# Patient Record
Sex: Female | Born: 1945 | Race: Black or African American | Hispanic: No | Smoking: Never smoker
Health system: Southern US, Community
[De-identification: ages and names within clinical notes are randomized; demographics above are authoritative.]

## PROBLEM LIST (undated history)

## (undated) DIAGNOSIS — N2581 Secondary hyperparathyroidism of renal origin: Secondary | ICD-10-CM

## (undated) DIAGNOSIS — M1990 Unspecified osteoarthritis, unspecified site: Secondary | ICD-10-CM

## (undated) DIAGNOSIS — K219 Gastro-esophageal reflux disease without esophagitis: Secondary | ICD-10-CM

## (undated) DIAGNOSIS — D649 Anemia, unspecified: Secondary | ICD-10-CM

## (undated) DIAGNOSIS — N183 Chronic kidney disease, stage 3 unspecified: Secondary | ICD-10-CM

## (undated) DIAGNOSIS — D631 Anemia in chronic kidney disease: Secondary | ICD-10-CM

## (undated) DIAGNOSIS — M8580 Other specified disorders of bone density and structure, unspecified site: Secondary | ICD-10-CM

## (undated) DIAGNOSIS — N289 Disorder of kidney and ureter, unspecified: Secondary | ICD-10-CM

## (undated) DIAGNOSIS — R1310 Dysphagia, unspecified: Secondary | ICD-10-CM

## (undated) HISTORY — DX: Other specified disorders of bone density and structure, unspecified site: M85.80

## (undated) HISTORY — PX: OTHER SURGICAL HISTORY: SHX169

## (undated) HISTORY — PX: ESOPHAGOGASTRODUODENOSCOPY (EGD) WITH ESOPHAGEAL DILATION: SHX5812

---

## 1999-02-18 ENCOUNTER — Other Ambulatory Visit: Admission: RE | Admit: 1999-02-18 | Discharge: 1999-02-18 | Payer: Self-pay | Admitting: Obstetrics and Gynecology

## 1999-04-01 ENCOUNTER — Ambulatory Visit (HOSPITAL_COMMUNITY): Admission: RE | Admit: 1999-04-01 | Discharge: 1999-04-01 | Payer: Self-pay | Admitting: *Deleted

## 1999-04-01 ENCOUNTER — Encounter: Payer: Self-pay | Admitting: *Deleted

## 1999-04-11 ENCOUNTER — Encounter: Payer: Self-pay | Admitting: Gastroenterology

## 1999-04-11 ENCOUNTER — Ambulatory Visit (HOSPITAL_COMMUNITY): Admission: RE | Admit: 1999-04-11 | Discharge: 1999-04-11 | Payer: Self-pay | Admitting: Gastroenterology

## 2000-01-11 ENCOUNTER — Ambulatory Visit (HOSPITAL_COMMUNITY): Admission: RE | Admit: 2000-01-11 | Discharge: 2000-01-11 | Payer: Self-pay | Admitting: General Surgery

## 2000-01-11 ENCOUNTER — Encounter (INDEPENDENT_AMBULATORY_CARE_PROVIDER_SITE_OTHER): Payer: Self-pay | Admitting: Specialist

## 2000-05-29 ENCOUNTER — Encounter (INDEPENDENT_AMBULATORY_CARE_PROVIDER_SITE_OTHER): Payer: Self-pay | Admitting: Specialist

## 2000-05-29 ENCOUNTER — Ambulatory Visit (HOSPITAL_COMMUNITY): Admission: RE | Admit: 2000-05-29 | Discharge: 2000-05-29 | Payer: Self-pay | Admitting: Gastroenterology

## 2000-07-02 ENCOUNTER — Encounter: Admission: RE | Admit: 2000-07-02 | Discharge: 2000-07-02 | Payer: Self-pay | Admitting: Gastroenterology

## 2000-07-02 ENCOUNTER — Encounter: Payer: Self-pay | Admitting: Gastroenterology

## 2000-11-20 ENCOUNTER — Other Ambulatory Visit: Admission: RE | Admit: 2000-11-20 | Discharge: 2000-11-20 | Payer: Self-pay | Admitting: Obstetrics and Gynecology

## 2006-02-28 ENCOUNTER — Emergency Department (HOSPITAL_COMMUNITY): Admission: EM | Admit: 2006-02-28 | Discharge: 2006-02-28 | Payer: Self-pay | Admitting: Family Medicine

## 2008-09-07 ENCOUNTER — Emergency Department (HOSPITAL_COMMUNITY): Admission: EM | Admit: 2008-09-07 | Discharge: 2008-09-07 | Payer: Self-pay | Admitting: Emergency Medicine

## 2009-10-14 ENCOUNTER — Ambulatory Visit: Payer: Self-pay | Admitting: Obstetrics and Gynecology

## 2009-10-14 ENCOUNTER — Other Ambulatory Visit: Admission: RE | Admit: 2009-10-14 | Discharge: 2009-10-14 | Payer: Self-pay | Admitting: Obstetrics and Gynecology

## 2009-10-15 ENCOUNTER — Encounter: Admission: RE | Admit: 2009-10-15 | Discharge: 2009-10-15 | Payer: Self-pay | Admitting: Obstetrics and Gynecology

## 2009-10-22 ENCOUNTER — Ambulatory Visit: Payer: Self-pay | Admitting: Obstetrics and Gynecology

## 2009-10-26 ENCOUNTER — Ambulatory Visit: Payer: Self-pay | Admitting: Obstetrics and Gynecology

## 2009-11-10 ENCOUNTER — Ambulatory Visit: Payer: Self-pay | Admitting: Obstetrics and Gynecology

## 2009-11-16 ENCOUNTER — Ambulatory Visit: Payer: Self-pay | Admitting: Obstetrics and Gynecology

## 2010-01-11 ENCOUNTER — Ambulatory Visit: Payer: Self-pay | Admitting: Hematology and Oncology

## 2010-01-25 LAB — CBC & DIFF AND RETIC
BASO%: 0 % (ref 0.0–2.0)
Basophils Absolute: 0 10*3/uL (ref 0.0–0.1)
EOS%: 3.8 % (ref 0.0–7.0)
Eosinophils Absolute: 0.1 10*3/uL (ref 0.0–0.5)
HCT: 32.3 % — ABNORMAL LOW (ref 34.8–46.6)
HGB: 10.8 g/dL — ABNORMAL LOW (ref 11.6–15.9)
Immature Retic Fract: 2.3 % (ref 0.00–10.70)
LYMPH%: 28.7 % (ref 14.0–49.7)
MCH: 29.5 pg (ref 25.1–34.0)
MCHC: 33.4 g/dL (ref 31.5–36.0)
MCV: 88.3 fL (ref 79.5–101.0)
MONO#: 0.3 10*3/uL (ref 0.1–0.9)
MONO%: 12.9 % (ref 0.0–14.0)
NEUT#: 1.1 10*3/uL — ABNORMAL LOW (ref 1.5–6.5)
NEUT%: 54.6 % (ref 38.4–76.8)
Platelets: 170 10*3/uL (ref 145–400)
RBC: 3.66 10*6/uL — ABNORMAL LOW (ref 3.70–5.45)
RDW: 13.7 % (ref 11.2–14.5)
Retic %: 0.59 % (ref 0.50–1.50)
Retic Ct Abs: 21.59 10*3/uL (ref 18.30–72.70)
WBC: 2.1 10*3/uL — ABNORMAL LOW (ref 3.9–10.3)
lymph#: 0.6 10*3/uL — ABNORMAL LOW (ref 0.9–3.3)

## 2010-01-25 LAB — MORPHOLOGY: PLT EST: ADEQUATE

## 2010-01-25 LAB — URINALYSIS, MICROSCOPIC - CHCC
Bilirubin (Urine): NEGATIVE
Glucose: NEGATIVE g/dL
Nitrite: NEGATIVE

## 2010-01-31 LAB — COMPREHENSIVE METABOLIC PANEL
AST: 16 U/L (ref 0–37)
Albumin: 4.1 g/dL (ref 3.5–5.2)
Alkaline Phosphatase: 59 U/L (ref 39–117)
BUN: 11 mg/dL (ref 6–23)
Calcium: 9.2 mg/dL (ref 8.4–10.5)
Chloride: 102 mEq/L (ref 96–112)
Glucose, Bld: 85 mg/dL (ref 70–99)
Potassium: 3.7 mEq/L (ref 3.5–5.3)
Sodium: 138 mEq/L (ref 135–145)
Total Protein: 8.8 g/dL — ABNORMAL HIGH (ref 6.0–8.3)

## 2010-01-31 LAB — IGG, IGA, IGM
IgA: 619 mg/dL — ABNORMAL HIGH (ref 68–378)
IgG (Immunoglobin G), Serum: 2850 mg/dL — ABNORMAL HIGH (ref 694–1618)
IgM, Serum: 44 mg/dL — ABNORMAL LOW (ref 60–263)

## 2010-01-31 LAB — PROTEIN ELECTROPHORESIS, SERUM, WITH REFLEX
Alpha-1-Globulin: 3 % (ref 2.9–4.9)
Alpha-2-Globulin: 6 % — ABNORMAL LOW (ref 7.1–11.8)
Beta Globulin: 4.7 % (ref 4.7–7.2)
Total Protein, Serum Electrophoresis: 8.8 g/dL — ABNORMAL HIGH (ref 6.0–8.3)

## 2010-01-31 LAB — IRON AND TIBC
Iron: 53 ug/dL (ref 42–145)
UIBC: 189 ug/dL

## 2010-01-31 LAB — HAPTOGLOBIN: Haptoglobin: 27 mg/dL (ref 16–200)

## 2010-01-31 LAB — HEMOGLOBINOPATHY EVALUATION
Hemoglobin Other: 0 % (ref 0.0–0.0)
Hgb A2 Quant: 2.8 % (ref 2.2–3.2)

## 2010-01-31 LAB — DIRECT ANTIGLOBULIN TEST (NOT AT ARMC)
DAT (Complement): NEGATIVE
DAT IgG: NEGATIVE

## 2010-01-31 LAB — IFE INTERPRETATION

## 2010-01-31 LAB — FOLATE: Folate: 20 ng/mL

## 2010-01-31 LAB — FERRITIN: Ferritin: 213 ng/mL (ref 10–291)

## 2010-08-18 ENCOUNTER — Ambulatory Visit: Payer: Self-pay | Admitting: Hematology and Oncology

## 2010-08-19 LAB — IRON AND TIBC
%SAT: 23 % (ref 20–55)
Iron: 55 ug/dL (ref 42–145)
UIBC: 186 ug/dL

## 2010-08-19 LAB — CBC WITH DIFFERENTIAL/PLATELET
EOS%: 2.8 % (ref 0.0–7.0)
Eosinophils Absolute: 0.1 10*3/uL (ref 0.0–0.5)
LYMPH%: 34.9 % (ref 14.0–49.7)
MCH: 29.5 pg (ref 25.1–34.0)
MCV: 86.8 fL (ref 79.5–101.0)
MONO%: 14.4 % — ABNORMAL HIGH (ref 0.0–14.0)
NEUT#: 1 10*3/uL — ABNORMAL LOW (ref 1.5–6.5)
Platelets: 157 10*3/uL (ref 145–400)
RBC: 3.49 10*6/uL — ABNORMAL LOW (ref 3.70–5.45)
RDW: 12.7 % (ref 11.2–14.5)
nRBC: 0 % (ref 0–0)

## 2010-08-19 LAB — COMPREHENSIVE METABOLIC PANEL
ALT: 10 U/L (ref 0–35)
Alkaline Phosphatase: 66 U/L (ref 39–117)
CO2: 23 mEq/L (ref 19–32)
Creatinine, Ser: 0.77 mg/dL (ref 0.40–1.20)
Glucose, Bld: 79 mg/dL (ref 70–99)
Total Bilirubin: 0.3 mg/dL (ref 0.3–1.2)

## 2010-08-19 LAB — FERRITIN: Ferritin: 181 ng/mL (ref 10–291)

## 2010-10-09 ENCOUNTER — Encounter: Payer: Self-pay | Admitting: Hematology and Oncology

## 2011-02-03 NOTE — Procedures (Signed)
[facility]  Patient:    Deanna Cunningham, Deanna Cunningham                    MRN: [id number] Proc. Date: 05/29/00 Adm. Date:  [id number] Attending:  Charna Elizabeth CC:         Dr. Meliton Rattan   Procedure Report  DATE OF BIRTH:  08-01-1946  REFERRING PHYSICIAN:  PROCEDURE PERFORMED:  Colonoscopy.  ENDOSCOPIST:  Anselmo Rod, M.D.  INSTRUMENT USED:  Olympus video colonoscope.  INDICATIONS FOR PROCEDURE:  Iron deficiency anemia and guaiac positive stools in a 65 year old black female rule out colonic polyps, masses, hemorrhoids, etc.  PREPROCEDURE PREPARATION:  Informed consent was procured from the patient. The patient was fasted for eight hours prior to the procedure and prepped with a bottle of magnesium citrate and a gallon of NuLytely the night prior to the procedure.  PREPROCEDURE PHYSICAL:  The patient had stable vital signs.  Neck supple. hest clear to auscultation.  S1, S2 regular.  Abdomen soft with normal abdominal bowel sounds.  DESCRIPTION OF PROCEDURE:  The patient was placed in the left lateral decubitus position and sedated with 50 mg of Demerol and 5 mg of Versed intravenously.  Once the patient was adequately sedated and maintained on low-flow oxygen and continuous cardiac monitoring, the Olympus video colonoscope was advanced from the rectum to the heptic flexure with extreme difficulty secondary to a large amount of residual stool in the colon with extensive diverticular disease throughout the colon.  There was inspissated stool in several of the diverticular pockets.  No frank masses or polyps were seen; however, the right colon and the cecum were not visualized because of relatively poor prep and the procedure had to be aborted at this point.  The patient had erythematous patches in the rectum of unclear etiology.  These were biopsied for pathology.  IMPRESSION: 1. Extensive diverticulosis throughout the colon up to the hepatic  flexure    with inspissated stool in several of the diverticular pockets. 2. Patchy erythema in the rectum biopsied for pathology. 3. Right colon and cecum not visualized secondary to redundant colon and poor    prep.  RECOMMENDATIONS: 1. Await pathology results. 2. Schedule air contrast barium enema for the patient to complete the    visualization of her colon, especially the right side. 3. Outpatient follow-up in the next two weeks for repeat guaiac testing. DD:  05/29/00 TD:  05/30/00 Job: [id number]

## 2011-06-23 LAB — POCT URINALYSIS DIP (DEVICE)
Glucose, UA: NEGATIVE mg/dL
Nitrite: POSITIVE — AB
Protein, ur: NEGATIVE mg/dL
Specific Gravity, Urine: 1.01 (ref 1.005–1.030)
Urobilinogen, UA: 0.2 mg/dL (ref 0.0–1.0)
pH: 5.5 (ref 5.0–8.0)

## 2011-06-23 LAB — URINE CULTURE

## 2011-06-26 ENCOUNTER — Encounter: Payer: Self-pay | Admitting: Internal Medicine

## 2011-07-19 ENCOUNTER — Ambulatory Visit: Payer: Self-pay | Admitting: Internal Medicine

## 2011-07-20 ENCOUNTER — Other Ambulatory Visit: Payer: Self-pay | Admitting: Gastroenterology

## 2011-08-07 ENCOUNTER — Ambulatory Visit
Admission: RE | Admit: 2011-08-07 | Discharge: 2011-08-07 | Disposition: A | Discharge status: Home / Self Care | Payer: BC Managed Care – PPO | Source: Ambulatory Visit | Attending: Gastroenterology | Admitting: Gastroenterology

## 2011-09-07 ENCOUNTER — Encounter: Payer: Self-pay | Admitting: Obstetrics and Gynecology

## 2013-05-14 ENCOUNTER — Other Ambulatory Visit: Payer: Self-pay | Admitting: Family Medicine

## 2013-05-14 DIAGNOSIS — Z1231 Encounter for screening mammogram for malignant neoplasm of breast: Secondary | ICD-10-CM

## 2013-05-17 ENCOUNTER — Emergency Department (HOSPITAL_COMMUNITY): Payer: BC Managed Care – PPO

## 2013-05-17 ENCOUNTER — Encounter (HOSPITAL_COMMUNITY): Payer: Self-pay

## 2013-05-17 ENCOUNTER — Observation Stay (HOSPITAL_COMMUNITY)
Admission: EM | Admit: 2013-05-17 | Discharge: 2013-05-18 | Disposition: A | Discharge status: Home / Self Care | Payer: BC Managed Care – PPO | Attending: Internal Medicine | Admitting: Internal Medicine

## 2013-05-17 DIAGNOSIS — K219 Gastro-esophageal reflux disease without esophagitis: Secondary | ICD-10-CM | POA: Insufficient documentation

## 2013-05-17 DIAGNOSIS — I4510 Unspecified right bundle-branch block: Secondary | ICD-10-CM

## 2013-05-17 DIAGNOSIS — R55 Syncope and collapse: Principal | ICD-10-CM | POA: Insufficient documentation

## 2013-05-17 DIAGNOSIS — R9431 Abnormal electrocardiogram [ECG] [EKG]: Secondary | ICD-10-CM | POA: Insufficient documentation

## 2013-05-17 DIAGNOSIS — D649 Anemia, unspecified: Secondary | ICD-10-CM | POA: Insufficient documentation

## 2013-05-17 DIAGNOSIS — N179 Acute kidney failure, unspecified: Secondary | ICD-10-CM | POA: Insufficient documentation

## 2013-05-17 DIAGNOSIS — E871 Hypo-osmolality and hyponatremia: Secondary | ICD-10-CM | POA: Insufficient documentation

## 2013-05-17 HISTORY — DX: Syncope and collapse: R55

## 2013-05-17 HISTORY — DX: Unspecified right bundle-branch block: I45.10

## 2013-05-17 LAB — CBC WITH DIFFERENTIAL/PLATELET
Basophils Absolute: 0 10*3/uL (ref 0.0–0.1)
Eosinophils Relative: 4 % (ref 0–5)
Lymphocytes Relative: 14 % (ref 12–46)
Lymphs Abs: 0.5 10*3/uL — ABNORMAL LOW (ref 0.7–4.0)
MCV: 86 fL (ref 78.0–100.0)
Neutrophils Relative %: 71 % (ref 43–77)
Platelets: 211 10*3/uL (ref 150–400)
RBC: 2.86 MIL/uL — ABNORMAL LOW (ref 3.87–5.11)
RDW: 13.9 % (ref 11.5–15.5)
WBC: 3.7 10*3/uL — ABNORMAL LOW (ref 4.0–10.5)

## 2013-05-17 LAB — COMPREHENSIVE METABOLIC PANEL
ALT: 11 U/L (ref 0–35)
AST: 23 U/L (ref 0–37)
Alkaline Phosphatase: 83 U/L (ref 39–117)
CO2: 21 mEq/L (ref 19–32)
Calcium: 9.6 mg/dL (ref 8.4–10.5)
GFR calc non Af Amer: 41 mL/min — ABNORMAL LOW (ref >90)
Potassium: 3.7 mEq/L (ref 3.5–5.1)
Sodium: 131 mEq/L — ABNORMAL LOW (ref 135–145)
Total Protein: 9.5 g/dL — ABNORMAL HIGH (ref 6.0–8.3)

## 2013-05-17 LAB — URINALYSIS, ROUTINE W REFLEX MICROSCOPIC
Bilirubin Urine: NEGATIVE
Glucose, UA: NEGATIVE mg/dL
Hgb urine dipstick: NEGATIVE
Specific Gravity, Urine: 1.004 — ABNORMAL LOW (ref 1.005–1.030)

## 2013-05-17 LAB — PRO B NATRIURETIC PEPTIDE: Pro B Natriuretic peptide (BNP): 260.1 pg/mL — ABNORMAL HIGH (ref 0–125)

## 2013-05-17 LAB — TROPONIN I: Troponin I: <0.3 ng/mL (ref <0.30)

## 2013-05-17 MED ORDER — ONDANSETRON HCL 4 MG PO TABS: 4.0000 mg | ORAL_TABLET | Freq: Four times a day (QID) | ORAL | Status: DC | PRN

## 2013-05-17 MED ORDER — HYDROCODONE-ACETAMINOPHEN 5-325 MG PO TABS: 1.0000 | ORAL_TABLET | ORAL | Status: DC | PRN

## 2013-05-17 MED ORDER — ONDANSETRON HCL 4 MG/2ML IJ SOLN: 4.0000 mg | Freq: Three times a day (TID) | INTRAMUSCULAR | Status: DC | PRN

## 2013-05-17 MED ORDER — DOCUSATE SODIUM 100 MG PO CAPS
100.0000 mg | ORAL_CAPSULE | Freq: Two times a day (BID) | ORAL | Status: DC
  Administered 2013-05-17 – 2013-05-18 (×2): 100 mg via ORAL
  Filled 2013-05-17 (×3): qty 1

## 2013-05-17 MED ORDER — PANTOPRAZOLE SODIUM 40 MG PO TBEC
40.0000 mg | DELAYED_RELEASE_TABLET | Freq: Every day | ORAL | Status: DC
  Administered 2013-05-17 – 2013-05-18 (×2): 40 mg via ORAL
  Filled 2013-05-17 (×2): qty 1

## 2013-05-17 MED ORDER — ONDANSETRON HCL 4 MG/2ML IJ SOLN: 4.0000 mg | Freq: Four times a day (QID) | INTRAMUSCULAR | Status: DC | PRN

## 2013-05-17 MED ORDER — SODIUM CHLORIDE 0.9 % IV SOLN: INTRAVENOUS | Status: DC

## 2013-05-17 MED ORDER — SODIUM CHLORIDE 0.9 % IJ SOLN
3.0000 mL | Freq: Two times a day (BID) | INTRAMUSCULAR | Status: DC
  Administered 2013-05-18: 3 mL via INTRAVENOUS

## 2013-05-17 MED ORDER — SODIUM CHLORIDE 0.9 % IV SOLN
INTRAVENOUS | Status: DC
  Administered 2013-05-17: 1000 mL via INTRAVENOUS
  Administered 2013-05-18: via INTRAVENOUS

## 2013-05-17 MED ORDER — ACETAMINOPHEN 325 MG PO TABS: 650.0000 mg | ORAL_TABLET | Freq: Four times a day (QID) | ORAL | Status: DC | PRN

## 2013-05-17 MED ORDER — SODIUM CHLORIDE 0.9 % IV SOLN
Freq: Once | INTRAVENOUS | Status: AC
  Administered 2013-05-17: 16:00:00 via INTRAVENOUS

## 2013-05-17 MED ORDER — ENOXAPARIN SODIUM 40 MG/0.4ML ~~LOC~~ SOLN
40.0000 mg | SUBCUTANEOUS | Status: DC
  Administered 2013-05-17: 40 mg via SUBCUTANEOUS
  Filled 2013-05-17 (×2): qty 0.4

## 2013-05-17 NOTE — ED Notes (Signed)
Patient transported to CT 

## 2013-05-17 NOTE — H&P (Signed)
[facility] History and Physical  BEYONCE SAWATZKY WUJ:[id number] DOB: 03/15/1946 DOA: 05/17/2013  Referring physician: Dr Glynn Octave PCP: Paulino Rily, MD    Chief Complaint: Syncope  HPI: Deanna Cunningham is a 67 y.o. female with a past medical history anemia, gastroesophageal reflux disease, presented to the emergency department after having a syncopal episode at home. She reports being in her usual state health until today, while washing dishes experienced sudden onset flushed "warm all over", dizziness and lightheadedness  Then had a syncopal episode. She was out for approximately 10 seconds. This event was witnessed by her son. Patient denied chest pain, palpitations, focal neurological deficits, seizure activity, urinary or bowel incontinence. She reports having another syncopal episode approximately 40 years ago. At baseline she states being fairly active, able tolerate physical exertion without developing chest pain or shortness of breath. During my catheter she is asymptomatic and has no complaints. Initial set of cardiac enzymes were negative, EKG showed a right bundle branch block. A CT scan of brain performed and was department showed no acute intracranial abnormalities. Lab work did show a hemoglobin of 8.8. Patient denies bloody stools, melena, hematemesis. She states undergoing GI workup for her anemia approximately 2 years ago which included EGD and colonoscopy reporting that studies came back negative. Otherwise denies denies dizziness, lightheaded, shortness of breath, fatigue prior to today.   Review of Systems: The patient denies anorexia, fever, weight loss,, vision loss, decreased hearing, hoarseness, chest pain, dyspnea on exertion, peripheral edema, balance deficits, hemoptysis, abdominal pain, melena, hematochezia, severe indigestion/heartburn, hematuria, incontinence, genital sores, muscle weakness, suspicious skin lesions, transient blindness, difficulty walking,  depression, unusual weight change, abnormal bleeding, enlarged lymph nodes, angioedema, and breast masses.    History reviewed. No pertinent past medical history. History reviewed. No pertinent past surgical history. Social History:  reports that she has never smoked. She does not have any smokeless tobacco history on file. She reports that she does not drink alcohol or use illicit drugs. She resides locally, works in housekeeping at [employer]   No Known Allergies  No family history on file. Father with history of heart disease and CVA, mother with history of diabetes in congestive heart failure  Prior to Admission medications   Medication Sig Start Date End Date Taking? Authorizing Provider  acetaminophen (TYLENOL) 500 MG tablet Take 1,000 mg by mouth daily as needed for pain.   Yes Historical Provider, MD  cholecalciferol (VITAMIN D) 1000 UNITS tablet Take 1,000 Units by mouth daily.   Yes Historical Provider, MD  ciprofloxacin (CIPRO) 250 MG tablet Take 250 mg by mouth 2 (two) times daily.   Yes Historical Provider, MD  Multiple Vitamins-Minerals (MULTIVITAMIN PO) Take 1 tablet by mouth daily.   Yes Historical Provider, MD  omeprazole (PRILOSEC) 20 MG capsule Take 20 mg by mouth daily.   Yes Historical Provider, MD  trolamine salicylate (ASPERCREME) 10 % cream Apply 1 application topically as needed (for muscle soreness).   Yes Historical Provider, MD   Physical Exam: Filed Vitals:   05/17/13 1550  BP: 134/61  Pulse: 70  Temp:   Resp: 15     General:  Patient is awake alert oriented, pleasant cooperative in no acute distress  Eyes: Pupils are equal round and reactive to light, extraocular movement  ENT: Neck is supple symmetrical  Neck: No jugular venous center  Cardiovascular: Regular rate rhythm normal S1-S2 no murmurs rubs or gallops extremity edema  Respiratory: Lungs are clear to auscultation bilaterally no wheezing  rhonchi oral  Abdomen: Abdomen is soft nontender  nondistended positive bowel sounds in upper quadrant   Skin: No lesions or rashes  Musculoskeletal: Present range of motion to all extremities  Psychiatric: Patient is awake alert and oriented x3  Neurologic: Patient having nonfocal neurologic examination, 5 of 5 muscle strength deep tendon reflexes intact ulceration intact.  Labs on Admission:  Basic Metabolic Panel:  Recent Labs Lab 05/17/13 1350  NA 131*  K 3.7  CL 100  CO2 21  GLUCOSE 83  BUN 25*  CREATININE 1.31*  CALCIUM 9.6   Liver Function Tests:  Recent Labs Lab 05/17/13 1350  AST 23  ALT 11  ALKPHOS 83  BILITOT 0.2*  PROT 9.5*  ALBUMIN 3.5   No results found for this basename: LIPASE, AMYLASE,  in the last 168 hours No results found for this basename: AMMONIA,  in the last 168 hours CBC:  Recent Labs Lab 05/17/13 1350  WBC 3.7*  NEUTROABS 2.6  HGB 8.8*  HCT 24.6*  MCV 86.0  PLT 211   Cardiac Enzymes:  Recent Labs Lab 05/17/13 1350  TROPONINI <0.30    BNP (last 3 results)  Recent Labs  05/17/13 1350  PROBNP 260.1*   CBG: No results found for this basename: GLUCAP,  in the last 168 hours  Radiological Exams on Admission: Dg Chest 2 View  05/17/2013   *RADIOLOGY REPORT*  Clinical Data: Syncope.  CHEST - 2 VIEW  Comparison: None.  Findings: The lungs are clear.  Surgical clips projecting over the upper left chest are noted.  Heart size is normal.  No pneumothorax or pleural fluid.  No focal bony abnormality.  IMPRESSION: No acute disease.   Original Report Authenticated By: Holley Dexter, M.D.   Ct Head Wo Contrast  05/17/2013   *RADIOLOGY REPORT*  Clinical Data: Syncope.  CT HEAD WITHOUT CONTRAST  Technique:  Contiguous axial images were obtained from the base of the skull through the vertex without contrast.  Comparison: No priors.  Findings: Extensive patchy and confluent areas of decreased attenuation throughout the deep and periventricular white matter of the cerebral hemispheres  bilaterally, and probable with advanced chronic microvascular ischemic disease. No acute intracranial abnormalities.  Specifically, no evidence of acute intracranial hemorrhage, no definite findings of acute/subacute cerebral ischemia, no mass, mass effect, hydrocephalus or abnormal intra or extra-axial fluid collections.  Visualized paranasal sinuses and mastoids are well pneumatized.  No acute displaced skull fractures are identified.  IMPRESSION: 1.  No acute intracranial abnormalities. 2.  Chronic microvascular ischemic changes throughout the cerebral white matter, as above.   Original Report Authenticated By: Trudie Reed, M.D.    EKG: RBBB  Assessment/Plan Active Problems:   Syncope and collapse   Right bundle branch block   Chronic anemia   GERD (gastroesophageal reflux disease)  Syncope Patient in her usual state health having a syncopal event this afternoon while washing dishes. Work up in the emergency department included a CT scan of brain showing no acute abnormalities.  Troponin with in normal limits as EKG did not show acute ischemic changes. Orthostatics performed in the emergency department per ER physician were negative as well. BUN and Creatinine could be suggestive of dehydration. Other possibilities include vasovagal. I think its unlikely that her hemoglobin of 8.8 alone would have precipitated syncope. Will place patient in overnight observation, cycle cardiac enzymes, order a TTE, provide IV fluids, supportive care.   Acute Renal Failure Workup in the emergency department showing a BUN of 25  and Creatinine of 1.31, which I suspect is secondary to prerenal azotemia. Will run normal saline at 125 ml per hour. Repeat lab work in AM.   Anemia, acute on chronic Patient reporting a history of anemia, undergoing colonoscopy and EGD in 2012. She denies melena, bloody stools, hematemesis and has not been symptomatic. Will guaiac stools.   Hyponatremia She presents with a sodium  of 131 which could be due to hypotonic hypovolemic hyponatremia. Will administer normal saline, repeat labwork in AM  Gastroesophageal Reflux Disease Will place her on proton pump inhibitor therapy.   DVT Prophylaxis Lovenox 40mg  [place]  Code Status: FULL CODE Family Communication: Plan discussed patient and family Disposition Plan: Placement in overnight obseervation  Time spent: 40  Jeralyn Bennett [facility] Pager [phone]  If 7PM-7AM, please contact night-coverage [email or web address] Password [email or web address] 05/17/2013, 4:51 PM

## 2013-05-17 NOTE — ED Notes (Signed)
Pt. Has a syncopal episode this am denies hitting head.  Denies any chest pain.  Pt. Reports that she felt like she was going to pass out.  Pt. Fell last week and hit the back of her head. Denies any LOC at that time.

## 2013-05-17 NOTE — ED Notes (Signed)
MD at bedside. 

## 2013-05-17 NOTE — ED Notes (Signed)
Pt states that she did not feel lightheaded or dizzy before her syncopal episode. Pt states that she felt "a wave of heat wash over" her, and then she fainted. Pt states that she feels almost normal, however she reports still feeling "off".

## 2013-05-17 NOTE — ED Notes (Signed)
Rancour, MD at bedside. 

## 2013-05-17 NOTE — ED Notes (Signed)
Patient returned from CT

## 2013-05-17 NOTE — ED Provider Notes (Signed)
CSN: [id number]     Arrival date & time 05/17/13  1314 History   First MD Initiated Contact with Patient 05/17/13 1332     Chief Complaint  Patient presents with  . Loss of Consciousness   (Consider location/radiation/quality/duration/timing/severity/associated sxs/prior Treatment) HPI Comments: Patient presents after syncopal episode today. She was standing at the sink and had a "warm feeling" wash over her and fell to the ground. Denies hitting her head. Her son saw her pass out states she was awake when he got to her. She seemed "dazed". She sees Dr. normal now. She denies any focal weakness, numbness or tingling. She denies any chest pain or shortness of breath. She states 10 days ago she tripped over a piece of furniture and hit her head but did not lose consciousness. She denies any headache now. She denies any dizziness, lightheadedness, chest pain or shortness of breath. She states she was a little but warm and lightheaded before she passed out. She is not standing for prolonged period of time.  The history is provided by the patient and a relative.    History reviewed. No pertinent past medical history. History reviewed. No pertinent past surgical history. No family history on file. History  Substance Use Topics  . Smoking status: Never Smoker   . Smokeless tobacco: Not on file  . Alcohol Use: No   OB History   Grav Para Term Preterm Abortions TAB SAB Ect Mult Living                 Review of Systems  Constitutional: Negative for fever, activity change and appetite change.  HENT: Negative for congestion and rhinorrhea.   Respiratory: Negative for cough and shortness of breath.   Cardiovascular: Positive for syncope. Negative for chest pain.  Gastrointestinal: Negative for nausea, vomiting and abdominal pain.  Genitourinary: Negative for dysuria and hematuria.  Musculoskeletal: Negative for back pain.  Neurological: Positive for syncope and light-headedness.  A complete 10  system review of systems was obtained and all systems are negative except as noted in the HPI and PMH.    Allergies  Review of patient's allergies indicates no known allergies.  Home Medications   Current Outpatient Rx  Name  Route  Sig  Dispense  Refill  . acetaminophen (TYLENOL) 500 MG tablet   Oral   Take 1,000 mg by mouth daily as needed for pain.         . cholecalciferol (VITAMIN D) 1000 UNITS tablet   Oral   Take 1,000 Units by mouth daily.         . ciprofloxacin (CIPRO) 250 MG tablet   Oral   Take 250 mg by mouth 2 (two) times daily.         . Multiple Vitamins-Minerals (MULTIVITAMIN PO)   Oral   Take 1 tablet by mouth daily.         Marland Kitchen omeprazole (PRILOSEC) 20 MG capsule   Oral   Take 20 mg by mouth daily.         Marland Kitchen trolamine salicylate (ASPERCREME) 10 % cream   Topical   Apply 1 application topically as needed (for muscle soreness).          BP 134/61  Pulse 70  Temp(Src) 98 F (36.7 C) (Oral)  Resp 15  Ht 5\' 5"  (1.651 m)  Wt 113 lb 9.6 oz (51.529 kg)  BMI 18.9 kg/m2  SpO2 100% Physical Exam  Constitutional: She is oriented to person, place, and time.  She appears well-developed and well-nourished. No distress.  HENT:  Head: Normocephalic and atraumatic.  Mouth/Throat: Oropharynx is clear and moist. No oropharyngeal exudate.  Eyes: Conjunctivae and EOM are normal. Pupils are equal, round, and reactive to light.  Neck: Normal range of motion. Neck supple.  Cardiovascular: Normal rate, regular rhythm and normal heart sounds.   No murmur heard. Pulmonary/Chest: Effort normal and breath sounds normal. No respiratory distress.  Abdominal: Soft. There is no tenderness. There is no rebound and no guarding.  Musculoskeletal: Normal range of motion. She exhibits no edema and no tenderness.  Neurological: She is alert and oriented to person, place, and time. No cranial nerve deficit. She exhibits normal muscle tone. Coordination normal.  CN 2-12  intact, no ataxia on finger to nose, no nystagmus, 5/5 strength throughout, no pronator drift, Romberg negative, normal gait.   Skin: Skin is warm.    ED Course  Procedures (including critical care time) Labs Review Labs Reviewed  CBC WITH DIFFERENTIAL - Abnormal; Notable for the following:    WBC 3.7 (*)    RBC 2.86 (*)    Hemoglobin 8.8 (*)    HCT 24.6 (*)    Lymphs Abs 0.5 (*)    All other components within normal limits  COMPREHENSIVE METABOLIC PANEL - Abnormal; Notable for the following:    Sodium 131 (*)    BUN 25 (*)    Creatinine, Ser 1.31 (*)    Total Protein 9.5 (*)    Total Bilirubin 0.2 (*)    GFR calc non Af Amer 41 (*)    GFR calc Af Amer 48 (*)    All other components within normal limits  PRO B NATRIURETIC PEPTIDE - Abnormal; Notable for the following:    Pro B Natriuretic peptide (BNP) 260.1 (*)    All other components within normal limits  TROPONIN I  URINALYSIS, ROUTINE W REFLEX MICROSCOPIC   Imaging Review Dg Chest 2 View  05/17/2013   *RADIOLOGY REPORT*  Clinical Data: Syncope.  CHEST - 2 VIEW  Comparison: None.  Findings: The lungs are clear.  Surgical clips projecting over the upper left chest are noted.  Heart size is normal.  No pneumothorax or pleural fluid.  No focal bony abnormality.  IMPRESSION: No acute disease.   Original Report Authenticated By: Holley Dexter, M.D.   Ct Head Wo Contrast  05/17/2013   *RADIOLOGY REPORT*  Clinical Data: Syncope.  CT HEAD WITHOUT CONTRAST  Technique:  Contiguous axial images were obtained from the base of the skull through the vertex without contrast.  Comparison: No priors.  Findings: Extensive patchy and confluent areas of decreased attenuation throughout the deep and periventricular white matter of the cerebral hemispheres bilaterally, and probable with advanced chronic microvascular ischemic disease. No acute intracranial abnormalities.  Specifically, no evidence of acute intracranial hemorrhage, no definite  findings of acute/subacute cerebral ischemia, no mass, mass effect, hydrocephalus or abnormal intra or extra-axial fluid collections.  Visualized paranasal sinuses and mastoids are well pneumatized.  No acute displaced skull fractures are identified.  IMPRESSION: 1.  No acute intracranial abnormalities. 2.  Chronic microvascular ischemic changes throughout the cerebral white matter, as above.   Original Report Authenticated By: Trudie Reed, M.D.    MDM   1. Syncope   2. EKG abnormality    Syncopal episode with minimal prodrome. No chest pain or shortness of breath. Nonfocal neuro exam.  EKG shows new right bundle branch block and left anterior fascicular block. CT head negative. Hemoglobin  slightly decreased. Mild increased Creatinine. Given IVF.  Orthostatics negative.  With EKG changes and lack of prodrome, concern for cardiogenic syncope.  Will admit for observation.   Date: 05/17/2013  Rate: 79  Rhythm: normal sinus rhythm  QRS Axis: left  Intervals: normal  ST/T Wave abnormalities: nonspecific ST/T changes  Conduction Disutrbances:right bundle branch block and left anterior fascicular block  Narrative Interpretation:   Old EKG Reviewed: changes noted    Glynn Octave, MD 05/17/13 1630

## 2013-05-18 DIAGNOSIS — I517 Cardiomegaly: Secondary | ICD-10-CM

## 2013-05-18 LAB — BASIC METABOLIC PANEL
BUN: 22 mg/dL (ref 6–23)
Calcium: 8.6 mg/dL (ref 8.4–10.5)
Chloride: 110 mEq/L (ref 96–112)
Creatinine, Ser: 1.26 mg/dL — ABNORMAL HIGH (ref 0.50–1.10)
GFR calc Af Amer: 50 mL/min — ABNORMAL LOW (ref >90)
GFR calc non Af Amer: 43 mL/min — ABNORMAL LOW (ref >90)

## 2013-05-18 LAB — CBC
HCT: 21.2 % — ABNORMAL LOW (ref 36.0–46.0)
MCH: 30.1 pg (ref 26.0–34.0)
MCHC: 34.9 g/dL (ref 30.0–36.0)
MCV: 86.2 fL (ref 78.0–100.0)
RDW: 14 % (ref 11.5–15.5)

## 2013-05-18 LAB — OCCULT BLOOD X 1 CARD TO LAB, STOOL: Fecal Occult Bld: NEGATIVE

## 2013-05-18 LAB — TSH: TSH: 0.717 u[IU]/mL (ref 0.350–4.500)

## 2013-05-18 NOTE — Progress Notes (Signed)
  Echocardiogram 2D Echocardiogram has been performed.  [place] 05/18/2013, 12:26 PM

## 2013-05-18 NOTE — Discharge Summary (Signed)
Physician Discharge Summary  JANIRA MANDELL HQI:[id number] DOB: May 20, 1946 DOA: 05/17/2013  PCP: Paulino Rily, MD  Admit date: 05/17/2013 Discharge date: 05/18/2013  Time spent: 40 minutes  Recommendations for Outpatient Follow-up:  1. Please repeat a CBC on her hospital follow up visit. 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 She had a Hg of 7.4 on the day of discharge, but was asymptomatic, and reported significant improvement with IV fluids.    Discharge Diagnoses:  Active Problems:   Syncope and collapse   Right bundle branch block   Chronic anemia   GERD (gastroesophageal reflux disease)   Discharge Condition: Stable/Improved  Diet recommendation: Health Healthy  Filed Weights   05/17/13 1319 05/17/13 1730  Weight: 51.529 kg (113 lb 9.6 oz) 50.6 kg (111 lb 8.8 oz)    History of present illness:  Deanna Cunningham is a 67 y.o. female with a past medical history anemia, gastroesophageal reflux disease, presented to the emergency department after having a syncopal episode at home. She reports being in her usual state health until today, while washing dishes experienced sudden onset flushed "warm all over", dizziness and lightheadedness Then had a syncopal episode. She was out for approximately 10 seconds. This event was witnessed by her son. Patient denied chest pain, palpitations, focal neurological deficits, seizure activity, urinary or bowel incontinence. She reports having another syncopal episode approximately 40 years ago. At baseline she states being fairly active, able tolerate physical exertion without developing chest pain or shortness of breath. During my catheter she is asymptomatic and has no complaints. Initial set of cardiac enzymes were negative, EKG showed a right bundle branch block. A CT scan of brain  performed and was department showed no acute intracranial abnormalities. Lab work did show a hemoglobin of 8.8. Patient denies bloody stools, melena, hematemesis. She states undergoing GI workup for her anemia approximately 2 years ago which included EGD and colonoscopy reporting that studies came back negative. Otherwise denies denies dizziness, lightheaded, shortness of breath, fatigue prior to today.   Hospital Course:  Mrs. Rigdon is a pleasant 67 year old female with a past medical history of chronic anemia, gastroesophageal reflux disease, presenting to the emergent department on 05/17/2013 after sustaining a syncopal episode at home. She was out for approximately 10 seconds. It was witnessed by her son. She denied chest pain, palpitations, focal neurological deficits prior to syncopal event. She was placed on telemetry for overnight observation as her cardiac enzymes were cycled and remained negative x3 sets. CT scan of brain performed initially in the emergency department showed no acute intracranial abnormality. Given the presence of acute renal failure, hyponatremia, suspected syncope to be secondary to dehydration. Patient reported recently returning from [place], and together with being quite active lately believed that this lead her to become dehydrated. I started her on IV fluids, receive normal saline at 125 ml an hour. By the following day she reported significant improvement. Her family members present at bedside confirmed with me that she looked significantly better. Patient denied dizziness lightheadedness presyncope palpitations and was quite anxious about going home.  I noted a drop in her hemoglobin to 7.4 which could have reflected a hemodilutional effect from IV fluids administration. I spoke to her brother who is a Physiological scientist in [place], reporting that patient has a baseline hemoglobin between 8  and 10. He also reported that patient likely had myelodysplastic syndrome, although  patient tells me that she has refused bone marrow biopsy in the past. She has not had any recent GI bleeds, denying melena bright red blood per rectum hematemesis. She underwent upper endoscopy approximately one year ago. Because of her significant improvement to IV fluids and her desire to go home,  I discharged her on 05/18/13 to follow up closely with her primary care provider early this week.    Procedures:  Transthoracic echocardiogram performed on 05/18/2013 impression: Wall thickness was normal, systolic function normal, estimated ejection fraction was in the 55-60% range. Wall motion was normal. There were no regional wall motion abnormalities. Grade 2 diastolic dysfunction.  Discharge Exam: Filed Vitals:   05/18/13 1138  BP: 149/70  Pulse: 79  Temp: 98.3 F (36.8 C)  Resp: 16    General: Patient is awake alert, quite pleasant, appearing quite energetic during my encounter.  Cardiovascular: Regular rate rhythm normal S1-S2 no murmurs rubs or gout  Respiratory: Lungs are clear to auscultation bilaterally no wheezing rhonchi while Abdomen: Soft nontender nondistended positive bowel sounds Extremities: No cyanosis or edema  Discharge Instructions  Discharge Orders   Future Appointments Provider Department Dept Phone   05/29/2013 2:50 PM [facility]  IMAGING [phone]   Patient should wear two piece clothing and wear no powder or deodorant. Patient should arrive 15 minutes early.   Future Orders Complete By Expires   Call MD for:  difficulty breathing, headache or visual disturbances  As directed    Call MD for:  persistant dizziness or light-headedness  As directed    Diet - low sodium heart healthy  As directed    Discharge instructions  As directed    Comments:     Please follow up with your doctor this week to have a repeat lab draw for your blood counts   Increase activity slowly  As directed        Medication List    STOP taking these  medications       acetaminophen 500 MG tablet  Commonly known as:  TYLENOL     ciprofloxacin 250 MG tablet  Commonly known as:  CIPRO      TAKE these medications       cholecalciferol 1000 UNITS tablet  Commonly known as:  VITAMIN D  Take 1,000 Units by mouth daily.     MULTIVITAMIN PO  Take 1 tablet by mouth daily.     omeprazole 20 MG capsule  Commonly known as:  PRILOSEC  Take 20 mg by mouth daily.     trolamine salicylate 10 % cream  Commonly known as:  ASPERCREME  Apply 1 application topically as needed (for muscle soreness).       No Known Allergies     Follow-up Information   Follow up with Paulino Rily, MD In 4 days.   Specialty:  Family Medicine   Contact information:   [address] [place] [address] [phone]        The results of significant diagnostics from this hospitalization (including imaging, microbiology, ancillary and laboratory) are listed below for reference.    Significant Diagnostic Studies: Dg Chest 2 View  05/17/2013   *RADIOLOGY REPORT*  Clinical Data: Syncope.  CHEST - 2 VIEW  Comparison: None.  Findings: The lungs are clear.  Surgical clips projecting over the upper left chest are noted.  Heart size is normal.  No pneumothorax or  pleural fluid.  No focal bony abnormality.  IMPRESSION: No acute disease.   Original Report Authenticated By: Holley Dexter, M.D.   Ct Head Wo Contrast  05/17/2013   *RADIOLOGY REPORT*  Clinical Data: Syncope.  CT HEAD WITHOUT CONTRAST  Technique:  Contiguous axial images were obtained from the base of the skull through the vertex without contrast.  Comparison: No priors.  Findings: Extensive patchy and confluent areas of decreased attenuation throughout the deep and periventricular white matter of the cerebral hemispheres bilaterally, and probable with advanced chronic microvascular ischemic disease. No acute intracranial abnormalities.  Specifically, no evidence of acute intracranial hemorrhage, no  definite findings of acute/subacute cerebral ischemia, no mass, mass effect, hydrocephalus or abnormal intra or extra-axial fluid collections.  Visualized paranasal sinuses and mastoids are well pneumatized.  No acute displaced skull fractures are identified.  IMPRESSION: 1.  No acute intracranial abnormalities. 2.  Chronic microvascular ischemic changes throughout the cerebral white matter, as above.   Original Report Authenticated By: Trudie Reed, M.D.    Microbiology: No results found for this or any previous visit (from the past 240 hour(s)).   Labs: Basic Metabolic Panel:  Recent Labs Lab 05/17/13 1350 05/17/13 1918 05/18/13 0505  NA 131*  --  136  K 3.7  --  3.5  CL 100  --  110  CO2 21  --  19  GLUCOSE 83  --  88  BUN 25*  --  22  CREATININE 1.31*  --  1.26*  CALCIUM 9.6  --  8.6  MG  --  2.0  --    Liver Function Tests:  Recent Labs Lab 05/17/13 1350  AST 23  ALT 11  ALKPHOS 83  BILITOT 0.2*  PROT 9.5*  ALBUMIN 3.5   No results found for this basename: LIPASE, AMYLASE,  in the last 168 hours No results found for this basename: AMMONIA,  in the last 168 hours CBC:  Recent Labs Lab 05/17/13 1350 05/18/13 0505  WBC 3.7* 2.5*  NEUTROABS 2.6  --   HGB 8.8* 7.4*  HCT 24.6* 21.2*  MCV 86.0 86.2  PLT 211 172   Cardiac Enzymes:  Recent Labs Lab 05/17/13 1350 05/17/13 1918 05/17/13 2314 05/18/13 0505  TROPONINI <0.30 <0.30 <0.30 <0.30   BNP: BNP (last 3 results)  Recent Labs  05/17/13 1350  PROBNP 260.1*   CBG: No results found for this basename: GLUCAP,  in the last 168 hours     Signed:  Jeralyn Bennett  [facility] 05/18/2013, 4:27 PM

## 2013-05-27 ENCOUNTER — Telehealth: Payer: Self-pay

## 2013-05-27 NOTE — Telephone Encounter (Signed)
Left pt vm in ref to np appt. °

## 2013-05-28 ENCOUNTER — Telehealth: Payer: Self-pay | Admitting: Oncology

## 2013-05-28 NOTE — Telephone Encounter (Signed)
C/D 05/28/13 for appt. 9/25

## 2013-05-28 NOTE — Telephone Encounter (Signed)
S/W PT IN REF TO NP APPT. ON 06/12/13@1 :30 REFERRING LACHINA HOLLIS-NP DX-HX OF MYELODYSPLASTIC SYNDROME MAILED NP PACKET

## 2013-05-29 ENCOUNTER — Ambulatory Visit: Payer: BC Managed Care – PPO

## 2013-06-06 ENCOUNTER — Other Ambulatory Visit: Payer: Self-pay | Admitting: Oncology

## 2013-06-06 DIAGNOSIS — D539 Nutritional anemia, unspecified: Secondary | ICD-10-CM

## 2013-06-12 ENCOUNTER — Other Ambulatory Visit (HOSPITAL_BASED_OUTPATIENT_CLINIC_OR_DEPARTMENT_OTHER): Payer: BC Managed Care – PPO | Admitting: Lab

## 2013-06-12 ENCOUNTER — Ambulatory Visit: Payer: BC Managed Care – PPO

## 2013-06-12 ENCOUNTER — Encounter: Payer: Self-pay | Admitting: Oncology

## 2013-06-12 ENCOUNTER — Ambulatory Visit (HOSPITAL_BASED_OUTPATIENT_CLINIC_OR_DEPARTMENT_OTHER): Payer: BC Managed Care – PPO | Admitting: Oncology

## 2013-06-12 ENCOUNTER — Telehealth: Payer: Self-pay | Admitting: Oncology

## 2013-06-12 VITALS — BP 150/71 | HR 90 | Temp 98.5°F | Resp 18 | Ht 65.0 in | Wt 116.4 lb

## 2013-06-12 DIAGNOSIS — D539 Nutritional anemia, unspecified: Secondary | ICD-10-CM

## 2013-06-12 DIAGNOSIS — R55 Syncope and collapse: Secondary | ICD-10-CM

## 2013-06-12 DIAGNOSIS — D649 Anemia, unspecified: Secondary | ICD-10-CM

## 2013-06-12 LAB — COMPREHENSIVE METABOLIC PANEL (CC13)
ALT: 11 U/L (ref 0–55)
Albumin: 3.3 g/dL — ABNORMAL LOW (ref 3.5–5.0)
Alkaline Phosphatase: 88 U/L (ref 40–150)
Glucose: 94 mg/dl (ref 70–140)
Potassium: 4.1 mEq/L (ref 3.5–5.1)
Sodium: 135 mEq/L — ABNORMAL LOW (ref 136–145)
Total Protein: 8.8 g/dL — ABNORMAL HIGH (ref 6.4–8.3)

## 2013-06-12 LAB — CBC WITH DIFFERENTIAL/PLATELET
Eosinophils Absolute: 0.1 10*3/uL (ref 0.0–0.5)
MCV: 90.6 fL (ref 79.5–101.0)
MONO#: 0.4 10*3/uL (ref 0.1–0.9)
MONO%: 13.4 % (ref 0.0–14.0)
NEUT#: 1.7 10*3/uL (ref 1.5–6.5)
RBC: 2.65 10*6/uL — ABNORMAL LOW (ref 3.70–5.45)
RDW: 13.8 % (ref 11.2–14.5)
WBC: 2.8 10*3/uL — ABNORMAL LOW (ref 3.9–10.3)

## 2013-06-12 LAB — HOLD TUBE, BLOOD BANK

## 2013-06-12 NOTE — Progress Notes (Signed)
Note dictated

## 2013-06-12 NOTE — Progress Notes (Signed)
CC:   Knox Royalty, MD  REASON FOR CONSULTATION:  Anemia.  HISTORY OF PRESENT ILLNESS:  This is a pleasant 67 year old [place] American woman, currently of [place].  She has a past medical history significant for GERD and esophageal stricture as well as history of chronic anemia.  Her anemia dates back to at least 2011.  She had a GI workup under the care of Dr. Loreta Ave and at that time, her hemoglobin was 10.5.  She had normal iron studies, and her workup was really unrevealing.  She was evaluated by Dr. Dalene Carrow, and her workup was also unrevealing and recommended a bone marrow biopsy given that her presentation is very suspicious for myelodysplastic syndrome.  Patient failed to show up and refused to have a bone marrow biopsy done and has not really been seen here at the [facility] since December of 2011.  The patient was in her normal state of health and presented on May 17, 2013, with a syncopal episode and a hemoglobin of 7.4.  Her white cell count was 2.5, platelet count was 172.  She was given intravenous fluids for what was presumed to be dehydration and was discharged on August 31st without any complications.  She was referred again for re-evaluation of her anemia.  Clinically, she reports she is feeling a lot better.  She has resumed her work-related duties and has not really had any other syncopal episodes.  She had not reported any hematochezia or melena.  Did not report any chest pain, no shortness of breath.  Did not report any dizziness.  Had not reported any epistaxis. Did not report any major changes in her performance status or activity level.  She has not had any abdominal pain or early satiety.  Did not report any genitourinary complaints.  REVIEW OF SYSTEMS:  A comprehensive review of systems was otherwise unremarkable.  PAST MEDICAL HISTORY:  Significant for a right bundle branch block, history of chronic anemia, history of GERD.  SURGICAL  HISTORY:  She has had a lymph node biopsy in the past that unremarkable.  MEDICATIONS:  She is on vitamin C supplements, multivitamin, omeprazole, __________ 10% cream used as needed.  ALLERGIES:  None.  SOCIAL HISTORY:  She lives with her daughter.  She denied any alcohol or tobacco abuse.  She currently works at [employer].  FAMILY HISTORY:  Reviewed with the patient today and she does endorse history of anemia, although she could not really tell me exactly, but history of a sickle cell or any hemoglobinopathy was unclear.  PHYSICAL EXAMINATION:  Alert, awake, pleasant woman, appeared in no active distress.  Her blood pressure is 150/71, pulse 90, respiration 18, weight 116 is pounds.  Oxygen saturation 100%.  Head: Normocephalic, atraumatic.  Pupils equal and round, reactive to light. Oral mucosa moist and pink.  Neck:  Supple.  No lymphadenopathy.  Heart: Regular rate and rhythm.  S1 and S2.  Lungs:  Clear to auscultation without rhonchi wheeze dullness to percussion.  Abdomen:  Soft, nontender.  No hepatosplenomegaly.  Extremities:  No edema.  LABORATORY DATA:  Hemoglobin of 8.1, white cell count of 2.8, platelet count of 188.  She had a normal differential.  ASSESSMENT AND PLAN:  A 67 year old woman with the following issues: 1. Normocytic, normochromic anemia dating back to at least 2011, even     before that is also a possibility.  Differential diagnosis was     discussed today with Ms. Witter.  It is very likely that  she has     myelodysplastic syndrome, given the chronic nature of her anemia     and the fact that it has been slowly declining.  Despite extensive     GI workup, there is really no evidence to suggest any iron     deficiency or any chronic blood loss.  She had a hemoglobin     electrophoresis 3 years ago, and there was no evidence of any     hemoglobinopathy.  I had a lengthy discussion today with Ms.     Kramme, discussing the natural course of myelodysplastic  syndrome     and how best to diagnose it.  I have offered her again a bone     marrow biopsy, discussed it in detail, the risks and benefits and     the complications associated with it, and the necessity for this     bone marrow biopsy was also explained.  I explained to her that     really to confirm the diagnosis of myelodysplasia,  also for better     stratification of her disease as well as for better prescribing     treatments, it is critical to do so.  I have explained to her that     certain forms of myelodysplastic syndrome can be treated with     Procrit infusions.  Some might require more aggressive treatments     with systemic chemotherapy.  I have also described to her the     possible progression into acute myeloid leukemia, which is more     serious and definitely a life-threatening condition.  Despite all     of that, Ms. Shoe reports that she is feeling well.  She does     not really want any intervention at this point.  She declined a     bone marrow biopsy and has agreed for active surveillance only.  I     will repeat her blood count in about 3 weeks.  I will also arrange     for packed red cell transfusion at that time if needed to.  She     declined that today as well.  All her questions were answered     today, and I hope to continue to follow and assist in her care if     she allows me to. 2. Leukopenia.  Her white cell count was 2.8.  Differential is normal,     and I will continue to monitor this.  This is probably related to     her hematological disorder.    ______________________________ Benjiman Core, M.D. FNS/MEDQ  D:  06/12/2013  T:  06/12/2013  Job:  [id number]

## 2013-06-12 NOTE — Progress Notes (Signed)
Checked in new pt with no financial concerns. °

## 2013-06-12 NOTE — Telephone Encounter (Signed)
Gave pt appt for lab and MD on December 2014 °

## 2013-06-16 ENCOUNTER — Ambulatory Visit
Admission: RE | Admit: 2013-06-16 | Discharge: 2013-06-16 | Disposition: A | Discharge status: Home / Self Care | Payer: BC Managed Care – PPO | Source: Ambulatory Visit | Attending: Internal Medicine | Admitting: Internal Medicine

## 2013-06-16 DIAGNOSIS — Z1231 Encounter for screening mammogram for malignant neoplasm of breast: Secondary | ICD-10-CM

## 2013-07-08 ENCOUNTER — Telehealth: Payer: Self-pay | Admitting: Oncology

## 2013-07-08 NOTE — Telephone Encounter (Signed)
Left vm adv pt rs 12/26 to 12/23 per drs req mailed calendar shh

## 2013-09-02 ENCOUNTER — Telehealth: Payer: Self-pay | Admitting: *Deleted

## 2013-09-02 NOTE — Telephone Encounter (Signed)
Patient calling to cancel her future appts. She will have her pcp monitor her blood work. Doesn't want to go to 2 doctors. Note to dr Alver Fisher desk.

## 2013-09-09 ENCOUNTER — Ambulatory Visit: Payer: BC Managed Care – PPO | Admitting: Oncology

## 2013-09-09 ENCOUNTER — Other Ambulatory Visit: Payer: BC Managed Care – PPO

## 2013-09-12 ENCOUNTER — Other Ambulatory Visit: Payer: BC Managed Care – PPO | Admitting: Lab

## 2013-09-12 ENCOUNTER — Ambulatory Visit: Payer: BC Managed Care – PPO | Admitting: Oncology

## 2014-02-20 ENCOUNTER — Encounter (HOSPITAL_COMMUNITY): Payer: Self-pay | Admitting: *Deleted

## 2014-02-20 ENCOUNTER — Encounter (HOSPITAL_COMMUNITY): Payer: Self-pay | Admitting: Emergency Medicine

## 2014-02-20 ENCOUNTER — Encounter (HOSPITAL_COMMUNITY)
Admission: RE | Disposition: A | Discharge status: Home / Self Care | Payer: Self-pay | Source: Ambulatory Visit | Attending: Gastroenterology

## 2014-02-20 ENCOUNTER — Encounter (HOSPITAL_COMMUNITY): Payer: BC Managed Care – PPO | Admitting: Anesthesiology

## 2014-02-20 ENCOUNTER — Emergency Department (HOSPITAL_COMMUNITY)
Admission: EM | Admit: 2014-02-20 | Discharge: 2014-02-20 | Disposition: A | Discharge status: Home / Self Care | Payer: BC Managed Care – PPO | Attending: Emergency Medicine | Admitting: Emergency Medicine

## 2014-02-20 ENCOUNTER — Ambulatory Visit (HOSPITAL_COMMUNITY): Payer: BC Managed Care – PPO | Admitting: Anesthesiology

## 2014-02-20 ENCOUNTER — Other Ambulatory Visit: Payer: Self-pay | Admitting: Gastroenterology

## 2014-02-20 ENCOUNTER — Ambulatory Visit (HOSPITAL_COMMUNITY)
Admission: RE | Admit: 2014-02-20 | Discharge: 2014-02-20 | Disposition: A | Discharge status: Home / Self Care | Payer: BC Managed Care – PPO | Source: Ambulatory Visit | Attending: Gastroenterology | Admitting: Gastroenterology

## 2014-02-20 DIAGNOSIS — Q391 Atresia of esophagus with tracheo-esophageal fistula: Secondary | ICD-10-CM | POA: Insufficient documentation

## 2014-02-20 DIAGNOSIS — T18108A Unspecified foreign body in esophagus causing other injury, initial encounter: Secondary | ICD-10-CM | POA: Insufficient documentation

## 2014-02-20 DIAGNOSIS — Z79899 Other long term (current) drug therapy: Secondary | ICD-10-CM | POA: Insufficient documentation

## 2014-02-20 DIAGNOSIS — Y9389 Activity, other specified: Secondary | ICD-10-CM | POA: Insufficient documentation

## 2014-02-20 DIAGNOSIS — Q393 Congenital stenosis and stricture of esophagus: Secondary | ICD-10-CM

## 2014-02-20 DIAGNOSIS — Z9889 Other specified postprocedural states: Secondary | ICD-10-CM | POA: Insufficient documentation

## 2014-02-20 DIAGNOSIS — K449 Diaphragmatic hernia without obstruction or gangrene: Secondary | ICD-10-CM | POA: Insufficient documentation

## 2014-02-20 DIAGNOSIS — IMO0002 Reserved for concepts with insufficient information to code with codable children: Secondary | ICD-10-CM | POA: Insufficient documentation

## 2014-02-20 DIAGNOSIS — Y929 Unspecified place or not applicable: Secondary | ICD-10-CM | POA: Insufficient documentation

## 2014-02-20 HISTORY — PX: ESOPHAGOGASTRODUODENOSCOPY: SHX5428

## 2014-02-20 SURGERY — EGD (ESOPHAGOGASTRODUODENOSCOPY)
Anesthesia: General

## 2014-02-20 MED ORDER — GLUCAGON HCL RDNA (DIAGNOSTIC) 1 MG IJ SOLR
1.0000 mg | Freq: Once | INTRAMUSCULAR | Status: AC
  Administered 2014-02-20: 1 mg via INTRAVENOUS
  Filled 2014-02-20: qty 1

## 2014-02-20 MED ORDER — FENTANYL CITRATE 0.05 MG/ML IJ SOLN
INTRAMUSCULAR | Status: AC
  Filled 2014-02-20: qty 2

## 2014-02-20 MED ORDER — SUCCINYLCHOLINE CHLORIDE 20 MG/ML IJ SOLN
INTRAMUSCULAR | Status: DC | PRN
  Administered 2014-02-20: 80 mg via INTRAVENOUS

## 2014-02-20 MED ORDER — FENTANYL CITRATE 0.05 MG/ML IJ SOLN
INTRAMUSCULAR | Status: DC | PRN
  Administered 2014-02-20: 25 ug via INTRAVENOUS

## 2014-02-20 MED ORDER — PROPOFOL 10 MG/ML IV BOLUS
INTRAVENOUS | Status: AC
  Filled 2014-02-20: qty 20

## 2014-02-20 MED ORDER — ONDANSETRON HCL 4 MG/2ML IJ SOLN
INTRAMUSCULAR | Status: AC
  Filled 2014-02-20: qty 2

## 2014-02-20 MED ORDER — LIDOCAINE HCL (CARDIAC) 20 MG/ML IV SOLN
INTRAVENOUS | Status: DC | PRN
  Administered 2014-02-20: 50 mg via INTRAVENOUS

## 2014-02-20 MED ORDER — LIDOCAINE HCL (CARDIAC) 20 MG/ML IV SOLN
INTRAVENOUS | Status: AC
  Filled 2014-02-20: qty 5

## 2014-02-20 MED ORDER — ONDANSETRON HCL 4 MG/2ML IJ SOLN
INTRAMUSCULAR | Status: DC | PRN
  Administered 2014-02-20: 4 mg via INTRAVENOUS

## 2014-02-20 MED ORDER — LACTATED RINGERS IV SOLN
INTRAVENOUS | Status: DC | PRN
Start: 2014-02-20 — End: 2014-02-20
  Administered 2014-02-20: 17:00:00 via INTRAVENOUS

## 2014-02-20 MED ORDER — SODIUM CHLORIDE 0.9 % IV SOLN: INTRAVENOUS | Status: DC

## 2014-02-20 MED ORDER — PROPOFOL 10 MG/ML IV BOLUS
INTRAVENOUS | Status: DC | PRN
  Administered 2014-02-20: 80 mg via INTRAVENOUS

## 2014-02-20 NOTE — Transfer of Care (Signed)
Immediate Anesthesia Transfer of Care Note  Patient: Deanna Cunningham  Procedure(s) Performed: Procedure(s) (LRB): ESOPHAGOGASTRODUODENOSCOPY (EGD) (N/A)  Patient Location: PACU  Anesthesia Type: General  Level of Consciousness: sedated, patient cooperative and responds to stimulation  Airway & Oxygen Therapy: Patient Spontanous Breathing and Patient connected to face mask oxgen  Post-op Assessment: Report given to PACU RN and Post -op Vital signs reviewed and stable  Post vital signs: Reviewed and stable  Complications: No apparent anesthesia complications

## 2014-02-20 NOTE — Op Note (Signed)
[facility] [place], [address]   OPERATIVE PROCEDURE REPORT  PATIENT: Deanna Cunningham, Deanna Cunningham  MR#: [id number] BIRTHDATE: 1946-02-12  GENDER: Female ENDOSCOPIST: Carol Ada, MD ASSISTANT:   Sharon Mt, Endo Technician and Cleda Daub, RN CGRN PROCEDURE DATE: 02/20/2014 PROCEDURE:   EGD with balloon dilation ASA CLASS:   Class III INDICATIONS: Food impaction MEDICATIONS: General anesthesia TOPICAL ANESTHETIC:   none  DESCRIPTION OF PROCEDURE:   After the risks benefits and alternatives of the procedure were thoroughly explained, informed consent was obtained.  The Pentax Gastroscope [id number]  endoscope was introduced through the mouth  and advanced to the second portion of the duodenum Without limitations.      The instrument was slowly withdrawn as the mucosa was fully examined.   FINDINGS: No food particles were noted around the ET tube.  The endoscope was advanced through the UES and immediately the meat bolus was encountered.  Wtih gentle manipulation the bolus was able to be pushed past the stricture.  All of the food bolus was able to be pushed and washed into the gastric lumen.  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  Balloon dilation was performed using the 10-12 mm balloon, but there was balloon moved freely.  A 12-15 mm balloon was then employed and even at the 15 mm diameter the balloon did not stretch the web.  No further manipulation was performed. In the distal esophagus a nonobstructive Schatzki's ring was identified in the setting of a 3 cm hiatal hernia.  No other abnormalities were noted in the gastric or duodenal lumen.          The scope was then withdrawn from the patient and the procedure terminated. COMPLICATIONS: There were no complications.  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 2) Hiatal hernia. 3) Nonobstructive Schatzki's ring.  RECOMMENDATIONS: 1) Chew food well and until it is liquified. 2)  Esophagram. 3) ENT consultation. _______________________________ eSignedCarol Ada, MD 02/20/2014 5:55 PM

## 2014-02-20 NOTE — Discharge Instructions (Signed)
Return to the ED with any concerns including difficulty breathing, vomiting and not able to keep down liquids, decreased level of alertness/lethargy, or any other alarming symptoms  You should go directly to Dr. Lorie Apley office from the ED.  She is expecting you and will do an endoscopy to remove the food.  You should not eat or drink anything.

## 2014-02-20 NOTE — H&P (Signed)
   Deanna Cunningham HPI: While eating chicken noodle soup yesterday the patient experienced acute dysphagia.  She feels that chicken is lodged in her throat.  She has a hard time managing her secretions.  In 2012 Dr. Collene Mares performed an EGD with dilation of the lower esophagus.  Dr. Collene Mares attempted an EGD today only to find that her food impaction was in the upper esophagus.  There is a history of GERD and she takes Prilosec.  History reviewed. No pertinent past medical history.  Past Surgical History  Procedure Laterality Date  . Arthritis    . Esophagogastroduodenoscopy (egd) with esophageal dilation      History reviewed. No pertinent family history.  Social History:  reports that she has never smoked. She does not have any smokeless tobacco history on file. She reports that she does not drink alcohol or use illicit drugs.  Allergies: No Known Allergies  Medications:  Scheduled:  Continuous: . sodium chloride      No results found for this or any previous visit (from the past 24 hour(s)).   No results found.  ROS:  As stated above in the HPI otherwise negative.  There were no vitals taken for this visit.    PE: Gen: NAD, Alert and Oriented HEENT:  [place]/AT, EOMI Neck: Supple, no LAD Lungs: CTA Bilaterally CV: RRR without M/G/R ABM: Soft, NTND, +BS Ext: No C/C/E  Assessment/Plan: 1) Food impaction.  Plan: 1) EGD with anesthesia.  Deanna Cunningham 02/20/2014, 4:32 PM

## 2014-02-20 NOTE — Anesthesia Preprocedure Evaluation (Signed)
Anesthesia Evaluation  Patient identified by MRN, date of birth, ID band Patient awake    Reviewed: Allergy & Precautions, H&P , NPO status , Patient's Chart, lab work & pertinent test results  Airway Mallampati: II TM Distance: >3 FB Neck ROM: Full    Dental  (+) Dental Advisory Given   Pulmonary neg pulmonary ROS,  breath sounds clear to auscultation        Cardiovascular negative cardio ROS  + dysrhythmias Rhythm:Regular Rate:Normal     Neuro/Psych negative neurological ROS  negative psych ROS   GI/Hepatic Neg liver ROS, GERD-  Medicated,  Endo/Other  negative endocrine ROS  Renal/GU negative Renal ROS     Musculoskeletal negative musculoskeletal ROS (+)   Abdominal   Peds  Hematology  (+) anemia ,   Anesthesia Other Findings   Reproductive/Obstetrics negative OB ROS                           Anesthesia Physical Anesthesia Plan  ASA: II and emergent  Anesthesia Plan: General   Post-op Pain Management:    Induction: Intravenous and Rapid sequence  Airway Management Planned: Oral ETT  Additional Equipment:   Intra-op Plan:   Post-operative Plan: Extubation in OR  Informed Consent: I have reviewed the patients History and Physical, chart, labs and discussed the procedure including the risks, benefits and alternatives for the proposed anesthesia with the patient or authorized representative who has indicated his/her understanding and acceptance.   Dental advisory given  Plan Discussed with: CRNA  Anesthesia Plan Comments:         Anesthesia Quick Evaluation

## 2014-02-20 NOTE — ED Provider Notes (Signed)
CSN: [id number]     Arrival date & time 02/20/14  1024 History   First MD Initiated Contact with Patient 02/20/14 1143     Chief Complaint  Patient presents with  . Aspiration     (Consider location/radiation/quality/duration/timing/severity/associated sxs/prior Treatment) HPI Pt presenting with c/o feeling piece of chicken stuck in her throat.  She was eating chicken soup yesterday approx noon.  Felt something lodge in her throat.  She has not been able to eat or drink since that time.  No vomiting.  No difficulty breathing or choking.  She states she has hx of esophageal stricture and has had endoscopies in the past done by GI, Dr. Collene Mares.  Symptoms are constant and unchanged.  No treatment tried prior to arrival. There are no other associated systemic symptoms, there are no other alleviating or modifying factors.   History reviewed. No pertinent past medical history. History reviewed. No pertinent past surgical history. History reviewed. No pertinent family history. History  Substance Use Topics  . Smoking status: Never Smoker   . Smokeless tobacco: Not on file  . Alcohol Use: No   OB History   Grav Para Term Preterm Abortions TAB SAB Ect Mult Living                 Review of Systems ROS reviewed and all otherwise negative except for mentioned in HPI    Allergies  Review of patient's allergies indicates no known allergies.  Home Medications   Prior to Admission medications   Medication Sig Start Date End Date Taking? Authorizing Provider  cholecalciferol (VITAMIN D) 1000 UNITS tablet Take 1,000 Units by mouth daily.   Yes Historical Provider, MD  folic acid (FOLVITE) 1 MG tablet Take 1 mg by mouth daily.  01/30/14  Yes Historical Provider, MD  ibuprofen (ADVIL,MOTRIN) 200 MG tablet Take 400 mg by mouth every 6 (six) hours as needed for mild pain or moderate pain.   Yes Historical Provider, MD  Multiple Vitamins-Minerals (MULTIVITAMIN PO) Take 1 tablet by mouth daily.   Yes  Historical Provider, MD  omeprazole (PRILOSEC) 20 MG capsule Take 20 mg by mouth daily.   Yes Historical Provider, MD  trolamine salicylate (ASPERCREME) 10 % cream Apply 1 application topically as needed (for muscle soreness).   Yes Historical Provider, MD   BP 136/67  Pulse 93  Temp(Src) 98.9 F (37.2 C)  Resp 16  SpO2 91% Vitals reviewed Physical Exam Physical Examination: General appearance - alert, well appearing, and in no distress Mental status - alert, oriented to person, place, and time Eyes - no conjunctival injection, no scleral icterus Mouth - mucous membranes moist, pharynx normal without lesions Chest - clear to auscultation, no wheezes, rales or rhonchi, symmetric air entry Heart - normal rate, regular rhythm, normal S1, S2, no murmurs, rubs, clicks or gallops Abdomen - soft, nontender, nondistended, no masses or organomegaly Extremities - peripheral pulses normal, no pedal edema, no clubbing or cyanosis Skin - normal coloration and turgor, no rashes  ED Course  Procedures (including critical care time)  1:06 PM pt feeling worse after glucagon, she is not able to swallow water still.  D/w Dr. Collene Mares.  Dr. Collene Mares is in endoscopy suite today at her office.  Requests the patient to come directly there for endoscopy.   Labs Review Labs Reviewed - No data to display  Imaging Review No results found.   EKG Interpretation None      MDM   Final diagnoses:  Foreign body  in esophagus    Pt presenting with c/o foreign body/food stuck in her esophagus.  She has hx of esophageal stricture.  Not able to swallow liquids,  No breathing difficulty.  After glucagon there was no relief, pt states there is more pain with attempts to swallow.  D/w Dr. Collene Mares, patient to go directly to her office for endoscopy.  Discharged with strict return precautions.  Pt agreeable with plan.    Threasa Beards, MD 02/20/14 1330

## 2014-02-20 NOTE — ED Notes (Signed)
Pt reports that she was eating some chicken noodle soup yesterday and felt like a piece of chicken became stuck in her throat. States that she has a hx of the same, and has had endoscopies in the past. Denies any SOb, airway intact. Reports trouble with drinking.

## 2014-02-20 NOTE — Discharge Instructions (Signed)
Gastrointestinal Endoscopy Care After Refer to this sheet in the next few weeks. These instructions provide you with information on caring for yourself after your procedure. Your caregiver may also give you more specific instructions. Your treatment has been planned according to current medical practices, but problems sometimes occur. Call your caregiver if you have any problems or questions after your procedure. HOME CARE INSTRUCTIONS  If you were given medicine to help you relax (sedative), do not drive, operate machinery, or sign important documents for 24 hours.  Avoid alcohol and hot or warm beverages for the first 24 hours after the procedure.  Only take over-the-counter or prescription medicines for pain, discomfort, or fever as directed by your caregiver. You may resume taking your normal medicines unless your caregiver tells you otherwise. Ask your caregiver when you may resume taking medicines that may cause bleeding, such as aspirin, clopidogrel, or warfarin.  You may return to your normal diet and activities on the day after your procedure, or as directed by your caregiver. Walking may help to reduce any bloated feeling in your abdomen.  Drink enough fluids to keep your urine clear or pale yellow.  You may gargle with salt water if you have a sore throat. SEEK IMMEDIATE MEDICAL CARE IF:  You have severe nausea or vomiting.  You have severe abdominal pain, abdominal cramps that last longer than 6 hours, or abdominal swelling (distention).  You have severe shoulder or back pain.  You have trouble swallowing.  You have shortness of breath, your breathing is shallow, or you are breathing faster than normal.  You have a fever or a rapid heartbeat.  You vomit blood or material that looks like coffee grounds.  You have bloody, black, or tarry stools. MAKE SURE YOU:  Understand these instructions.  Will watch your condition.  Will get help right away if you are not doing  well or get worse. Document Released: 04/18/2004 Document Revised: 03/05/2012 Document Reviewed: 12/05/2011 [facility] Patient Information 2014 [place], [place]. General Anesthesia, Adult, Care After Refer to this sheet in the next few weeks. These instructions provide you with information on caring for yourself after your procedure. Your health care provider may also give you more specific instructions. Your treatment has been planned according to current medical practices, but problems sometimes occur. Call your health care provider if you have any problems or questions after your procedure. WHAT TO EXPECT AFTER THE PROCEDURE After the procedure, it is typical to experience:  Sleepiness.  Nausea and vomiting. HOME CARE INSTRUCTIONS  For the first 24 hours after general anesthesia:  Have a responsible person with you.  Do not drive a car. If you are alone, do not take public transportation.  Do not drink alcohol.  Do not take medicine that has not been prescribed by your health care provider.  Do not sign important papers or make important decisions.  You may resume a normal diet and activities as directed by your health care provider.  Change bandages (dressings) as directed.  If you have questions or problems that seem related to general anesthesia, call the hospital and ask for the anesthetist or anesthesiologist on call. SEEK MEDICAL CARE IF:  You have nausea and vomiting that continue the day after anesthesia.  You develop a rash. SEEK IMMEDIATE MEDICAL CARE IF:   You have difficulty breathing.  You have chest pain.  You have any allergic problems. Document Released: 12/11/2000 Document Revised: 05/07/2013 Document Reviewed: 03/20/2013 [facility] Patient Information 2014 [place], [place].

## 2014-02-20 NOTE — Anesthesia Postprocedure Evaluation (Signed)
  Anesthesia Post-op Note  Anesthesia Post Note  Patient: Deanna Cunningham  Procedure(s) Performed: Procedure(s) (LRB): ESOPHAGOGASTRODUODENOSCOPY (EGD) (N/A)  Anesthesia type: General  Patient location: PACU  Post pain: Pain level controlled  Post assessment: Post-op Vital signs reviewed  Last Vitals:  Filed Vitals:   02/20/14 1900  BP:   Pulse: 80  Temp:   Resp: 14    Post vital signs: Reviewed  Level of consciousness: sedated  Complications: No apparent anesthesia complications

## 2014-02-20 NOTE — ED Notes (Signed)
Patient given more fluid. States that it feels like it is worse and it hurts worse to swallow since the glucagon was given. Pt was hiccupping and had gurgling sounds in her voice after the fluid. No acute distress. No change in lung sounds.

## 2014-03-03 ENCOUNTER — Other Ambulatory Visit: Payer: Self-pay | Admitting: Gastroenterology

## 2014-03-03 DIAGNOSIS — R1310 Dysphagia, unspecified: Secondary | ICD-10-CM

## 2014-03-03 DIAGNOSIS — R198 Other specified symptoms and signs involving the digestive system and abdomen: Secondary | ICD-10-CM

## 2014-03-10 ENCOUNTER — Ambulatory Visit
Admission: RE | Admit: 2014-03-10 | Discharge: 2014-03-10 | Disposition: A | Discharge status: Home / Self Care | Payer: BC Managed Care – PPO | Source: Ambulatory Visit | Attending: Gastroenterology | Admitting: Gastroenterology

## 2014-03-10 DIAGNOSIS — R198 Other specified symptoms and signs involving the digestive system and abdomen: Secondary | ICD-10-CM

## 2014-03-10 DIAGNOSIS — R1310 Dysphagia, unspecified: Secondary | ICD-10-CM

## 2014-03-17 ENCOUNTER — Encounter (HOSPITAL_COMMUNITY): Payer: Self-pay | Admitting: Gastroenterology

## 2015-02-11 ENCOUNTER — Emergency Department (HOSPITAL_COMMUNITY)
Admission: EM | Admit: 2015-02-11 | Discharge: 2015-02-12 | Disposition: A | Discharge status: Home / Self Care | Payer: BC Managed Care – PPO | Attending: Emergency Medicine | Admitting: Emergency Medicine

## 2015-02-11 ENCOUNTER — Encounter (HOSPITAL_COMMUNITY): Payer: Self-pay | Admitting: Emergency Medicine

## 2015-02-11 DIAGNOSIS — R1110 Vomiting, unspecified: Secondary | ICD-10-CM

## 2015-02-11 DIAGNOSIS — R1032 Left lower quadrant pain: Secondary | ICD-10-CM | POA: Diagnosis not present

## 2015-02-11 DIAGNOSIS — Z79899 Other long term (current) drug therapy: Secondary | ICD-10-CM | POA: Diagnosis not present

## 2015-02-11 DIAGNOSIS — R197 Diarrhea, unspecified: Secondary | ICD-10-CM | POA: Diagnosis present

## 2015-02-11 DIAGNOSIS — K219 Gastro-esophageal reflux disease without esophagitis: Secondary | ICD-10-CM | POA: Diagnosis not present

## 2015-02-11 LAB — HEPATIC FUNCTION PANEL
ALBUMIN: 3.3 g/dL — AB (ref 3.5–5.0)
ALT: 13 U/L — ABNORMAL LOW (ref 14–54)
AST: 22 U/L (ref 15–41)
Alkaline Phosphatase: 73 U/L (ref 38–126)
BILIRUBIN TOTAL: 0.2 mg/dL — AB (ref 0.3–1.2)
Bilirubin, Direct: 0.1 mg/dL (ref 0.1–0.5)
Indirect Bilirubin: 0.1 mg/dL — ABNORMAL LOW (ref 0.3–0.9)
TOTAL PROTEIN: 8.5 g/dL — AB (ref 6.5–8.1)

## 2015-02-11 LAB — CBC WITH DIFFERENTIAL/PLATELET
BASOS PCT: 0 % (ref 0–1)
Basophils Absolute: 0 10*3/uL (ref 0.0–0.1)
Eosinophils Absolute: 0.1 10*3/uL (ref 0.0–0.7)
Eosinophils Relative: 2 % (ref 0–5)
HEMATOCRIT: 26.7 % — AB (ref 36.0–46.0)
Hemoglobin: 9 g/dL — ABNORMAL LOW (ref 12.0–15.0)
LYMPHS PCT: 24 % (ref 12–46)
Lymphs Abs: 0.8 10*3/uL (ref 0.7–4.0)
MCH: 29.6 pg (ref 26.0–34.0)
MCHC: 33.7 g/dL (ref 30.0–36.0)
MCV: 87.8 fL (ref 78.0–100.0)
MONO ABS: 0.4 10*3/uL (ref 0.1–1.0)
Monocytes Relative: 13 % — ABNORMAL HIGH (ref 3–12)
NEUTROS ABS: 1.9 10*3/uL (ref 1.7–7.7)
Neutrophils Relative %: 61 % (ref 43–77)
Platelets: 229 10*3/uL (ref 150–400)
RBC: 3.04 MIL/uL — AB (ref 3.87–5.11)
RDW: 13.3 % (ref 11.5–15.5)
WBC: 3.2 10*3/uL — ABNORMAL LOW (ref 4.0–10.5)

## 2015-02-11 LAB — I-STAT CHEM 8, ED
BUN: 24 mg/dL — AB (ref 6–20)
CALCIUM ION: 1.3 mmol/L (ref 1.13–1.30)
Chloride: 109 mmol/L (ref 101–111)
Creatinine, Ser: 1.3 mg/dL — ABNORMAL HIGH (ref 0.44–1.00)
Glucose, Bld: 90 mg/dL (ref 65–99)
HEMATOCRIT: 30 % — AB (ref 36.0–46.0)
Hemoglobin: 10.2 g/dL — ABNORMAL LOW (ref 12.0–15.0)
POTASSIUM: 3.3 mmol/L — AB (ref 3.5–5.1)
SODIUM: 141 mmol/L (ref 135–145)
TCO2: 18 mmol/L (ref 0–100)

## 2015-02-11 LAB — LIPASE, BLOOD: Lipase: 16 U/L — ABNORMAL LOW (ref 22–51)

## 2015-02-11 MED ORDER — SODIUM CHLORIDE 0.9 % IV BOLUS (SEPSIS)
500.0000 mL | Freq: Once | INTRAVENOUS | Status: AC
  Administered 2015-02-11: 500 mL via INTRAVENOUS

## 2015-02-11 MED ORDER — IOHEXOL 300 MG/ML  SOLN
25.0000 mL | INTRAMUSCULAR | Status: AC
  Administered 2015-02-11: 25 mL via ORAL

## 2015-02-11 MED ORDER — SODIUM CHLORIDE 0.9 % IV SOLN
INTRAVENOUS | Status: DC
  Administered 2015-02-11: 22:00:00 via INTRAVENOUS

## 2015-02-11 NOTE — ED Notes (Signed)
Pt reports nausea vomiting and diarrhea since Tuesday. Reports seen by PCP and then it went away but came back today.

## 2015-02-11 NOTE — ED Provider Notes (Signed)
CSN: [id number]     Arrival date & time 02/11/15  1835 History   First MD Initiated Contact with Patient 02/11/15 2141     Chief Complaint  Patient presents with  . Diarrhea  . Emesis     (Consider location/radiation/quality/duration/timing/severity/associated sxs/prior Treatment) HPI Patient had eaten a type of medication some black coffee 9 days ago. After that she had a day of vomiting and diarrhea., The subsequent day she was improving. By the next day she was able to go back to work and felt all right for couple of days. Then, 5 days ago the symptoms recurred. She reports now she's had several episodes of vomiting per day and several episodes of diarrhea per day. She has developed some left lower quadrant pain which she did not initially have. She has not had a fever. She reports 5 pounds of weight loss. She denies pain burning or urgency to urination. She denies prior problems with abdominal pain or recurrence of vomiting or diarrhea. History reviewed. No pertinent past medical history. Past Surgical History  Procedure Laterality Date  . Arthritis    . Esophagogastroduodenoscopy (egd) with esophageal dilation    . Esophagogastroduodenoscopy N/A 02/20/2014    Procedure: ESOPHAGOGASTRODUODENOSCOPY (EGD);  Surgeon: [name], MD;  Location: [facility];  Service: Gastroenterology;  Laterality: N/A;   No family history on file. History  Substance Use Topics  . Smoking status: Never Smoker   . Smokeless tobacco: Not on file  . Alcohol Use: No   OB History    No data available     Review of Systems 10 Systems reviewed and are negative for acute change except as noted in the HPI.   Allergies  Review of patient's allergies indicates no known allergies.  Home Medications   Prior to Admission medications   Medication Sig Start Date End Date Taking? Authorizing Provider  cholecalciferol (VITAMIN D) 1000 UNITS tablet Take 1,000 Units by mouth daily.   Yes Historical Provider, MD   folic acid (FOLVITE) 1 MG tablet Take 1 mg by mouth daily.  01/30/14  Yes Historical Provider, MD  hydroxypropyl methylcellulose / hypromellose (ISOPTO TEARS / GONIOVISC) 2.5 % ophthalmic solution Place 1 drop into both eyes daily.   Yes Historical Provider, MD  ibuprofen (ADVIL,MOTRIN) 200 MG tablet Take 400 mg by mouth every 6 (six) hours as needed for mild pain or moderate pain.   Yes Historical Provider, MD  Multiple Vitamins-Minerals (MULTIVITAMIN PO) Take 1 tablet by mouth daily.   Yes Historical Provider, MD  ranitidine (ZANTAC) 150 MG tablet Take 150 mg by mouth daily.   Yes Historical Provider, MD  ondansetron (ZOFRAN ODT) 4 MG disintegrating tablet 4mg  ODT q4 hours prn nausea/vomit 02/12/15   Charlesetta Shanks, MD  ondansetron (ZOFRAN ODT) 4 MG disintegrating tablet Take 1 tablet (4 mg total) by mouth every 4 (four) hours as needed for nausea or vomiting. 02/12/15   Charlesetta Shanks, MD  pantoprazole (PROTONIX) 20 MG tablet Take 1 tablet (20 mg total) by mouth daily. 02/12/15   Charlesetta Shanks, MD   BP 137/64 mmHg  Pulse 74  Temp(Src) 98.1 F (36.7 C) (Oral)  Resp 18  Ht 5\' 5"  (1.651 m)  Wt 101 lb 9.6 oz (46.085 kg)  BMI 16.91 kg/m2  SpO2 100% Physical Exam  Constitutional: She is oriented to person, place, and time. She appears well-developed and well-nourished.  HENT:  Head: Normocephalic and atraumatic.  Eyes: EOM are normal. Pupils are equal, round, and reactive to light.  Neck: Neck supple.  Cardiovascular: Normal rate, regular rhythm, normal heart sounds and intact distal pulses.   Pulmonary/Chest: Effort normal and breath sounds normal.  Abdominal: Soft. Bowel sounds are normal. She exhibits no distension. There is tenderness.  Left lower quadrant tenderness to palpation. No mass.  Musculoskeletal: Normal range of motion. She exhibits no edema.  Joint changes consistent with rheumatoid arthritis. None are warm or with acute effusion.  Neurological: She is alert and oriented to  person, place, and time. She has normal strength. Coordination normal. GCS eye subscore is 4. GCS verbal subscore is 5. GCS motor subscore is 6.  Skin: Skin is warm, dry and intact.  Psychiatric: She has a normal mood and affect.    ED Course  Procedures (including critical care time) Labs Review Labs Reviewed  CBC WITH DIFFERENTIAL/PLATELET - Abnormal; Notable for the following:    WBC 3.2 (*)    RBC 3.04 (*)    Hemoglobin 9.0 (*)    HCT 26.7 (*)    Monocytes Relative 13 (*)    All other components within normal limits  URINALYSIS, ROUTINE W REFLEX MICROSCOPIC (NOT AT [facility]) - Abnormal; Notable for the following:    Ketones, ur 15 (*)    Protein, ur 30 (*)    Leukocytes, UA SMALL (*)    All other components within normal limits  HEPATIC FUNCTION PANEL - Abnormal; Notable for the following:    Total Protein 8.5 (*)    Albumin 3.3 (*)    ALT 13 (*)    Total Bilirubin 0.2 (*)    Indirect Bilirubin 0.1 (*)    All other components within normal limits  LIPASE, BLOOD - Abnormal; Notable for the following:    Lipase 16 (*)    All other components within normal limits  I-STAT CHEM 8, ED - Abnormal; Notable for the following:    Potassium 3.3 (*)    BUN 24 (*)    Creatinine, Ser 1.30 (*)    Hemoglobin 10.2 (*)    HCT 30.0 (*)    All other components within normal limits  URINE MICROSCOPIC-ADD ON    Imaging Review Ct Abdomen Pelvis Wo Contrast  02/12/2015   CLINICAL DATA:  Nausea, vomiting, diarrhea.  EXAM: CT ABDOMEN AND PELVIS WITHOUT CONTRAST  TECHNIQUE: Multidetector CT imaging of the abdomen and pelvis was performed following the standard protocol without IV contrast.  COMPARISON:  None.  FINDINGS: BODY WALL: No contributory findings.  LOWER CHEST: Patulous esophagus with reflux or poor clearance. Subpleural bilateral posterior costophrenic sulcus fibrotic changes with honeycombing.  ABDOMEN/PELVIS:  Liver: No focal abnormality.  Biliary: No evidence of biliary obstruction or  stone.  Pancreas: Unremarkable.  Spleen: Unremarkable.  Adrenals: Unremarkable.  Kidneys and ureters: Symmetric prominence of the upper urinary collecting system without bladder distention or other obstructive process.  Bladder: Unremarkable.  Reproductive: No pathologic findings.  Bowel: Proximal small bowel is distended but not obstructed or thickened. Question relation to the esophageal findings. There is formed stool throughout the colon. Extensive distal colonic diverticulosis.  Retroperitoneum: No mass or adenopathy.  Peritoneum: No ascites or pneumoperitoneum.  Vascular: No acute abnormality.  OSSEOUS: Lower lumbar facet arthropathy with grade 1 anterolisthesis at L4-5. Osteopenia  IMPRESSION: 1. No acute findings. 2. Large volume of formed stool. 3. Very patulous distal esophagus and bilateral lower lobe fibrotic change. Is there history of scleroderma? 4. Distal colonic diverticulosis.   Electronically Signed   By: Monte Fantasia M.D.   On: 02/12/2015  01:54     EKG Interpretation None      MDM   Final diagnoses:  Left lower quadrant pain  Gastroesophageal reflux disease, esophagitis presence not specified   Exam is nonsurgical. The patient does not have clinical dehydration. CT has ruled out for diverticulitis. Diverticulosis is identified. There is some reflux into the esophagus and fluid in the small bowel. By the patient's history these still sound consistent with probable gastroenteritis. She will be started on a PPI and Zofran for nausea. Patient is counseled on signs and symptoms for which to return and following up with her family doctor for further evaluation.    Charlesetta Shanks, MD 02/12/15 [phone]

## 2015-02-11 NOTE — ED Notes (Signed)
Patient with nausea, vomiting and diarrhea after eating on Tuesday pm.  Patient states she was seen at her PCP for same but the symptoms have subsided.  Patient states that she has lost weight and the vomiting has been worse.  She states that she has lost 5 pounds.

## 2015-02-12 ENCOUNTER — Emergency Department (HOSPITAL_COMMUNITY): Payer: BC Managed Care – PPO

## 2015-02-12 LAB — URINALYSIS, ROUTINE W REFLEX MICROSCOPIC
Bilirubin Urine: NEGATIVE
GLUCOSE, UA: NEGATIVE mg/dL
HGB URINE DIPSTICK: NEGATIVE
KETONES UR: 15 mg/dL — AB
Nitrite: NEGATIVE
PH: 7 (ref 5.0–8.0)
Protein, ur: 30 mg/dL — AB
SPECIFIC GRAVITY, URINE: 1.016 (ref 1.005–1.030)
UROBILINOGEN UA: 1 mg/dL (ref 0.0–1.0)

## 2015-02-12 LAB — URINE MICROSCOPIC-ADD ON

## 2015-02-12 MED ORDER — ONDANSETRON 4 MG PO TBDP: ORAL_TABLET | ORAL | Status: DC

## 2015-02-12 MED ORDER — PANTOPRAZOLE SODIUM 20 MG PO TBEC: 20.0000 mg | DELAYED_RELEASE_TABLET | Freq: Every day | ORAL | Status: DC

## 2015-02-12 MED ORDER — PANTOPRAZOLE SODIUM 20 MG PO TBEC
20.0000 mg | DELAYED_RELEASE_TABLET | Freq: Once | ORAL | Status: DC
  Filled 2015-02-12: qty 1

## 2015-02-12 MED ORDER — LOPERAMIDE HCL 2 MG PO CAPS: 2.0000 mg | ORAL_CAPSULE | Freq: Four times a day (QID) | ORAL | Status: DC | PRN

## 2015-02-12 MED ORDER — ONDANSETRON 4 MG PO TBDP: 4.0000 mg | ORAL_TABLET | ORAL | Status: DC | PRN

## 2015-02-12 NOTE — Discharge Instructions (Signed)
Suspected Viral Gastroenteritis Viral gastroenteritis is also known as stomach flu. This condition affects the stomach and intestinal tract. It can cause sudden diarrhea and vomiting. The illness typically lasts 3 to 8 days. Most people develop an immune response that eventually gets rid of the virus. While this natural response develops, the virus can make you quite ill. CAUSES  Many different viruses can cause gastroenteritis, such as rotavirus or noroviruses. You can catch one of these viruses by consuming contaminated food or water. You may also catch a virus by sharing utensils or other personal items with an infected person or by touching a contaminated surface. SYMPTOMS  The most common symptoms are diarrhea and vomiting. These problems can cause a severe loss of body fluids (dehydration) and a body salt (electrolyte) imbalance. Other symptoms may include:  Fever.  Headache.  Fatigue.  Abdominal pain. DIAGNOSIS  Your caregiver can usually diagnose viral gastroenteritis based on your symptoms and a physical exam. A stool sample may also be taken to test for the presence of viruses or other infections. TREATMENT  This illness typically goes away on its own. Treatments are aimed at rehydration. The most serious cases of viral gastroenteritis involve vomiting so severely that you are not able to keep fluids down. In these cases, fluids must be given through an intravenous line (IV). HOME CARE INSTRUCTIONS   Drink enough fluids to keep your urine clear or pale yellow. Drink small amounts of fluids frequently and increase the amounts as tolerated.  Ask your caregiver for specific rehydration instructions.  Avoid:  Foods high in sugar.  Alcohol.  Carbonated drinks.  Tobacco.  Juice.  Caffeine drinks.  Extremely hot or cold fluids.  Fatty, greasy foods.  Too much intake of anything at one time.  Dairy products until 24 to 48 hours after diarrhea stops.  You may consume  probiotics. Probiotics are active cultures of beneficial bacteria. They may lessen the amount and number of diarrheal stools in adults. Probiotics can be found in yogurt with active cultures and in supplements.  Wash your hands well to avoid spreading the virus.  Only take over-the-counter or prescription medicines for pain, discomfort, or fever as directed by your caregiver. Do not give aspirin to children. Antidiarrheal medicines are not recommended.  Ask your caregiver if you should continue to take your regular prescribed and over-the-counter medicines.  Keep all follow-up appointments as directed by your caregiver. SEEK IMMEDIATE MEDICAL CARE IF:   You are unable to keep fluids down.  You do not urinate at least once every 6 to 8 hours.  You develop shortness of breath.  You notice blood in your stool or vomit. This may look like coffee grounds.  You have abdominal pain that increases or is concentrated in one small area (localized).  You have persistent vomiting or diarrhea.  You have a fever.  The patient is a child younger than 3 months, and he or she has a fever.  The patient is a child older than 3 months, and he or she has a fever and persistent symptoms.  The patient is a child older than 3 months, and he or she has a fever and symptoms suddenly get worse.  The patient is a baby, and he or she has no tears when crying. MAKE SURE YOU:   Understand these instructions.  Will watch your condition.  Will get help right away if you are not doing well or get worse. Document Released: 09/04/2005 Document Revised: 11/27/2011 Document Reviewed:  06/21/2011 [employer] Patient Information 2015 [place], [place]. This information is not intended to replace advice given to you by your health care provider. Make sure you discuss any questions you have with your health care provider.  Abdominal Pain Many things can cause abdominal pain. Usually, abdominal pain is not caused by a  disease and will improve without treatment. It can often be observed and treated at home. Your health care provider will do a physical exam and possibly order blood tests and X-rays to help determine the seriousness of your pain. However, in many cases, more time must pass before a clear cause of the pain can be found. Before that point, your health care provider may not know if you need more testing or further treatment. HOME CARE INSTRUCTIONS  Monitor your abdominal pain for any changes. The following actions may help to alleviate any discomfort you are experiencing:  Only take over-the-counter or prescription medicines as directed by your health care provider.  Do not take laxatives unless directed to do so by your health care provider.  Try a clear liquid diet (broth, tea, or water) as directed by your health care provider. Slowly move to a bland diet as tolerated. SEEK MEDICAL CARE IF:  You have unexplained abdominal pain.  You have abdominal pain associated with nausea or diarrhea.  You have pain when you urinate or have a bowel movement.  You experience abdominal pain that wakes you in the night.  You have abdominal pain that is worsened or improved by eating food.  You have abdominal pain that is worsened with eating fatty foods.  You have a fever. SEEK IMMEDIATE MEDICAL CARE IF:   Your pain does not go away within 2 hours.  You keep throwing up (vomiting).  Your pain is felt only in portions of the abdomen, such as the right side or the left lower portion of the abdomen.  You pass bloody or black tarry stools. MAKE SURE YOU:  Understand these instructions.   Will watch your condition.   Will get help right away if you are not doing well or get worse.  Document Released: 06/14/2005 Document Revised: 09/09/2013 Document Reviewed: 05/14/2013 [facility] Patient Information 2015 [place], [place]. This information is not intended to replace advice given to you by your  health care provider. Make sure you discuss any questions you have with your health care provider.

## 2015-06-15 ENCOUNTER — Other Ambulatory Visit (HOSPITAL_COMMUNITY): Payer: Self-pay | Admitting: Nephrology

## 2015-06-15 DIAGNOSIS — N183 Chronic kidney disease, stage 3 unspecified: Secondary | ICD-10-CM

## 2015-06-18 ENCOUNTER — Other Ambulatory Visit (HOSPITAL_COMMUNITY): Payer: Self-pay | Admitting: *Deleted

## 2015-06-21 ENCOUNTER — Encounter (HOSPITAL_COMMUNITY)
Admission: RE | Admit: 2015-06-21 | Discharge: 2015-06-21 | Disposition: A | Discharge status: Home / Self Care | Payer: BC Managed Care – PPO | Source: Ambulatory Visit | Attending: Nephrology | Admitting: Nephrology

## 2015-06-21 DIAGNOSIS — D509 Iron deficiency anemia, unspecified: Secondary | ICD-10-CM | POA: Diagnosis not present

## 2015-06-21 MED ORDER — SODIUM CHLORIDE 0.9 % IV SOLN
510.0000 mg | Freq: Once | INTRAVENOUS | Status: AC
  Administered 2015-06-21: 510 mg via INTRAVENOUS
  Filled 2015-06-21: qty 17

## 2015-06-21 NOTE — Discharge Instructions (Signed)
°  June 21, 2015  Patient: Deanna Cunningham  Date of Birth: 12-30-1945  Date of Visit: 06/21/2015    To Whom It May Concern:  Deanna Cunningham was seen and treated in our medical day care unit department on 06/21/2015. KIMBERLEY SPEECE  Work on10/01/2015.  Sincerely,  Anette Guarneri RN

## 2015-06-22 ENCOUNTER — Ambulatory Visit (HOSPITAL_COMMUNITY)
Admission: RE | Admit: 2015-06-22 | Discharge: 2015-06-22 | Disposition: A | Discharge status: Home / Self Care | Payer: BC Managed Care – PPO | Source: Ambulatory Visit | Attending: Nephrology | Admitting: Nephrology

## 2015-06-22 DIAGNOSIS — N183 Chronic kidney disease, stage 3 unspecified: Secondary | ICD-10-CM

## 2016-06-06 ENCOUNTER — Other Ambulatory Visit: Payer: Self-pay | Admitting: Nephrology

## 2016-06-06 DIAGNOSIS — E210 Primary hyperparathyroidism: Secondary | ICD-10-CM

## 2016-06-06 DIAGNOSIS — N2581 Secondary hyperparathyroidism of renal origin: Secondary | ICD-10-CM

## 2016-06-06 DIAGNOSIS — N183 Chronic kidney disease, stage 3 unspecified: Secondary | ICD-10-CM

## 2016-06-06 DIAGNOSIS — R600 Localized edema: Secondary | ICD-10-CM

## 2016-06-08 ENCOUNTER — Other Ambulatory Visit (HOSPITAL_COMMUNITY): Payer: Self-pay | Admitting: Nephrology

## 2016-06-08 DIAGNOSIS — M7989 Other specified soft tissue disorders: Principal | ICD-10-CM

## 2016-06-08 DIAGNOSIS — M79605 Pain in left leg: Secondary | ICD-10-CM

## 2016-06-09 ENCOUNTER — Ambulatory Visit (HOSPITAL_COMMUNITY)
Admission: RE | Admit: 2016-06-09 | Discharge: 2016-06-09 | Disposition: A | Discharge status: Home / Self Care | Payer: BC Managed Care – PPO | Source: Ambulatory Visit | Attending: Nephrology | Admitting: Nephrology

## 2016-06-09 DIAGNOSIS — M79602 Pain in left arm: Secondary | ICD-10-CM | POA: Diagnosis not present

## 2016-06-09 DIAGNOSIS — M79605 Pain in left leg: Secondary | ICD-10-CM | POA: Diagnosis not present

## 2016-06-09 DIAGNOSIS — M7989 Other specified soft tissue disorders: Secondary | ICD-10-CM | POA: Insufficient documentation

## 2016-06-09 NOTE — Progress Notes (Signed)
VASCULAR LAB PRELIMINARY  PRELIMINARY  PRELIMINARY  PRELIMINARY  Left lower extremity venous duplex completed.    Preliminary report:  Left:  No evidence of DVT, superficial thrombosis, or Baker's cyst.  Deanna Cunningham, RVT9/22/2017, 4:26 PM

## 2016-11-09 ENCOUNTER — Other Ambulatory Visit (HOSPITAL_COMMUNITY): Payer: Self-pay | Admitting: *Deleted

## 2016-11-10 ENCOUNTER — Ambulatory Visit (HOSPITAL_COMMUNITY)
Admission: RE | Admit: 2016-11-10 | Discharge: 2016-11-10 | Disposition: A | Discharge status: Home / Self Care | Payer: BC Managed Care – PPO | Source: Ambulatory Visit | Attending: Nephrology | Admitting: Nephrology

## 2016-11-10 DIAGNOSIS — M79606 Pain in leg, unspecified: Secondary | ICD-10-CM | POA: Insufficient documentation

## 2016-11-10 DIAGNOSIS — M7989 Other specified soft tissue disorders: Secondary | ICD-10-CM | POA: Insufficient documentation

## 2016-11-10 MED ORDER — SODIUM CHLORIDE 0.9 % IV SOLN
510.0000 mg | INTRAVENOUS | Status: DC
  Administered 2016-11-10: 510 mg via INTRAVENOUS
  Filled 2016-11-10: qty 17

## 2016-11-17 ENCOUNTER — Encounter (HOSPITAL_COMMUNITY)
Admission: RE | Admit: 2016-11-17 | Discharge: 2016-11-17 | Disposition: A | Discharge status: Home / Self Care | Payer: BC Managed Care – PPO | Source: Ambulatory Visit | Attending: Nephrology | Admitting: Nephrology

## 2016-11-17 DIAGNOSIS — N189 Chronic kidney disease, unspecified: Secondary | ICD-10-CM | POA: Insufficient documentation

## 2016-11-17 DIAGNOSIS — D631 Anemia in chronic kidney disease: Secondary | ICD-10-CM | POA: Insufficient documentation

## 2016-11-17 MED ORDER — SODIUM CHLORIDE 0.9 % IV SOLN
510.0000 mg | INTRAVENOUS | Status: AC
  Administered 2016-11-17: 510 mg via INTRAVENOUS
  Filled 2016-11-17: qty 17

## 2017-12-21 ENCOUNTER — Other Ambulatory Visit (HOSPITAL_COMMUNITY): Payer: Self-pay | Admitting: *Deleted

## 2017-12-24 ENCOUNTER — Ambulatory Visit (HOSPITAL_COMMUNITY)
Admission: RE | Admit: 2017-12-24 | Discharge: 2017-12-24 | Disposition: A | Discharge status: Home / Self Care | Payer: BC Managed Care – PPO | Source: Ambulatory Visit | Attending: Nephrology | Admitting: Nephrology

## 2017-12-24 DIAGNOSIS — D631 Anemia in chronic kidney disease: Secondary | ICD-10-CM | POA: Insufficient documentation

## 2017-12-24 DIAGNOSIS — N189 Chronic kidney disease, unspecified: Secondary | ICD-10-CM | POA: Diagnosis not present

## 2017-12-24 MED ORDER — SODIUM CHLORIDE 0.9 % IV SOLN
510.0000 mg | INTRAVENOUS | Status: DC
  Administered 2017-12-24: 510 mg via INTRAVENOUS
  Filled 2017-12-24: qty 17

## 2017-12-31 ENCOUNTER — Ambulatory Visit (HOSPITAL_COMMUNITY)
Admission: RE | Admit: 2017-12-31 | Discharge: 2017-12-31 | Disposition: A | Discharge status: Home / Self Care | Payer: BC Managed Care – PPO | Source: Ambulatory Visit | Attending: Nephrology | Admitting: Nephrology

## 2017-12-31 DIAGNOSIS — D631 Anemia in chronic kidney disease: Secondary | ICD-10-CM | POA: Diagnosis present

## 2017-12-31 MED ORDER — SODIUM CHLORIDE 0.9 % IV SOLN
510.0000 mg | INTRAVENOUS | Status: AC
  Administered 2017-12-31: 14:00:00 510 mg via INTRAVENOUS
  Filled 2017-12-31: qty 17

## 2018-02-13 ENCOUNTER — Other Ambulatory Visit: Payer: Self-pay | Admitting: Family Medicine

## 2018-02-13 DIAGNOSIS — R600 Localized edema: Secondary | ICD-10-CM

## 2018-02-19 ENCOUNTER — Other Ambulatory Visit: Payer: Self-pay | Admitting: Family Medicine

## 2018-02-19 ENCOUNTER — Other Ambulatory Visit: Payer: BC Managed Care – PPO

## 2018-02-19 DIAGNOSIS — I872 Venous insufficiency (chronic) (peripheral): Secondary | ICD-10-CM

## 2018-02-19 DIAGNOSIS — R600 Localized edema: Secondary | ICD-10-CM

## 2018-02-21 ENCOUNTER — Other Ambulatory Visit: Payer: BC Managed Care – PPO

## 2018-02-21 ENCOUNTER — Ambulatory Visit
Admission: RE | Admit: 2018-02-21 | Discharge: 2018-02-21 | Disposition: A | Discharge status: Home / Self Care | Payer: BC Managed Care – PPO | Source: Ambulatory Visit | Attending: Family Medicine | Admitting: Family Medicine

## 2018-02-21 DIAGNOSIS — I872 Venous insufficiency (chronic) (peripheral): Secondary | ICD-10-CM

## 2018-02-21 DIAGNOSIS — R600 Localized edema: Secondary | ICD-10-CM

## 2018-02-21 NOTE — Consult Note (Signed)
Chief Complaint: Patient was seen in consultation today for  Chief Complaint  Patient presents with  . Consult    Edema of RLE   at the request of Jones,Enrico  Referring Physician(s): Jones,Enrico  History of Present Illness: Deanna Cunningham is a 72 y.o. female with a history of intermittent lower extremity edema.  She has had issues with chronic edema involving the left foot and ankle for many years.  More recently, she developed a herniated disc and right lower extremity sciatica.  This required significant rest and time off from work while she healed.  Upon returning to work she began developing intermittent edema of the right foot and ankle.  This has since resolved.  She presents today for evaluation of possible underlying superficial venous insufficiency.  She does have a family history, her brother had a superficial venous insufficiency which required treatment.  Her mother had issues with poor arterial circulation due to diabetes.  She denies visible varicose veins, heavy, aching tired legs.  She is unsure how long the mild hyperpigmentation has been present.  She denies chest pain, shortness of breath or other systemic symptoms at this time.  No past medical history on file.  Past Surgical History:  Procedure Laterality Date  . arthritis    . ESOPHAGOGASTRODUODENOSCOPY N/A 02/20/2014   Procedure: ESOPHAGOGASTRODUODENOSCOPY (EGD);  Surgeon: [name], MD;  Location: [facility];  Service: Gastroenterology;  Laterality: N/A;  . ESOPHAGOGASTRODUODENOSCOPY (EGD) WITH ESOPHAGEAL DILATION      Allergies: Patient has no known allergies.  Medications: Prior to Admission medications   Medication Sig Start Date End Date Taking? Authorizing Provider  ibuprofen (ADVIL,MOTRIN) 200 MG tablet Take 400 mg by mouth every 6 (six) hours as needed for mild pain or moderate pain.   Yes [provider]  ranitidine (ZANTAC) 150 MG tablet Take 150 mg by mouth daily.   Yes  [provider]  cholecalciferol (VITAMIN D) 1000 UNITS tablet Take 1,000 Units by mouth daily.    [provider]  folic acid (FOLVITE) 1 MG tablet Take 1 mg by mouth daily.  01/30/14   [provider]  hydroxypropyl methylcellulose / hypromellose (ISOPTO TEARS / GONIOVISC) 2.5 % ophthalmic solution Place 1 drop into both eyes daily.    [provider]  Multiple Vitamins-Minerals (MULTIVITAMIN PO) Take 1 tablet by mouth daily.    [provider]  ondansetron (ZOFRAN ODT) 4 MG disintegrating tablet 4mg  ODT q4 hours prn nausea/vomit Patient not taking: Reported on 02/21/2018 02/12/15   Charlesetta Shanks, MD  ondansetron (ZOFRAN ODT) 4 MG disintegrating tablet Take 1 tablet (4 mg total) by mouth every 4 (four) hours as needed for nausea or vomiting. Patient not taking: Reported on 02/21/2018 02/12/15   Charlesetta Shanks, MD  pantoprazole (PROTONIX) 20 MG tablet Take 1 tablet (20 mg total) by mouth daily. Patient not taking: Reported on 02/21/2018 02/12/15   Charlesetta Shanks, MD     No family history on file.  Social History   Socioeconomic History  . Marital status: Legally Separated    Spouse name: Not on file  . Number of children: Not on file  . Years of education: Not on file  . Highest education level: Not on file  Occupational History  . Not on file  Social Needs  . Financial resource strain: Not on file  . Food insecurity:    Worry: Not on file    Inability: Not on file  . Transportation needs:  Medical: Not on file    Non-medical: Not on file  Tobacco Use  . Smoking status: Never Smoker  Substance and Sexual Activity  . Alcohol use: No  . Drug use: No  . Sexual activity: Not on file  Lifestyle  . Physical activity:    Days per week: Not on file    Minutes per session: Not on file  . Stress: Not on file  Relationships  . Social connections:    Talks on phone: Not on file    Gets together: Not on file    Attends religious service:  Not on file    Active member of club or organization: Not on file    Attends meetings of clubs or organizations: Not on file    Relationship status: Not on file  Other Topics Concern  . Not on file  Social History Narrative  . Not on file     Review of Systems: A 12 point ROS discussed and pertinent positives are indicated in the HPI above.  All other systems are negative.  Review of Systems  Vital Signs: BP (!) 150/68   Pulse (!) 59   Temp 97.6 F (36.4 C) (Oral)   Resp 15   SpO2 95%   Physical Exam  Constitutional: She is oriented to person, place, and time. She appears well-developed and well-nourished. No distress.  HENT:  Head: Normocephalic and atraumatic.  Eyes: No scleral icterus.  Cardiovascular: Normal rate.  Pulses:      Dorsalis pedis pulses are 2+ on the right side, and 2+ on the left side.  Pulmonary/Chest: Effort normal.  Abdominal: Soft. She exhibits no distension.  Musculoskeletal: She exhibits no edema or tenderness.  Neurological: She is alert and oriented to person, place, and time.  Skin: Skin is warm and dry.  Very mild hyperpigmentation of the bilateral feet and ankles.  No evidence of venous stasis dermatitis, varicose veins or lipodermatosclerosis.  Psychiatric: She has a normal mood and affect. Her behavior is normal.  Nursing note and vitals reviewed.    Imaging: US Venous Img Lower Unilateral Right  Result Date: 02/21/2018 CLINICAL DATA:  72 year old female with intermittent bilateral lower extremity edema. Evaluate for venous insufficiency. EXAM: BILATERAL LOWER EXTREMITY VENOUS DUPLEX ULTRASOUND TECHNIQUE: Gray-scale sonography with graded compression, as well as color Doppler and duplex ultrasound, were performed to evaluate the deep and superficial veins of both lower extremities. Spectral Doppler was utilized to evaluate flow at rest and with distal augmentation maneuvers. A complete superficial venous insufficiency exam was performed in  the reverse Trendelenburg position. I personally supervised the technical portion of the exam. COMPARISON:  None. FINDINGS: RIGHT Deep Venous System: Evaluation of the deep venous system including the common femoral, femoral, profunda femoral, popliteal and calf veins (where visible) demonstrate no evidence of deep venous thrombosis. The vessels are compressible and demonstrate normal respiratory phasicity and response to augmentation. No evidence of deep venous reflux. RIGHT Superficial Venous System: SFJ: Within normal limits GSV Prox Thigh: Within normal limits GSV Mid Thigh: Within normal limits GSV Lower Thigh: Within normal limits GSV Prox Calf: Within normal limits GSV Mid Calf: Within normal limits GSV Distal Calf: Within normal limits SPJ: Within normal limits SSV Prox: Within normal limits SSV Mid: Within normal limits SSV Distal: Within normal limits Other: None. LEFT Deep Venous System: Evaluation of the deep venous system including the common femoral, femoral, profunda femoral, popliteal and calf veins (where visible) demonstrate no evidence of deep venous thrombosis. The vessels  are compressible and demonstrate normal respiratory phasicity and response to augmentation. No evidence of deep venous reflux. LEFT Superficial Venous System: SFJ: Within normal limits GSV Prox Thigh: Within normal limits GSV Mid Thigh: Within normal limits GSV Lower Thigh: Within normal limits GSV Prox Calf: Within normal limits GSV Mid Calf: Within normal limits GSV Distal Calf: Within normal limits SPJ: Within normal limits SSV Prox: Within normal limits SSV Mid: Within normal limits SSV Distal: Within normal limits Other: None IMPRESSION: 1. Essentially normal examination. 2. No evidence of deep or superficial venous thrombosis. 3. No evidence of superficial venous insufficiency. 4. Signed, Criselda Peaches, MD [facility] Radiology Electronically Signed   By: Jacqulynn Cadet M.D.   On: 02/21/2018 14:46   [place] Rad Eval And Mgmt  Result Date: 02/21/2018 Please refer to "Notes" to see consult details.   Labs:  CBC: No results for input(s): WBC, HGB, HCT, PLT in the last 8760 hours.  COAGS: No results for input(s): INR, APTT in the last 8760 hours.  BMP: No results for input(s): NA, K, CL, CO2, GLUCOSE, BUN, CALCIUM, CREATININE, GFRNONAA, GFRAA in the last 8760 hours.  Invalid input(s): CMP  LIVER FUNCTION TESTS: No results for input(s): BILITOT, AST, ALT, ALKPHOS, PROT, ALBUMIN in the last 8760 hours.  TUMOR MARKERS: No results for input(s): AFPTM, CEA, CA199, CHROMGRNA in the last 8760 hours.  Assessment and Plan:  Clinical and level 2 duplex Doppler evaluation demonstrates no evidence of superficial venous insufficiency and no evidence of deep or superficial venous thrombosis.  Patient's intermittent right lower extremity swelling seems to be resolving on its own.  No therapy or intervention at this time.  Thank you for this interesting consult.  I greatly enjoyed meeting HADDIE BRUHL and look forward to participating in their care.  A copy of this report was sent to the requesting provider on this date.  Electronically Signed: Jacqulynn Cadet 02/21/2018, 3:03 PM   I spent a total of  30 Minutes   in face to face in clinical consultation, greater than 50% of which was counseling/coordinating care for lower extremity edema.

## 2018-04-16 ENCOUNTER — Encounter: Payer: Self-pay | Admitting: Obstetrics & Gynecology

## 2018-04-16 ENCOUNTER — Ambulatory Visit: Payer: BC Managed Care – PPO | Admitting: Obstetrics & Gynecology

## 2018-04-16 ENCOUNTER — Other Ambulatory Visit: Payer: Self-pay | Admitting: Obstetrics & Gynecology

## 2018-04-16 VITALS — BP 126/82 | Ht 64.5 in | Wt 97.0 lb

## 2018-04-16 DIAGNOSIS — Z1231 Encounter for screening mammogram for malignant neoplasm of breast: Secondary | ICD-10-CM

## 2018-04-16 DIAGNOSIS — Z780 Asymptomatic menopausal state: Secondary | ICD-10-CM | POA: Diagnosis not present

## 2018-04-16 DIAGNOSIS — Z124 Encounter for screening for malignant neoplasm of cervix: Secondary | ICD-10-CM

## 2018-04-16 DIAGNOSIS — Z13820 Encounter for screening for osteoporosis: Secondary | ICD-10-CM | POA: Diagnosis not present

## 2018-04-16 DIAGNOSIS — Z01419 Encounter for gynecological examination (general) (routine) without abnormal findings: Secondary | ICD-10-CM | POA: Diagnosis not present

## 2018-04-16 DIAGNOSIS — R636 Underweight: Secondary | ICD-10-CM | POA: Diagnosis not present

## 2018-04-16 NOTE — Patient Instructions (Signed)
1. Encounter for routine gynecological examination with Papanicolaou smear of cervix Normal gynecologic exam.  Pap reflex done today.  Breast exam normal.  Patient will schedule a screening mammogram now.  Colonoscopy in 2012.  Health labs with family physician.  2. Menopause present Menopause, well on no hormone replacement therapy.  No postmenopausal bleeding.  3. Underweight Underweight with a BMI of 16.39.  Recommend increase calories/proteins for weight gain.  4. Screening for osteoporosis No recent bone density.  Will schedule bone density here now.  Vitamin D supplements, calcium rich nutrition and regular weightbearing physical activity recommended. - DG Bone Density; Future  Russell, it was a pleasure meeting you today!  I will inform you of your results as soon as they are available.  High-Protein and High-Calorie Diet Eating high-protein and high-calorie foods can help you to gain weight, heal after an injury, and recover after an illness or surgery. What is my plan? The specific amount of daily protein and calories you need depends on:  Your body weight.  The reason this diet is recommended for you.  Generally, a high-protein, high-calorie diet involves:  Eating 250-500 extra calories each day.  Making sure that 10-35% of your daily calories come from protein.  Talk to your health care provider about how much protein and how many calories you need each day. Follow the diet as directed by your health care provider. What do I need to know about this diet?  Ask your health care provider if you should take a nutritional supplement.  Try to eat six small meals each day instead of three large meals.  Eat a balanced diet, including one food that is high in protein at each meal.  Keep nutritious snacks handy, such as nuts, trail mixes, dried fruit, and yogurt.  If you have kidney disease or diabetes, eating too much protein may put extra stress on your kidneys. Talk to  your health care provider if you have either of those conditions. What are some high-protein foods? Grains Quinoa. Bulgur wheat. Vegetables Soybeans. Peas. Meats and Other Protein Sources Beef, pork, and poultry. Fish and seafood. Eggs. Tofu. Textured vegetable protein (TVP). Peanut butter. Nuts and seeds. Dried beans. Protein powders. Dairy Whole milk. Whole-milk yogurt. Powdered milk. Cheese. Yahoo. Eggnog. Beverages High-protein supplement drinks. Soy milk. Other Protein bars. The items listed above may not be a complete list of recommended foods or beverages. Contact your dietitian for more options. What are some high-calorie foods? Grains Pasta. Quick breads. Muffins. Pancakes. Ready-to-eat cereal. Vegetables Vegetables cooked in oil or butter. Fried potatoes. Fruits Dried fruit. Fruit leather. Canned fruit in syrup. Fruit juice. Avocados. Meats and Other Protein Sources Peanut butter. Nuts and seeds. Dairy Heavy cream. Whipped cream. Cream cheese. Sour cream. Ice cream. Custard. Pudding. Beverages Meal-replacement beverages. Nutrition shakes. Fruit juice. Sugar-sweetened soft drinks. Condiments Salad dressing. Mayonnaise. Alfredo sauce. Fruit preserves or jelly. Honey. Syrup. Sweets/Desserts Cake. Cookies. Pie. Pastries. Candy bars. Chocolate. Fats and Oils Butter or margarine. Oil. Gravy. Other Meal-replacement bars. The items listed above may not be a complete list of recommended foods or beverages. Contact your dietitian for more options. What are some tips for including high-protein and high-calorie foods in my diet?  Add whole milk, half-and-half, or heavy cream to cereal, pudding, soup, or hot cocoa.  Add whole milk to instant breakfast drinks.  Add peanut butter to oatmeal or smoothies.  Add powdered milk to baked goods, smoothies, or milkshakes.  Add powdered milk, cream, or butter to  mashed potatoes.  Add cheese to cooked vegetables.  Make  whole-milk yogurt parfaits. Top them with granola, fruit, or nuts.  Add cottage cheese to your fruit.  Add avocados, cheese, or both to sandwiches or salads.  Add meat, poultry, or seafood to rice, pasta, casseroles, salads, and soups.  Use mayonnaise when making egg salad, chicken salad, or tuna salad.  Use peanut butter as a topping for pretzels, celery, or crackers.  Add beans to casseroles, dips, and spreads.  Add pureed beans to sauces and soups.  Replace calorie-free drinks with calorie-containing drinks, such as milk and fruit juice. This information is not intended to replace advice given to you by your health care provider. Make sure you discuss any questions you have with your health care provider. Document Released: 09/04/2005 Document Revised: 02/10/2016 Document Reviewed: 02/17/2014 Elsevier Interactive Patient Education  [employer].

## 2018-04-16 NOTE — Progress Notes (Signed)
Deanna Cunningham [birth year] [id number]   History:    72 y.o. G2P2L2 Widowed.  5 grand-children.  RP:  New patient presenting for annual gyn exam   HPI: Menopause, well on no HRT.  No PMB.  No pelvic pain.  Urine/BMs wnl.  Abstinent.  Breasts wnl.  Health Labs with Fam MD.  BMI 16.39.  Colonoscopy in 2012.  Past medical history,surgical history, family history and social history were all reviewed and documented in the EPIC chart.  Gynecologic History No LMP recorded. Patient is postmenopausal. Contraception: post menopausal status Last Pap: 09/2009. Results were: Negative Last mammogram: 05/2013. Results were: Negative Bone Density: Many years ago Colonoscopy: 2012  Obstetric History OB History  Gravida Para Term Preterm AB Living  2 2       2   SAB TAB Ectopic Multiple Live Births               # Outcome Date GA Lbr Len/2nd Weight Sex Delivery Anes PTL Lv  2 Para           1 Para              ROS: A ROS was performed and pertinent positives and negatives are included in the history.  GENERAL: No fevers or chills. HEENT: No change in vision, no earache, sore throat or sinus congestion. NECK: No pain or stiffness. CARDIOVASCULAR: No chest pain or pressure. No palpitations. PULMONARY: No shortness of breath, cough or wheeze. GASTROINTESTINAL: No abdominal pain, nausea, vomiting or diarrhea, melena or bright red blood per rectum. GENITOURINARY: No urinary frequency, urgency, hesitancy or dysuria. MUSCULOSKELETAL: No joint or muscle pain, no back pain, no recent trauma. DERMATOLOGIC: No rash, no itching, no lesions. ENDOCRINE: No polyuria, polydipsia, no heat or cold intolerance. No recent change in weight. HEMATOLOGICAL: No anemia or easy bruising or bleeding. NEUROLOGIC: No headache, seizures, numbness, tingling or weakness. PSYCHIATRIC: No depression, no loss of interest in normal activity or change in sleep pattern.     Exam:   BP 126/82   Ht 5' 4.5" (1.638 m)   Wt 97 lb (44  kg)   BMI 16.39 kg/m   Body mass index is 16.39 kg/m.  General appearance : Well developed well nourished female. No acute distress HEENT: Eyes: no retinal hemorrhage or exudates,  Neck supple, trachea midline, no carotid bruits, no thyroidmegaly Lungs: Clear to auscultation, no rhonchi or wheezes, or rib retractions  Heart: Regular rate and rhythm, no murmurs or gallops Breast:Examined in sitting and supine position were symmetrical in appearance, no palpable masses or tenderness,  no skin retraction, no nipple inversion, no nipple discharge, no skin discoloration, no axillary or supraclavicular lymphadenopathy Abdomen: no palpable masses or tenderness, no rebound or guarding Extremities: no edema or skin discoloration or tenderness  Pelvic: Vulva: Normal             Vagina: No gross lesions or discharge  Cervix: No gross lesions or discharge.  Pap reflex done  Uterus  AV, normal size, shape and consistency, non-tender and mobile  Adnexa  Without masses or tenderness  Anus: Normal   Assessment/Plan:  72 y.o. female for annual exam   1. Encounter for routine gynecological examination with Papanicolaou smear of cervix Normal gynecologic exam.  Pap reflex done today.  Breast exam normal.  Patient will schedule a screening mammogram now.  Colonoscopy in 2012.  Health labs with family physician.  2. Menopause present Menopause, well on no hormone replacement therapy.  No  postmenopausal bleeding.  3. Underweight Underweight with a BMI of 16.39.  Recommend increase calories/proteins for weight gain.  4. Screening for osteoporosis No recent bone density.  Will schedule bone density here now.  Vitamin D supplements, calcium rich nutrition and regular weightbearing physical activity recommended. - DG Bone Density; Future  Counseling on above issues and coordination of care more than 50% for 20 minutes.  Princess Bruins MD, 2:38 PM 04/16/2018

## 2018-04-17 LAB — PAP IG W/ RFLX HPV ASCU

## 2018-04-18 DIAGNOSIS — M8580 Other specified disorders of bone density and structure, unspecified site: Secondary | ICD-10-CM

## 2018-04-18 HISTORY — DX: Other specified disorders of bone density and structure, unspecified site: M85.80

## 2018-04-29 ENCOUNTER — Other Ambulatory Visit: Payer: Self-pay | Admitting: Gynecology

## 2018-04-29 DIAGNOSIS — Z780 Asymptomatic menopausal state: Secondary | ICD-10-CM

## 2018-05-08 ENCOUNTER — Ambulatory Visit: Payer: BC Managed Care – PPO

## 2018-05-13 ENCOUNTER — Ambulatory Visit (INDEPENDENT_AMBULATORY_CARE_PROVIDER_SITE_OTHER): Payer: BC Managed Care – PPO

## 2018-05-13 ENCOUNTER — Other Ambulatory Visit: Payer: Self-pay | Admitting: Gynecology

## 2018-05-13 DIAGNOSIS — Z780 Asymptomatic menopausal state: Secondary | ICD-10-CM

## 2018-05-13 DIAGNOSIS — M8589 Other specified disorders of bone density and structure, multiple sites: Secondary | ICD-10-CM

## 2018-05-15 ENCOUNTER — Encounter: Payer: Self-pay | Admitting: Gynecology

## 2018-05-27 ENCOUNTER — Ambulatory Visit
Admission: RE | Admit: 2018-05-27 | Discharge: 2018-05-27 | Disposition: A | Discharge status: Home / Self Care | Payer: BC Managed Care – PPO | Source: Ambulatory Visit | Attending: Obstetrics & Gynecology | Admitting: Obstetrics & Gynecology

## 2018-05-27 DIAGNOSIS — Z1231 Encounter for screening mammogram for malignant neoplasm of breast: Secondary | ICD-10-CM

## 2018-07-25 ENCOUNTER — Other Ambulatory Visit: Payer: Self-pay | Admitting: Gastroenterology

## 2018-07-25 DIAGNOSIS — R634 Abnormal weight loss: Secondary | ICD-10-CM

## 2018-08-05 ENCOUNTER — Other Ambulatory Visit: Payer: Self-pay | Admitting: Gastroenterology

## 2018-08-08 ENCOUNTER — Encounter (HOSPITAL_COMMUNITY): Payer: Self-pay | Admitting: Radiology

## 2018-08-08 ENCOUNTER — Ambulatory Visit (HOSPITAL_COMMUNITY)
Admission: RE | Admit: 2018-08-08 | Discharge: 2018-08-08 | Disposition: A | Discharge status: Home / Self Care | Payer: BC Managed Care – PPO | Source: Ambulatory Visit | Attending: Gastroenterology | Admitting: Gastroenterology

## 2018-08-08 DIAGNOSIS — K5730 Diverticulosis of large intestine without perforation or abscess without bleeding: Secondary | ICD-10-CM | POA: Insufficient documentation

## 2018-08-08 DIAGNOSIS — R634 Abnormal weight loss: Secondary | ICD-10-CM | POA: Diagnosis not present

## 2018-08-08 LAB — POCT I-STAT CREATININE: Creatinine, Ser: 1 mg/dL (ref 0.44–1.00)

## 2018-08-08 MED ORDER — SODIUM CHLORIDE (PF) 0.9 % IJ SOLN
INTRAMUSCULAR | Status: AC
  Filled 2018-08-08: qty 50

## 2018-08-08 MED ORDER — IOHEXOL 300 MG/ML  SOLN
100.0000 mL | Freq: Once | INTRAMUSCULAR | Status: AC | PRN
  Administered 2018-08-08: 100 mL via INTRAVENOUS

## 2018-08-13 ENCOUNTER — Other Ambulatory Visit: Payer: Self-pay | Admitting: Gastroenterology

## 2018-08-13 DIAGNOSIS — Z1211 Encounter for screening for malignant neoplasm of colon: Secondary | ICD-10-CM

## 2019-06-25 ENCOUNTER — Encounter: Payer: Self-pay | Admitting: Gynecology

## 2019-09-26 LAB — ABI

## 2019-10-24 ENCOUNTER — Other Ambulatory Visit: Payer: Self-pay | Admitting: Orthopedic Surgery

## 2019-10-24 DIAGNOSIS — M5416 Radiculopathy, lumbar region: Secondary | ICD-10-CM

## 2019-11-04 ENCOUNTER — Telehealth: Payer: Self-pay

## 2019-11-04 NOTE — Telephone Encounter (Signed)
NOTES [place] [phone] SENT REFERRAL TO [place]

## 2019-11-07 ENCOUNTER — Other Ambulatory Visit (HOSPITAL_COMMUNITY): Payer: Self-pay | Admitting: Family Medicine

## 2019-11-07 DIAGNOSIS — I452 Bifascicular block: Secondary | ICD-10-CM

## 2019-11-10 ENCOUNTER — Ambulatory Visit
Admission: RE | Admit: 2019-11-10 | Discharge: 2019-11-10 | Disposition: A | Discharge status: Home / Self Care | Payer: BC Managed Care – PPO | Source: Ambulatory Visit | Attending: Orthopedic Surgery | Admitting: Orthopedic Surgery

## 2019-11-10 ENCOUNTER — Other Ambulatory Visit: Payer: Self-pay

## 2019-11-10 DIAGNOSIS — M5416 Radiculopathy, lumbar region: Secondary | ICD-10-CM

## 2019-11-17 ENCOUNTER — Telehealth (HOSPITAL_COMMUNITY): Payer: Self-pay | Admitting: Family Medicine

## 2019-11-17 NOTE — Telephone Encounter (Signed)
Called patient and cancelled 11/28/19 Echocardiogram due to Pending PA#.  I spoke with Constance Holster at [facility] and she is working on referral thru [facility]? She will call us back when referral is obtained to for [place] to reschedule patient/  LBW 11/17/19 9:38am

## 2019-11-21 ENCOUNTER — Ambulatory Visit (HOSPITAL_COMMUNITY): Payer: BC Managed Care – PPO | Attending: Internal Medicine

## 2019-11-21 ENCOUNTER — Other Ambulatory Visit: Payer: Self-pay

## 2019-11-21 ENCOUNTER — Other Ambulatory Visit (HOSPITAL_COMMUNITY): Payer: BC Managed Care – PPO

## 2019-11-21 ENCOUNTER — Ambulatory Visit: Payer: BC Managed Care – PPO | Admitting: Cardiology

## 2019-11-21 ENCOUNTER — Encounter: Payer: Self-pay | Admitting: Cardiology

## 2019-11-21 VITALS — BP 96/59 | Temp 92.3°F | Ht 64.0 in | Wt 96.0 lb

## 2019-11-21 DIAGNOSIS — E8809 Other disorders of plasma-protein metabolism, not elsewhere classified: Secondary | ICD-10-CM

## 2019-11-21 DIAGNOSIS — R601 Generalized edema: Secondary | ICD-10-CM

## 2019-11-21 DIAGNOSIS — I452 Bifascicular block: Secondary | ICD-10-CM | POA: Insufficient documentation

## 2019-11-21 DIAGNOSIS — R9431 Abnormal electrocardiogram [ECG] [EKG]: Secondary | ICD-10-CM

## 2019-11-21 DIAGNOSIS — R079 Chest pain, unspecified: Secondary | ICD-10-CM | POA: Diagnosis not present

## 2019-11-21 DIAGNOSIS — I071 Rheumatic tricuspid insufficiency: Secondary | ICD-10-CM

## 2019-11-21 DIAGNOSIS — I340 Nonrheumatic mitral (valve) insufficiency: Secondary | ICD-10-CM

## 2019-11-21 NOTE — Progress Notes (Signed)
Cardiology Office Note:    Date:  11/23/2019   ID:  Deanna Cunningham, DOB 05/25/1946, MRN [id number]  PCP:  Kristie Cowman, MD  Cardiologist:  No primary care provider on file.  Electrophysiologist:  None   Referring MD: Kristie Cowman, MD   Chief Complaint  Patient presents with  . Chest Pain    History of Present Illness:    Deanna Cunningham is a 74 y.o. female with a hx of CKD stage III, GERD who is referred by Dr. Ronnald Ramp for chest pain.  She reports that she started having swelling in her legs a few months ago.  Has progressively worsened.  On 3/20 experienced episode of chest pain.  Lasted for 2 hours.  Reports that it occurs a couple days after she received her Covid vaccine.  States that pain is on left side of chest, dull pain, lasted for couple of hours and resolved.  No chest pain since.  She works as a Secretary/administrator, has not reported chest pain while working.  She reports that she has had little appetite and has been losing weight.  Also has been having issues with diarrhea.  Has seen a gastroenterologist, reportedly EGD/colonoscopy unremarkable.  TTE was done today and showed EF 65 to 70%, indeterminant diastolic function, normal RV systolic function, moderate mitral regurgitation, moderate tricuspid regurgitation.   Past Medical History:  Diagnosis Date  . Osteopenia 04/2018   T score -2.0 FRAX 4% / 0.9%    Past Surgical History:  Procedure Laterality Date  . arthritis    . ESOPHAGOGASTRODUODENOSCOPY N/A 02/20/2014   Procedure: ESOPHAGOGASTRODUODENOSCOPY (EGD);  Surgeon: [name], MD;  Location: [facility];  Service: Gastroenterology;  Laterality: N/A;  . ESOPHAGOGASTRODUODENOSCOPY (EGD) WITH ESOPHAGEAL DILATION      Current Medications: Current Meds  Medication Sig  . ranitidine (ZANTAC) 150 MG tablet Take 150 mg by mouth daily.     Allergies:   Patient has no known allergies.   Social History   Socioeconomic History  . Marital status: Legally Separated   Spouse name: Not on file  . Number of children: Not on file  . Years of education: Not on file  . Highest education level: Not on file  Occupational History  . Not on file  Tobacco Use  . Smoking status: Never Smoker  . Smokeless tobacco: Never Used  Substance and Sexual Activity  . Alcohol use: No  . Drug use: No  . Sexual activity: Not Currently    Comment: 1st intercourse- 17, partners- 4, widow.   Other Topics Concern  . Not on file  Social History Narrative  . Not on file   Social Determinants of Health   Financial Resource Strain:   . Difficulty of Paying Living Expenses: Not on file  Food Insecurity:   . Worried About Charity fundraiser in the Last Year: Not on file  . Ran Out of Food in the Last Year: Not on file  Transportation Needs:   . Lack of Transportation (Medical): Not on file  . Lack of Transportation (Non-Medical): Not on file  Physical Activity:   . Days of Exercise per Week: Not on file  . Minutes of Exercise per Session: Not on file  Stress:   . Feeling of Stress : Not on file  Social Connections:   . Frequency of Communication with Friends and Family: Not on file  . Frequency of Social Gatherings with Friends and Family: Not on file  . Attends Religious Services:  Not on file  . Active Member of Clubs or Organizations: Not on file  . Attends Archivist Meetings: Not on file  . Marital Status: Not on file     Family History: The patient's family history includes Cancer in her brother; Congestive Heart Failure in her mother; Diabetes in her brother, mother, and sister; Hypertension in her brother, mother, and sister.  ROS:   Please see the history of present illness.     All other systems reviewed and are negative.  EKGs/Labs/Other Studies Reviewed:    The following studies were reviewed today:   EKG:  EKG is ordered today.  The ekg ordered today demonstrates normal sinus rhythm, rate 93, PACs, right bundle branch block, left  anterior fascicular block  TTE 11/21/18: 1. Left ventricular ejection fraction, by estimation, is 65 to 70%. The  left ventricle has normal function. The left ventricle has no regional  wall motion abnormalities. Left ventricular diastolic parameters are  indeterminate.  2. Right ventricular systolic function is normal. The right ventricular  size is normal. There is mildly elevated pulmonary artery systolic  pressure.  3. MR is eccentric an directed posterior into L atrium . Moderate mitral  valve regurgitation.  4. Tricuspid valve regurgitation is moderate.  5. The aortic valve is tricuspid. Aortic valve regurgitation is not  visualized. Mild aortic valve sclerosis is present, with no evidence of  aortic valve stenosis.  6. The inferior vena cava is normal in size with greater than 50%  respiratory variability, suggesting right atrial pressure of 3 mmHg.   ABI 09/26/2019 [facility]): Normal ABIs  Lower extremity venous duplex 09/26/2019 [facility]): No DVT  Recent Labs: 11/21/2019: BNP 64.7  Recent Lipid Panel No results found for: CHOL, TRIG, HDL, CHOLHDL, VLDL, LDLCALC, LDLDIRECT  Physical Exam:    VS:  BP (!) 96/59   Temp (!) 92.3 F (33.5 C)   Ht 5\' 4"  (1.626 m)   Wt 96 lb (43.5 kg)   BMI 16.48 kg/m     Wt Readings from Last 3 Encounters:  11/21/19 96 lb (43.5 kg)  04/16/18 97 lb (44 kg)  12/31/17 104 lb (47.2 kg)     GEN: Cachectic HEENT: Normal NECK: No JVD CARDIAC: RRR, 2/6 ystolic murmur RESPIRATORY:  Clear to auscultation without rales, wheezing or rhonchi  ABDOMEN: Soft, non-tender, non-distended MUSCULOSKELETAL:  2+ BLE edema SKIN: Warm and dry NEUROLOGIC:  Alert and oriented x 3 PSYCHIATRIC:  Normal affect   ASSESSMENT:    1. Chest pain, unspecified type   2. Abnormal electrocardiogram   3. Anasarca   4. Serum albumin decreased   5. Mitral valve insufficiency, unspecified etiology   6. Tricuspid valve insufficiency,  unspecified etiology    PLAN:     Chest pain: Atypical in description.  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  Will check Lexiscan Myoview to evaluate for ischemia  Moderate mitral regurgitation/tricuspid regurgitation: will monitor with repeat TTE in 1 year  Anasarca: Suspect due to hypoalbuminemia (albumin 2.4 on 11/20/2019).  Lower extremity duplex shows no DVT.  TTE shows moderate MR/TR but small/collapsible IVC, suggesting low right atrial pressure.  Will check BNP.  Has been on Lasix, but discontinued due to hypokalemia (2.9 on 11/20/2019).  Suspect hypoalbuminemia is the primary cause of her anasarca.  Low albumin is likely due to malnutrition.  Will check urinalysis to rule out significant proteinuria.  RTC in 3 months   Medication Adjustments/Labs and Tests Ordered:  Current medicines are reviewed at length with the patient today.  Concerns regarding medicines are outlined above.  Orders Placed This Encounter  Procedures  . Microscopic Examination  . Brain natriuretic peptide  . Urinalysis, Routine w reflex microscopic  . MYOCARDIAL PERFUSION IMAGING  . EKG 12-Lead   No orders of the defined types were placed in this encounter.   Patient Instructions  Medication Instructions:  Your physician recommends that you continue on your current medications as directed. Please refer to the Current Medication list given to you today.  *If you need a refill on your cardiac medications before your next appointment, please call your pharmacy*  Lab Work: BNP and urinalysis today  If you have labs (blood work) drawn today and your tests are completely normal, you will receive your results only by: Marland Kitchen MyChart Message (if you have MyChart) OR . A paper copy in the mail If you have any lab test that is abnormal or we need to change your treatment, we will call you to review the results.   Testing/Procedures: Your physician has requested that you have a lexiscan  myoview. For further information please visit [email or web address]. Please follow instruction sheet, as given.  Follow-Up: At [facility], you and your health needs are our priority.  As part of our continuing mission to provide you with exceptional heart care, we have created designated Provider Care Teams.  These Care Teams include your primary Cardiologist (physician) and Advanced Practice Providers (APPs -  Physician Assistants and Nurse Practitioners) who all work together to provide you with the care you need, when you need it.  We recommend signing up for the patient portal called "MyChart".  Sign up information is provided on this After Visit Summary.  MyChart is used to connect with patients for Virtual Visits (Telemedicine).  Patients are able to view lab/test results, encounter notes, upcoming appointments, etc.  Non-urgent messages can be sent to your provider as well.   To learn more about what you can do with MyChart, go to [email or web address].    Your next appointment:   3 month(s)  The format for your next appointment:   In Person  Provider:   Oswaldo Milian, MD       Signed, Donato Heinz, MD  11/23/2019 1:57 PM    [place]

## 2019-11-21 NOTE — Patient Instructions (Signed)
Medication Instructions:  Your physician recommends that you continue on your current medications as directed. Please refer to the Current Medication list given to you today.  *If you need a refill on your cardiac medications before your next appointment, please call your pharmacy*  Lab Work: BNP and urinalysis today  If you have labs (blood work) drawn today and your tests are completely normal, you will receive your results only by: Marland Kitchen MyChart Message (if you have MyChart) OR . A paper copy in the mail If you have any lab test that is abnormal or we need to change your treatment, we will call you to review the results.   Testing/Procedures: Your physician has requested that you have a lexiscan myoview. For further information please visit [email or web address]. Please follow instruction sheet, as given.  Follow-Up: At [facility], you and your health needs are our priority.  As part of our continuing mission to provide you with exceptional heart care, we have created designated Provider Care Teams.  These Care Teams include your primary Cardiologist (physician) and Advanced Practice Providers (APPs -  Physician Assistants and Nurse Practitioners) who all work together to provide you with the care you need, when you need it.  We recommend signing up for the patient portal called "MyChart".  Sign up information is provided on this After Visit Summary.  MyChart is used to connect with patients for Virtual Visits (Telemedicine).  Patients are able to view lab/test results, encounter notes, upcoming appointments, etc.  Non-urgent messages can be sent to your provider as well.   To learn more about what you can do with MyChart, go to [email or web address].    Your next appointment:   3 month(s)  The format for your next appointment:   In Person  Provider:   Oswaldo Milian, MD

## 2019-11-22 LAB — URINALYSIS, ROUTINE W REFLEX MICROSCOPIC
Bilirubin, UA: NEGATIVE
Glucose, UA: NEGATIVE
Ketones, UA: NEGATIVE
Nitrite, UA: NEGATIVE
RBC, UA: NEGATIVE
Specific Gravity, UA: 1.022 (ref 1.005–1.030)
Urobilinogen, Ur: 0.2 mg/dL (ref 0.2–1.0)
pH, UA: 6.5 (ref 5.0–7.5)

## 2019-11-22 LAB — BRAIN NATRIURETIC PEPTIDE: BNP: 64.7 pg/mL (ref 0.0–100.0)

## 2019-11-22 LAB — MICROSCOPIC EXAMINATION: Casts: NONE SEEN /lpf

## 2019-11-25 ENCOUNTER — Other Ambulatory Visit: Payer: Self-pay | Admitting: *Deleted

## 2019-11-27 ENCOUNTER — Other Ambulatory Visit: Payer: Self-pay | Admitting: *Deleted

## 2019-11-28 ENCOUNTER — Telehealth (HOSPITAL_COMMUNITY): Payer: Self-pay

## 2019-11-28 ENCOUNTER — Other Ambulatory Visit (HOSPITAL_COMMUNITY): Payer: BC Managed Care – PPO

## 2019-11-28 NOTE — Telephone Encounter (Signed)
Encounter complete. 

## 2019-12-02 ENCOUNTER — Telehealth: Payer: Self-pay | Admitting: Cardiology

## 2019-12-02 ENCOUNTER — Telehealth (HOSPITAL_COMMUNITY): Payer: Self-pay

## 2019-12-02 NOTE — Telephone Encounter (Signed)
Encounter complete. 

## 2019-12-02 NOTE — Telephone Encounter (Signed)
Follow up: ° ° ° °Patient returning your call back. Please call patient. °

## 2019-12-03 ENCOUNTER — Other Ambulatory Visit: Payer: Self-pay

## 2019-12-03 ENCOUNTER — Ambulatory Visit (HOSPITAL_COMMUNITY)
Admission: RE | Admit: 2019-12-03 | Discharge: 2019-12-03 | Disposition: A | Discharge status: Home / Self Care | Payer: BC Managed Care – PPO | Source: Ambulatory Visit | Attending: Cardiovascular Disease | Admitting: Cardiovascular Disease

## 2019-12-03 DIAGNOSIS — R9431 Abnormal electrocardiogram [ECG] [EKG]: Secondary | ICD-10-CM | POA: Diagnosis not present

## 2019-12-03 DIAGNOSIS — R079 Chest pain, unspecified: Secondary | ICD-10-CM | POA: Diagnosis not present

## 2019-12-03 LAB — MYOCARDIAL PERFUSION IMAGING
LV dias vol: 73 mL (ref 46–106)
LV sys vol: 24 mL
Peak HR: 105 {beats}/min
Rest HR: 82 {beats}/min
SDS: 4
SRS: 14
SSS: 18
TID: 0.9

## 2019-12-03 MED ORDER — REGADENOSON 0.4 MG/5ML IV SOLN
0.4000 mg | Freq: Once | INTRAVENOUS | Status: AC
  Administered 2019-12-03: 0.4 mg via INTRAVENOUS

## 2019-12-03 MED ORDER — TECHNETIUM TC 99M TETROFOSMIN IV KIT
9.2000 | PACK | Freq: Once | INTRAVENOUS | Status: AC | PRN
  Administered 2019-12-03: 9.2 via INTRAVENOUS
  Filled 2019-12-03: qty 10

## 2019-12-03 MED ORDER — TECHNETIUM TC 99M TETROFOSMIN IV KIT
30.2000 | PACK | Freq: Once | INTRAVENOUS | Status: AC | PRN
  Administered 2019-12-03: 30.2 via INTRAVENOUS
  Filled 2019-12-03: qty 31

## 2019-12-12 ENCOUNTER — Other Ambulatory Visit: Payer: Self-pay

## 2019-12-12 ENCOUNTER — Inpatient Hospital Stay (HOSPITAL_COMMUNITY)
Admission: EM | Admit: 2019-12-12 | Discharge: 2020-02-10 | DRG: 640 | Disposition: A | Discharge status: Skilled Nursing Facility | Payer: BC Managed Care – PPO | Attending: Family Medicine | Admitting: Family Medicine

## 2019-12-12 ENCOUNTER — Encounter (HOSPITAL_COMMUNITY): Payer: Self-pay

## 2019-12-12 ENCOUNTER — Emergency Department (HOSPITAL_COMMUNITY): Payer: BC Managed Care – PPO

## 2019-12-12 DIAGNOSIS — Z91018 Allergy to other foods: Secondary | ICD-10-CM

## 2019-12-12 DIAGNOSIS — B9620 Unspecified Escherichia coli [E. coli] as the cause of diseases classified elsewhere: Secondary | ICD-10-CM | POA: Diagnosis present

## 2019-12-12 DIAGNOSIS — M4802 Spinal stenosis, cervical region: Secondary | ICD-10-CM | POA: Diagnosis present

## 2019-12-12 DIAGNOSIS — B3789 Other sites of candidiasis: Secondary | ICD-10-CM | POA: Diagnosis not present

## 2019-12-12 DIAGNOSIS — L299 Pruritus, unspecified: Secondary | ICD-10-CM | POA: Diagnosis not present

## 2019-12-12 DIAGNOSIS — N2581 Secondary hyperparathyroidism of renal origin: Secondary | ICD-10-CM | POA: Diagnosis present

## 2019-12-12 DIAGNOSIS — I471 Supraventricular tachycardia: Secondary | ICD-10-CM | POA: Diagnosis not present

## 2019-12-12 DIAGNOSIS — I452 Bifascicular block: Secondary | ICD-10-CM | POA: Diagnosis present

## 2019-12-12 DIAGNOSIS — N390 Urinary tract infection, site not specified: Secondary | ICD-10-CM | POA: Diagnosis not present

## 2019-12-12 DIAGNOSIS — Q394 Esophageal web: Secondary | ICD-10-CM

## 2019-12-12 DIAGNOSIS — K5909 Other constipation: Secondary | ICD-10-CM | POA: Diagnosis present

## 2019-12-12 DIAGNOSIS — K449 Diaphragmatic hernia without obstruction or gangrene: Secondary | ICD-10-CM | POA: Diagnosis present

## 2019-12-12 DIAGNOSIS — E871 Hypo-osmolality and hyponatremia: Secondary | ICD-10-CM | POA: Diagnosis present

## 2019-12-12 DIAGNOSIS — M47816 Spondylosis without myelopathy or radiculopathy, lumbar region: Secondary | ICD-10-CM | POA: Diagnosis present

## 2019-12-12 DIAGNOSIS — M069 Rheumatoid arthritis, unspecified: Secondary | ICD-10-CM

## 2019-12-12 DIAGNOSIS — R634 Abnormal weight loss: Secondary | ICD-10-CM | POA: Diagnosis present

## 2019-12-12 DIAGNOSIS — R1310 Dysphagia, unspecified: Secondary | ICD-10-CM | POA: Diagnosis not present

## 2019-12-12 DIAGNOSIS — K224 Dyskinesia of esophagus: Secondary | ICD-10-CM | POA: Diagnosis present

## 2019-12-12 DIAGNOSIS — L89619 Pressure ulcer of right heel, unspecified stage: Secondary | ICD-10-CM | POA: Diagnosis not present

## 2019-12-12 DIAGNOSIS — B354 Tinea corporis: Secondary | ICD-10-CM

## 2019-12-12 DIAGNOSIS — M8580 Other specified disorders of bone density and structure, unspecified site: Secondary | ICD-10-CM | POA: Diagnosis present

## 2019-12-12 DIAGNOSIS — E43 Unspecified severe protein-calorie malnutrition: Secondary | ICD-10-CM | POA: Diagnosis not present

## 2019-12-12 DIAGNOSIS — E46 Unspecified protein-calorie malnutrition: Secondary | ICD-10-CM

## 2019-12-12 DIAGNOSIS — K222 Esophageal obstruction: Secondary | ICD-10-CM

## 2019-12-12 DIAGNOSIS — K228 Other specified diseases of esophagus: Secondary | ICD-10-CM | POA: Diagnosis not present

## 2019-12-12 DIAGNOSIS — R159 Full incontinence of feces: Secondary | ICD-10-CM | POA: Diagnosis present

## 2019-12-12 DIAGNOSIS — E8809 Other disorders of plasma-protein metabolism, not elsewhere classified: Secondary | ICD-10-CM | POA: Diagnosis not present

## 2019-12-12 DIAGNOSIS — J9811 Atelectasis: Secondary | ICD-10-CM | POA: Diagnosis present

## 2019-12-12 DIAGNOSIS — R760 Raised antibody titer: Secondary | ICD-10-CM | POA: Diagnosis not present

## 2019-12-12 DIAGNOSIS — M5030 Other cervical disc degeneration, unspecified cervical region: Secondary | ICD-10-CM | POA: Diagnosis present

## 2019-12-12 DIAGNOSIS — K1230 Oral mucositis (ulcerative), unspecified: Secondary | ICD-10-CM | POA: Diagnosis not present

## 2019-12-12 DIAGNOSIS — D709 Neutropenia, unspecified: Secondary | ICD-10-CM | POA: Diagnosis not present

## 2019-12-12 DIAGNOSIS — D469 Myelodysplastic syndrome, unspecified: Secondary | ICD-10-CM | POA: Diagnosis present

## 2019-12-12 DIAGNOSIS — L89152 Pressure ulcer of sacral region, stage 2: Secondary | ICD-10-CM | POA: Diagnosis not present

## 2019-12-12 DIAGNOSIS — E878 Other disorders of electrolyte and fluid balance, not elsewhere classified: Secondary | ICD-10-CM | POA: Diagnosis not present

## 2019-12-12 DIAGNOSIS — L89629 Pressure ulcer of left heel, unspecified stage: Secondary | ICD-10-CM | POA: Diagnosis not present

## 2019-12-12 DIAGNOSIS — R609 Edema, unspecified: Secondary | ICD-10-CM | POA: Diagnosis not present

## 2019-12-12 DIAGNOSIS — N182 Chronic kidney disease, stage 2 (mild): Secondary | ICD-10-CM | POA: Diagnosis present

## 2019-12-12 DIAGNOSIS — B360 Pityriasis versicolor: Secondary | ICD-10-CM | POA: Diagnosis present

## 2019-12-12 DIAGNOSIS — E876 Hypokalemia: Secondary | ICD-10-CM | POA: Diagnosis present

## 2019-12-12 DIAGNOSIS — R64 Cachexia: Secondary | ICD-10-CM | POA: Diagnosis present

## 2019-12-12 DIAGNOSIS — Z20822 Contact with and (suspected) exposure to covid-19: Secondary | ICD-10-CM | POA: Diagnosis present

## 2019-12-12 DIAGNOSIS — D509 Iron deficiency anemia, unspecified: Secondary | ICD-10-CM | POA: Diagnosis not present

## 2019-12-12 DIAGNOSIS — Z681 Body mass index (BMI) 19 or less, adult: Secondary | ICD-10-CM | POA: Diagnosis not present

## 2019-12-12 DIAGNOSIS — R531 Weakness: Secondary | ICD-10-CM | POA: Diagnosis not present

## 2019-12-12 DIAGNOSIS — Z79899 Other long term (current) drug therapy: Secondary | ICD-10-CM

## 2019-12-12 DIAGNOSIS — D649 Anemia, unspecified: Secondary | ICD-10-CM | POA: Diagnosis present

## 2019-12-12 DIAGNOSIS — B3781 Candidal esophagitis: Secondary | ICD-10-CM | POA: Diagnosis present

## 2019-12-12 DIAGNOSIS — K219 Gastro-esophageal reflux disease without esophagitis: Secondary | ICD-10-CM | POA: Diagnosis present

## 2019-12-12 DIAGNOSIS — R011 Cardiac murmur, unspecified: Secondary | ICD-10-CM | POA: Diagnosis present

## 2019-12-12 DIAGNOSIS — M47814 Spondylosis without myelopathy or radiculopathy, thoracic region: Secondary | ICD-10-CM | POA: Diagnosis present

## 2019-12-12 DIAGNOSIS — E875 Hyperkalemia: Secondary | ICD-10-CM | POA: Diagnosis not present

## 2019-12-12 DIAGNOSIS — D631 Anemia in chronic kidney disease: Secondary | ICD-10-CM | POA: Diagnosis present

## 2019-12-12 DIAGNOSIS — R229 Localized swelling, mass and lump, unspecified: Secondary | ICD-10-CM | POA: Diagnosis not present

## 2019-12-12 DIAGNOSIS — E739 Lactose intolerance, unspecified: Secondary | ICD-10-CM | POA: Diagnosis present

## 2019-12-12 DIAGNOSIS — N183 Chronic kidney disease, stage 3 unspecified: Secondary | ICD-10-CM | POA: Diagnosis not present

## 2019-12-12 DIAGNOSIS — R600 Localized edema: Secondary | ICD-10-CM | POA: Diagnosis present

## 2019-12-12 DIAGNOSIS — L89609 Pressure ulcer of unspecified heel, unspecified stage: Secondary | ICD-10-CM

## 2019-12-12 DIAGNOSIS — M6284 Sarcopenia: Secondary | ICD-10-CM | POA: Diagnosis present

## 2019-12-12 DIAGNOSIS — R627 Adult failure to thrive: Secondary | ICD-10-CM | POA: Diagnosis present

## 2019-12-12 HISTORY — DX: Unspecified osteoarthritis, unspecified site: M19.90

## 2019-12-12 HISTORY — DX: Chronic kidney disease, stage 3 unspecified: N18.30

## 2019-12-12 HISTORY — DX: Anemia in chronic kidney disease: D63.1

## 2019-12-12 HISTORY — DX: Secondary hyperparathyroidism of renal origin: N25.81

## 2019-12-12 HISTORY — DX: Gastro-esophageal reflux disease without esophagitis: K21.9

## 2019-12-12 HISTORY — DX: Anemia, unspecified: D64.9

## 2019-12-12 HISTORY — DX: Disorder of kidney and ureter, unspecified: N28.9

## 2019-12-12 HISTORY — DX: Dysphagia, unspecified: R13.10

## 2019-12-12 LAB — BASIC METABOLIC PANEL
Anion gap: 9 (ref 5–15)
BUN: 24 mg/dL — ABNORMAL HIGH (ref 8–23)
CO2: 19 mmol/L — ABNORMAL LOW (ref 22–32)
Calcium: 7.5 mg/dL — ABNORMAL LOW (ref 8.9–10.3)
Chloride: 108 mmol/L (ref 98–111)
Creatinine, Ser: 0.86 mg/dL (ref 0.44–1.00)
GFR calc Af Amer: >60 mL/min (ref >60)
GFR calc non Af Amer: >60 mL/min (ref >60)
Glucose, Bld: 94 mg/dL (ref 70–99)
Potassium: 2.6 mmol/L — CL (ref 3.5–5.1)
Sodium: 136 mmol/L (ref 135–145)

## 2019-12-12 LAB — CBC
HCT: 26.7 % — ABNORMAL LOW (ref 36.0–46.0)
Hemoglobin: 8.5 g/dL — ABNORMAL LOW (ref 12.0–15.0)
MCH: 30.4 pg (ref 26.0–34.0)
MCHC: 31.8 g/dL (ref 30.0–36.0)
MCV: 95.4 fL (ref 80.0–100.0)
Platelets: 159 10*3/uL (ref 150–400)
RBC: 2.8 MIL/uL — ABNORMAL LOW (ref 3.87–5.11)
RDW: 16.1 % — ABNORMAL HIGH (ref 11.5–15.5)
WBC: 5.7 10*3/uL (ref 4.0–10.5)
nRBC: 0 % (ref 0.0–0.2)

## 2019-12-12 LAB — MAGNESIUM: Magnesium: 1.6 mg/dL — ABNORMAL LOW (ref 1.7–2.4)

## 2019-12-12 LAB — HEPATIC FUNCTION PANEL
ALT: 26 U/L (ref 0–44)
AST: 21 U/L (ref 15–41)
Albumin: 1.4 g/dL — ABNORMAL LOW (ref 3.5–5.0)
Alkaline Phosphatase: 86 U/L (ref 38–126)
Bilirubin, Direct: 0.1 mg/dL (ref 0.0–0.2)
Indirect Bilirubin: 0.2 mg/dL — ABNORMAL LOW (ref 0.3–0.9)
Total Bilirubin: 0.3 mg/dL (ref 0.3–1.2)
Total Protein: 5.4 g/dL — ABNORMAL LOW (ref 6.5–8.1)

## 2019-12-12 LAB — SARS CORONAVIRUS 2 (TAT 6-24 HRS): SARS Coronavirus 2: NEGATIVE

## 2019-12-12 MED ORDER — ENOXAPARIN SODIUM 30 MG/0.3ML ~~LOC~~ SOLN
30.0000 mg | SUBCUTANEOUS | Status: DC
  Administered 2019-12-13 – 2019-12-16 (×5): 30 mg via SUBCUTANEOUS
  Filled 2019-12-12 (×5): qty 0.3

## 2019-12-12 MED ORDER — ENOXAPARIN SODIUM 40 MG/0.4ML ~~LOC~~ SOLN: 40.0000 mg | SUBCUTANEOUS | Status: DC

## 2019-12-12 MED ORDER — SODIUM CHLORIDE 0.9 % IV BOLUS
1000.0000 mL | Freq: Once | INTRAVENOUS | Status: AC
  Administered 2019-12-12: 1000 mL via INTRAVENOUS

## 2019-12-12 MED ORDER — MAGNESIUM SULFATE 2 GM/50ML IV SOLN
2.0000 g | Freq: Once | INTRAVENOUS | Status: AC
  Administered 2019-12-12: 2 g via INTRAVENOUS
  Filled 2019-12-12: qty 50

## 2019-12-12 MED ORDER — POTASSIUM CHLORIDE 10 MEQ/100ML IV SOLN
10.0000 meq | INTRAVENOUS | Status: AC
  Administered 2019-12-12 (×2): 10 meq via INTRAVENOUS
  Filled 2019-12-12: qty 100

## 2019-12-12 MED ORDER — ACETAMINOPHEN 650 MG RE SUPP: 650.0000 mg | Freq: Four times a day (QID) | RECTAL | Status: DC | PRN

## 2019-12-12 MED ORDER — ACETAMINOPHEN 325 MG PO TABS
650.0000 mg | ORAL_TABLET | Freq: Four times a day (QID) | ORAL | Status: DC | PRN
  Administered 2019-12-13 – 2019-12-31 (×9): 650 mg via ORAL
  Filled 2019-12-12 (×11): qty 2

## 2019-12-12 MED ORDER — POTASSIUM CHLORIDE CRYS ER 20 MEQ PO TBCR
60.0000 meq | EXTENDED_RELEASE_TABLET | Freq: Once | ORAL | Status: AC
  Administered 2019-12-12: 60 meq via ORAL
  Filled 2019-12-12: qty 3

## 2019-12-12 NOTE — ED Provider Notes (Addendum)
  Physical Exam  BP 108/63 (BP Location: Right Arm)   Pulse 100   Temp 98.5 F (36.9 C) (Oral)   Resp 17   Ht 5\' 4"  (1.626 m)   Wt 42.2 kg   BMI 15.96 kg/m   Physical Exam  ED Course/Procedures     .Critical Care Performed by: Varney Biles, MD Authorized by: Varney Biles, MD   Critical care provider statement:    Critical care time (minutes):  32   Critical care was necessary to treat or prevent imminent or life-threatening deterioration of the following conditions:  Metabolic crisis   Critical care was time spent personally by me on the following activities:  Discussions with consultants, evaluation of patient's response to treatment, examination of patient, ordering and performing treatments and interventions, ordering and review of laboratory studies, ordering and review of radiographic studies, pulse oximetry, re-evaluation of patient's condition, obtaining history from patient or surrogate and review of old charts    MDM  Assuming care of patient from Dr. Langston Masker.   Patient in the ED for generalized weakness. She is noted to have poor rectal tone and severe lower extremity weakness and numbness in both extremities. Pt has been gradually getting worse. Workup thus far shows : hypokalemia and hypomag.  Concerning findings are as following: abnormal labs and profound weakness. Important pending results are MRI spine to r/o cauda equina,  According to Dr. Langston Masker, plan is to admit the patient for profound weakness and elyte abn - not safe for d/c without further deterioration. Covid screening ordered, but no resp symptoms.  Patient had no complains, no concerns from the nursing side. Will continue to monitor.   Varney Biles, MD 12/12/19 1633  6:56 PM Low albumin. Corrected Ca is normal. Stable for admission. ? Cancer or other wasting disease ? Severe metabolic dz.    Varney Biles, MD 12/12/19 1859

## 2019-12-12 NOTE — ED Notes (Signed)
Phlebotomist attempted to draw admission order labs -- unable to obtain any blood. Inpatient phlebotomy to draw pt.

## 2019-12-12 NOTE — ED Provider Notes (Signed)
[place] EMERGENCY DEPARTMENT Provider Note   CSN: [id number] Arrival date & time: 12/12/19  1301     History Chief Complaint  Patient presents with  . Weakness  . Abnormal Lab    Deanna Cunningham is a 74 y.o. female history of lumbar spinal stenosis and disc protrusion, chronic kidney disease, anemia, presented to emergency department with generalized weakness and hypokalemia.  Patient was referred into the emergency department for having low potassium as an outpatient.  Also complaining about weakness in her arms and legs ongoing for weeks.  She feels like she's having particularly worsening weakness in her legs this week.  Difficulty raising legs off bed.  Unable to ambulate.  She reports loss of control of her bowel since January 2021 (no loss of bladder control).  She reports she cannot feel things around her rectum.  She had an outpatient MRI of the L spine done on Nov 11, 2019 via Dr Lynann Bologna her spine surgeon.   IMPRESSION: 1. Multilevel degenerative changes of the lumbar spine as described above, slightly progressed at L2-L3 and L3-L4. 2. Previously seen large disc extrusion at L5-S1 has improved from prior. However, there is still a residual posterior disc protrusion at the L5-S1 level with moderate canal stenosis and moderate bilateral subarticular recess stenosis. 3. Moderate L4-L5 canal stenosis. 4. Unchanged grade 1 anterolisthesis of L4 on L5. 5. Severe diffuse marrow heterogeneity without definite marrow replacing lesion. Findings are nonspecific but can be seen in the setting of chronic anemia, smoking, and/or obesity.    HPI     Past Medical History:  Diagnosis Date  . Anemia   . Anemia in chronic renal disease   . Arthritis    rheumatoid  . CKD (chronic kidney disease), stage III   . Dysphagia   . GERD (gastroesophageal reflux disease)   . Osteopenia 04/2018   T score -2.0 FRAX 4% / 0.9%  . Renal disorder   . Secondary  hyperparathyroidism, renal [facility])     Patient Active Problem List   Diagnosis Date Noted  . Syncope and collapse 05/17/2013  . Right bundle branch block 05/17/2013  . Chronic anemia 05/17/2013  . GERD (gastroesophageal reflux disease) 05/17/2013    Past Surgical History:  Procedure Laterality Date  . arthritis    . ESOPHAGOGASTRODUODENOSCOPY N/A 02/20/2014   Procedure: ESOPHAGOGASTRODUODENOSCOPY (EGD);  Surgeon: [name], MD;  Location: [facility];  Service: Gastroenterology;  Laterality: N/A;  . ESOPHAGOGASTRODUODENOSCOPY (EGD) WITH ESOPHAGEAL DILATION       OB History    Gravida  2   Para  2   Term      Preterm      AB      Living  2     SAB      TAB      Ectopic      Multiple      Live Births              Family History  Problem Relation Age of Onset  . Congestive Heart Failure Mother   . Diabetes Mother   . Hypertension Mother   . Diabetes Sister   . Hypertension Sister   . Cancer Brother        tumor removed from bladder  . Diabetes Brother   . Hypertension Brother     Social History   Tobacco Use  . Smoking status: Never Smoker  . Smokeless tobacco: Never Used  Substance Use Topics  .  Alcohol use: No  . Drug use: No    Home Medications Prior to Admission medications   Medication Sig Start Date End Date Taking? Authorizing Provider  potassium chloride (KLOR-CON) 20 MEQ packet Take 1 packet by mouth in the morning and at bedtime. 12/04/19   [provider]  ranitidine (ZANTAC) 150 MG tablet Take 150 mg by mouth daily.    [provider]    Allergies    Patient has no known allergies.  Review of Systems   Review of Systems  Constitutional: Positive for appetite change and fatigue.  Respiratory: Negative for cough and shortness of breath.   Cardiovascular: Negative for chest pain and palpitations.  Gastrointestinal: Positive for diarrhea and nausea. Negative for abdominal pain and vomiting.  Musculoskeletal:  Positive for gait problem. Negative for arthralgias and back pain.  Skin: Negative for color change and rash.  Neurological: Positive for weakness and numbness. Negative for seizures, syncope and light-headedness.  All other systems reviewed and are negative.   Physical Exam Updated Vital Signs BP 108/63 (BP Location: Right Arm)   Pulse 100   Temp 98.5 F (36.9 C) (Oral)   Resp 17   Ht 5\' 4"  (1.626 m)   Wt 42.2 kg   BMI 15.96 kg/m   Physical Exam Vitals and nursing note reviewed.  Constitutional:      Appearance: She is well-developed.     Comments: Thin, cachetic  HENT:     Head: Normocephalic and atraumatic.  Eyes:     Conjunctiva/sclera: Conjunctivae normal.  Cardiovascular:     Rate and Rhythm: Normal rate and regular rhythm.     Pulses: Normal pulses.  Pulmonary:     Effort: Pulmonary effort is normal. No respiratory distress.     Breath sounds: Normal breath sounds.  Abdominal:     Palpations: Abdomen is soft.     Tenderness: There is no abdominal tenderness.  Musculoskeletal:     Cervical back: Neck supple.  Skin:    General: Skin is warm and dry.  Neurological:     Mental Status: She is alert.     GCS: GCS eye subscore is 4. GCS verbal subscore is 5. GCS motor subscore is 6.     Cranial Nerves: Cranial nerves are intact.     Comments: No spinal midline tenderness Poor rectal tone No saddle anesthesia 3/5 strength in bilateral hip flexors 4/5 strength in bilateral knee extension 4/5 strength in bilateral dorsiflexion Reports paresthesia in bilateral L4 distribution Pinprick sensation intact      ED Results / Procedures / Treatments   Labs (all labs ordered are listed, but only abnormal results are displayed) Labs Reviewed  BASIC METABOLIC PANEL - Abnormal; Notable for the following components:      Result Value   Potassium 2.6 (*)    CO2 19 (*)    BUN 24 (*)    Calcium 7.5 (*)    All other components within normal limits  CBC - Abnormal;  Notable for the following components:   RBC 2.80 (*)    Hemoglobin 8.5 (*)    HCT 26.7 (*)    RDW 16.1 (*)    All other components within normal limits  MAGNESIUM - Abnormal; Notable for the following components:   Magnesium 1.6 (*)    All other components within normal limits  SARS CORONAVIRUS 2 (TAT 6-24 HRS)  HEPATIC FUNCTION PANEL    EKG EKG Interpretation  Date/Time:  Friday December 12 2019 13:08:15 EDT Ventricular  Rate:  99 PR Interval:  168 QRS Duration: 132 QT Interval:  402 QTC Calculation: 515 R Axis:   -66 Text Interpretation: Sinus rhythm with occasional Premature ventricular complexes and Premature atrial complexes Left axis deviation Right bundle branch block Anterior infarct , age undetermined Abnormal ECG Porlonged QTc, no STEMI , similar to prior ecg pattern Confirmed by Octaviano Glow [phone]) on 12/12/2019 2:23:01 PM   Radiology MR LUMBAR SPINE WO CONTRAST  Result Date: 12/12/2019 CLINICAL DATA:  Patient reporting new numbness and weakness since 11/08/2019 MRI, L4 distribution numbness, poor rectal tone, poor strength; low back pain, cauda equina syndrome suspected. Additional history provided: Generalized weakness and hypokalemia, patient reports numbness from the waist down for the past month, multiple falls. EXAM: MRI LUMBAR SPINE WITHOUT CONTRAST TECHNIQUE: Multiplanar, multisequence MR imaging of the lumbar spine was performed. No intravenous contrast was administered. COMPARISON:  Lumbar spine MRI 11/10/2019. FINDINGS: Segmentation: For the purposes of this dictation, five lumbar vertebrae are assumed and the caudal most well-formed intervertebral disc is designated L5-S1. Alignment: Lumbar dextrocurvature. Unchanged 4 mm L4-L5 grade 1 anterolisthesis. Vertebrae: Vertebral body height is maintained. Redemonstrated diffusely heterogeneous marrow signal with a mottled appearance. Conus medullaris and cauda equina: Conus extends to the L1-L2 level. No signal  abnormality within the visualized distal spinal cord. Paraspinal and other soft tissues: Nonspecific diffuse edema within the lumbar paraspinal soft tissues which may reflect a fluid overloaded state. Colonic diverticulosis. Disc levels: Unless otherwise stated, the level by level findings below have not significantly changed since prior MRI 11/10/2019. Moderate disc degeneration throughout the lumbar spine, greatest at L4-L5. T12-L1: Small central disc protrusion. No significant spinal canal or foraminal stenosis. L1-L2: Disc bulge. Superimposed small right center disc protrusion. No significant spinal canal or foraminal stenosis. L2-L3: Disc bulge. Superimposed left center to left foraminal disc protrusion. Mild facet arthrosis. Unchanged left subarticular stenosis with slight crowding of the descending left L3 nerve root. Mild relative narrowing of the central canal. Moderate left neural foraminal narrowing. L3-L4: Disc bulge. Superimposed broad-based left center/foraminal disc protrusion. Mild facet arthrosis/ligamentum flavum hypertrophy. Left subarticular stenosis with crowding of the descending left L4 nerve root. Central canal patent. Moderate left neural foraminal narrowing. L4-L5: Grade 1 anterolisthesis. Disc uncovering and disc bulge. Prominent facet arthrosis with ligamentum flavum hypertrophy. Moderate/severe bilateral subarticular stenosis with crowding of the descending L5 nerve roots. Moderate central canal stenosis. Bilateral neural foraminal narrowing (mild right, moderate left). L5-S1: Disc bulge with endplate spurring. Superimposed moderate-sized central disc protrusion at site of posterior annular fissure. Mild facet arthrosis. Bilateral subarticular stenosis with crowding of the descending S1 nerve roots (greater on the left) (series 8, image 29). Moderate central canal stenosis. No significant foraminal narrowing. IMPRESSION: Lumbar spondylosis has not significantly changed as compared MRI  11/10/2019. Findings are most notably as follows. At L4-L5, grade 1 anterolisthesis, disc uncovering and disc bulge, prominent facet arthrosis, ligamentum flavum hypertrophy. Moderate/severe bilateral subarticular stenosis with crowding of the descending L5 nerve roots. Moderate central canal stenosis. Bilateral neural foraminal narrowing (mild right, moderate left). At L5-S1, a broad-based central disc protrusion contributes to multifactorial bilateral subarticular narrowing with crowding of the descending S1 nerve roots (greater on the left). Moderate central canal stenosis. Multifactorial left subarticular narrowing at L2-L3 and L3-L4 with crowding of the descending L3 and L4 nerve roots respectively. Redemonstrated diffuse heterogeneity of the osseous marrow signal. This finding has a broad range of differential causes, although in this patient may be secondary to chronic anemia. Electronically Signed  By: Kellie Simmering DO   On: 12/12/2019 16:39    Procedures Procedures (including critical care time)  Medications Ordered in ED Medications  potassium chloride 10 mEq in 100 mL IVPB (10 mEq Intravenous New Bag/Given 12/12/19 1514)  magnesium sulfate IVPB 2 g 50 mL (2 g Intravenous New Bag/Given 12/12/19 1537)  sodium chloride 0.9 % bolus 1,000 mL (1,000 mLs Intravenous New Bag/Given 12/12/19 1513)    ED Course  I have reviewed the triage vital signs and the nursing notes.  Pertinent labs & imaging results that were available during my care of the patient were reviewed by me and considered in my medical decision making (see chart for details).  74 year old female presented to emergency department with hypokalemia, weakness, paresthesias and numbness below the waist.  She reports diarrhea or loose bowel movements with poor sphincter control for the past several months.  She is found to be hypokalemic for magnesium as well here.  I suspect this is largely due to malnourishment.  She appears very thin  and emaciated.  She is being managed by spine doctor for her chronic neurological complaints.  She had an MRI as recently as a month ago.  Report is posted above.  Unable to see the neurosurgeon office note.  However based on my exam today I think is reasonable obtain a repeat MRI for comparison, in terms of cauda equina evaluation.  Doubtful this is an acute change for her.  She will need admission for hypokalemia, failure to thrive, PT evaluation     Final Clinical Impression(s) / ED Diagnoses Final diagnoses:  Hypokalemia  Hypomagnesemia  Weakness    Rx / DC Orders ED Discharge Orders    None       Wyvonnia Dusky, MD 12/12/19 1643

## 2019-12-12 NOTE — H&P (Addendum)
[facility] Admission History and Physical Service Pager: [phone]  Patient name: Deanna Cunningham Medical record number: [id number] Date of birth: 1945/10/12 Age: 74 y.o. Gender: female  Primary Care Provider: Kristie Cowman, MD Consultants:  Code Status: Full code, limit  Preferred Emergency Contact: Daughter Joya Gaskins [phone]; brother Dr. Brayton Caves  Chief Complaint: weakness  Assessment and Plan: Deanna Cunningham is a 74 y.o. female presenting with weakness. PMH is significant for Rheumatoid Arthritis (no tx, no rheumatologist), chronic anemia and GERD.  Severe protein calorie malnutrition, concern for malignancy  Patient presents to the ER today reporting progressively worsening weakness, poor oral intake since December 2020, with recent lower extremity swelling. Additionally, patient reports unintentional weight loss, now weighing about 93lb as well as fecal incontinence since February 2021. Labs on admission: Hb 8.5, K 2.6, Mg 1.6, protein 5.4 and albumin 1.4. Primary differential would be malignancy given weight loss, cachexia, white plaques in oral cavity and anemia. Considering myelodysplasia as patient has a chronic history of anemia since 2011. Was seen by Dr Jamse Arn who recommended BM biopsy for myelodysplastic syndrome in 2014 but patient declined. Pt also has a history of GERD and esophageal stricture so considering esophageal/gastric Ca. Last EGD 2015 showed esophageal web, schatzki ring, hiatal hernia and early Zenker's diverticulum. Colonoscopy in Nov 2020 with no unusual findings. Also considered pancreatic Ca given grey stools and dark urine. Can consider testing for Ca 19-9. There is concern for refeeding syndrome given electrolyte abnormalities and poor PO intake. Considering nephrotic syndrome as possible cause of peripheral edema and low albumin. Will follow up urine protein/creatinine ratio. -Admit to med-surg under FPTS, Dr Owens Shark  attending -Vitals per floor routine -Continuous telemetry due to hypokalemia, continuous pulse ox -Normal diet as tolerated -Avoid Zofran due to QTC 515 -F/u orthostatic Bps  -F/u phosphate -F/u folic acid and vitamin B12 -F/u HIV, peripheral smear, hepatitis C, CK -F/u UA  -F/u Right upper quadrant ultrasound -Supplement with Ensure -SLP evaluation -Nutrition eval  Anemia, acute on chronic Chronic anemia dates back to at least 2011.  Hb 8.5 on admission, baseline ~8, was as low as 7.4 in August 2014.  MCV on admission normal at 95.  Anemia thought to be secondary to high likelihood of myelodysplastic syndrome as noticed in 2014, patient has refused bone marrow biopsy.  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  The oncologist explained to the patient thoroughly in 2014 the need for bone marrow biopsy, treatment and risk of possible progression to acute myeloid leukemia without intervention and seriousness of life-threatening condition.  Patient declined bone marrow biopsy, agreed to surveillance only." -Monitor CBC -Peripheral smear ordered, follow-up -Follow-up ferritin, iron, TIBC, B1, folate, and B12  Hypokalemia Likely 2/2 chronic poor intake. K 2.6 on admission.  Repleted with CL 10 meq x2 and KCl 60 mEq PO  -Continuous telemetry  -Monitor with daily BMP   Hypomagnesemia  Likely 2/2 chronic poor intake. Mg 1.6 on admission. Repleted with Mg 2g IV -Monitor with CMP   Sacral wound: Patient reports mild sore on her low back near her butt.  Patient states this has developed since she has been sitting more at home due to feeling weak.  Upon inspection grade 1 sacral ulcer is able to be seen, no evidence  of infection at this time.  See photo below. -Keep area clean and  covered, add padding to protect her skin -Recommend frequent position changes while lying in bed -Encourage out of bed  QT prolongation EKG showed SR, PVCs, RBBB and QTc 525m -Continuous telemetry -Avoid QT prolonging medications  Hypocalcemia 7.5, corrects to 9.1 -Monitor calcium   CKD 3 Creatinine 0.86 on admission, baseline appears 1.2-1.3 -Monitor with BMP  Rheumatoid arthritis Ulnar deviation of MCP joints bilaterally. Does not have Rheumatologist and is not on DMARDs. -Monitor pain   Hx of secondary hyperparathyroidism, osteopenia, dexa scan 04/2018  FEN/GI: Normal diet  Prophylaxis: Lovenox   Disposition: IP for the next 1-2 days   History of Present Illness:  Deanna DIGNANis a 74y.o. female presenting with weakness and numbness.  Patient has been in poor health since 27th December with progressive decline. Prior to this was functioning well. Pt has had generalized weakness and poor PO intake which has worsened over the last few weeks which is the main reason she presented to the ER today. Since February she noticed her legs have become more swollen bilaterally extending up to her waist, they are tender to touch with deep palpation. Also endorses poor balance and incontinence of her bowels which are new symptoms for her. She is now requiring a lot of help to move or change positions.  A few years ago used to weight 130lb and now weighs 93lb. Endorses "white stuff on under tongue" which is not painful and has dry mouth at night. Also endorses recent nausea and vomiting. Stools have been a grey colour since Jan 2021 and urine is a dark orange/amber color. Denies hematuria or rectal bleeding. Recently fell in the doctors office and at home recently. Denies head injury and is not on anticoagulants.  Denies chest pain, SOB, cough, abdominal pain, headaches, vision changes, fevers or dysphagia.   Lives with daughter and grandson, daughter helps her with ADLs since Feb/March.  Uses a walker but has had trouble with balance and walking recently.   Review Of Systems: Per HPI with the following additions:  ROS  Patient Active Problem List   Diagnosis Date Noted  . Syncope and collapse 05/17/2013  . Right bundle branch block 05/17/2013  . Chronic anemia 05/17/2013  . GERD (gastroesophageal reflux disease) 05/17/2013    Past Medical History: Past Medical History:  Diagnosis Date  . Anemia   . Anemia in chronic renal disease   . Arthritis    rheumatoid  . CKD (chronic kidney disease), stage III   . Dysphagia   . GERD (gastroesophageal reflux disease)   . Osteopenia 04/2018   T score -2.0 FRAX 4% / 0.9%  . Renal disorder   . Secondary hyperparathyroidism, renal ([facility]     Past Surgical History: Past Surgical History:  Procedure Laterality Date  . arthritis    . ESOPHAGOGASTRODUODENOSCOPY N/A 02/20/2014   Procedure: ESOPHAGOGASTRODUODENOSCOPY (EGD);  Surgeon: PBeryle Beams MD;  Location: [facility];  Service: Gastroenterology;  Laterality: N/A;  . ESOPHAGOGASTRODUODENOSCOPY (EGD) WITH ESOPHAGEAL DILATION      Social History: Social History   Tobacco Use  . Smoking status: Never Smoker  . Smokeless tobacco: Never Used  Substance Use Topics  . Alcohol use: No  . Drug use: No   Additional social history:  Please also refer to relevant sections of EMR.  Family History: Family History  Problem Relation Age of Onset  . Congestive Heart Failure Mother   .  Diabetes Mother   . Hypertension Mother   . Diabetes Sister   . Hypertension Sister   . Cancer Brother        tumor removed from bladder  . Diabetes Brother   . Hypertension Brother     Allergies and Medications: No Known Allergies No current facility-administered medications on file prior to encounter.   Current Outpatient Medications on File Prior to Encounter  Medication Sig Dispense Refill  . acetaminophen (TYLENOL) 500 MG tablet Take 500 mg by mouth every 6 (six) hours as needed  for mild pain.    . famotidine (PEPCID) 10 MG tablet Take 10 mg by mouth daily as needed for heartburn or indigestion.    . ranitidine (ZANTAC) 150 MG tablet Take 150 mg by mouth daily.      Objective: BP 122/90   Pulse 82   Temp 98.5 F (36.9 C) (Oral)   Resp 17   Ht 5' 4"  (1.626 m)   Wt 42.2 kg   SpO2 97%   BMI 15.96 kg/m  Exam: General: Cachetic appearing 74 yr old female, pleasant, no acute distress Eyes: No scleral icterus, normal EOM  ENTM: Atrophic glossitis, nontender white plaques under tongue and posterior palate, do not scrape off will tongue depressor, no pharyngeal edema or other exudates  Neck: Supple, normal ROM  Cardiovascular: S1 and S2, RRR Respiratory: CTAB, normal WOB  Gastrointestinal:  Abdomen soft, non distended, slight tenderness in LUQ pain, no guarding, bowel sounds present MSK: ulnar deviation of MCPs, bilateral 2+ pitting edema of extremities  Derm: dry skin all over body, warm Neuro: cranial nerves grossly intact  Psych: normal mood, normal affect      Pressure sore      Labs and Imaging: CBC BMET  Recent Labs  Lab 12/12/19 1317  WBC 5.7  HGB 8.5*  HCT 26.7*  PLT 159   Recent Labs  Lab 12/12/19 1317  NA 136  K 2.6*  CL 108  CO2 19*  BUN 24*  CREATININE 0.86  GLUCOSE 94  CALCIUM 7.5*     EKG: Sinus rhythm, PVCs, right bundle branch block, QTC prolongation   Deanna Haw, MD 12/12/2019, 8:53 PM PGY-1, [facility] pager: [phone], text pages welcome   FPTS Upper-Level Resident Addendum   I have independently interviewed and examined the patient. I have discussed the above with the original author and agree with their documentation. Please see also any attending notes.    Milus Banister, DO PGY-2, [facility] 12/13/2019 8:13 AM  FPTS Service pager: [phone] (text pages welcome through [facility])

## 2019-12-12 NOTE — ED Triage Notes (Signed)
Pt sent by doctor for further evaluation of generalized weakness and hypokalemia (2.6). pt also states she feels numb from the waist down for the past month. Multiple falls due to weakness. Pt alert.

## 2019-12-12 NOTE — ED Notes (Signed)
Patient transported to MRI 

## 2019-12-12 NOTE — ED Notes (Signed)
Spoke with [name], pts daughter, let her know pt got room assignment.

## 2019-12-13 ENCOUNTER — Encounter (HOSPITAL_COMMUNITY): Payer: Self-pay | Admitting: Family Medicine

## 2019-12-13 ENCOUNTER — Inpatient Hospital Stay (HOSPITAL_COMMUNITY): Payer: BC Managed Care – PPO

## 2019-12-13 DIAGNOSIS — E46 Unspecified protein-calorie malnutrition: Secondary | ICD-10-CM

## 2019-12-13 DIAGNOSIS — E876 Hypokalemia: Secondary | ICD-10-CM

## 2019-12-13 DIAGNOSIS — E8809 Other disorders of plasma-protein metabolism, not elsewhere classified: Secondary | ICD-10-CM | POA: Diagnosis not present

## 2019-12-13 DIAGNOSIS — R531 Weakness: Secondary | ICD-10-CM

## 2019-12-13 DIAGNOSIS — E43 Unspecified severe protein-calorie malnutrition: Secondary | ICD-10-CM | POA: Diagnosis not present

## 2019-12-13 LAB — CBC
HCT: 24.2 % — ABNORMAL LOW (ref 36.0–46.0)
Hemoglobin: 8.4 g/dL — ABNORMAL LOW (ref 12.0–15.0)
MCH: 30.9 pg (ref 26.0–34.0)
MCHC: 34.7 g/dL (ref 30.0–36.0)
MCV: 89 fL (ref 80.0–100.0)
Platelets: 179 10*3/uL (ref 150–400)
RBC: 2.72 MIL/uL — ABNORMAL LOW (ref 3.87–5.11)
RDW: 15.9 % — ABNORMAL HIGH (ref 11.5–15.5)
WBC: 7.2 10*3/uL (ref 4.0–10.5)
nRBC: 0 % (ref 0.0–0.2)

## 2019-12-13 LAB — COMPREHENSIVE METABOLIC PANEL
ALT: 24 U/L (ref 0–44)
AST: 21 U/L (ref 15–41)
Albumin: 1.3 g/dL — ABNORMAL LOW (ref 3.5–5.0)
Alkaline Phosphatase: 90 U/L (ref 38–126)
Anion gap: 8 (ref 5–15)
BUN: 20 mg/dL (ref 8–23)
CO2: 18 mmol/L — ABNORMAL LOW (ref 22–32)
Calcium: 7.3 mg/dL — ABNORMAL LOW (ref 8.9–10.3)
Chloride: 110 mmol/L (ref 98–111)
Creatinine, Ser: 0.79 mg/dL (ref 0.44–1.00)
GFR calc Af Amer: >60 mL/min (ref >60)
GFR calc non Af Amer: >60 mL/min (ref >60)
Glucose, Bld: 100 mg/dL — ABNORMAL HIGH (ref 70–99)
Potassium: 3.6 mmol/L (ref 3.5–5.1)
Sodium: 136 mmol/L (ref 135–145)
Total Bilirubin: 0.4 mg/dL (ref 0.3–1.2)
Total Protein: 5.2 g/dL — ABNORMAL LOW (ref 6.5–8.1)

## 2019-12-13 LAB — CBC WITH DIFFERENTIAL/PLATELET
Abs Immature Granulocytes: 0.04 10*3/uL (ref 0.00–0.07)
Basophils Absolute: 0 10*3/uL (ref 0.0–0.1)
Basophils Relative: 0 %
Eosinophils Absolute: 0 10*3/uL (ref 0.0–0.5)
Eosinophils Relative: 0 %
HCT: 23.8 % — ABNORMAL LOW (ref 36.0–46.0)
Hemoglobin: 8.1 g/dL — ABNORMAL LOW (ref 12.0–15.0)
Immature Granulocytes: 1 %
Lymphocytes Relative: 6 %
Lymphs Abs: 0.4 10*3/uL — ABNORMAL LOW (ref 0.7–4.0)
MCH: 30.2 pg (ref 26.0–34.0)
MCHC: 34 g/dL (ref 30.0–36.0)
MCV: 88.8 fL (ref 80.0–100.0)
Monocytes Absolute: 0.2 10*3/uL (ref 0.1–1.0)
Monocytes Relative: 3 %
Neutro Abs: 5.8 10*3/uL (ref 1.7–7.7)
Neutrophils Relative %: 90 %
Platelets: 185 10*3/uL (ref 150–400)
RBC: 2.68 MIL/uL — ABNORMAL LOW (ref 3.87–5.11)
RDW: 15.8 % — ABNORMAL HIGH (ref 11.5–15.5)
WBC: 6.5 10*3/uL (ref 4.0–10.5)
nRBC: 0 % (ref 0.0–0.2)

## 2019-12-13 LAB — LIPID PANEL
Cholesterol: 61 mg/dL (ref 0–200)
HDL: 30 mg/dL — ABNORMAL LOW (ref >40)
LDL Cholesterol: 20 mg/dL (ref 0–99)
Total CHOL/HDL Ratio: 2 RATIO
Triglycerides: 56 mg/dL (ref <150)
VLDL: 11 mg/dL (ref 0–40)

## 2019-12-13 LAB — IRON AND TIBC: Iron: 43 ug/dL (ref 28–170)

## 2019-12-13 LAB — FERRITIN: Ferritin: 964 ng/mL — ABNORMAL HIGH (ref 11–307)

## 2019-12-13 LAB — HEMOGLOBIN A1C
Hgb A1c MFr Bld: 5.1 % (ref 4.8–5.6)
Mean Plasma Glucose: 99.67 mg/dL

## 2019-12-13 LAB — VITAMIN B12: Vitamin B-12: 1407 pg/mL — ABNORMAL HIGH (ref 180–914)

## 2019-12-13 LAB — PHOSPHORUS: Phosphorus: 2.3 mg/dL — ABNORMAL LOW (ref 2.5–4.6)

## 2019-12-13 LAB — HIV ANTIBODY (ROUTINE TESTING W REFLEX): HIV Screen 4th Generation wRfx: NONREACTIVE

## 2019-12-13 LAB — SAVE SMEAR(SSMR), FOR PROVIDER SLIDE REVIEW

## 2019-12-13 LAB — CK: Total CK: 41 U/L (ref 38–234)

## 2019-12-13 LAB — MAGNESIUM: Magnesium: 1.9 mg/dL (ref 1.7–2.4)

## 2019-12-13 LAB — FOLATE: Folate: 19.4 ng/mL (ref >5.9)

## 2019-12-13 MED ORDER — POTASSIUM PHOSPHATES 15 MMOLE/5ML IV SOLN
5.0000 mmol | Freq: Once | INTRAVENOUS | Status: AC
  Administered 2019-12-13: 5 mmol via INTRAVENOUS
  Filled 2019-12-13: qty 1.67

## 2019-12-13 MED ORDER — ENSURE ENLIVE PO LIQD
237.0000 mL | Freq: Two times a day (BID) | ORAL | Status: DC
  Administered 2019-12-13: 237 mL via ORAL

## 2019-12-13 MED ORDER — PANTOPRAZOLE SODIUM 40 MG IV SOLR
40.0000 mg | INTRAVENOUS | Status: DC
  Administered 2019-12-13 – 2019-12-14 (×2): 40 mg via INTRAVENOUS
  Filled 2019-12-13 (×2): qty 40

## 2019-12-13 MED ORDER — BOOST / RESOURCE BREEZE PO LIQD CUSTOM
1.0000 | Freq: Three times a day (TID) | ORAL | Status: DC
  Administered 2019-12-13 – 2019-12-15 (×5): 1 via ORAL

## 2019-12-13 MED ORDER — ADULT MULTIVITAMIN W/MINERALS CH
1.0000 | ORAL_TABLET | Freq: Every day | ORAL | Status: DC
  Administered 2019-12-13 – 2020-02-10 (×56): 1 via ORAL
  Filled 2019-12-13 (×57): qty 1

## 2019-12-13 NOTE — Progress Notes (Signed)
Family Medicine Teaching Service Daily Progress Note Intern Pager: [phone]  Patient name: Deanna Cunningham Medical record number: [id number] Date of birth: [birth year] Age: 74 y.o. Gender: female  Primary Care Provider: Kristie Cowman, MD Consultants: None, consider reaching out to patient's orthopedist Code Status: Full code  Pt Overview and Major Events to Date:  3/26-patient admitted for weakness, malnutrition  Assessment and Plan: Deanna Cunningham is a 74 y.o. female presenting with weakness. PMH is significant for Rheumatoid Arthritis (no tx, no rheumatologist), chronic anemia and GERD.  Severe protein calorie malnutrition, concern for malignancy  As patient was just seen a few hours ago, there is no distinct change in patient's strength.  Was able to tolerate eating last night.  Considering oncology is concern for patient having myelodysplastic syndrome back in 2014, myelodysplasia remains high on differential.  Also considered HIV and other infectious causes considering concerning plaques appreciated on patient's tongue (see photo), however HIV negative.  Folic acid within normal limits, vitamin B12 elevated, iron level normal however ferritin elevated at 964, CK within normal limits..  Possible pancreas involvement given grey stools and dark urine. Can consider testing for Ca 19-9. There is concern for refeeding syndrome given electrolyte abnormalities and poor PO intake. Considering nephrotic syndrome as possible cause of peripheral edema and low albumin.  -Vitals per floor routine -Follow-up UA, protein/creatinine ratio -Peripheral smear performed, pending review -Follow-up orthostatic vitals -Normal diet as tolerated -Avoid Zofran due to QTC 515 -F/u orthostatic Bps  -Follow-up hepatitis C -F/u Right upper quadrant ultrasound -Normal diet as tolerated, supplement with Ensure -SLP evaluation -Nutrition eval  Anemia, acute on chronic HbA1c 8.5 on admission, 8.4 this morning  (3/27).  Baseline about 8.  MCV normal at 95, iron levels normal, B12 elevated, folate within normal limits.  Recent colonoscopy November 2020 - for acute findings.  Anemia thought to be secondary to high likelihood of myelodysplastic syndrome as noticed in 2014. -Monitor CBC -Peripheral smear ordered, follow-up  Hypokalemia, resolved K2.6 on admission, 3.6 today status post repletion with 80 mEq potassium.  Most likely secondary to poor intake.  -Monitor with BMP -Replete as needed   Hypomagnesemia  Likely 2/2 chronic poor intake. Mg 1.6 on admission. Repleted with Mg 2g IV -Monitor   Sacral wound: grade 1 sacral ulcer appreciated on exam, no evidence of infection.  -Keep area clean and covered, add padding to protect her skin -Recommend frequent position changes while lying in bed -Encourage out of bed  QT prolongation EKG showed SR, PVCs, RBBB and QTc 579ms -Avoid QT prolonging medications  Hypocalcemia 7.5, corrects to 9.1 -Monitor calcium    Rheumatoid arthritis Ulnar deviation of MCP joints bilaterally. Does not have Rheumatologist and is not on DMARDs. -Monitor pain  9-can treat with Tylenol   FEN/GI: Normal diet as tolerated Prophylaxis: Lovenox   Disposition: Med-surg  Subjective:  Patient seen this morning resting in bed, very tired, reports that her sore but is still hurting, otherwise denies any concerns or complaints.  Specifically denies nausea, vomiting, abdominal pain.  Objective: Temp:  [98 F (36.7 C)-98.5 F (36.9 C)] 98.2 F (36.8 C) (03/27 0129) Pulse Rate:  [82-100] 97 (03/27 0129) Resp:  [14-19] 14 (03/26 2217) BP: (100-128)/(55-90) 105/63 (03/27 0129) SpO2:  [92 %-97 %] 96 % (03/26 2217) Weight:  [42.2 kg] 42.2 kg (03/26 1313) Physical Exam: General: Cachectic female, very pleasant patient Cardiovascular: RRR, S1-S2 present, no murmurs appreciated Respiratory: CTA bilaterally, normal work of breathing Abdomen: Soft,  nontender Extremities:  2+ pitting edema appreciated to bilateral lower extremities Neuro: Generalized weakness appreciated to upper and lower extremities, 4/5 strength  Laboratory: Recent Labs  Lab 12/12/19 1317 12/13/19 0224  WBC 5.7 7.2  HGB 8.5* 8.4*  HCT 26.7* 24.2*  PLT 159 179   Recent Labs  Lab 12/12/19 1317 12/12/19 1326 12/13/19 0224  NA 136  --  136  K 2.6*  --  3.6  CL 108  --  110  CO2 19*  --  18*  BUN 24*  --  20  CREATININE 0.86  --  0.79  CALCIUM 7.5*  --  7.3*  PROT  --  5.4* 5.2*  BILITOT  --  0.3 0.4  ALKPHOS  --  86 90  ALT  --  26 24  AST  --  21 21  GLUCOSE 94  --  100*    Imaging/Diagnostic Tests: MR LUMBAR SPINE:  WO CONTRAST  Result Date: 12/12/2019 CLINICAL DATA:  Patient reporting new numbness and weakness since 11/08/2019 MRI, L4 distribution numbness, poor rectal tone, poor strength; low back pain, cauda equina syndrome suspected. Additional history provided: Generalized weakness and hypokalemia, patient reports numbness from the waist down for the past month, multiple falls. EXAM: MRI LUMBAR SPINE WITHOUT CONTRAST TECHNIQUE: Multiplanar, multisequence MR imaging of the lumbar spine was performed. No intravenous contrast was administered. COMPARISON:  Lumbar spine MRI 11/10/2019. FINDINGS: Segmentation: For the purposes of this dictation, five lumbar vertebrae are assumed and the caudal most well-formed intervertebral disc is designated L5-S1. Alignment: Lumbar dextrocurvature. Unchanged 4 mm L4-L5 grade 1 anterolisthesis. Vertebrae: Vertebral body height is maintained. Redemonstrated diffusely heterogeneous marrow signal with a mottled appearance. Conus medullaris and cauda equina: Conus extends to the L1-L2 level. No signal abnormality within the visualized distal spinal cord. Paraspinal and other soft tissues: Nonspecific diffuse edema within the lumbar paraspinal soft tissues which may reflect a fluid overloaded state. Colonic diverticulosis. Disc  levels: Unless otherwise stated, the level by level findings below have not significantly changed since prior MRI 11/10/2019. Moderate disc degeneration throughout the lumbar spine, greatest at L4-L5. T12-L1: Small central disc protrusion. No significant spinal canal or foraminal stenosis. L1-L2: Disc bulge. Superimposed small right center disc protrusion. No significant spinal canal or foraminal stenosis. L2-L3: Disc bulge. Superimposed left center to left foraminal disc protrusion. Mild facet arthrosis. Unchanged left subarticular stenosis with slight crowding of the descending left L3 nerve root. Mild relative narrowing of the central canal. Moderate left neural foraminal narrowing. L3-L4: Disc bulge. Superimposed broad-based left center/foraminal disc protrusion. Mild facet arthrosis/ligamentum flavum hypertrophy. Left subarticular stenosis with crowding of the descending left L4 nerve root. Central canal patent. Moderate left neural foraminal narrowing. L4-L5: Grade 1 anterolisthesis. Disc uncovering and disc bulge. Prominent facet arthrosis with ligamentum flavum hypertrophy. Moderate/severe bilateral subarticular stenosis with crowding of the descending L5 nerve roots. Moderate central canal stenosis. Bilateral neural foraminal narrowing (mild right, moderate left). L5-S1: Disc bulge with endplate spurring. Superimposed moderate-sized central disc protrusion at site of posterior annular fissure. Mild facet arthrosis. Bilateral subarticular stenosis with crowding of the descending S1 nerve roots (greater on the left) (series 8, image 29). Moderate central canal stenosis. No significant foraminal narrowing. IMPRESSION: Lumbar spondylosis has not significantly changed as compared MRI 11/10/2019. Findings are most notably as follows. At L4-L5, grade 1 anterolisthesis, disc uncovering and disc bulge, prominent facet arthrosis, ligamentum flavum hypertrophy. Moderate/severe bilateral subarticular stenosis with  crowding of the descending L5 nerve roots. Moderate central canal stenosis. Bilateral neural  foraminal narrowing (mild right, moderate left). At L5-S1, a broad-based central disc protrusion contributes to multifactorial bilateral subarticular narrowing with crowding of the descending S1 nerve roots (greater on the left). Moderate central canal stenosis. Multifactorial left subarticular narrowing at L2-L3 and L3-L4 with crowding of the descending L3 and L4 nerve roots respectively. Redemonstrated diffuse heterogeneity of the osseous marrow signal. This finding has a broad range of differential causes, although in this patient may be secondary to chronic anemia. Electronically Signed   By: Kellie Simmering DO   On: 12/12/2019 16:39    Daisy Floro, DO 12/13/2019, 8:13 AM PGY-2, [place] Intern pager: [phone], text pages welcome

## 2019-12-13 NOTE — Progress Notes (Signed)
Pt off the unit and at ultrasound.

## 2019-12-13 NOTE — Progress Notes (Addendum)
FPTS Interim Progress Note  Patient seen and examined at bedside to assess strength.  Patient is overall weak, BUE strength is 4/5, BLE strength is 4/5, but BLE does seem slightly weaker than BUE.  Also has 2+ BLE pitting edema.  Patient's daughter presented to bedside and called patient's brother Dr. Jeanann Lewandowsky.  Spoke with patient's daughter and brother to update them on her current status.  Advised that electrolytes are being repleted.  Patient's brother, Dr. Carlis Abbott notes that patient has been having poor po intake for a few years, worsening in the last 5-6 months, especially in the last 1-2 weeks.  He reports that she has had esophageal strictures and dysmotility in the past and would appreciate a GI consultation.  Advised that they would be consulted.    Given her asymmetrical weakness, noted that imaging of C and T spine was being considered and will likely order.     Will also reach out to GI for consultation on Monday AM.  Sandi Carne, Bernita Raisin, DO 12/13/2019, 1:07 PM PGY-2, [place] [facility] Service pager [phone]

## 2019-12-13 NOTE — Progress Notes (Signed)
Initial Nutrition Assessment  RD working remotely.  DOCUMENTATION CODES:   Underweight  INTERVENTION:   -Boost Breeze po TID, each supplement provides 250 kcal and 9 grams of protein -MVI with minerals daily  NUTRITION DIAGNOSIS:   Inadequate oral intake related to decreased appetite as evidenced by per patient/family report.  GOAL:   Patient will meet greater than or equal to 90% of their needs  MONITOR:   PO intake, Supplement acceptance, Labs, Weight trends, Skin, I & O's  REASON FOR ASSESSMENT:   Malnutrition Screening Tool, Consult Assessment of nutrition requirement/status  ASSESSMENT:   Deanna Cunningham is a 74 y.o. female presenting with weakness. PMH is significant for Rheumatoid Arthritis (no tx, no rheumatologist), chronic anemia and GERD.  Pt admitted with anemia and malnutrition/ concern for malignancy.   Reviewed I/O's: +1.3 L x 24 hours  Per MD notes, suspect pt with history of myelodysplastic syndrome, however, has refused bone marrow biopsy in the past.   Spoke with pt over the phone, who was pleasant and in good spirits today. She reports feeling better this morning and is actually feeling hungry, which has been an unusual feeling for her as of late. Pt reports that she has experienced a general decline in health over the past year, related to decreased appetite and weakness. Pt shares she consumes 2 meals per day, which often consists of [employer] or [employer] chicken, however, she intermittently has difficulty keeping foods and liquids down. Pt is awaiting her breakfast, but reports consuming chicken noodle soup and pudding last night "which is the most I've eaten in a while".   Pt shares that her UBW is 109#, which she last weighed back in 2019. Pt explains that she has experienced a gradual weight loss over the past 2 years and estimates she is now about 93#. She also has been experiencing weakness and functional decline secondary to swelling in her  legs. 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   Per wt hx, pt has experienced a 3% wt loss over the past 3 months.  Pt eats with dentures, however, does not have the adhesive with her. Offered to downgrade her diet to softer consistency, however, pt politely declined, stating her daughter will be bring her adhesive when she visits later on today. Pt has been consuming Boost Plus supplements, however, with intermittent intolerance due to self reported lactose intolerance. Pt very eager to try Boost Breeze. Discussed importance of good meal and supplement intake to promote healing.   Highly suspect malnutrition, however, unable to identify at this time.   Labs reviewed: Phos: 2.3.   Diet Order:   Diet Order            Diet regular Room service appropriate? Yes; Fluid consistency: Thin  Diet effective now              EDUCATION NEEDS:   Education needs have been addressed  Skin:  Skin Assessment: Reviewed RN Assessment  Last BM:  12/13/19  Height:   Ht Readings from Last 1 Encounters:  12/12/19 '5\' 4"'$  (1.626 m)    Weight:   Wt Readings from Last 1 Encounters:  12/12/19 42.2 kg    Ideal Body Weight:  54.5 kg  BMI:  Body mass index is 15.96 kg/m.  Estimated Nutritional Needs:   Kcal:  1600-1800  Protein:  85-100 grams  Fluid:  > 1.6 L  [name], RD, LDN, [place] Registered Dietitian II Certified Diabetes Care and Education Specialist Please refer to [facility] for RD and/or RD on-call/weekend/after hours pager

## 2019-12-13 NOTE — Plan of Care (Signed)
New admission

## 2019-12-13 NOTE — Progress Notes (Signed)
PT Cancellation Note  Patient Details Name: Deanna Cunningham MRN: [id number] DOB: Feb 03, 1946   Cancelled Treatment:    Reason Eval/Treat Not Completed: Other (comment).  Multiple people in and out of pt's room.  She wants to finish breakfast (of note lunch is now here).  I will attempt back later as time allows.    Thanks,  Verdene Lennert, PT, DPT  Acute Rehabilitation [phone] pager #[phone] office       Barbarann Ehlers Meliton Samad 12/13/2019, 12:46 PM

## 2019-12-13 NOTE — Evaluation (Signed)
Physical Therapy Evaluation Patient Details Name: Deanna Cunningham MRN: [id number] DOB: 1946-08-11 Today's Date: 12/13/2019   History of Present Illness  74 y.o. female admitted on 12/12/19 for progressive weakness, poor po intake, bil LE swelling.  Pt dx with severe protein calorie malnutrition (concern for malignancy), medical workup in progress.  Pt also dx with anemia acute on chronic, hypokalemia, hypomagnesemia, stage 1 sacral wound, QT prolongation, and hypocalcemia.  Pt with significant PMH of dysphagia (s/p EGD with esophageal dialation), CKD III, anemia, RA.    Clinical Impression  Pt is weak with muscle wasting from malnutrition.  She was able to stand to the stedy standing frame with me and transfer OOB to the [facility] and then the recliner chair.  She was too scared that she would fall to try with the RW.  She is very weak.  I came at 1619 and she had just started eating her lunch (she was just finishing her breakfast when lunch came).  She reported, "I am a very slow eater".  She may need SNF for rehab prior to returning home as she will need physical assist any time she gets up and even to get up at home.  PT to follow acutely for deficits listed below.      Follow Up Recommendations SNF    Equipment Recommendations  3in1 (PT)    Recommendations for Other Services OT consult     Precautions / Restrictions Precautions Precautions: Fall Restrictions Weight Bearing Restrictions: No      Mobility  Bed Mobility Overal bed mobility: Needs Assistance Bed Mobility: Supine to Sit     Supine to sit: Min assist;HOB elevated     General bed mobility comments: Min assist to support trunk and assist in scooting hips to EOB  Transfers Overall transfer level: Needs assistance   Transfers: Sit to/from Stand;Stand Pivot Transfers Sit to Stand: Min assist;From elevated surface Stand pivot transfers: (sat on sara stedy)       General transfer comment: Used the sara stedy standing  frame to get pt up to [facility] and then recliner chair as she was fearful of falling.   Ambulation/Gait             General Gait Details: did not feel strong enough       Balance Overall balance assessment: Needs assistance Sitting-balance support: Feet supported;Bilateral upper extremity supported Sitting balance-Leahy Scale: Fair     Standing balance support: Bilateral upper extremity supported Standing balance-Leahy Scale: Poor Standing balance comment: needs external support in standing.                              Pertinent Vitals/Pain Pain Assessment: Faces Faces Pain Scale: Hurts little more Pain Location: buttocks Pain Descriptors / Indicators: Grimacing Pain Intervention(s): Limited activity within patient's tolerance;Monitored during session;Repositioned    Home Living Family/patient expects to be discharged to:: Private residence Living Arrangements: Children;Other (Comment)(daughter who works from home M-W) Available Help at Discharge: Family Type of Home: House Home Access: Stairs to enter Entrance Stairs-Rails: Right;Left;Can reach both Technical brewer of Steps: 2 [place]: Two level;Bed/bath upstairs Home Equipment: [place] - 2 wheels;Cane - single point;Wheelchair - manual      Prior Function Level of Independence: Independent with assistive device(s)         Comments: Pt reports just last month she was driving and working.  She works as a Secretary/administrator, but has been getting progressively weaker and off  balance leading up to the past week or two being nearly bed bound.       Hand Dominance   Dominant Hand: Right    Extremity/Trunk Assessment   Upper Extremity Assessment Upper Extremity Assessment: Generalized weakness    Lower Extremity Assessment Lower Extremity Assessment: Generalized weakness(bil LE pitting edema)    Cervical / Trunk Assessment Cervical / Trunk Assessment: Normal  Communication   Communication: No  difficulties  Cognition Arousal/Alertness: Awake/alert Behavior During Therapy: WFL for tasks assessed/performed Overall Cognitive Status: Within Functional Limits for tasks assessed                                 General Comments: Not specifically tested, at times self corrects when giving me answers to my home set up questions.              Assessment/Plan    PT Assessment Patient needs continued PT services  PT Problem List Decreased strength;Decreased activity tolerance;Decreased balance;Decreased mobility;Decreased knowledge of use of DME;Pain;Decreased skin integrity       PT Treatment Interventions DME instruction;Gait training;Stair training;Functional mobility training;Therapeutic activities;Therapeutic exercise;Balance training;Patient/family education;Wheelchair mobility training    PT Goals (Current goals can be found in the Care Plan section)  Acute Rehab PT Goals Patient Stated Goal: to get stronger, eat more PT Goal Formulation: With patient Time For Goal Achievement: 12/27/19 Potential to Achieve Goals: Good    Frequency Min 3X/week   Barriers to discharge Inaccessible home environment;Decreased caregiver support daughter works out of the home Th/Fri       AM-PAC PT "6 Clicks" Mobility  Outcome Measure Help needed turning from your back to your side while in a flat bed without using bedrails?: A Little Help needed moving from lying on your back to sitting on the side of a flat bed without using bedrails?: A Little Help needed moving to and from a bed to a chair (including a wheelchair)?: A Lot Help needed standing up from a chair using your arms (e.g., wheelchair or bedside chair)?: A Little Help needed to walk in hospital room?: Total Help needed climbing 3-5 steps with a railing? : Total 6 Click Score: 13    End of Session   Activity Tolerance: Patient limited by fatigue Patient left: in chair;with call bell/phone within reach Nurse  Communication: Mobility status;Need for lift equipment(to RN tech) PT Visit Diagnosis: Muscle weakness (generalized) (M62.81);Difficulty in walking, not elsewhere classified (R26.2);Pain Pain - Right/Left: (mid) Pain - part of body: (buttocks)    Time: [id number] PT Time Calculation (min) (ACUTE ONLY): 31 min   Charges:         Verdene Lennert, PT, DPT  Acute Rehabilitation [phone] pager #[phone] office     PT Evaluation $PT Eval Moderate Complexity: 1 Mod PT Treatments $Therapeutic Activity: 8-22 mins      12/13/2019, 4:59 PM

## 2019-12-13 NOTE — Plan of Care (Signed)
  Problem: Safety: Goal: Ability to remain free from injury will improve Outcome: Progressing   

## 2019-12-14 ENCOUNTER — Inpatient Hospital Stay (HOSPITAL_COMMUNITY): Payer: BC Managed Care – PPO

## 2019-12-14 DIAGNOSIS — R634 Abnormal weight loss: Secondary | ICD-10-CM

## 2019-12-14 DIAGNOSIS — E8809 Other disorders of plasma-protein metabolism, not elsewhere classified: Secondary | ICD-10-CM | POA: Diagnosis not present

## 2019-12-14 DIAGNOSIS — E876 Hypokalemia: Secondary | ICD-10-CM | POA: Diagnosis not present

## 2019-12-14 DIAGNOSIS — R531 Weakness: Secondary | ICD-10-CM | POA: Diagnosis not present

## 2019-12-14 LAB — MAGNESIUM: Magnesium: 1.7 mg/dL (ref 1.7–2.4)

## 2019-12-14 LAB — CBC
HCT: 21.3 % — ABNORMAL LOW (ref 36.0–46.0)
Hemoglobin: 7.1 g/dL — ABNORMAL LOW (ref 12.0–15.0)
MCH: 30 pg (ref 26.0–34.0)
MCHC: 33.3 g/dL (ref 30.0–36.0)
MCV: 89.9 fL (ref 80.0–100.0)
Platelets: 148 10*3/uL — ABNORMAL LOW (ref 150–400)
RBC: 2.37 MIL/uL — ABNORMAL LOW (ref 3.87–5.11)
RDW: 16.2 % — ABNORMAL HIGH (ref 11.5–15.5)
WBC: 5.7 10*3/uL (ref 4.0–10.5)
nRBC: 0 % (ref 0.0–0.2)

## 2019-12-14 LAB — BASIC METABOLIC PANEL
Anion gap: 3 — ABNORMAL LOW (ref 5–15)
BUN: 17 mg/dL (ref 8–23)
CO2: 20 mmol/L — ABNORMAL LOW (ref 22–32)
Calcium: 7.1 mg/dL — ABNORMAL LOW (ref 8.9–10.3)
Chloride: 112 mmol/L — ABNORMAL HIGH (ref 98–111)
Creatinine, Ser: 0.71 mg/dL (ref 0.44–1.00)
GFR calc Af Amer: >60 mL/min (ref >60)
GFR calc non Af Amer: >60 mL/min (ref >60)
Glucose, Bld: 73 mg/dL (ref 70–99)
Potassium: 3.6 mmol/L (ref 3.5–5.1)
Sodium: 135 mmol/L (ref 135–145)

## 2019-12-14 LAB — TSH: TSH: 3.445 u[IU]/mL (ref 0.350–4.500)

## 2019-12-14 LAB — PHOSPHORUS: Phosphorus: 1.6 mg/dL — ABNORMAL LOW (ref 2.5–4.6)

## 2019-12-14 MED ORDER — MAGNESIUM SULFATE IN D5W 1-5 GM/100ML-% IV SOLN
1.0000 g | Freq: Once | INTRAVENOUS | Status: AC
  Administered 2019-12-14: 1 g via INTRAVENOUS
  Filled 2019-12-14: qty 100

## 2019-12-14 MED ORDER — FLUCONAZOLE 100 MG PO TABS
100.0000 mg | ORAL_TABLET | Freq: Every day | ORAL | Status: DC
  Administered 2019-12-14: 100 mg via ORAL
  Filled 2019-12-14: qty 1

## 2019-12-14 MED ORDER — PROCHLORPERAZINE EDISYLATE 10 MG/2ML IJ SOLN
10.0000 mg | Freq: Four times a day (QID) | INTRAMUSCULAR | Status: DC | PRN
  Administered 2019-12-14 – 2019-12-16 (×2): 10 mg via INTRAVENOUS
  Filled 2019-12-14 (×2): qty 2

## 2019-12-14 MED ORDER — LIDOCAINE VISCOUS HCL 2 % MT SOLN
15.0000 mL | Freq: Once | OROMUCOSAL | Status: AC
  Administered 2019-12-14: 15 mL via OROMUCOSAL
  Filled 2019-12-14: qty 15

## 2019-12-14 MED ORDER — K PHOS MONO-SOD PHOS DI & MONO 155-852-130 MG PO TABS
250.0000 mg | ORAL_TABLET | Freq: Every day | ORAL | Status: AC
  Administered 2019-12-14: 250 mg via ORAL
  Filled 2019-12-14: qty 1

## 2019-12-14 MED ORDER — MAGNESIUM SULFATE 50 % IJ SOLN
1.0000 g | Freq: Once | INTRAMUSCULAR | Status: DC
  Filled 2019-12-14: qty 2

## 2019-12-14 MED ORDER — POTASSIUM CHLORIDE 20 MEQ PO PACK
40.0000 meq | PACK | Freq: Once | ORAL | Status: AC
  Administered 2019-12-14: 40 meq via ORAL
  Filled 2019-12-14: qty 2

## 2019-12-14 MED ORDER — BOOST PLUS PO LIQD
237.0000 mL | Freq: Three times a day (TID) | ORAL | Status: DC
  Filled 2019-12-14: qty 237

## 2019-12-14 MED ORDER — PANTOPRAZOLE SODIUM 40 MG PO TBEC
40.0000 mg | DELAYED_RELEASE_TABLET | Freq: Every day | ORAL | Status: DC
  Administered 2019-12-15 – 2019-12-16 (×2): 40 mg via ORAL
  Filled 2019-12-14 (×2): qty 1

## 2019-12-14 MED ORDER — MAGIC MOUTHWASH
10.0000 mL | Freq: Three times a day (TID) | ORAL | Status: DC | PRN
  Administered 2019-12-14 – 2019-12-15 (×2): 10 mL via ORAL
  Filled 2019-12-14 (×3): qty 10

## 2019-12-14 NOTE — Progress Notes (Signed)
Per CCMD pt had 14 runs of CVT with the HR of 165. Pt is asymptomatic; HR came down to 104. Family Medicine is notified.

## 2019-12-14 NOTE — Progress Notes (Addendum)
Interim progress note:  Tongue plaques - oral candidiasis: Received call from patient's nurse Rod Holler in regards to white plaques underneath patient's tongue.  On initial exam in the emergency department the plaques were unable to be scraped off with tongue depressor.  To definitively rule out candidal infection, I visited the hospital lab to pick up a slide and cover, then visited the patient to use another tongue depressor to take sample from patient's tongue, placed on the slide with cover and took it to the lab.  In the lab, staining was performed and yeast were visualized on microscopy. Spoke with pharmacy about managing patient's Oral Candidiasis given her EKG findings and electrolyte status. EKG on admission showed QTC prolonged at 515, sinus rhythm with occasional PVC and PAC, RBBB. Most recent magnesium 1.7, phosphorus low at 1.6 (taken before repleted today with 250mg  KPhosx1), potassium 3.6.   -Initiate fluconazole 100 mg. Ideally, patient will be started on 200 mg, however, due to her electrolyte abnormalities and small stature will start her at a lower dose -Give 1g magnesium IV and 40 mEq K-Dur due to prolonged QTC on EKG on admission, borderline low magnesium, borderline hypokalemia, and hypocalcemia.  Fluconazole has a very low risk of causing QTC prolongation, as do low magnesium, potassium, and calcium; however chances are higher in smaller patients.  Patient's hypocalcemia corrected for albumin is NORMAL at 9.3.  Therefore, plan to proactively replete patient's magnesium and potassium. -Repeat EKG   Stomach pain: While visiting the patient in her room she was sipping Breeze feeding supplement and struggling a little to do so.  Her instructions are to consume 4 cartons of Breeze within the next 12 hours, patient had already consumed one of them and was working on her second.  She states that she was having burning in her chest and in her stomach while consuming the supplements. Got a call  shortly after our discussion stating the patient had vomited. -Discussed doing Magic mouthwash for patient's acid reflux -Discussed lidocaine 2% p.o. swish and spit -Decrease rate of Breeze consumption/take a break -Compazine 10mg  x1 for N/V (avoiding Zofran due to QTc prolongation, but may try something else if repeat EKG normal) -Already on Protonix 40mg  (starting PO tomorrow morning)     Milus Banister, [facility], PGY-2 12/14/2019 11:14 PM

## 2019-12-14 NOTE — Plan of Care (Signed)
  Problem: Education: Goal: Knowledge of General Education information will improve Description: Including pain rating scale, medication(s)/side effects and non-pharmacologic comfort measures Outcome: Progressing   Problem: Clinical Measurements: Goal: Ability to maintain clinical measurements within normal limits will improve Outcome: Progressing   

## 2019-12-14 NOTE — Evaluation (Signed)
Occupational Therapy Evaluation Patient Details Name: Deanna Cunningham MRN: [id number] DOB: 1946/01/31 Today's Date: 12/14/2019    History of Present Illness 74 y.o. female admitted on 12/12/19 for progressive weakness, poor po intake, bil LE swelling.  Pt dx with severe protein calorie malnutrition (concern for malignancy), medical workup in progress.  Pt also dx with anemia acute on chronic, hypokalemia, hypomagnesemia, stage 1 sacral wound, QT prolongation, and hypocalcemia.  Pt with significant PMH of dysphagia (s/p EGD with esophageal dialation), CKD III, anemia, RA.     Clinical Impression   Pt admitted with above. She demonstrates the below listed deficits and will benefit from continued OT to maximize safety and independence with BADLs.  Pt presents to OT with generalized weakness, decreased activity tolerance, impaired balance.  She currently is able to feed self, but eats only small amount, and requires min - max A for remainder of ADLs.  She required use of stedy for OOB transfers and required min A intially to move sit to stand, but as she fatigued, required mod A.  She reports she lives with daughter who can assist as needed.  She reports she was fully independent a month, or so ago, but has had a steady decline in function and just before admission, required significant assist with all ADLs and functional mobility.       Follow Up Recommendations  Supervision/Assistance - 24 hour;Home health OT    Equipment Recommendations  3 in 1 bedside commode;Tub/shower bench    Recommendations for Other Services       Precautions / Restrictions Precautions Precautions: Fall      Mobility Bed Mobility Overal bed mobility: Needs Assistance Bed Mobility: Supine to Sit     Supine to sit: Min assist;HOB elevated     General bed mobility comments: assist to lift trunk   Transfers Overall transfer level: Needs assistance Equipment used: Ambulation equipment used Transfers: Sit  to/from [facility] Sit to Stand: Min guard;Mod assist Stand pivot transfers: Mod assist       General transfer comment: Clarise Cruz stedy was utilized.  Pt requires min A to stand from the bed and mod A to move sit to stand from 3in1 commode     Balance Overall balance assessment: Needs assistance Sitting-balance support: Feet supported;Bilateral upper extremity supported Sitting balance-Leahy Scale: Fair     Standing balance support: Bilateral upper extremity supported Standing balance-Leahy Scale: Poor Standing balance comment: requires UE support                            ADL either performed or assessed with clinical judgement   ADL Overall ADL's : Needs assistance/impaired Eating/Feeding: Independent   Grooming: Wash/dry hands;Wash/dry face;Oral care;Set up;Sitting   Upper Body Bathing: Minimal assistance;Sitting   Lower Body Bathing: Maximal assistance;Sit to/from stand   Upper Body Dressing : Moderate assistance;Sitting   Lower Body Dressing: Maximal assistance;Sit to/from stand   Toilet Transfer: Moderate assistance;Stand-pivot;BSC(stedy )   Toileting- Clothing Manipulation and Hygiene: Maximal assistance;Sit to/from stand       Functional mobility during ADLs: Moderate assistance(stedy ) General ADL Comments: Pt fatigues quickly      Vision Patient Visual Report: No change from baseline       Perception     Praxis      Pertinent Vitals/Pain Pain Assessment: Faces Faces Pain Scale: Hurts little more Pain Location: buttocks Pain Descriptors / Indicators: Grimacing Pain Intervention(s): Monitored during session;Limited activity within patient's  tolerance     Hand Dominance Right   Extremity/Trunk Assessment Upper Extremity Assessment Upper Extremity Assessment: Generalized weakness   Lower Extremity Assessment Lower Extremity Assessment: Generalized weakness   Cervical / Trunk Assessment Cervical / Trunk Assessment:  Normal   Communication Communication Communication: No difficulties   Cognition Arousal/Alertness: Awake/alert Behavior During Therapy: WFL for tasks assessed/performed Overall Cognitive Status: Within Functional Limits for tasks assessed                                 General Comments: grossly assessed    General Comments       Exercises     Shoulder Instructions      Home Living Family/patient expects to be discharged to:: Private residence Living Arrangements: Children;Other (Comment) Available Help at Discharge: Family Type of Home: House Home Access: Stairs to enter [employer] of Steps: 2 Entrance Stairs-Rails: Right;Left;Can reach both Home Layout: Two level;Bed/bath upstairs Alternate Level Stairs-Number of Steps: flight Alternate Level Stairs-Rails: Left Bathroom Shower/Tub: Teacher, early years/pre: Standard     Home Equipment: Environmental consultant - 2 wheels;Cane - single point;Wheelchair - manual   Additional Comments: her daughter will provide assist at [employer]       Prior Functioning/Environment Level of Independence: Independent with assistive device(s)        Comments: Pt reports just last month she was driving and working.  She works as a Secretary/administrator, but has been getting progressively weaker and off balance leading up to the past week or two being nearly bed bound.          OT Problem List: Decreased strength;Decreased activity tolerance;Impaired balance (sitting and/or standing);Decreased knowledge of use of DME or AE;Pain      OT Treatment/Interventions: Self-care/ADL training;Therapeutic exercise;Energy conservation;DME and/or AE instruction;Therapeutic activities;Patient/family education;Balance training    OT Goals(Current goals can be found in the care plan section) Acute Rehab OT Goals Patient Stated Goal: to be able to do more  OT Goal Formulation: With patient Time For Goal Achievement: 12/28/19 Potential to  Achieve Goals: Good ADL Goals Pt Will Perform Grooming: with min assist;standing Pt Will Perform Upper Body Bathing: with set-up;sitting Pt Will Perform Lower Body Bathing: with min assist;sit to/from stand Pt Will Perform Upper Body Dressing: with set-up;with supervision;sitting Pt Will Perform Lower Body Dressing: with min assist;sit to/from stand;with adaptive equipment Pt Will Transfer to Toilet: with mod assist;ambulating;regular height toilet;bedside commode;grab bars Pt Will Perform Toileting - Clothing Manipulation and hygiene: with min assist;sit to/from stand Pt/caregiver will Perform Home Exercise Program: Increased strength;Right Upper extremity;Left upper extremity;With Supervision;With written HEP provided  OT Frequency: Min 2X/week   Barriers to D/C:            Co-evaluation              AM-PAC OT "6 Clicks" Daily Activity     Outcome Measure Help from another person eating meals?: None Help from another person taking care of personal grooming?: A Little Help from another person toileting, which includes using toliet, bedpan, or urinal?: A Lot Help from another person bathing (including washing, rinsing, drying)?: A Lot Help from another person to put on and taking off regular upper body clothing?: A Lot Help from another person to put on and taking off regular lower body clothing?: Total 6 Click Score: 14   End of Session Equipment Utilized During Treatment: Gait belt;Other (comment)(stedy) Nurse Communication: Mobility status  Activity  Tolerance: Patient limited by fatigue Patient left: in chair;with call bell/phone within reach  OT Visit Diagnosis: Unsteadiness on feet (R26.81);Repeated falls (R29.6);Muscle weakness (generalized) (M62.81);Pain Pain - part of body: (buttocks )                Time: [id number] OT Time Calculation (min): 37 min Charges:  OT General Charges $OT Visit: 1 Visit OT Evaluation $OT Eval Moderate Complexity: 1 Mod OT  Treatments $Self Care/Home Management : 23-37 mins  Nilsa Nutting., OTR/L Acute Rehabilitation Services Pager [phone] Office [phone]   Lucille Passy M 12/14/2019, 4:31 PM

## 2019-12-14 NOTE — Progress Notes (Signed)
FPTS Interim Progress Note  Received page from RN regarding elevated HR and SVT on tele. Per RN patient with 14 run SVT with HR at 165. Per RN patient now asymptomatic with HR @ 104.   I went to examine patient. Patient resting comfortably in bed, currently on bedtime having BM. Patient reports no CP or palpitations. Denies SOB.  States she was asleep earlier so was unaware her tele strip was abnormal. Patient states she overall feels well   O: BP 100/60 (BP Location: Left Arm)   Pulse 72   Temp 99.1 F (37.3 C) (Oral)   Resp 16   Ht 5\' 4"  (1.626 m)   Wt 42.2 kg   SpO2 96%   BMI 15.97 kg/m   Gen: awake and alert, NAD Cardio: regular rhythm, tachycardic, monitor reading 103 Resp: CTAB   A/P: Abnormal tele -continue cardiac monitoring -stat EKG -advised to repeat full vitals  -will order AM labs to be drawn now including Mg, BMP, CBC, phos -if continued SVT on tele or symptomatic can consider cards consult   Caroline More, DO 12/14/2019, 3:43 AM PGY-3, [facility] Service pager [phone]

## 2019-12-14 NOTE — Plan of Care (Signed)
  Problem: Coping: Goal: Level of anxiety will decrease Outcome: Progressing   Problem: Pain Managment: Goal: General experience of comfort will improve Outcome: Progressing   

## 2019-12-14 NOTE — Progress Notes (Signed)
Family Medicine Teaching Service Daily Progress Note Intern Pager: [phone]  Patient name: Deanna Cunningham Medical record number: [id number] Date of birth: 1946/06/24 Age: 74 y.o. Gender: female  Primary Care Provider: Kristie Cowman, MD Consultants: None, consider reaching out to patient's orthopedist Code Status: Full code  Pt Overview and Major Events to Date:  3/26-patient admitted for weakness, malnutrition  Assessment and Plan: Deanna Cunningham is a 74 y.o. female presenting with weakness. PMH is significant for Rheumatoid Arthritis (no tx, no rheumatologist), chronic anemia and GERD.  Severe protein calorie malnutrition, concern for malignancy. Patulous distal esophagus on abdominal CT 07/2018 consistent with EGD findings 2015. Endorses difficulty with swallowing but is usually able to compensate. Has some nausea and vomiting occassionally post-prandially. Denies abdominal pain with eating but has increased belching. Patient has been eating ~100% of meals. SEVERE muscle wasting diffusely. Consider abdominal CT, barium swallow for further evaluation. RUQ was unrevealing. Consider PEG to improve nutritional intake. Patient unable to complete standing orthostatics.  - consulting GI in the am -Vitals per floor routine -Follow-up UA, protein/creatinine ratio -Peripheral smear performed, pending review -Follow-up hepatitis C -Normal diet as tolerated, supplement with Ensure -SLP evaluation -Nutrition eval  Anemia, acute on chronic Anemia thought to be secondary to high likelihood of myelodysplastic syndrome as noticed in 2014. -Monitor CBC -Peripheral smear ordered, follow-up  Electrolyte abnormalities- phosphorous 1.6 today, Mg 1.7, K 3.6, calcium 7.1 today (no albumin but was 1.3 yesterday so corrects to ~8.9) - replete phosphorous - repeat labs in am including CMP  Sacral wound- stable -Keep area clean and covered, add padding to protect her skin -Recommend frequent  position changes while lying in bed -Encourage out of bed  QT prolongation EKG showed SR, PVCs, RBBB and QTc 571ms -Avoid QT prolonging medications  Rheumatoid arthritis Ulnar deviation of MCP joints bilaterally. Does not have Rheumatologist and is not on DMARDs. -Monitor pain  9-can treat with Tylenol  FEN/GI: Normal diet as tolerated Prophylaxis: Lovenox   Disposition: Med-surg  Subjective:  Patient is feeling overall well today and is eating breakfast on exam.   Objective: Temp:  [98.2 F (36.8 C)-99.1 F (37.3 C)] 98.6 F (37 C) (03/28 0330) Pulse Rate:  [72-102] 98 (03/28 0607) Resp:  [14-16] 16 (03/28 0607) BP: (97-110)/(55-68) 97/55 (03/28 0330) SpO2:  [95 %-100 %] 100 % (03/28 0607) Weight:  [42.2 kg] 42.2 kg (03/27 1400) Physical Exam: General: Cachectic female, very pleasant Cardiovascular: RRR, S1-S2 present, no murmurs appreciated Respiratory: CTAB, normal work of breathing Abdomen: Soft, nontender Extremities: 2+ pitting edema appreciated to bilateral lower extremities and tender to palpation equally bilaterally. Negative erythema Neuro: alert and oriented. Generalized weakness appreciated to upper and lower extremities, 4/5 strength  Laboratory: Recent Labs  Lab 12/13/19 0224 12/13/19 1524 12/14/19 0640  WBC 7.2 6.5 5.7  HGB 8.4* 8.1* 7.1*  HCT 24.2* 23.8* 21.3*  PLT 179 185 148*   Recent Labs  Lab 12/12/19 1317 12/12/19 1326 12/13/19 0224 12/14/19 0640  NA 136  --  136 135  K 2.6*  --  3.6 3.6  CL 108  --  110 112*  CO2 19*  --  18* 20*  BUN 24*  --  20 17  CREATININE 0.86  --  0.79 0.71  CALCIUM 7.5*  --  7.3* 7.1*  PROT  --  5.4* 5.2*  --   BILITOT  --  0.3 0.4  --   ALKPHOS  --  86 90  --   ALT  --  26 24  --   AST  --  21 21  --   GLUCOSE 94  --  100* 73    Imaging/Diagnostic Tests: MR LUMBAR SPINE:  WO CONTRAST  Result Date: 12/12/2019 CLINICAL DATA:  Patient reporting new numbness and weakness since 11/08/2019 MRI, L4  distribution numbness, poor rectal tone, poor strength; low back pain, cauda equina syndrome suspected. Additional history provided: Generalized weakness and hypokalemia, patient reports numbness from the waist down for the past month, multiple falls. EXAM: MRI LUMBAR SPINE WITHOUT CONTRAST TECHNIQUE: Multiplanar, multisequence MR imaging of the lumbar spine was performed. No intravenous contrast was administered. COMPARISON:  Lumbar spine MRI 11/10/2019. FINDINGS: Segmentation: For the purposes of this dictation, five lumbar vertebrae are assumed and the caudal most well-formed intervertebral disc is designated L5-S1. Alignment: Lumbar dextrocurvature. Unchanged 4 mm L4-L5 grade 1 anterolisthesis. Vertebrae: Vertebral body height is maintained. Redemonstrated diffusely heterogeneous marrow signal with a mottled appearance. Conus medullaris and cauda equina: Conus extends to the L1-L2 level. No signal abnormality within the visualized distal spinal cord. Paraspinal and other soft tissues: Nonspecific diffuse edema within the lumbar paraspinal soft tissues which may reflect a fluid overloaded state. Colonic diverticulosis. Disc levels: Unless otherwise stated, the level by level findings below have not significantly changed since prior MRI 11/10/2019. Moderate disc degeneration throughout the lumbar spine, greatest at L4-L5. T12-L1: Small central disc protrusion. No significant spinal canal or foraminal stenosis. L1-L2: Disc bulge. Superimposed small right center disc protrusion. No significant spinal canal or foraminal stenosis. L2-L3: Disc bulge. Superimposed left center to left foraminal disc protrusion. Mild facet arthrosis. Unchanged left subarticular stenosis with slight crowding of the descending left L3 nerve root. Mild relative narrowing of the central canal. Moderate left neural foraminal narrowing. L3-L4: Disc bulge. Superimposed broad-based left center/foraminal disc protrusion. Mild facet  arthrosis/ligamentum flavum hypertrophy. Left subarticular stenosis with crowding of the descending left L4 nerve root. Central canal patent. Moderate left neural foraminal narrowing. L4-L5: Grade 1 anterolisthesis. Disc uncovering and disc bulge. Prominent facet arthrosis with ligamentum flavum hypertrophy. Moderate/severe bilateral subarticular stenosis with crowding of the descending L5 nerve roots. Moderate central canal stenosis. Bilateral neural foraminal narrowing (mild right, moderate left). L5-S1: Disc bulge with endplate spurring. Superimposed moderate-sized central disc protrusion at site of posterior annular fissure. Mild facet arthrosis. Bilateral subarticular stenosis with crowding of the descending S1 nerve roots (greater on the left) (series 8, image 29). Moderate central canal stenosis. No significant foraminal narrowing. IMPRESSION: Lumbar spondylosis has not significantly changed as compared MRI 11/10/2019. Findings are most notably as follows. At L4-L5, grade 1 anterolisthesis, disc uncovering and disc bulge, prominent facet arthrosis, ligamentum flavum hypertrophy. Moderate/severe bilateral subarticular stenosis with crowding of the descending L5 nerve roots. Moderate central canal stenosis. Bilateral neural foraminal narrowing (mild right, moderate left). At L5-S1, a broad-based central disc protrusion contributes to multifactorial bilateral subarticular narrowing with crowding of the descending S1 nerve roots (greater on the left). Moderate central canal stenosis. Multifactorial left subarticular narrowing at L2-L3 and L3-L4 with crowding of the descending L3 and L4 nerve roots respectively. Redemonstrated diffuse heterogeneity of the osseous marrow signal. This finding has a broad range of differential causes, although in this patient may be secondary to chronic anemia. Electronically Signed   By: Kellie Simmering DO   On: 12/12/2019 16:39    Richarda Osmond, DO 12/14/2019, 8:14 AM PGY-2,  [place] Intern pager: [phone], text pages welcome

## 2019-12-14 NOTE — Progress Notes (Signed)
Calorie Count Note  48 hour calorie count ordered.  RD assessed pt yesterday (see progress noted on 12/13/19 for further details). Will initiate calorie count per MD request.   RD will follow-up for calorie count results on Tuesday, 12/16/19.   [name], RD, LDN, [place] Registered Dietitian II Certified Diabetes Care and Education Specialist Please refer to [facility] for RD and/or RD on-call/weekend/after hours pager

## 2019-12-15 DIAGNOSIS — E46 Unspecified protein-calorie malnutrition: Secondary | ICD-10-CM | POA: Diagnosis not present

## 2019-12-15 DIAGNOSIS — E8809 Other disorders of plasma-protein metabolism, not elsewhere classified: Secondary | ICD-10-CM | POA: Diagnosis not present

## 2019-12-15 DIAGNOSIS — E876 Hypokalemia: Secondary | ICD-10-CM | POA: Diagnosis not present

## 2019-12-15 LAB — CBC
HCT: 21.3 % — ABNORMAL LOW (ref 36.0–46.0)
Hemoglobin: 7.1 g/dL — ABNORMAL LOW (ref 12.0–15.0)
MCH: 29.8 pg (ref 26.0–34.0)
MCHC: 33.3 g/dL (ref 30.0–36.0)
MCV: 89.5 fL (ref 80.0–100.0)
Platelets: 169 10*3/uL (ref 150–400)
RBC: 2.38 MIL/uL — ABNORMAL LOW (ref 3.87–5.11)
RDW: 16 % — ABNORMAL HIGH (ref 11.5–15.5)
WBC: 6.6 10*3/uL (ref 4.0–10.5)
nRBC: 0 % (ref 0.0–0.2)

## 2019-12-15 LAB — MAGNESIUM
Magnesium: 1.9 mg/dL (ref 1.7–2.4)
Magnesium: 1.8 mg/dL (ref 1.7–2.4)

## 2019-12-15 LAB — BASIC METABOLIC PANEL
Anion gap: 3 — ABNORMAL LOW (ref 5–15)
BUN: 13 mg/dL (ref 8–23)
CO2: 20 mmol/L — ABNORMAL LOW (ref 22–32)
Calcium: 6.9 mg/dL — ABNORMAL LOW (ref 8.9–10.3)
Chloride: 109 mmol/L (ref 98–111)
Creatinine, Ser: 0.7 mg/dL (ref 0.44–1.00)
GFR calc Af Amer: >60 mL/min (ref >60)
GFR calc non Af Amer: >60 mL/min (ref >60)
Glucose, Bld: 129 mg/dL — ABNORMAL HIGH (ref 70–99)
Potassium: 4 mmol/L (ref 3.5–5.1)
Sodium: 132 mmol/L — ABNORMAL LOW (ref 135–145)

## 2019-12-15 LAB — GLIADIN ANTIBODIES, SERUM
Antigliadin Abs, IgA: 6 units (ref 0–19)
Gliadin IgG: 2 units (ref 0–19)

## 2019-12-15 LAB — RENAL FUNCTION PANEL
Albumin: 1.1 g/dL — ABNORMAL LOW (ref 3.5–5.0)
Anion gap: 5 (ref 5–15)
BUN: 14 mg/dL (ref 8–23)
CO2: 20 mmol/L — ABNORMAL LOW (ref 22–32)
Calcium: 7.1 mg/dL — ABNORMAL LOW (ref 8.9–10.3)
Chloride: 110 mmol/L (ref 98–111)
Creatinine, Ser: 0.59 mg/dL (ref 0.44–1.00)
GFR calc Af Amer: >60 mL/min (ref >60)
GFR calc non Af Amer: >60 mL/min (ref >60)
Glucose, Bld: 93 mg/dL (ref 70–99)
Phosphorus: 1.5 mg/dL — ABNORMAL LOW (ref 2.5–4.6)
Potassium: 3.4 mmol/L — ABNORMAL LOW (ref 3.5–5.1)
Sodium: 135 mmol/L (ref 135–145)

## 2019-12-15 LAB — LACTATE DEHYDROGENASE: LDH: 143 U/L (ref 98–192)

## 2019-12-15 LAB — TISSUE TRANSGLUTAMINASE, IGA: Tissue Transglutaminase Ab, IgA: <2 U/mL (ref 0–3)

## 2019-12-15 LAB — SEDIMENTATION RATE: Sed Rate: 15 mm/hr (ref 0–22)

## 2019-12-15 LAB — PATHOLOGIST SMEAR REVIEW

## 2019-12-15 LAB — PHOSPHORUS: Phosphorus: 3.4 mg/dL (ref 2.5–4.6)

## 2019-12-15 MED ORDER — MAGNESIUM SULFATE 2 GM/50ML IV SOLN
2.0000 g | Freq: Once | INTRAVENOUS | Status: AC
  Administered 2019-12-15: 2 g via INTRAVENOUS
  Filled 2019-12-15: qty 50

## 2019-12-15 MED ORDER — ORAL CARE MOUTH RINSE
15.0000 mL | Freq: Two times a day (BID) | OROMUCOSAL | Status: DC
  Administered 2019-12-15 – 2020-02-10 (×109): 15 mL via OROMUCOSAL

## 2019-12-15 MED ORDER — POTASSIUM PHOSPHATES 15 MMOLE/5ML IV SOLN
30.0000 mmol | Freq: Once | INTRAVENOUS | Status: AC
  Administered 2019-12-15: 30 mmol via INTRAVENOUS
  Filled 2019-12-15: qty 10

## 2019-12-15 MED ORDER — NYSTATIN 100000 UNIT/ML MT SUSP
5.0000 mL | Freq: Four times a day (QID) | OROMUCOSAL | Status: AC
  Administered 2019-12-15 – 2019-12-19 (×15): 500000 [IU] via ORAL
  Filled 2019-12-15 (×20): qty 5

## 2019-12-15 NOTE — Progress Notes (Signed)
FPTS Interim Progress Note  Spoke with Dr. Collene Mares who sees patient as outpatient.  She reports that patient has had extensive work-up as outpatient.  She recommends continuing diflucan for oral (and possibly esophageal candidiasis), getting nutrition consult, and she will see patient and discuss with the family.  Dr. Collene Mares does not believe that any GI workup needs to be done as an inpatient.    Dr. Collene Mares plans to see, appreciate her consultation   Cleophas Dunker, DO 12/15/2019, 8:46 AM PGY-2, [place] Service pager [phone]

## 2019-12-15 NOTE — TOC Initial Note (Signed)
Transition of Care [facility]) - Initial/Assessment Note    Patient Details  Name: Deanna Cunningham MRN: [id number] Date of Birth: 1946-08-29  Transition of [facility]) CM/SW Contact:    Deanna Mons, RN Phone [phone] 12/15/2019, 9:57 PM  Clinical Narrative:                 Late entry: 12/14/2019 @ 10:00 am  Presents with c/o of weakness, hx of  Rheumatoid Arthritis,chronic anemiaandGERD. From home with daughter, Deanna Cunningham.  NCM spoke with pt @ bedside with daughter on speaker phone regarding d/cplanning. NCM shared PT's evaluation /recommemdation for SNF placement. Daughter declined SNF placement for mom. Daughter states she currently works remotely from home and would like for mom to come home with [facility] services. Choice list /medicare star ratings for [facility] agencies provided to pt for reviewing and to share with daughter.  Pt states owns cane and walker.   TOC team will continue to monitor and follow for  TOC needs...   Expected Discharge Plan: [place] Barriers to Discharge: Continued Medical Work up   Patient Goals and CMS Choice Patient states their goals for this hospitalization and ongoing recovery are:: to feel better CMS Medicare.gov Compare Post Acute Care list provided to:: Patient Choice offered to / list presented to : Patient  Expected Discharge Plan and Services Expected Discharge Plan: [place]   Discharge Planning Services: CM Consult   Living arrangements for the past 2 months: Single Family Home                                      Prior Living Arrangements/Services Living arrangements for the past 2 months: Single Family Home Lives with:: Adult Children Patient language and need for interpreter reviewed:: Yes Do you feel safe going back to the place where you live?: Yes      Need for Family Participation in Patient Care: Yes (Comment) Care giver support system in place?: Yes (comment)    Criminal Activity/Legal Involvement Pertinent to Current Situation/Hospitalization: No - Comment as needed  Activities of Daily Living Home Assistive Devices/Equipment: Dentures (specify type) ADL Screening (condition at time of admission) Patient's cognitive ability adequate to safely complete daily activities?: Yes Is the patient deaf or have difficulty hearing?: No Does the patient have difficulty seeing, even when wearing glasses/contacts?: No Does the patient have difficulty concentrating, remembering, or making decisions?: No Patient able to express need for assistance with ADLs?: No Does the patient have difficulty dressing or bathing?: No Independently performs ADLs?: Yes (appropriate for developmental age) Does the patient have difficulty walking or climbing stairs?: Yes Weakness of Legs: Both Weakness of Arms/Hands: None  Permission Sought/Granted Permission sought to share information with : Facility Sport and exercise psychologist Permission granted to share information with : Yes, Verbal Permission Granted  Share Information with NAME: Deanna Cunningham (Daughter) [phone]           Emotional Assessment Appearance:: Appears stated age Attitude/Demeanor/Rapport: Engaged Affect (typically observed): Accepting Orientation: : Oriented to Situation, Oriented to  Time, Oriented to Place, Oriented to Self Alcohol / Substance Use: Not Applicable Psych Involvement: No (comment)  Admission diagnosis:  Protein malnutrition ([place]) [E46] Hypokalemia [E87.6] Hypomagnesemia [E83.42] Malnutrition ([facility]) [E46] Hypoalbuminemia [E88.09] Weakness [R53.1] Weight loss [R63.4] Patient Active Problem List   Diagnosis Date Noted  . Malnutrition ([place]) 12/12/2019  . Syncope and collapse 05/17/2013  .  Right bundle branch block 05/17/2013  . Chronic anemia 05/17/2013  . GERD (gastroesophageal reflux disease) 05/17/2013   PCP:  Deanna Cowman, MD Pharmacy:   [id number] - [place], [address] [place] [address] Phone: [phone] Fax: [phone]     Social Determinants of Health (SDOH) Interventions    Readmission Risk Interventions No flowsheet data found.

## 2019-12-15 NOTE — Plan of Care (Signed)

## 2019-12-15 NOTE — Progress Notes (Addendum)
Family Medicine Teaching Service Daily Progress Note Intern Pager: [phone]  Patient name: Deanna Cunningham Medical record number: [id number] Date of birth: 1946-09-03 Age: 74 y.o. Gender: female  Primary Care Provider: Kristie Cowman, MD Consultants: None, consider reaching out to patient's orthopedist Code Status: Full code  Pt Overview and Major Events to Date:  3/26-patient admitted for weakness, malnutrition  Assessment and Plan: Deanna Cunningham is a 74 y.o. female presenting with weakness. PMH is significant for Rheumatoid Arthritis (no tx, no rheumatologist), chronic anemia and GERD.  Severe protein calorie malnutrition, concern for malignancy.  This morning was eating breakfast when I went to see her. She had already eaten whole bowl of oatmeal and was eating omelette. States that she feels better today but did have some vomiting with the boost shakes . Pt has eaten 100% of meals per flowsheet. Patulous distal esophagus on abdominal CT 07/2018 consistent with EGD findings 2015.   RUQ [place] was unrevealing. -Consulting GI today, patient is seeing Dr Collene Mares as outpatient and reports that she has had an extensive workup as OP. Per GI does not require further further IP investigations.  Recommends continuing Fluconazole for oral candidiasis and getting nutrition consult.  -Vitals per floor routine -Follow-up UA, protein/creatinine ratio -Peripheral smear performed, pending review -Follow-up hepatitis C -Normal diet as tolerated, supplement with Breeze shakes  -SLP evaluation -Nutrition eval  Oral candidiasis White plaques underneath patient's tongue. KOH test yesterday: positive for yeast. QTC 515>505. Using fluconazole at low dose currently due to prolonged QTc -Started on Fluconazole 100mg  on 3/28 however switched to Nystatin QID today due to concern for prolonged QTc.   Anemia, acute on chronic Hb 7.1, 7.1 on 3/28  Anemia thought to be secondary to high likelihood of  myelodysplastic syndrome as noticed in 2014. -Monitor CBC -Peripheral smear ordered, follow-up  Electrolyte abnormalities likely refeeding syndrome  K 3.4 today, Phosph 1.6>1.5, repleted with Kphosp 25mmol one dose today Mg 1.7>1.9, repleted with 2 g Mg today  Calcium 7.1 today (no albumin but was 1.3 yesterday so corrects to ~8.9) -Replete phosphorous, KCl and Mg as necessary  -Repeat labs this evening to check electrolytes again   Sacral wound Stable -Keep area clean and covered, add padding to protect her skin -Recommend frequent position changes while lying in bed -Encourage out of bed  QT prolongation EKG showed SR, PVCs, RBBB and QTc 515>505 -Avoid QT prolonging medications  Rheumatoid arthritis Ulnar deviation of MCP joints bilaterally. Does not have Rheumatologist and is not on DMARDs. -Monitor pain   -Ambulatory referral to Rheum on discharge   FEN/GI: Normal diet as tolerated Prophylaxis: Lovenox   Disposition: Med-surg  Subjective:  Doing well and eating more. Reports burning sensation and vomiting after breeze shakes.   Objective: Temp:  [98.4 F (36.9 C)-99.3 F (37.4 C)] 98.5 F (36.9 C) (03/29 0235) Pulse Rate:  [91-105] 105 (03/29 0235) Resp:  [15-18] 15 (03/29 0235) BP: (93-102)/(51-55) 102/51 (03/29 0235) SpO2:  [91 %-100 %] 99 % (03/29 0235)   Physical Exam: General: Alert, cachectic appearing AA female, pleasant, sitting up eating breakfast  Cardio: Normal S1 and S2, RRR. Pulm: CTAB, normal WOB  Abdomen: Bowel sounds normal. Abdomen soft and non-tender.  Extremities: 2+ peripheral edema. Warm/ well perfused.  Strong radial pulse.  Neuro: Cranial nerves grossly intact  Laboratory: Recent Labs  Lab 12/13/19 1524 12/14/19 0640 12/15/19 0513  WBC 6.5 5.7 6.6  HGB 8.1* 7.1* 7.1*  HCT 23.8* 21.3* 21.3*  PLT 185 148*  169   Recent Labs  Lab 12/12/19 1317 12/12/19 1326 12/13/19 0224 12/14/19 0640 12/15/19 0513  NA   < >  --  136 135  135  K   < >  --  3.6 3.6 3.4*  CL   < >  --  110 112* 110  CO2   < >  --  18* 20* 20*  BUN   < >  --  20 17 14   CREATININE   < >  --  0.79 0.71 0.59  CALCIUM   < >  --  7.3* 7.1* 7.1*  PROT  --  5.4* 5.2*  --   --   BILITOT  --  0.3 0.4  --   --   ALKPHOS  --  86 90  --   --   ALT  --  26 24  --   --   AST  --  21 21  --   --   GLUCOSE   < >  --  100* 73 93   < > = values in this interval not displayed.    Imaging/Diagnostic Tests: MR LUMBAR SPINE:  WO CONTRAST  Result Date: 12/12/2019 CLINICAL DATA:  Patient reporting new numbness and weakness since 11/08/2019 MRI, L4 distribution numbness, poor rectal tone, poor strength; low back pain, cauda equina syndrome suspected. Additional history provided: Generalized weakness and hypokalemia, patient reports numbness from the waist down for the past month, multiple falls. EXAM: MRI LUMBAR SPINE WITHOUT CONTRAST TECHNIQUE: Multiplanar, multisequence MR imaging of the lumbar spine was performed. No intravenous contrast was administered. COMPARISON:  Lumbar spine MRI 11/10/2019. FINDINGS: Segmentation: For the purposes of this dictation, five lumbar vertebrae are assumed and the caudal most well-formed intervertebral disc is designated L5-S1. Alignment: Lumbar dextrocurvature. Unchanged 4 mm L4-L5 grade 1 anterolisthesis. Vertebrae: Vertebral body height is maintained. Redemonstrated diffusely heterogeneous marrow signal with a mottled appearance. Conus medullaris and cauda equina: Conus extends to the L1-L2 level. No signal abnormality within the visualized distal spinal cord. Paraspinal and other soft tissues: Nonspecific diffuse edema within the lumbar paraspinal soft tissues which may reflect a fluid overloaded state. Colonic diverticulosis. Disc levels: Unless otherwise stated, the level by level findings below have not significantly changed since prior MRI 11/10/2019. Moderate disc degeneration throughout the lumbar spine, greatest at L4-L5. T12-L1:  Small central disc protrusion. No significant spinal canal or foraminal stenosis. L1-L2: Disc bulge. Superimposed small right center disc protrusion. No significant spinal canal or foraminal stenosis. L2-L3: Disc bulge. Superimposed left center to left foraminal disc protrusion. Mild facet arthrosis. Unchanged left subarticular stenosis with slight crowding of the descending left L3 nerve root. Mild relative narrowing of the central canal. Moderate left neural foraminal narrowing. L3-L4: Disc bulge. Superimposed broad-based left center/foraminal disc protrusion. Mild facet arthrosis/ligamentum flavum hypertrophy. Left subarticular stenosis with crowding of the descending left L4 nerve root. Central canal patent. Moderate left neural foraminal narrowing. L4-L5: Grade 1 anterolisthesis. Disc uncovering and disc bulge. Prominent facet arthrosis with ligamentum flavum hypertrophy. Moderate/severe bilateral subarticular stenosis with crowding of the descending L5 nerve roots. Moderate central canal stenosis. Bilateral neural foraminal narrowing (mild right, moderate left). L5-S1: Disc bulge with endplate spurring. Superimposed moderate-sized central disc protrusion at site of posterior annular fissure. Mild facet arthrosis. Bilateral subarticular stenosis with crowding of the descending S1 nerve roots (greater on the left) (series 8, image 29). Moderate central canal stenosis. No significant foraminal narrowing. IMPRESSION: Lumbar spondylosis has not significantly changed as compared MRI  11/10/2019. Findings are most notably as follows. At L4-L5, grade 1 anterolisthesis, disc uncovering and disc bulge, prominent facet arthrosis, ligamentum flavum hypertrophy. Moderate/severe bilateral subarticular stenosis with crowding of the descending L5 nerve roots. Moderate central canal stenosis. Bilateral neural foraminal narrowing (mild right, moderate left). At L5-S1, a broad-based central disc protrusion contributes to  multifactorial bilateral subarticular narrowing with crowding of the descending S1 nerve roots (greater on the left). Moderate central canal stenosis. Multifactorial left subarticular narrowing at L2-L3 and L3-L4 with crowding of the descending L3 and L4 nerve roots respectively. Redemonstrated diffuse heterogeneity of the osseous marrow signal. This finding has a broad range of differential causes, although in this patient may be secondary to chronic anemia. Electronically Signed   By: Kellie Simmering DO   On: 12/12/2019 16:39    [name], MD 12/15/2019, 7:36 AM PGY-2, [place] Intern pager: [phone], text pages welcome

## 2019-12-15 NOTE — Progress Notes (Signed)
FPTS Interim Progress Note  Called [facility].  Dr. Lynann Bologna is on vacation this week.  Left message with nurse to let him know on his return that patient is admitted for weakness and C and T spine have been imaged.  [place], DO 12/15/2019, 11:45 AM PGY-2, [place] Service pager [phone]

## 2019-12-16 DIAGNOSIS — E876 Hypokalemia: Secondary | ICD-10-CM | POA: Diagnosis not present

## 2019-12-16 DIAGNOSIS — E46 Unspecified protein-calorie malnutrition: Secondary | ICD-10-CM | POA: Diagnosis not present

## 2019-12-16 LAB — MAGNESIUM: Magnesium: 2.1 mg/dL (ref 1.7–2.4)

## 2019-12-16 LAB — CBC
HCT: 22.4 % — ABNORMAL LOW (ref 36.0–46.0)
Hemoglobin: 7.5 g/dL — ABNORMAL LOW (ref 12.0–15.0)
MCH: 30.4 pg (ref 26.0–34.0)
MCHC: 33.5 g/dL (ref 30.0–36.0)
MCV: 90.7 fL (ref 80.0–100.0)
Platelets: 159 10*3/uL (ref 150–400)
RBC: 2.47 MIL/uL — ABNORMAL LOW (ref 3.87–5.11)
RDW: 16.1 % — ABNORMAL HIGH (ref 11.5–15.5)
WBC: 4.6 10*3/uL (ref 4.0–10.5)
nRBC: 0 % (ref 0.0–0.2)

## 2019-12-16 LAB — PHOSPHORUS: Phosphorus: 2.4 mg/dL — ABNORMAL LOW (ref 2.5–4.6)

## 2019-12-16 LAB — BASIC METABOLIC PANEL
Anion gap: 5 (ref 5–15)
BUN: 12 mg/dL (ref 8–23)
CO2: 20 mmol/L — ABNORMAL LOW (ref 22–32)
Calcium: 7 mg/dL — ABNORMAL LOW (ref 8.9–10.3)
Chloride: 108 mmol/L (ref 98–111)
Creatinine, Ser: 0.64 mg/dL (ref 0.44–1.00)
GFR calc Af Amer: >60 mL/min (ref >60)
GFR calc non Af Amer: >60 mL/min (ref >60)
Glucose, Bld: 82 mg/dL (ref 70–99)
Potassium: 3.6 mmol/L (ref 3.5–5.1)
Sodium: 133 mmol/L — ABNORMAL LOW (ref 135–145)

## 2019-12-16 LAB — HCV AB W REFLEX TO QUANT PCR: HCV Ab: 0.5 s/co ratio (ref 0.0–0.9)

## 2019-12-16 LAB — HCV INTERPRETATION

## 2019-12-16 MED ORDER — CALCIUM CARBONATE ANTACID 500 MG PO CHEW
1.0000 | CHEWABLE_TABLET | Freq: Three times a day (TID) | ORAL | Status: DC | PRN
  Administered 2019-12-16 (×2): 200 mg via ORAL
  Filled 2019-12-16 (×2): qty 1

## 2019-12-16 MED ORDER — PRO-STAT SUGAR FREE PO LIQD
30.0000 mL | Freq: Three times a day (TID) | ORAL | Status: DC
  Administered 2019-12-16 – 2019-12-22 (×16): 30 mL via ORAL
  Filled 2019-12-16 (×16): qty 30

## 2019-12-16 MED ORDER — BOOST PLUS PO LIQD
237.0000 mL | Freq: Three times a day (TID) | ORAL | Status: DC
  Filled 2019-12-16 (×2): qty 237

## 2019-12-16 MED ORDER — PROCHLORPERAZINE MALEATE 10 MG PO TABS
10.0000 mg | ORAL_TABLET | Freq: Four times a day (QID) | ORAL | Status: DC | PRN
  Administered 2019-12-20 – 2019-12-31 (×5): 10 mg via ORAL
  Filled 2019-12-16 (×7): qty 1

## 2019-12-16 MED ORDER — POTASSIUM PHOSPHATES 15 MMOLE/5ML IV SOLN
30.0000 mmol | Freq: Once | INTRAVENOUS | Status: AC
  Administered 2019-12-16: 30 mmol via INTRAVENOUS
  Filled 2019-12-16: qty 10

## 2019-12-16 MED ORDER — PANTOPRAZOLE SODIUM 40 MG PO TBEC
40.0000 mg | DELAYED_RELEASE_TABLET | Freq: Every day | ORAL | Status: DC
  Administered 2019-12-18 – 2020-02-10 (×53): 40 mg via ORAL
  Filled 2019-12-16 (×53): qty 1

## 2019-12-16 MED ORDER — MIRTAZAPINE 15 MG PO TBDP
15.0000 mg | ORAL_TABLET | Freq: Every day | ORAL | Status: DC
  Administered 2019-12-16: 15 mg via ORAL
  Filled 2019-12-16: qty 1

## 2019-12-16 MED ORDER — PANTOPRAZOLE SODIUM 40 MG PO TBEC: 40.0000 mg | DELAYED_RELEASE_TABLET | Freq: Two times a day (BID) | ORAL | Status: DC

## 2019-12-16 NOTE — Progress Notes (Signed)
Family Medicine Teaching Service Daily Progress Note Intern Pager: [phone]  Patient name: Deanna Cunningham Medical record number: [id number] Date of birth: 28-May-1946 Age: 74 y.o. Gender: female  Primary Care Provider: Kristie Cowman, MD Consultants: None, consider reaching out to patient's orthopedist Code Status: Full code  Pt Overview and Major Events to Date:  3/26-patient admitted for weakness, malnutrition  Assessment and Plan: Deanna Cunningham is a 74 y.o. female presenting with weakness. PMH is significant for Rheumatoid Arthritis (no tx, no rheumatologist), chronic anemia and GERD.  Severe protein calorie malnutrition Pt has significant heartburn which is contributing to her not eating as much. She is not enjoying the [employer] which are causing a burning sensation in her esophagus. Yesterday reports not eating full amount lunch or dinner but ate most of her breakfast. Eaten 100% meals per flowsheet.  Patulous distal esophagus on abdominal CT 07/2018 consistent with EGD findings 2015.   RUQ [place] was unrevealing. Peripheral smear: Hypochromic anemia. Hypersegmented neutrophils. Hep C negative, celiac AB negative. -Consulting GI, Per Dr Collene Mares who has been seeing pt as outpatient and reports that she has had an extensive workup as OP. Per GI does not require further further IP investigations.   -Vitals per floor routine -Follow-up UA, protein/creatinine ratio -Normal diet as tolerated, supplement with Breeze shakes  -Nutrition eval, many thanks for recs. Will initiate calorie count again on 3/30 -PT today   GERD Symptoms as above -Continue Protinix 40mg    Electrolyte abnormalities suspect refeeding syndrome  K 3.4>3.6, Phosph 1.6>1.5>2.4 Mg 1.97>2.1 Calcium 7  -Replete phosphorous, KCl and Mg as necessary   Oral candidiasis White plaques underneath patient's tongue. KOH positive for yeast. QTC 515>505. S/p Fluconazole, switched to Nystatin on 3/29  -Continue Nystatin  QID  Anemia, acute on chronic Hb 7.1, 7.1 on 3/28. Peripheral smear: Hypochromic anemia. 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 -Monitor CBC  Sacral wound Stable -Keep area clean and covered, add padding to protect her skin -Recommend frequent position changes while lying in bed -Encourage out of bed  QT prolongation EKG showed SR, PVCs, RBBB and QTc 515>505 -Avoid QT prolonging medications -Repeat EKG today   Rheumatoid arthritis Ulnar deviation of MCP joints bilaterally. Does not have Rheumatologist and is not on DMARDs. -Monitor pain   -Ambulatory referral to Rheum on discharge   FEN/GI: Normal diet as tolerated Prophylaxis: Lovenox   Disposition: Likely home with home health  Subjective:  Pt has significant heartburn which is contributing to her not eating as much.  Objective: Temp:  [98.3 F (36.8 C)-99.1 F (37.3 C)] 98.3 F (36.8 C) (03/30 0300) Pulse Rate:  [91-108] 91 (03/30 0300) Resp:  [17-18] 17 (03/30 0300) BP: (96-104)/(56-74) 100/60 (03/30 0300) SpO2:  [65 %-100 %] 100 % (03/30 0300)   Physical Exam: General: Alert, pleasant,74 yr old AA female, cachetic  Cardio: Normal S1 and S2, RRR Pulm: CTAB, normal WOB  Abdomen: Bowel sounds normal. Abdomen soft and non-tender.  Extremities: No peripheral edema. Warm/ well perfused.  Strong radial pulse Neuro: Cranial nerves grossly intact  Laboratory: Recent Labs  Lab 12/13/19 1524 12/14/19 0640 12/15/19 0513  WBC 6.5 5.7 6.6  HGB 8.1* 7.1* 7.1*  HCT 23.8* 21.3* 21.3*  PLT 185 148* 169   Recent Labs  Lab 12/12/19 1317 12/12/19 1326 12/13/19 0224 12/13/19 0224 12/14/19 0640 12/15/19 0513 12/15/19 1643  NA   < >  --  136   < >  135 135 132*  K   < >  --  3.6   < > 3.6 3.4* 4.0  CL   < >  --  110   < > 112* 110 109  CO2   < >  --  18*   < > 20* 20* 20*  BUN   < >  --  20   < > 17 14 13   CREATININE   < >   --  0.79   < > 0.71 0.59 0.70  CALCIUM   < >  --  7.3*   < > 7.1* 7.1* 6.9*  PROT  --  5.4* 5.2*  --   --   --   --   BILITOT  --  0.3 0.4  --   --   --   --   ALKPHOS  --  86 90  --   --   --   --   ALT  --  26 24  --   --   --   --   AST  --  21 21  --   --   --   --   GLUCOSE   < >  --  100*   < > 73 93 129*   < > = values in this interval not displayed.    Imaging/Diagnostic Tests: MR LUMBAR SPINE:  WO CONTRAST  Result Date: 12/12/2019 CLINICAL DATA:  Patient reporting new numbness and weakness since 11/08/2019 MRI, L4 distribution numbness, poor rectal tone, poor strength; low back pain, cauda equina syndrome suspected. Additional history provided: Generalized weakness and hypokalemia, patient reports numbness from the waist down for the past month, multiple falls. EXAM: MRI LUMBAR SPINE WITHOUT CONTRAST TECHNIQUE: Multiplanar, multisequence MR imaging of the lumbar spine was performed. No intravenous contrast was administered. COMPARISON:  Lumbar spine MRI 11/10/2019. FINDINGS: Segmentation: For the purposes of this dictation, five lumbar vertebrae are assumed and the caudal most well-formed intervertebral disc is designated L5-S1. Alignment: Lumbar dextrocurvature. Unchanged 4 mm L4-L5 grade 1 anterolisthesis. Vertebrae: Vertebral body height is maintained. Redemonstrated diffusely heterogeneous marrow signal with a mottled appearance. Conus medullaris and cauda equina: Conus extends to the L1-L2 level. No signal abnormality within the visualized distal spinal cord. Paraspinal and other soft tissues: Nonspecific diffuse edema within the lumbar paraspinal soft tissues which may reflect a fluid overloaded state. Colonic diverticulosis. Disc levels: Unless otherwise stated, the level by level findings below have not significantly changed since prior MRI 11/10/2019. Moderate disc degeneration throughout the lumbar spine, greatest at L4-L5. T12-L1: Small central disc protrusion. No significant spinal  canal or foraminal stenosis. L1-L2: Disc bulge. Superimposed small right center disc protrusion. No significant spinal canal or foraminal stenosis. L2-L3: Disc bulge. Superimposed left center to left foraminal disc protrusion. Mild facet arthrosis. Unchanged left subarticular stenosis with slight crowding of the descending left L3 nerve root. Mild relative narrowing of the central canal. Moderate left neural foraminal narrowing. L3-L4: Disc bulge. Superimposed broad-based left center/foraminal disc protrusion. Mild facet arthrosis/ligamentum flavum hypertrophy. Left subarticular stenosis with crowding of the descending left L4 nerve root. Central canal patent. Moderate left neural foraminal narrowing. L4-L5: Grade 1 anterolisthesis. Disc uncovering and disc bulge. Prominent facet arthrosis with ligamentum flavum hypertrophy. Moderate/severe bilateral subarticular stenosis with crowding of the descending L5 nerve roots. Moderate central canal stenosis. Bilateral neural foraminal narrowing (mild right, moderate left). L5-S1: Disc bulge with endplate spurring. Superimposed moderate-sized central disc protrusion at site of posterior annular fissure. Mild  facet arthrosis. Bilateral subarticular stenosis with crowding of the descending S1 nerve roots (greater on the left) (series 8, image 29). Moderate central canal stenosis. No significant foraminal narrowing. IMPRESSION: Lumbar spondylosis has not significantly changed as compared MRI 11/10/2019. Findings are most notably as follows. At L4-L5, grade 1 anterolisthesis, disc uncovering and disc bulge, prominent facet arthrosis, ligamentum flavum hypertrophy. Moderate/severe bilateral subarticular stenosis with crowding of the descending L5 nerve roots. Moderate central canal stenosis. Bilateral neural foraminal narrowing (mild right, moderate left). At L5-S1, a broad-based central disc protrusion contributes to multifactorial bilateral subarticular narrowing with crowding  of the descending S1 nerve roots (greater on the left). Moderate central canal stenosis. Multifactorial left subarticular narrowing at L2-L3 and L3-L4 with crowding of the descending L3 and L4 nerve roots respectively. Redemonstrated diffuse heterogeneity of the osseous marrow signal. This finding has a broad range of differential causes, although in this patient may be secondary to chronic anemia. Electronically Signed   By: Kellie Simmering DO   On: 12/12/2019 16:39    Deanna Haw, MD 12/16/2019, 7:29 AM PGY-1, [place] Intern pager: [phone], text pages welcome

## 2019-12-16 NOTE — Progress Notes (Addendum)
Calorie Count Note  48 hour calorie count ordered.  Diet: regular Supplements: Boost Plus BID  Attempted to speak with pt in room, however, receiving nursing care.   RD spoke with pt's daughter, Deanna Cunningham, over the phone. She reports pt's intake has been minimal over the past while- commonly consumed foods include [employer] chicken noodle soup and toast, bacon, sausage, and crackers.   Per daughter, pt with poor tolerance of both Boost Breeze and Boost Plus. Boost Plus aggravates lactose intolerance and Boost Breeze aggravates acid reflux. Discussed other formulary options with pt daughter; amenable to try Prostat.   Pt daughter expressed concern over decreased calorie intake. RD discussed ways to increase calories and protein and diet. Also encouraged small frequent meals.   12/14/19-12/15/19 Breakfast: 636 kcals, 31 grams protein Lunch: 386 kcals, 23 grams protein Dinner: 393 kcals, 16 grams protein Supplements: 500 kcals, 18 grams protein  Total intake: 1915 kcal (> 100% of minimum estimated needs)  88 grams protein (100% of minimum estimated needs)  12/15/19-12/16/19 Breakfast: 469 kcals, 11 grams Lunch: 43 kcals, 3 grams protein Dinner: 329 kcals, 18 grams protein Supplements: 313 kcals, 11 grams protein  Total intake: 1160 kcal (73% of minimum estimated needs)  43 grams protein (51% of minimum estimated needs)  Average Total intake: 1538 kcal (96% of minimum estimated needs)  67 grams protein (79% of minimum estimated needs  Nutrition Dx: Inadequate oral intake related to decreased appetite as evidenced by per patient/family report.  Goal: Patient will meet greater than or equal to 90% of their needs  Intervention:   -D/c Boost Plus -Continue MVI with minerals daily -D/c calorie count -30 ml Prostat TID, each supplement provides 100 kcals and 15 grams protein -Provided "Tips of Increasing Calories and Protein" handout from [employer] [place]; attached to  AVS/ discharge instruction -RD also provided referral to [facility]  [name], RD, LDN, [place] Registered Dietitian II Certified Diabetes Care and Education Specialist Please refer to [facility] for RD and/or RD on-call/weekend/after hours pager

## 2019-12-16 NOTE — Progress Notes (Signed)
Provided update to patient's daughter. Explaining that she would likely be discharged tomorrow.  She did not feel that mother is ready for discharge and would like more inpatient investigations to why her mother has swallowing issues and unable to eat well. I explained that it is likely multifactorial but a large element is to do with a disordered eating pattern. I explained that pt has had numerous GI investigations and Dr Collene Mares commended that she does not require more inpatient GI investigations.  Declined mirtazapine for appetite stimulation.  [name] PGY-1, [place]

## 2019-12-16 NOTE — Plan of Care (Signed)

## 2019-12-16 NOTE — Progress Notes (Signed)
Physical Therapy Treatment Patient Details Name: Deanna Cunningham MRN: [id number] DOB: [birth year] Today's Date: 12/16/2019    History of Present Illness 74 y.o. female admitted on 12/12/19 for progressive weakness, poor po intake, bil LE swelling.  Pt dx with severe protein calorie malnutrition (concern for malignancy), medical workup in progress.  Pt also dx with anemia acute on chronic, hypokalemia, hypomagnesemia, stage 1 sacral wound, QT prolongation, and hypocalcemia.  Pt with significant PMH of dysphagia (s/p EGD with esophageal dialation), CKD III, anemia, RA.      PT Comments    Pt sitting in bed still working on breakfast (2hrs) on arrival. Pt willing to progress mobility and able to walk this session to bathroom and in room but not yet able to make it out of the room to practice stairs which she has to enter daughter's house. 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 Pt encouraged to continue to eat and mobilize OOB with staff getting to bathroom. Will continue to follow.     Follow Up Recommendations  SNF;Supervision/Assistance - 24 hour;Home health PT(pt and family declining SNF, maximize HHservices)     Equipment Recommendations  3in1 (PT)    Recommendations for Other Services       Precautions / Restrictions Precautions Precautions: Fall Precaution Comments: incontinent Restrictions Weight Bearing Restrictions: No    Mobility  Bed Mobility Overal bed mobility: Modified Independent Bed Mobility: Supine to Sit     Supine to sit: Min guard;HOB elevated     General bed mobility comments: HOb 40 degrees with increased time and pt able to pivot to EOB without physical assist  Transfers Overall transfer level: Needs assistance   Transfers: Sit to/from Stand Sit to Stand: Min guard         General transfer comment: cues for hand placement pt with tendency to reach for RW and does not reach back with sitting with transition  from bed, to/from toilet, and to chair  Ambulation/Gait Ambulation/Gait assistance: Min guard Gait Distance (Feet): 40 Feet Assistive device: Rolling walker (2 wheeled) Gait Pattern/deviations: Step-through pattern;Decreased stride length;Trunk flexed   Gait velocity interpretation: 1.31 - 2.62 ft/sec, indicative of limited community ambulator General Gait Details: cues for posture, proximity to RW and safety. Pt walked 15' then 46'   Stairs Stairs: (deferred due to fatigue)           Wheelchair Mobility    Modified Rankin (Stroke Patients Only)       Balance Overall balance assessment: Needs assistance Sitting-balance support: Feet supported Sitting balance-Leahy Scale: Fair     Standing balance support: Bilateral upper extremity supported Standing balance-Leahy Scale: Poor Standing balance comment: requires UE support                             Cognition Arousal/Alertness: Awake/alert Behavior During Therapy: Flat affect Overall Cognitive Status: Within Functional Limits for tasks assessed                                        Exercises General Exercises - Lower Extremity Long Arc Quad: AROM;Both;Seated;15 reps Hip ABduction/ADduction: AAROM;Both;Seated;10 reps Hip Flexion/Marching: AAROM;Both;Seated;10 reps    General Comments        Pertinent Vitals/Pain Faces Pain Scale: Hurts little more Pain Location: chest reporting reflux Pain Descriptors / Indicators: Aching Pain Intervention(s):  Limited activity within patient's tolerance;Monitored during session;Repositioned    Home Living                      Prior Function            PT Goals (current goals can now be found in the care plan section) Progress towards PT goals: Progressing toward goals    Frequency    Min 3X/week      PT Plan Current plan remains appropriate    Co-evaluation              AM-PAC PT "6 Clicks" Mobility   Outcome  Measure  Help needed turning from your back to your side while in a flat bed without using bedrails?: A Little Help needed moving from lying on your back to sitting on the side of a flat bed without using bedrails?: A Little Help needed moving to and from a bed to a chair (including a wheelchair)?: A Little Help needed standing up from a chair using your arms (e.g., wheelchair or bedside chair)?: A Little Help needed to walk in hospital room?: A Little Help needed climbing 3-5 steps with a railing? : A Lot 6 Click Score: 17    End of Session Equipment Utilized During Treatment: Gait belt Activity Tolerance: Patient limited by fatigue Patient left: in chair;with call bell/phone within reach;with chair alarm set Nurse Communication: Mobility status PT Visit Diagnosis: Muscle weakness (generalized) (M62.81);Difficulty in walking, not elsewhere classified (R26.2);Pain     Time: [id number] PT Time Calculation (min) (ACUTE ONLY): 25 min  Charges:  $Gait Training: 8-22 mins $Therapeutic Exercise: 8-22 mins                     Lyndsi Altic P, PT Acute Rehabilitation Services Pager: [phone] Office: [place] 12/16/2019, 12:13 PM

## 2019-12-16 NOTE — Discharge Instructions (Addendum)
Follow up with your Surgeon, Cardiology, and PCP on discharge.  You should consider seeing a nutritionist after discharge.  **  Suggestions For Increasing Calories And Protein  Several small meals a day are easier to eat and digest than three large ones. Space meals about 2 to 3 hours apart to maximize comfort.  Stop eating 2 to 3 hours before bed and sleep with your head elevated if gastric reflux (GERD) and heartburn are problems.  Do not eat your favorite foods if you are feeling bad. Save them for when you feel good!  Eat breakfast-type foods at any meal. Eggs are usually easy to eat and are great any time of the day. (The same goes for pancakes and waffles.)  Eat when you feel hungry. Most people have the greatest appetite in the morning because they have not eaten all night. If this is the best meal for you, then pile on those calories and other nutrients in the morning and at lunch. Then you can have a smaller dinner without losing total calories for the day.  Eat leftovers or nutritious snacks in the afternoon and early evening to round out your day.  Try homemade or commercially prepared nutrition bars and puddings, as well as calorie- and protein-rich liquid nutritional supplements. Benefits of Physical Activity Talk to your doctor about physical activity. Light or moderate physical activity can help maintain muscle and promote an appetite. Walking in the neighborhood or the local mall is a great way to get up, get out, and get moving. If you are unsteady on your feet, try walking around the dining room table. Save Room for [employer]! Drink most fluids between meals instead of with meals. (It is fine to have a sip to help swallow food at meal time.) Fluids (which usually have fewer calories and nutrients than solid food) can take up valuable space in your stomach.  Foods Recommended High-Protein Foods Milk products Add cheese to toast, crackers, sandwiches, baked potatoes,  vegetables, soups, noodles, meat, and fruit. Use reduced-fat (2%) or whole milk in place of water when cooking cereal and cream soups. Include cream sauces on vegetables and pasta. Add powdered milk to cream soups and mashed potatoes.  Eggs Have hard-cooked eggs readily available in the refrigerator. Chop and add to salads, casseroles, soups, and vegetables. Make a quick egg salad. All eggs should be well cooked to avoid the risk of harmful bacteria.  Meats, poultry, and fish Add leftover cooked meats to soups, casseroles, salads, and omelets. Make dip by mixing diced, chopped, or shredded meat with sour cream and spices.  Beans, legumes, nuts, and seeds Sprinkle nuts and seeds on cereals, fruit, and desserts such as ice cream, pudding, and custard. Also serve nuts and seeds on vegetables, salads, and pasta. Spread peanut butter on toast, bread, English muffins, and fruit, or blend it in a milk shake. Add beans and peas to salads, soups, casseroles, and vegetable dishes.  High-Calorie Foods Butter, margarine, and  oils Melt butter or margarine over potatoes, rice, pasta, and cooked vegetables. Add melted butter or margarine into soups and casseroles and spread on bread for sandwiches before spreading sandwich spread or peanut butter. Saut or stir-fry vegetables, meats, chicken and fish such as shrimp/scallops in olive or canola oil. A variety of oils add calories and can be used to [employer], chicken, or fish.  Milk products Add whipping cream to desserts, pancakes, waffles, fruit, and hot chocolate, and fold it into soups and casseroles. Add sour  cream to baked potatoes and vegetables.  Salad dressing Use regular (not low-fat or diet) mayonnaise and salad dressing on sandwiches and in dips with vegetables and fruit.   Sweets Add jelly and honey to bread and crackers. Add jam to fruit and ice cream and as a topping over cake.   Copyright 2020  [employer]y [employer]. All rights  reserved.

## 2019-12-16 NOTE — Progress Notes (Signed)
I was asked to call patient's brother Dr. Herminio Heads who wanted an update on patient's condition and also wanted to express his concerns.  He was concerned that patient is not ready for discharge, and he believes that her poor p.o. intake is linked to a esophageal issue which should be more thoroughly investigated as an inpatient.  I explained to him that patient has had extensive work-up with GI over the last 5 to 6 years and GI been consulted on this admission who recommended no further investigations as an inpatient.  I said I would reach out to Dr. Collene Mares and see what her recommendations would be.  He also asked what the physical therapist had recommended, I explained that they had recommended a skilled nursing facility but her daughter had declined this and would like to take Deanna Cunningham home with home health.  He believes that she should stay in hospital for inpatient rehab.  I explained that this is a discussion regarding her disposition that that he should have with his sister and niece.  Deanna Cunningham PGY-1, [place]

## 2019-12-16 NOTE — Progress Notes (Signed)
Occupational Therapy Treatment Patient Details Name: Deanna Cunningham MRN: [id number] DOB: 1945-11-05 Today's Date: 12/16/2019    History of present illness 74 y.o. female admitted on 12/12/19 for progressive weakness, poor po intake, bil LE swelling.  Pt dx with severe protein calorie malnutrition (concern for malignancy), medical workup in progress.  Pt also dx with anemia acute on chronic, hypokalemia, hypomagnesemia, stage 1 sacral wound, QT prolongation, and hypocalcemia.  Pt with significant PMH of dysphagia (s/p EGD with esophageal dialation), CKD III, anemia, RA.     OT comments  Pt incontinent of stool upon arrival. She required minA to ambulate into bathroom, modA for posterior care, maxA to don/doff socks, and minA to don/doff gown. Pt HR 115 to 130 with activity. Continued to educate pt on energy conservation strategies, taking frequent seated rest breaks. Pt will continue to benefit from skilled OT services to maximize safety and independence with ADL/IADL and functional mobility. Will continue to follow acutely and progress as tolerated.    Follow Up Recommendations  Supervision/Assistance - 24 hour;Home health OT    Equipment Recommendations  3 in 1 bedside commode;Tub/shower bench    Recommendations for Other Services      Precautions / Restrictions Precautions Precautions: Fall Precaution Comments: incontinent Restrictions Weight Bearing Restrictions: No       Mobility Bed Mobility Overal bed mobility: Modified Independent                Transfers Overall transfer level: Needs assistance   Transfers: Sit to/from Stand Sit to Stand: Min assist         General transfer comment: cues for hand placement, pt demonstrates prefernece to reach for RW     Balance Overall balance assessment: Needs assistance Sitting-balance support: Feet supported Sitting balance-Leahy Scale: Fair     Standing balance support: Bilateral upper extremity  supported Standing balance-Leahy Scale: Poor Standing balance comment: requires UE support                            ADL either performed or assessed with clinical judgement   ADL Overall ADL's : Needs assistance/impaired Eating/Feeding: Independent Eating/Feeding Details (indicate cue type and reason): pt eating lunch upon arrival, finished soup per ticket lunch was ready at 3:07 pt finished eating at 3:50                 Lower Body Dressing: Maximal assistance Lower Body Dressing Details (indicate cue type and reason): maxA to don/doff socks Toilet Transfer: Minimal assistance;RW;Ambulation   Toileting- Clothing Manipulation and Hygiene: Moderate assistance;Sit to/from stand Toileting - Clothing Manipulation Details (indicate cue type and reason): pt incontinent of stool required modA for posterior care and pt performed pericare     Functional mobility during ADLs: Minimal assistance;Rolling walker General ADL Comments: Pt fatigues quickly HR up to 130bpm with standing to complete pericare     Vision       Perception     Praxis      Cognition Arousal/Alertness: Awake/alert Behavior During Therapy: Flat affect Overall Cognitive Status: Within Functional Limits for tasks assessed                                          Exercises     Shoulder Instructions       General Comments HR 115 to 130 with activity  Pertinent Vitals/ Pain       Pain Assessment: No/denies pain Pain Intervention(s): Monitored during session  Home Living                                          Prior Functioning/Environment              Frequency  Min 2X/week        Progress Toward Goals  OT Goals(current goals can now be found in the care plan section)  Progress towards OT goals: Progressing toward goals  Acute Rehab OT Goals Patient Stated Goal: to be able to do more  OT Goal Formulation: With patient Time For Goal  Achievement: 12/28/19 Potential to Achieve Goals: Good ADL Goals Pt Will Perform Grooming: with min assist;standing Pt Will Perform Upper Body Bathing: with set-up;sitting Pt Will Perform Lower Body Bathing: with min assist;sit to/from stand Pt Will Perform Upper Body Dressing: with set-up;with supervision;sitting Pt Will Perform Lower Body Dressing: with min assist;sit to/from stand;with adaptive equipment Pt Will Transfer to Toilet: with mod assist;ambulating;regular height toilet;bedside commode;grab bars Pt Will Perform Toileting - Clothing Manipulation and hygiene: with min assist;sit to/from stand Pt/caregiver will Perform Home Exercise Program: Increased strength;Right Upper extremity;Left upper extremity;With Supervision;With written HEP provided  Plan Discharge plan remains appropriate    Co-evaluation                 AM-PAC OT "6 Clicks" Daily Activity     Outcome Measure   Help from another person eating meals?: None Help from another person taking care of personal grooming?: A Little Help from another person toileting, which includes using toliet, bedpan, or urinal?: A Lot Help from another person bathing (including washing, rinsing, drying)?: A Lot Help from another person to put on and taking off regular upper body clothing?: A Lot Help from another person to put on and taking off regular lower body clothing?: Total 6 Click Score: 14    End of Session Equipment Utilized During Treatment: Gait belt;Rolling walker  OT Visit Diagnosis: Unsteadiness on feet (R26.81);Repeated falls (R29.6);Muscle weakness (generalized) (M62.81);Pain Pain - part of body: (buttocks )   Activity Tolerance Patient limited by fatigue;Patient tolerated treatment well   Patient Left in chair;with call bell/phone within reach;with chair alarm set   Nurse Communication Mobility status        Time: [id number] OT Time Calculation (min): 26 min  Charges: OT General Charges $OT Visit:  1 Visit OT Treatments $Self Care/Home Management : 23-37 mins  Helene Kelp OTR/L Acute Rehabilitation Services Office: [place] 12/16/2019, 5:08 PM

## 2019-12-17 ENCOUNTER — Ambulatory Visit: Payer: BC Managed Care – PPO | Admitting: Cardiology

## 2019-12-17 ENCOUNTER — Inpatient Hospital Stay (HOSPITAL_COMMUNITY): Payer: BC Managed Care – PPO

## 2019-12-17 DIAGNOSIS — D509 Iron deficiency anemia, unspecified: Secondary | ICD-10-CM | POA: Diagnosis not present

## 2019-12-17 DIAGNOSIS — R1310 Dysphagia, unspecified: Secondary | ICD-10-CM

## 2019-12-17 DIAGNOSIS — E46 Unspecified protein-calorie malnutrition: Secondary | ICD-10-CM | POA: Diagnosis not present

## 2019-12-17 LAB — PROTEIN ELECTROPHORESIS, SERUM
A/G Ratio: 0.5 — ABNORMAL LOW (ref 0.7–1.7)
Albumin ELP: 1.6 g/dL — ABNORMAL LOW (ref 2.9–4.4)
Alpha-1-Globulin: 0.2 g/dL (ref 0.0–0.4)
Alpha-2-Globulin: 0.3 g/dL — ABNORMAL LOW (ref 0.4–1.0)
Beta Globulin: 0.8 g/dL (ref 0.7–1.3)
Gamma Globulin: 1.8 g/dL (ref 0.4–1.8)
Globulin, Total: 3.2 g/dL (ref 2.2–3.9)
Total Protein ELP: 4.8 g/dL — ABNORMAL LOW (ref 6.0–8.5)

## 2019-12-17 LAB — CBC
HCT: 20.6 % — ABNORMAL LOW (ref 36.0–46.0)
Hemoglobin: 6.8 g/dL — CL (ref 12.0–15.0)
MCH: 30.2 pg (ref 26.0–34.0)
MCHC: 33 g/dL (ref 30.0–36.0)
MCV: 91.6 fL (ref 80.0–100.0)
Platelets: 166 10*3/uL (ref 150–400)
RBC: 2.25 MIL/uL — ABNORMAL LOW (ref 3.87–5.11)
RDW: 16.3 % — ABNORMAL HIGH (ref 11.5–15.5)
WBC: 3.7 10*3/uL — ABNORMAL LOW (ref 4.0–10.5)
nRBC: 0 % (ref 0.0–0.2)

## 2019-12-17 LAB — BASIC METABOLIC PANEL
Anion gap: 5 (ref 5–15)
BUN: 16 mg/dL (ref 8–23)
CO2: 19 mmol/L — ABNORMAL LOW (ref 22–32)
Calcium: 7.1 mg/dL — ABNORMAL LOW (ref 8.9–10.3)
Chloride: 108 mmol/L (ref 98–111)
Creatinine, Ser: 0.55 mg/dL (ref 0.44–1.00)
GFR calc Af Amer: >60 mL/min (ref >60)
GFR calc non Af Amer: >60 mL/min (ref >60)
Glucose, Bld: 81 mg/dL (ref 70–99)
Potassium: 4.4 mmol/L (ref 3.5–5.1)
Sodium: 132 mmol/L — ABNORMAL LOW (ref 135–145)

## 2019-12-17 LAB — HEMOGLOBIN AND HEMATOCRIT, BLOOD
HCT: 28 % — ABNORMAL LOW (ref 36.0–46.0)
Hemoglobin: 9.6 g/dL — ABNORMAL LOW (ref 12.0–15.0)

## 2019-12-17 LAB — RETICULIN ANTIBODIES, IGA W TITER: Reticulin Ab, IgA: NEGATIVE titer (ref <2.5)

## 2019-12-17 LAB — PREPARE RBC (CROSSMATCH)

## 2019-12-17 LAB — HAPTOGLOBIN: Haptoglobin: <10 mg/dL — ABNORMAL LOW (ref 42–346)

## 2019-12-17 LAB — PHOSPHORUS: Phosphorus: 2.7 mg/dL (ref 2.5–4.6)

## 2019-12-17 LAB — MAGNESIUM: Magnesium: 1.8 mg/dL (ref 1.7–2.4)

## 2019-12-17 MED ORDER — MAGNESIUM SULFATE 2 GM/50ML IV SOLN
2.0000 g | Freq: Once | INTRAVENOUS | Status: AC
  Administered 2019-12-17: 2 g via INTRAVENOUS
  Filled 2019-12-17: qty 50

## 2019-12-17 MED ORDER — SODIUM PHOSPHATES 45 MMOLE/15ML IV SOLN
10.0000 mmol | Freq: Once | INTRAVENOUS | Status: AC
  Administered 2019-12-17: 10 mmol via INTRAVENOUS
  Filled 2019-12-17: qty 3.33

## 2019-12-17 MED ORDER — FAMOTIDINE 40 MG/5ML PO SUSR
20.0000 mg | Freq: Two times a day (BID) | ORAL | Status: DC
  Administered 2019-12-17: 20 mg via ORAL
  Filled 2019-12-17 (×2): qty 2.5

## 2019-12-17 MED ORDER — SODIUM CHLORIDE 0.9% IV SOLUTION: Freq: Once | INTRAVENOUS | Status: DC

## 2019-12-17 MED ORDER — SUCRALFATE 1 GM/10ML PO SUSP
1.0000 g | Freq: Three times a day (TID) | ORAL | Status: DC
  Administered 2019-12-17 – 2020-02-10 (×212): 1 g via ORAL
  Filled 2019-12-17 (×213): qty 10

## 2019-12-17 NOTE — Progress Notes (Signed)
Physical Therapy Treatment Patient Details Name: Deanna Cunningham MRN: [id number] DOB: [birth year] Today's Date: 12/17/2019    History of Present Illness 74 y.o. female admitted on 12/12/19 for progressive weakness, poor po intake, bil LE swelling.  Pt dx with severe protein calorie malnutrition (concern for malignancy), medical workup in progress.  Pt also dx with anemia acute on chronic, hypokalemia, hypomagnesemia, stage 1 sacral wound, QT prolongation, and hypocalcemia.  Pt with significant PMH of dysphagia (s/p EGD with esophageal dialation), CKD III, anemia, RA.      PT Comments    Pt tolerated bed level exercises and transfer to [facility]. Pt demonstrated fecal incontinence but was able to stand pivot transfer with RW and min A for power up into standing. Min Guard to pivot to [facility]. HR 102 lying in bed and 126 with activity. Pt session limited due to lethargy. Pt would continue to benefit from skilled physical therapy services at this time while admitted and after d/c to address the below listed limitations in order to improve overall safety and independence with functional mobility.   Follow Up Recommendations  SNF; Pt's family declines SNF, adamant d/c home w/ max HH services    Equipment Recommendations  3in1 (PT)    Recommendations for Other Services       Precautions / Restrictions Precautions Precautions: Fall Precaution Comments: incontinent Restrictions Weight Bearing Restrictions: No    Mobility  Bed Mobility Overal bed mobility: Needs Assistance Bed Mobility: Supine to Sit     Supine to sit: Min assist;HOB elevated     General bed mobility comments: HOB elevated, inc time, min A to get trunk upright. no assistance needed to scoot anteriorly  Transfers Overall transfer level: Needs assistance Equipment used: Rolling walker (2 wheeled) Transfers: Sit to/from [facility] Sit to Stand: Min assist Stand pivot transfers: Min guard       General  transfer comment: min A for power up into standing. min guard for pivot to [facility]. VC for safety and sequencing with RW  Ambulation/Gait                 Stairs             Wheelchair Mobility    Modified Rankin (Stroke Patients Only)       Balance Overall balance assessment: Needs assistance Sitting-balance support: Feet supported Sitting balance-Leahy Scale: Fair     Standing balance support: Bilateral upper extremity supported;During functional activity Standing balance-Leahy Scale: Fair Standing balance comment: requires UE support                             Cognition Arousal/Alertness: Lethargic Behavior During Therapy: Flat affect                                          Exercises General Exercises - Lower Extremity Ankle Circles/Pumps: AROM;Both;10 reps;Supine Long Arc Quad: AROM;Both;Seated;15 reps Heel Slides: AROM;Both;10 reps;Supine Hip ABduction/ADduction: AROM;Both;10 reps;Supine    General Comments General comments (skin integrity, edema, etc.): Pt demonstrated fecal incontinence but able to transfer to [facility]. HR 102 lying in bed, 117 sitting EOB, 126 with acitivity      Pertinent Vitals/Pain Pain Assessment: Faces Faces Pain Scale: Hurts a little bit Pain Location: general Pain Descriptors / Indicators: Discomfort Pain Intervention(s): Limited activity within patient's tolerance;Monitored during session;Repositioned  Home Living                      Prior Function            PT Goals (current goals can now be found in the care plan section) Acute Rehab PT Goals Patient Stated Goal: to feel better PT Goal Formulation: With patient Time For Goal Achievement: 12/27/19 Potential to Achieve Goals: Fair Progress towards PT goals: Progressing toward goals    Frequency    Min 3X/week      PT Plan Current plan remains appropriate    Co-evaluation              AM-PAC PT "6 Clicks"  Mobility   Outcome Measure  Help needed turning from your back to your side while in a flat bed without using bedrails?: A Little Help needed moving from lying on your back to sitting on the side of a flat bed without using bedrails?: A Little Help needed moving to and from a bed to a chair (including a wheelchair)?: A Lot Help needed standing up from a chair using your arms (e.g., wheelchair or bedside chair)?: A Little Help needed to walk in hospital room?: A Lot Help needed climbing 3-5 steps with a railing? : A Lot 6 Click Score: 15    End of Session Equipment Utilized During Treatment: Gait belt Activity Tolerance: Patient limited by fatigue Patient left: in bed;with call bell/phone within reach;with bed alarm set   PT Visit Diagnosis: Muscle weakness (generalized) (M62.81);Difficulty in walking, not elsewhere classified (R26.2);Pain     Time: [id number] PT Time Calculation (min) (ACUTE ONLY): 17 min  Charges:                       Dara Hoyer, SPT Acute Rehab  [id number]    Providencia Hottenstein 12/17/2019, 9:51 AM

## 2019-12-17 NOTE — Progress Notes (Signed)
PT Progress Note for Charges    12/17/19 1100  PT Visit Information  Last PT Received On 12/17/19  PT General Charges  $$ ACUTE PT VISIT 1 Visit  PT Treatments  $Therapeutic Activity 8-22 mins  Anastasio Champion, DPT  Acute Rehabilitation Services Pager [phone] Office [phone]

## 2019-12-17 NOTE — Plan of Care (Signed)
  Problem: Education: Goal: Knowledge of General Education information will improve Description: Including pain rating scale, medication(s)/side effects and non-pharmacologic comfort measures Outcome: Progressing   Problem: Activity: Goal: Risk for activity intolerance will decrease Outcome: Progressing   Problem: Coping: Goal: Level of anxiety will decrease Outcome: Progressing   

## 2019-12-17 NOTE — Consult Note (Addendum)
Reason for Consult: Severe malnutrition with poor oral intake Referring Physician: FPTS.  Deanna Cunningham is an 74 y.o. female.  HPI: Ms. Deanna Cunningham is a 74 year old black female with multiple medical problems listed below who was admitted to the [facility] emergency room on 12/12/2019 with generalized weakness and severe protein calorie malnutrition.  She has been having worsening lower extremity edema with unintentional weight loss and weight 93 pounds on admission.  She is also had some loose stools with fecal internal incontinence for the last month.  On admission she was found to be anemic with a hemoglobin of 8.5 potassium of 2.6 magnesium 1.6 and protein 5.4 with albumin of 1.4. 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  Even today she denies having any problems with difficulty swallowing but complains of an epigastric burning and discomfort that she has had after she eats.  She is requesting some chicken noodle soup for dinner tonight.  She has had a barium swallow earlier today that revealed a dilated esophagus with generalized dysmotility narrowing in the distal esophageal lumen. The last time she had this abnormality an EGD was done and the esophagus form was found to be widely patent and no dilatation was required is no stricture was noted in the distal esophagus.  However today the barium swallow did not pass into the stomach and it was thought that this may be related to the supine nature of the exam the lumen of the esophagus appeared somewhat larger than the diameter of the pill and there was no evidence of esophageal ulceration. Since I last saw the patient in the office she has become very cachectic and weak. She had a colonoscopy in 2019 that showed  extensive diverticular disease but the prep was very poor and the cecal base was not adequately visualized.  Patient has a hard time prepping for procedure and therefore this procedure was not repeated. Deanna Cunningham has been working full-time till about 4 to 5 weeks ago.  Her brother Dr. Brayton Caves, who is a vascular surgeon in [place] has been very actively involved in her care over the phone and making recommendations regarding her needs.  Patient clearly tells me today that she has had no problems swallowing the only GI issue she has is epigastric burning and pain and loose stools with fecal urgency and incontinence. Patient is requesting chicken noodle soup for dinner tonight along with crackers and she claims she has no difficulty swallowing the crackers as well.  Past Medical History:  Diagnosis Date  . Anemia in chronic disease   .  Rheumatoid arthritis    rheumatoid  . GERD (gastroesophageal reflux disease)/presbyesophagus   . Osteopenia 04/2018   T score -2.0 FRAX 4% / 0.9%  . Secondary hyperparathyroidism, renal [facility])    Past Surgical History:  Procedure Laterality Date  . arthritis    . ESOPHAGOGASTRODUODENOSCOPY N/A 02/20/2014   Procedure: ESOPHAGOGASTRODUODENOSCOPY (EGD);  Surgeon: [name], MD;  Location: [facility];  Service: Gastroenterology;  Laterality: N/A;  . ESOPHAGOGASTRODUODENOSCOPY (EGD) WITH ESOPHAGEAL DILATION     Family History  Problem Relation Age of Onset  . Congestive Heart Failure Mother   . Diabetes Mother   . Hypertension Mother   . Diabetes Sister   .  Hypertension Sister   . Cancer Brother        tumor removed from bladder  . Diabetes Brother   . Hypertension Brother    Social History:  reports that she has never smoked. She has never used smokeless tobacco. She reports that she does not drink alcohol or use drugs.  Allergies:  Allergies  Allergen Reactions  . Lactose Intolerance (Gi)    Medications: I have reviewed the patient's current  medications.  Results for orders placed or performed during the hospital encounter of 12/12/19 (from the past 48 hour(s))  Basic metabolic panel     Status: Abnormal   Collection Time: 12/15/19  4:43 PM  Result Value Ref Range   Sodium 132 (L) 135 - 145 mmol/L   Potassium 4.0 3.5 - 5.1 mmol/L   Chloride 109 98 - 111 mmol/L   CO2 20 (L) 22 - 32 mmol/L   Glucose, Bld 129 (H) 70 - 99 mg/dL    Comment: Glucose reference range applies only to samples taken after fasting for at least 8 hours.   BUN 13 8 - 23 mg/dL   Creatinine, Ser 0.70 0.44 - 1.00 mg/dL   Calcium 6.9 (L) 8.9 - 10.3 mg/dL   GFR calc non Af Amer >60 >60 mL/min   GFR calc Af Amer >60 >60 mL/min   Anion gap 3 (L) 5 - 15    Comment: Performed at [facility] [address], [place], [place] [address]  Magnesium     Status: None   Collection Time: 12/15/19  4:43 PM  Result Value Ref Range   Magnesium 1.8 1.7 - 2.4 mg/dL    Comment: Performed at [facility], [place] [address] [place], [place] [address]  Phosphorus     Status: None   Collection Time: 12/15/19  4:43 PM  Result Value Ref Range   Phosphorus 3.4 2.5 - 4.6 mg/dL    Comment: Performed at [facility], [place] [address] [place], [place] [id number]  Basic metabolic panel     Status: Abnormal   Collection Time: 12/16/19  6:20 AM  Result Value Ref Range   Sodium 133 (L) 135 - 145 mmol/L   Potassium 3.6 3.5 - 5.1 mmol/L   Chloride 108 98 - 111 mmol/L   CO2 20 (L) 22 - 32 mmol/L   Glucose, Bld 82 70 - 99 mg/dL    Comment: Glucose reference range applies only to samples taken after fasting for at least 8 hours.   BUN 12 8 - 23 mg/dL   Creatinine, Ser 0.64 0.44 - 1.00 mg/dL   Calcium 7.0 (L) 8.9 - 10.3 mg/dL   GFR calc non Af Amer >60 >60 mL/min   GFR calc Af Amer >60 >60 mL/min   Anion gap 5 5 - 15    Comment: Performed at [facility] [address] [place], [place] [address]  Phosphorus     Status: Abnormal   Collection Time: 12/16/19   6:20 AM  Result Value Ref Range   Phosphorus 2.4 (L) 2.5 - 4.6 mg/dL    Comment: Performed at [facility] [address], [place], [place] [address]  Magnesium     Status: None   Collection Time: 12/16/19  6:20 AM  Result Value Ref Range   Magnesium 2.1 1.7 - 2.4 mg/dL    Comment: Performed at [facility] [address] [place], [place] [address]  CBC  Status: Abnormal   Collection Time: 12/16/19  6:20 AM  Result Value Ref Range   WBC 4.6 4.0 - 10.5 K/uL   RBC 2.47 (L) 3.87 - 5.11 MIL/uL   Hemoglobin 7.5 (L) 12.0 - 15.0 g/dL   HCT 22.4 (L) 36.0 - 46.0 %   MCV 90.7 80.0 - 100.0 fL   MCH 30.4 26.0 - 34.0 pg   MCHC 33.5 30.0 - 36.0 g/dL   RDW 16.1 (H) 11.5 - 15.5 %   Platelets 159 150 - 400 K/uL   nRBC 0.0 0.0 - 0.2 %    Comment: Performed at [facility], [place] [address], [place], [place] [id number]  Basic metabolic panel     Status: Abnormal   Collection Time: 12/17/19  5:07 AM  Result Value Ref Range   Sodium 132 (L) 135 - 145 mmol/L   Potassium 4.4 3.5 - 5.1 mmol/L   Chloride 108 98 - 111 mmol/L   CO2 19 (L) 22 - 32 mmol/L   Glucose, Bld 81 70 - 99 mg/dL    Comment: Glucose reference range applies only to samples taken after fasting for at least 8 hours.   BUN 16 8 - 23 mg/dL   Creatinine, Ser 0.55 0.44 - 1.00 mg/dL   Calcium 7.1 (L) 8.9 - 10.3 mg/dL   GFR calc non Af Amer >60 >60 mL/min   GFR calc Af Amer >60 >60 mL/min   Anion gap 5 5 - 15    Comment: Performed at [facility] [address], [place], [place] [address]  Magnesium     Status: None   Collection Time: 12/17/19  5:07 AM  Result Value Ref Range   Magnesium 1.8 1.7 - 2.4 mg/dL    Comment: Performed at [facility] [address] [place], [place] [address]  Phosphorus     Status: None   Collection Time: 12/17/19  5:07 AM  Result Value Ref Range   Phosphorus 2.7 2.5 - 4.6 mg/dL    Comment: Performed at [facility] [address] [place] [address]   CBC     Status: Abnormal   Collection Time: 12/17/19  5:07 AM  Result Value Ref Range   WBC 3.7 (L) 4.0 - 10.5 K/uL   RBC 2.25 (L) 3.87 - 5.11 MIL/uL   Hemoglobin 6.8 (LL) 12.0 - 15.0 g/dL    Comment: REPEATED TO VERIFY THIS CRITICAL RESULT HAS VERIFIED AND BEEN CALLED TO Arminda Resides, RN BY JULIE MACEDA DEL ANGEL ON 03 31 2021 AT 0623, AND HAS BEEN READ BACK.     HCT 20.6 (L) 36.0 - 46.0 %   MCV 91.6 80.0 - 100.0 fL   MCH 30.2 26.0 - 34.0 pg   MCHC 33.0 30.0 - 36.0 g/dL   RDW 16.3 (H) 11.5 - 15.5 %   Platelets 166 150 - 400 K/uL   nRBC 0.0 0.0 - 0.2 %    Comment: Performed at [facility] [address], [place],  [address]  Type and screen [place]     Status: None (Preliminary result)   Collection Time: 12/17/19  7:53 AM  Result Value Ref Range   ABO/RH(D) O POS    Antibody Screen POS    Sample Expiration 12/20/2019,2359    Antibody Identification ANTI M    PT AG Type NEGATIVE FOR M ANTIGEN    Unit Number [id number]    Blood Component Type RBC  LR PHER2    Unit division 00    Status of Unit ISSUED    Donor AG Type NEGATIVE FOR M ANTIGEN    Transfusion Status OK TO TRANSFUSE    Crossmatch Result COMPATIBLE    Unit Number [id number]    Blood Component Type RBC LR PHER2    Unit division 00    Status of Unit ALLOCATED    Transfusion Status OK TO TRANSFUSE    Crossmatch Result COMPATIBLE    Donor AG Type NEGATIVE FOR M ANTIGEN   Prepare RBC (crossmatch)     Status: None   Collection Time: 12/17/19  7:53 AM  Result Value Ref Range   Order Confirmation      ORDER PROCESSED BY BLOOD BANK Performed at [facility], [place] [address], [place],  [address]     DG ESOPHAGUS W SINGLE CM (SOL OR THIN BA)  Result Date: 12/17/2019 CLINICAL DATA:  Failure to thrive. History of esophageal stricture with dilatation. EXAM: ESOPHOGRAM/BARIUM SWALLOW TECHNIQUE: Single contrast examination was performed using  thin barium. FLUOROSCOPY  TIME:  Fluoroscopy Time: 1 minutes and 18 seconds of low-dose pulsed fluoroscopy Radiation Exposure Index (if provided by the fluoroscopic device): 2.4 mGy Number of Acquired Spot Images: 0 COMPARISON:  Esophagram 03/10/2014 FINDINGS: The study is limited by the patient's limited mobility. Examination was performed in the supine and semi erect positions. Patient was not able to drink barium through a straw. Barium was administered per syringe into the patient's mouth during intermittent fluoroscopy. No laryngeal penetration or aspiration was observed. There is esophageal dysmotility with a decreased primary stripping wave and moderate dilatation of the distal esophagus, similar to previous study. There is smooth narrowing of the distal esophageal lumen. No evidence of ulceration or mass. A barium tablet was administered and passed into the mid esophagus. Despite raising the head of the table as much as could be safely achieved, the pill did not pass beyond the mid esophageal level. Comparing the diameter of the pill with the lumen of the distal esophagus, I would expect the pill to pass if the patient were erect. IMPRESSION: 1. Similar appearance to previous examination, with esophageal dysmotility, dilatation and persistent smooth narrowing of the distal esophageal lumen. 2. A barium tablet was not observed to pass into the stomach, although this may be related to the required supine nature of the examination. The lumen of the distal esophagus appears slightly larger than the diameter of the pill. No evidence of esophageal ulceration. Electronically Signed   By: Richardean Sale M.D.   On: 12/17/2019 13:52   Review of Systems  Constitutional: Positive for activity change, appetite change, fatigue and unexpected weight change. Negative for chills, diaphoresis and fever.  HENT: Negative.   Eyes: Negative.   Respiratory: Negative.   Cardiovascular: Positive for leg swelling. Negative for chest pain and  palpitations.  Gastrointestinal: Positive for constipation and diarrhea. Negative for abdominal distention, abdominal pain, anal bleeding, blood in stool, nausea, rectal pain and vomiting.  Endocrine: Negative.   Genitourinary: Negative.   Musculoskeletal: Positive for arthralgias, back pain, gait problem, joint swelling and myalgias. Negative for neck pain and neck stiffness.   Blood pressure (!) 90/48, pulse 91, temperature 98.1 F (36.7 C), resp. rate 18, height 5\' 4"  (1.626 m), weight 44 kg, SpO2 100 %. Physical Exam  Constitutional: She appears cachectic. She is active and cooperative. She appears toxic. No distress.  HENT:  Head: Normocephalic and atraumatic.  Eyes: Lids are normal.  Neck: Thyroid mass present. No thyromegaly present.  Cardiovascular: Normal rate and regular rhythm.  Respiratory: Effort normal.  GI: Soft. Normal appearance.  Neurological: She is alert.  Psychiatric: Her speech is normal and behavior is normal. Judgment and thought content normal. Her mood appears anxious. Cognition and memory are normal. She exhibits a depressed mood.   Assessment/Plan: Severe protein calorie malnutrition with presbyesophagus/abnormal barium swallow done today/history of a cervical web in the past-patient denies any dysphagia at this time.  As per my discussion with the nurse I will plan to start full liquids and see how she tolerates this.  If she has dysphagia with getting full liquids repeat EGD will be done. This has not been scheduled for tomorrow she just had a barium study today and with barium in the esophagus it would be difficult to visualize the mucosa. As per my discussion with her brother Dr. Brayton Caves, I do not feel she is a candidate for TPN or a PICC line as this is not the long-term answer for problem like hers. I have discussed her entire medical history with him in great detail including her severe spinal degeneration and her limited mobility due to her severe lower  extremity edema and her presbyesophagus.  I would discourage the use of calcium carbonate as this can sometimes worsen reflux and add famotidine to 40 mg twice daily along with pantoprazole that she is already receiving. She might also benefit from sucralfate suspension 1 g 4 times daily in between meals and at bedtime. 2) Diverticulosis/Chronic constipation/poor sphincter tone complicated by overflow incontinence. 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  4) Anemia of chronic disease-patient is receiving a blood transfusion today. 5) Severe protein calorie malnutrition with electrolyte abnormalities and abnormal weight loss-cross sectional imaging of the abdomen and pelvis with contrast may be helpful. 6) Oral candidiasis-on Magic mouth wash. 7) CODE STATUS needs to be discussed. I have informed the patient that due to her multiple medical issues and her severely malnourished baseline status there is not much intervention we can do from a GI standpoint.  She both she and her brother are very understanding of this and desire that she be kept comfortable if not much can be done for her. Juanita Craver 12/17/2019, 4:40 PM

## 2019-12-17 NOTE — Progress Notes (Signed)
Received a phone call from a Dr. Marijo Conception requesting to speak with the attending, Dr.Brown regarding this pt and possibility of blood transfusion. Dr.Brown paged with this information. Dr.Fogle's number is [phone]. Will continue to monitor.

## 2019-12-17 NOTE — Progress Notes (Signed)
Received page to call patient's brother-- Dr. Marijo Conception. Patient's brother expressed concern about patient's nutrition.  At time of first call, Dr. Marijo Conception requested we not administer blood as hemoglobin only mildly low, does not think she is acutely bleeding. Discussed teams recommendation with Dr. Marijo Conception. Agreed to discuss further with patient as she is having some symptoms.  Asked I could call back after rounding with team, he agreed.   Spoke with Dr. Marijo Conception again at 1125 AM. He reports he indeed would like his sister to receive blood. He is concerned about her nutrition and worries about complications from barium and EGD. He recommends PICC line with TPN and lipids or central line with same. Discussed- will discuss with dietary and GI. Would prefer to avoid PICC and TPN given complications. Patient took in >1500 calories last two days. He requests 'aggressive' care as she has lost significant weight since he last saw her.   Reviewed weights-- weight 2019 outpatient visit 44 kg, ED visit 2016 46 kg, weight today 44 kg.   Discussed with Dr. Marijo Conception, all questions answered. Asked that family appointment 1 person to communicate with for updates. Patient lives with daughter and has capacity to make her own decisions. Greatly appreciate Dr. Gordy Councilman input and insight into her care.  All questions answered, discussed that Dr. Erin Hearing takes over for me today.  Dorris Singh, MD  Family Medicine Teaching Service

## 2019-12-17 NOTE — Progress Notes (Signed)
Asked patient who she would like to be her primary decision-maker. She said that she would like her brother Dr. Mahala Menghini who lives in [place] to be her primary decision maker as he is a doctor and would have a better understanding of her condition. Her daughter who was also present during this conversation at her bedside agreed to this.  [name] PGY 1, [facility]

## 2019-12-17 NOTE — Progress Notes (Signed)
Family Medicine Teaching Service Daily Progress Note Intern Pager: [phone]  Patient name: Deanna Cunningham Medical record number: [id number] Date of birth: [birth year] Age: 74 y.o. Gender: female  Primary Care Provider: Kristie Cowman, MD Consultants: None, consider reaching out to patient's orthopedist Code Status: Full code  Pt Overview and Major Events to Date:  3/26-patient admitted for weakness, malnutrition  Assessment and Plan: Deanna Cunningham is a 74 y.o. female presenting with weakness. PMH is significant for Rheumatoid Arthritis (no tx, no rheumatologist), chronic anemia and GERD.  Severe protein calorie malnutrition Reports eating some of her meals but unable to tolerate a lot. 100% of meals eaten per flowsheet. Per nutrition recs: 1538 kcal (96% of minimum estimated needs), d/c calorie count, 46ml Prostat TID -Consulting GI, Per Dr Collene Mares who recommended repeat barium swallow and she will formally consult patient today -Follow-up UA, protein/creatinine ratio -Normal diet as tolerated, supplement with Prostat shakes   -Pt/OT  GERD On going GERD symptoms improving with TUMs -Continue Protinix 40mg  -TUMS prn   Electrolyte abnormalities suspect refeeding syndrome  K 3.4>3.6>4.4, Phosph 1.6>1.5>2.4>2.7. Repleted today with 38mmol sodium phosphate Mg 1.97>2.1>1.8, Repleted today with 2g Mg Calcium 7>7.1 -Replete phosphorous, KCl and Mg as necessary   Oral candidiasis White plaques underneath patient's tongue. KOH positive for yeast. QTC 515>505. Repeat EKG on 3/30 showed QTc 509. S/p Fluconazole, switched to Nystatin on 3/29  -Continue Nystatin QID  Anemia, acute on chronic Hb 7.1>6.8. Peripheral smear: Hypochromic anemia. 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 -Transfusion 1 uRBC, patient provided consent this morning  -H&H -Monitor CBC  Sacral wound Stable -Keep area clean  and covered, add padding to protect her skin -Recommend frequent position changes while lying in bed -Encourage out of bed  QT prolongation EKG showed SR, PVCs, RBBB and QTc 515>505>509 -Avoid QT prolonging medications   Rheumatoid arthritis Ulnar deviation of MCP joints bilaterally. Does not have Rheumatologist and is not on DMARDs. -Monitor pain   -Ambulatory referral to Rheum on discharge   FEN/GI: Normal diet as tolerated Prophylaxis: Lovenox   Disposition: Likely home with home health/SNF pending family's decision   Subjective:  Pt would like brother to be sole decision maker, not daughter. No other concerns.   Objective: Temp:  [97.7 F (36.5 C)-98.7 F (37.1 C)] 98.3 F (36.8 C) (03/31 0744) Pulse Rate:  [97-98] 98 (03/31 0744) Resp:  [16-18] 18 (03/31 0744) BP: (92-100)/(41-56) 100/56 (03/31 0744) SpO2:  [98 %-100 %] 100 % (03/31 0744) Weight:  [44 kg] 44 kg (03/31 0500)   Physical Exam: General: Alert, cachetic appearing 74 yr old female,  Cardio: Normal S1 and S2, RRR Pulm: CTAB, normal WOB  Abdomen: Bowel sounds normal. Abdomen soft and non-tender.  Extremities: Mild 1+ pitting edema. Warm/ well perfused.   Neuro: Cranial nerves grossly intact  Laboratory: Recent Labs  Lab 12/15/19 0513 12/16/19 0620 12/17/19 0507  WBC 6.6 4.6 3.7*  HGB 7.1* 7.5* 6.8*  HCT 21.3* 22.4* 20.6*  PLT 169 159 166   Recent Labs  Lab 12/12/19 1317 12/12/19 1326 12/13/19 0224 12/14/19 0640 12/15/19 1643 12/16/19 0620 12/17/19 0507  NA   < >  --  136   < > 132* 133* 132*  K   < >  --  3.6   < > 4.0 3.6 4.4  CL   < >  --  110   < > 109 108 108  CO2   < >  --  18*   < > 20* 20* 19*  BUN   < >  --  20   < > 13 12 16   CREATININE   < >  --  0.79   < > 0.70 0.64 0.55  CALCIUM   < >  --  7.3*   < > 6.9* 7.0* 7.1*  PROT  --  5.4* 5.2*  --   --   --   --   BILITOT  --  0.3 0.4  --   --   --   --   ALKPHOS  --  86 90  --   --   --   --   ALT  --  26 24  --   --   --    --   AST  --  21 21  --   --   --   --   GLUCOSE   < >  --  100*   < > 129* 82 81   < > = values in this interval not displayed.    Imaging/Diagnostic Tests: MR LUMBAR SPINE:  WO CONTRAST  Result Date: 12/12/2019 CLINICAL DATA:  Patient reporting new numbness and weakness since 11/08/2019 MRI, L4 distribution numbness, poor rectal tone, poor strength; low back pain, cauda equina syndrome suspected. Additional history provided: Generalized weakness and hypokalemia, patient reports numbness from the waist down for the past month, multiple falls. EXAM: MRI LUMBAR SPINE WITHOUT CONTRAST TECHNIQUE: Multiplanar, multisequence MR imaging of the lumbar spine was performed. No intravenous contrast was administered. COMPARISON:  Lumbar spine MRI 11/10/2019. FINDINGS: Segmentation: For the purposes of this dictation, five lumbar vertebrae are assumed and the caudal most well-formed intervertebral disc is designated L5-S1. Alignment: Lumbar dextrocurvature. Unchanged 4 mm L4-L5 grade 1 anterolisthesis. Vertebrae: Vertebral body height is maintained. Redemonstrated diffusely heterogeneous marrow signal with a mottled appearance. Conus medullaris and cauda equina: Conus extends to the L1-L2 level. No signal abnormality within the visualized distal spinal cord. Paraspinal and other soft tissues: Nonspecific diffuse edema within the lumbar paraspinal soft tissues which may reflect a fluid overloaded state. Colonic diverticulosis. Disc levels: Unless otherwise stated, the level by level findings below have not significantly changed since prior MRI 11/10/2019. Moderate disc degeneration throughout the lumbar spine, greatest at L4-L5. T12-L1: Small central disc protrusion. No significant spinal canal or foraminal stenosis. L1-L2: Disc bulge. Superimposed small right center disc protrusion. No significant spinal canal or foraminal stenosis. L2-L3: Disc bulge. Superimposed left center to left foraminal disc protrusion. Mild  facet arthrosis. Unchanged left subarticular stenosis with slight crowding of the descending left L3 nerve root. Mild relative narrowing of the central canal. Moderate left neural foraminal narrowing. L3-L4: Disc bulge. Superimposed broad-based left center/foraminal disc protrusion. Mild facet arthrosis/ligamentum flavum hypertrophy. Left subarticular stenosis with crowding of the descending left L4 nerve root. Central canal patent. Moderate left neural foraminal narrowing. L4-L5: Grade 1 anterolisthesis. Disc uncovering and disc bulge. Prominent facet arthrosis with ligamentum flavum hypertrophy. Moderate/severe bilateral subarticular stenosis with crowding of the descending L5 nerve roots. Moderate central canal stenosis. Bilateral neural foraminal narrowing (mild right, moderate left). L5-S1: Disc bulge with endplate spurring. Superimposed moderate-sized central disc protrusion at site of posterior annular fissure. Mild facet arthrosis. Bilateral subarticular stenosis with crowding of the descending S1 nerve roots (greater on the left) (series 8, image 29). Moderate central canal stenosis. No significant foraminal narrowing. IMPRESSION: Lumbar spondylosis has not significantly changed as compared MRI 11/10/2019. Findings are most notably as follows.  At L4-L5, grade 1 anterolisthesis, disc uncovering and disc bulge, prominent facet arthrosis, ligamentum flavum hypertrophy. Moderate/severe bilateral subarticular stenosis with crowding of the descending L5 nerve roots. Moderate central canal stenosis. Bilateral neural foraminal narrowing (mild right, moderate left). At L5-S1, a broad-based central disc protrusion contributes to multifactorial bilateral subarticular narrowing with crowding of the descending S1 nerve roots (greater on the left). Moderate central canal stenosis. Multifactorial left subarticular narrowing at L2-L3 and L3-L4 with crowding of the descending L3 and L4 nerve roots respectively.  Redemonstrated diffuse heterogeneity of the osseous marrow signal. This finding has a broad range of differential causes, although in this patient may be secondary to chronic anemia. Electronically Signed   By: Kellie Simmering DO   On: 12/12/2019 16:39    [name], MD 12/17/2019, 1:52 PM PGY-1, [place] Intern pager: [phone], text pages welcome

## 2019-12-17 NOTE — Progress Notes (Signed)
Lab notified nurse of Hemoglobin of 6.8   Family Medicine Intern notified.

## 2019-12-17 NOTE — Progress Notes (Signed)
SLP Cancellation Note  Patient Details Name: Deanna Cunningham MRN: [id number] DOB: [birth year]   Cancelled evaluation:   Clarification received from Dr. Posey Pronto - No MBS needed, pt will be undergoing esophagram.  SLP service will sign off.  Rajendra Spiller L. Tivis Ringer, [place] CCC/SLP Acute Rehabilitation Services Office number [phone] Pager [phone]         Juan Quam Laurice 12/17/2019, 10:32 AM

## 2019-12-18 ENCOUNTER — Inpatient Hospital Stay: Payer: Self-pay

## 2019-12-18 ENCOUNTER — Inpatient Hospital Stay (HOSPITAL_COMMUNITY): Payer: BC Managed Care – PPO

## 2019-12-18 DIAGNOSIS — D649 Anemia, unspecified: Secondary | ICD-10-CM

## 2019-12-18 DIAGNOSIS — R531 Weakness: Secondary | ICD-10-CM

## 2019-12-18 DIAGNOSIS — E43 Unspecified severe protein-calorie malnutrition: Secondary | ICD-10-CM | POA: Diagnosis not present

## 2019-12-18 DIAGNOSIS — M069 Rheumatoid arthritis, unspecified: Secondary | ICD-10-CM

## 2019-12-18 DIAGNOSIS — R229 Localized swelling, mass and lump, unspecified: Secondary | ICD-10-CM

## 2019-12-18 DIAGNOSIS — M8580 Other specified disorders of bone density and structure, unspecified site: Secondary | ICD-10-CM

## 2019-12-18 DIAGNOSIS — N183 Chronic kidney disease, stage 3 unspecified: Secondary | ICD-10-CM

## 2019-12-18 LAB — BASIC METABOLIC PANEL
Anion gap: 5 (ref 5–15)
BUN: 16 mg/dL (ref 8–23)
CO2: 19 mmol/L — ABNORMAL LOW (ref 22–32)
Calcium: 6.9 mg/dL — ABNORMAL LOW (ref 8.9–10.3)
Chloride: 108 mmol/L (ref 98–111)
Creatinine, Ser: 0.5 mg/dL (ref 0.44–1.00)
GFR calc Af Amer: >60 mL/min (ref >60)
GFR calc non Af Amer: >60 mL/min (ref >60)
Glucose, Bld: 84 mg/dL (ref 70–99)
Potassium: 4.1 mmol/L (ref 3.5–5.1)
Sodium: 132 mmol/L — ABNORMAL LOW (ref 135–145)

## 2019-12-18 LAB — URINALYSIS, ROUTINE W REFLEX MICROSCOPIC
Bilirubin Urine: NEGATIVE
Glucose, UA: NEGATIVE mg/dL
Hgb urine dipstick: NEGATIVE
Ketones, ur: NEGATIVE mg/dL
Leukocytes,Ua: NEGATIVE
Nitrite: NEGATIVE
Protein, ur: NEGATIVE mg/dL
Specific Gravity, Urine: 1.015 (ref 1.005–1.030)
pH: 6 (ref 5.0–8.0)

## 2019-12-18 LAB — MAGNESIUM: Magnesium: 2.1 mg/dL (ref 1.7–2.4)

## 2019-12-18 LAB — CBC
HCT: 26.4 % — ABNORMAL LOW (ref 36.0–46.0)
Hemoglobin: 8.8 g/dL — ABNORMAL LOW (ref 12.0–15.0)
MCH: 30.3 pg (ref 26.0–34.0)
MCHC: 33.3 g/dL (ref 30.0–36.0)
MCV: 91 fL (ref 80.0–100.0)
Platelets: 162 10*3/uL (ref 150–400)
RBC: 2.9 MIL/uL — ABNORMAL LOW (ref 3.87–5.11)
RDW: 15.6 % — ABNORMAL HIGH (ref 11.5–15.5)
WBC: 4 10*3/uL (ref 4.0–10.5)
nRBC: 0 % (ref 0.0–0.2)

## 2019-12-18 LAB — IMMUNOFIXATION ELECTROPHORESIS
IgA: 578 mg/dL — ABNORMAL HIGH (ref 64–422)
IgG (Immunoglobin G), Serum: 2045 mg/dL — ABNORMAL HIGH (ref 586–1602)
IgM (Immunoglobulin M), Srm: 21 mg/dL — ABNORMAL LOW (ref 26–217)
Total Protein ELP: 4.6 g/dL — ABNORMAL LOW (ref 6.0–8.5)

## 2019-12-18 LAB — RETICULOCYTES
Immature Retic Fract: 6.5 % (ref 2.3–15.9)
RBC.: 2.84 MIL/uL — ABNORMAL LOW (ref 3.87–5.11)
Retic Count, Absolute: 36.3 10*3/uL (ref 19.0–186.0)
Retic Ct Pct: 1.3 % (ref 0.4–3.1)

## 2019-12-18 LAB — PROTEIN / CREATININE RATIO, URINE
Creatinine, Urine: 36.18 mg/dL
Protein Creatinine Ratio: 0.72 mg/mg{Cre} — ABNORMAL HIGH (ref 0.00–0.15)
Total Protein, Urine: 26 mg/dL

## 2019-12-18 LAB — PHOSPHORUS: Phosphorus: 2.8 mg/dL (ref 2.5–4.6)

## 2019-12-18 MED ORDER — CHLORHEXIDINE GLUCONATE CLOTH 2 % EX PADS
6.0000 | MEDICATED_PAD | Freq: Every day | CUTANEOUS | Status: DC
  Administered 2019-12-19 – 2020-02-10 (×52): 6 via TOPICAL

## 2019-12-18 MED ORDER — SODIUM CHLORIDE 0.9% FLUSH
10.0000 mL | Freq: Two times a day (BID) | INTRAVENOUS | Status: DC
  Administered 2019-12-19 – 2020-02-09 (×56): 10 mL

## 2019-12-18 MED ORDER — FAMOTIDINE 20 MG PO TABS
20.0000 mg | ORAL_TABLET | Freq: Two times a day (BID) | ORAL | Status: DC
  Administered 2019-12-18 – 2020-02-10 (×107): 20 mg via ORAL
  Filled 2019-12-18 (×106): qty 1

## 2019-12-18 MED ORDER — IOHEXOL 300 MG/ML  SOLN
100.0000 mL | Freq: Once | INTRAMUSCULAR | Status: AC | PRN
  Administered 2019-12-18: 100 mL via INTRAVENOUS

## 2019-12-18 MED ORDER — SODIUM CHLORIDE 0.9% FLUSH
10.0000 mL | INTRAVENOUS | Status: DC | PRN
  Administered 2019-12-20 – 2019-12-24 (×2): 10 mL
  Administered 2020-01-08 – 2020-01-13 (×2): 20 mL
  Administered 2020-01-14 – 2020-02-06 (×12): 10 mL

## 2019-12-18 NOTE — Plan of Care (Signed)

## 2019-12-18 NOTE — Progress Notes (Signed)
FPTS Interim Progress Note  Pharmacy called and noted they received the TPN order placed by GI.  However they are unable to complete this order tonight as these orders need to be in by noon to be completed that day.  TPN consult will not occur until tomorrow.  She also recommended that patient should have a PICC line placed as her peripheral IV would be very difficult to use.  Discuss further with medical team.  Danna Hefty, DO 12/18/2019, 6:19 PM PGY-2, [place] Medicine Service pager [phone]

## 2019-12-18 NOTE — Progress Notes (Signed)
Peripherally Inserted Central Catheter Placement  The IV Nurse has discussed with the patient and/or persons authorized to consent for the patient, the purpose of this procedure and the potential benefits and risks involved with this procedure.  The benefits include less needle sticks, lab draws from the catheter, and the patient may be discharged home with the catheter. Risks include, but not limited to, infection, bleeding, blood clot (thrombus formation), and puncture of an artery; nerve damage and irregular heartbeat and possibility to perform a PICC exchange if needed/ordered by physician.  Alternatives to this procedure were also discussed.  Bard Power PICC patient education guide, fact sheet on infection prevention and patient information card has been provided to patient /or left at bedside.    PICC Placement Documentation  PICC Double Lumen 12/18/19 PICC Right Brachial 35 cm 0 cm (Active)  Indication for Insertion or Continuance of Line Administration of hyperosmolar/irritating solutions (i.e. TPN, Vancomycin, etc.) 12/18/19 2254  Exposed Catheter (cm) 0 cm 12/18/19 2254  Site Assessment Clean;Dry;Intact 12/18/19 2254  Lumen #1 Status Flushed;Saline locked;Blood return noted 12/18/19 2254  Lumen #2 Status Flushed;Saline locked;Blood return noted 12/18/19 2254  Dressing Type Transparent 12/18/19 2254  Dressing Status Clean;Dry;Intact;Antimicrobial disc in place 12/18/19 2254  Dressing Change Due 12/25/19 12/18/19 2254       Gordan Payment 12/18/2019, 10:56 PM

## 2019-12-18 NOTE — Progress Notes (Signed)
TPN Consult  Consult for TPN received. Per hospital policy, TPNs are hung at 1800 and orders need to be received by noon. [place] team to consider placing a central line if want to start TPN tomorrow. TPN labs and dietician consult ordered per protocol. Pharmacy will assess for TPN start on 12/19/19.   Benetta Spar, PharmD, BCPS, BCCP Clinical Pharmacist  Please check AMION for all [facility] phone numbers After 10:00 PM, call [place] [phone]

## 2019-12-18 NOTE — Progress Notes (Addendum)
Subjective: Periumbilical pain.  Objective: Vital signs in last 24 hours: Temp:  [97.4 F (36.3 C)-98.4 F (36.9 C)] 98.1 F (36.7 C) (04/01 0746) Pulse Rate:  [63-90] 86 (04/01 0746) Resp:  [15-18] 15 (04/01 0746) BP: (95-102)/(51-58) 99/51 (04/01 0746) SpO2:  [96 %-100 %] 100 % (04/01 0746) Last BM Date: 12/17/19  Intake/Output from previous day: 03/31 0701 - 04/01 0700 In: 711 [Blood:408; IV Piggyback:303] Out: -  Intake/Output this shift: Total I/O In: 240 [P.O.:240] Out: -   General appearance: alert, no distress and cachectic GI: soft, non-tender; bowel sounds normal; no masses,  no organomegaly  Lab Results: Recent Labs    12/16/19 0620 12/16/19 0620 12/17/19 0507 12/17/19 2021 12/18/19 0155  WBC 4.6  --  3.7*  --  4.0  HGB 7.5*   < > 6.8* 9.6* 8.8*  HCT 22.4*   < > 20.6* 28.0* 26.4*  PLT 159  --  166  --  162   < > = values in this interval not displayed.   BMET Recent Labs    12/16/19 0620 12/17/19 0507 12/18/19 0155  NA 133* 132* 132*  K 3.6 4.4 4.1  CL 108 108 108  CO2 20* 19* 19*  GLUCOSE 82 81 84  BUN 12 16 16   CREATININE 0.64 0.55 0.50  CALCIUM 7.0* 7.1* 6.9*   LFT No results for input(s): PROT, ALBUMIN, AST, ALT, ALKPHOS, BILITOT, BILIDIR, IBILI in the last 72 hours. PT/INR No results for input(s): LABPROT, INR in the last 72 hours. Hepatitis Panel No results for input(s): HEPBSAG, HCVAB, HEPAIGM, HEPBIGM in the last 72 hours. C-Diff No results for input(s): CDIFFTOX in the last 72 hours. Fecal Lactopherrin No results for input(s): FECLLACTOFRN in the last 72 hours.  Studies/Results: DG ESOPHAGUS W SINGLE CM (SOL OR THIN BA)  Result Date: 12/17/2019 CLINICAL DATA:  Failure to thrive. History of esophageal stricture with dilatation. EXAM: ESOPHOGRAM/BARIUM SWALLOW TECHNIQUE: Single contrast examination was performed using  thin barium. FLUOROSCOPY TIME:  Fluoroscopy Time: 1 minutes and 18 seconds of low-dose pulsed fluoroscopy  Radiation Exposure Index (if provided by the fluoroscopic device): 2.4 mGy Number of Acquired Spot Images: 0 COMPARISON:  Esophagram 03/10/2014 FINDINGS: The study is limited by the patient's limited mobility. Examination was performed in the supine and semi erect positions. Patient was not able to drink barium through a straw. Barium was administered per syringe into the patient's mouth during intermittent fluoroscopy. No laryngeal penetration or aspiration was observed. There is esophageal dysmotility with a decreased primary stripping wave and moderate dilatation of the distal esophagus, similar to previous study. There is smooth narrowing of the distal esophageal lumen. No evidence of ulceration or mass. A barium tablet was administered and passed into the mid esophagus. Despite raising the head of the table as much as could be safely achieved, the pill did not pass beyond the mid esophageal level. Comparing the diameter of the pill with the lumen of the distal esophagus, I would expect the pill to pass if the patient were erect. IMPRESSION: 1. Similar appearance to previous examination, with esophageal dysmotility, dilatation and persistent smooth narrowing of the distal esophageal lumen. 2. A barium tablet was not observed to pass into the stomach, although this may be related to the required supine nature of the examination. The lumen of the distal esophagus appears slightly larger than the diameter of the pill. No evidence of esophageal ulceration. Electronically Signed   By: Richardean Sale M.D.   On: 12/17/2019 13:52  Medications:  Scheduled: . sodium chloride   Intravenous Once  . famotidine  20 mg Oral BID  . feeding supplement (PRO-STAT SUGAR FREE 64)  30 mL Oral TID BM  . mouth rinse  15 mL Mouth Rinse BID  . multivitamin with minerals  1 tablet Oral Daily  . nystatin  5 mL Oral QID  . pantoprazole  40 mg Oral Daily  . sucralfate  1 g Oral TID WC & HS    Continuous:   Assessment/Plan: 1) Periumbilical pain. 2) Bloating. 3) Severe malnutrition. 4) RA.   The patient complains about having periumbilical pain and she needs to eructate to help alleviate her symptoms.  She denies any problems with nausea, vomiting, or overt dysphagia.  Her appetite, she feels, is reasonable, but she has a significant amount of weight loss.  I reviewed Dr. Lorie Apley consult note, but I will have to discuss further with her about TPN.  I feel that the patient should be started on TPN, but Dr. Collene Mares knows the patient much better.  It is not entirely clear that this weight loss is a chronic issue as she states an acceleration of the weight loss starting in mid December.  There was a CT scan in 2019 performed for generalized abdominal pain and a 5 lbs weight loss that was normal.  With the abdominal discomfort and weight loss a repeat CT scan is required.  Plan: 1) CT of the ABM/Pelvis with IV/PO contrast. 2) I will discuss with Dr. Collene Mares about her concerns with TPN, but she cannot orally meet or exceed her caloric requirements, currently.      ADDENDUM: I spoke with Dr. Collene Mares.  The plan is to start on TPN.  LOS: 6 days   Kellan Boehlke D 12/18/2019, 2:57 PM

## 2019-12-18 NOTE — Progress Notes (Addendum)
Family Medicine Teaching Service Daily Progress Note Intern Pager: [phone]  Patient name: Deanna Cunningham Medical record number: [id number] Date of birth: September 23, 1945 Age: 74 y.o. Gender: female  Primary Care Provider: Kristie Cowman, MD Consultants: None, consider reaching out to patient's orthopedist Code Status: Full code  Pt Overview and Major Events to Date:  3/26-patient admitted for weakness, malnutrition  Assessment and Plan: Deanna Cunningham is a 74 y.o. female presenting with weakness. PMH is significant for Rheumatoid Arthritis (no tx, no rheumatologist), chronic anemia and GERD.  Severe protein calorie malnutrition Protein/Cr ratio 0.72. Barium swallow esophageal dysmotility, dilatation and persistent smooth narrowing of the distal esophageal lumen. UA wnl. Tolerating FLD 100% -Per nutrition recs: 1538 kcal (96% of minimum estimated needs -Consulting GI, Per Deanna Cunningham who recommended: FLD if not tolerating repeat EGD -Prostat shakes  TID -PT/OT -Consider CT abd/plevis  Hypotension BP 99/51. No dizziness, shortness of breath.  -Orthostatics -Continue to monitor symptoms  GERD Endorse abdominal discomfort/fullness -Famotidine 40mg  BID -Continue Protinix 40mg  -Sucralfate 1g TID, qhs -TUMS prn   Electrolyte abnormalities suspect refeeding syndrome  K 4.4>4.1, Phosph 2.7>2.8. Mg 1.8>2.1, Na 132. Ca corrected to 8.8 -Replete as necessary -Consider IV hydration if Na does not improve with oral  Oral candidiasis White plaques underneath patient's tongue. KOH positive for yeast. QTC 515>505. Repeat EKG on 3/30 showed QTc 509. S/p Fluconazole, switched to Nystatin on 3/29  -Continue Nystatin QID  Anemia, acute on chronic s/p 1 U pRBCs Hb 7.1>6.8>s/p 1U 8.8. Peripheral smear: Hypochromic anemia. 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 -Monitor CBC daily   Sacral  wound Stable -Keep area clean and covered, add padding to protect her skin -Recommend frequent position changes while lying in bed -Encourage out of bed  QT prolongation EKG showed SR, PVCs, RBBB and QTc 515>505>509 -Avoid QT prolonging medications   Rheumatoid arthritis Ulnar deviation of MCP joints bilaterally. Does not have Rheumatologist and is not on DMARDs. -Monitor pain   -Ambulatory referral to Rheum on discharge   FEN/GI: FLD Prophylaxis: SCDs  Disposition: Likely home with home health/SNF pending family's decision   Subjective:  Enjoying FLD. Has some abdominal discomfort.   Objective: Temp:  [97.4 F (36.3 C)-98.4 F (36.9 C)] 98.1 F (36.7 C) (04/01 0746) Pulse Rate:  [63-93] 86 (04/01 0746) Resp:  [15-18] 15 (04/01 0746) BP: (90-102)/(44-58) 99/51 (04/01 0746) SpO2:  [96 %-100 %] 100 % (04/01 0746)   Physical Exam:  General: Appears well, no acute distress. Age appropriate. Cardiac: RRR, normal heart sounds albeit low sounding, no murmurs Respiratory: CTAB, normal effort Abdomen: soft, nontender, nondistended, +BS Extremities: No edema or cyanosis.   Laboratory: Recent Labs  Lab 12/16/19 0620 12/16/19 0620 12/17/19 0507 12/17/19 2021 12/18/19 0155  WBC 4.6  --  3.7*  --  4.0  HGB 7.5*   < > 6.8* 9.6* 8.8*  HCT 22.4*   < > 20.6* 28.0* 26.4*  PLT 159  --  166  --  162   < > = values in this interval not displayed.   Recent Labs  Lab 12/12/19 1317 12/12/19 1326 12/13/19 0224 12/14/19 0640 12/16/19 0620 12/17/19 0507 12/18/19 0155  NA   < >  --  136   < > 133* 132* 132*  K   < >  --  3.6   < > 3.6 4.4 4.1  CL   < >  --  110   < >  108 108 108  CO2   < >  --  18*   < > 20* 19* 19*  BUN   < >  --  20   < > 12 16 16   CREATININE   < >  --  0.79   < > 0.64 0.55 0.50  CALCIUM   < >  --  7.3*   < > 7.0* 7.1* 6.9*  PROT  --  5.4* 5.2*  --   --   --   --   BILITOT  --  0.3 0.4  --   --   --   --   ALKPHOS  --  86 90  --   --   --   --   ALT   --  26 24  --   --   --   --   AST  --  21 21  --   --   --   --   GLUCOSE   < >  --  100*   < > 82 81 84   < > = values in this interval not displayed.    Imaging/Diagnostic Tests: ESOPHOGRAM/BARIUM SWALLOW COMPARISON:  Esophagram 03/10/2014 IMPRESSION: 1. Similar appearance to previous examination, with esophageal dysmotility, dilatation and persistent smooth narrowing of the distal esophageal lumen. 2. A barium tablet was not observed to pass into the stomach, although this may be related to the required supine nature of the examination. The lumen of the distal esophagus appears slightly larger than the diameter of the pill. No evidence of esophageal ulceration.   Deanna Fee, DO 12/18/2019, 8:11 AM PGY-1, [place] Intern pager: [phone], text pages welcome

## 2019-12-18 NOTE — Consult Note (Signed)
New Hematology/Oncology Consult   Requesting MD: Talbert Cage  Reason for consult: Anemia    HPI: Deanna Cunningham has a history of chronic anemia.  She presented to the emergency room on 12/13/2019 with weakness and "swelling ".  She was noted to have anemia on admission with a hemoglobin of 8.5, platelets 159,000, white count 5.7.  A repeat CBC on 12/13/2019 found to hemoglobin 8.1 with an absolute neutrophil count of 5.8.  A serum protein electrophoresis revealed no serum M spike.  The ferritin returned elevated at 964, and the vitamin B12 at 1407.  Deanna Cunningham has a history of chronic anemia.  She states the anemia has been present for many years.  She was evaluated by Dr. Alen Blew in 2014.  She was felt to possibly have myelodysplasia.  She declined a bone marrow biopsy and did not return for hematology follow-up.      Past Medical History:  Diagnosis Date  . Anemia   . Anemia in chronic renal disease   . Arthritis    rheumatoid  . CKD (chronic kidney disease), stage III   . Dysphagia   . GERD (gastroesophageal reflux disease)   . Osteopenia 04/2018   T score -2.0 FRAX 4% / 0.9%  . Renal disorder   . Secondary hyperparathyroidism, renal ([place])   :  Past Surgical History:  Procedure Laterality Date  . arthritis    . ESOPHAGOGASTRODUODENOSCOPY N/A 02/20/2014   Procedure: ESOPHAGOGASTRODUODENOSCOPY (EGD);  Surgeon: [name], MD;  Location: [facility];  Service: Gastroenterology;  Laterality: N/A;  . ESOPHAGOGASTRODUODENOSCOPY (EGD) WITH ESOPHAGEAL DILATION    :   Current Facility-Administered Medications:  .  0.9 %  sodium chloride infusion (Manually program via Guardrails IV Fluids), , Intravenous, Once, [name], MD, Stopped at 12/17/19 0931 .  acetaminophen (TYLENOL) tablet 650 mg, 650 mg, Oral, Q6H PRN, 650 mg at 12/18/19 0447 **OR** acetaminophen (TYLENOL) suppository 650 mg, 650 mg, Rectal, Q6H PRN, Milus Banister C, DO .  famotidine (PEPCID) tablet 20 mg, 20  mg, Oral, BID, Hammons, Kimberly B, RPH, 20 mg at 12/18/19 1001 .  feeding supplement (PRO-STAT SUGAR FREE 64) liquid 30 mL, 30 mL, Oral, TID BM, Martyn Malay, MD, 30 mL at 12/18/19 1426 .  magic mouthwash, 10 mL, Oral, TID PRN, Milus Banister C, DO, 10 mL at 12/15/19 1235 .  MEDLINE mouth rinse, 15 mL, Mouth Rinse, BID, Martyn Malay, MD, 15 mL at 12/18/19 1002 .  multivitamin with minerals tablet 1 tablet, 1 tablet, Oral, Daily, Martyn Malay, MD, 1 tablet at 12/18/19 1001 .  nystatin (MYCOSTATIN) 100000 UNIT/ML suspension 500,000 Units, 5 mL, Oral, QID, Meccariello, Bernita Raisin, DO, 500,000 Units at 12/18/19 1426 .  pantoprazole (PROTONIX) EC tablet 40 mg, 40 mg, Oral, Daily, [name], MD, 40 mg at 12/18/19 1002 .  prochlorperazine (COMPAZINE) tablet 10 mg, 10 mg, Oral, Q6H PRN, Posey Pronto, Poonam, MD .  sucralfate (CARAFATE) 1 GM/10ML suspension 1 g, 1 g, Oral, TID WC & HS, Collene Mares, Jyothi, MD, 1 g at 12/18/19 1254:  . sodium chloride   Intravenous Once  . famotidine  20 mg Oral BID  . feeding supplement (PRO-STAT SUGAR FREE 64)  30 mL Oral TID BM  . mouth rinse  15 mL Mouth Rinse BID  . multivitamin with minerals  1 tablet Oral Daily  . nystatin  5 mL Oral QID  . pantoprazole  40 mg Oral Daily  . sucralfate  1 g Oral TID WC &  HS  :  Allergies  Allergen Reactions  . Lactose Intolerance (Gi)   :  FH: She reports anemia in multiple female family members.  SOCIAL HISTORY: She does not smoke or use alcohol.  Review of Systems:  Positives include: Lower extremity swelling, generalized weakness, joint deformities of the hands, dysphagia  A complete ROS was otherwise negative.   Physical Exam:  Blood pressure (!) 99/51, pulse 86, temperature 98.1 F (36.7 C), temperature source Oral, resp. rate 15, height 5' 4"  (1.626 m), weight 97 lb (44 kg), SpO2 100 %.  HEENT: No thrush Lungs: Clear bilaterally Cardiac: Regular rate and rhythm Abdomen: No hepatosplenomegaly  Vascular:  Anasarca Lymph nodes: No cervical, supraclavicular, axillary, or inguinal nodes Neurologic: Alert and oriented, the motor exam appears intact in the upper and lower extremities bilaterally Skin: No rash Musculoskeletal: No spine tenderness, joint deformities at the MCP joints bilaterally  LABS:  Recent Labs    12/17/19 0507 12/17/19 0507 12/17/19 2021 12/18/19 0155  WBC 3.7*  --   --  4.0  HGB 6.8*   < > 9.6* 8.8*  HCT 20.6*   < > 28.0* 26.4*  PLT 166  --   --  162   < > = values in this interval not displayed.  Blood smear from 12/13/2019: The platelets are normal in number, the majority of the white cells are mature neutrophils, few 5 lobe neutrophils, no blasts or other young forms present.  There are a moderate number of ovalocytes, acanthocytes, and burr cells.  Few teardrops and helmet cells.  Recent Labs    12/17/19 0507 12/18/19 0155  NA 132* 132*  K 4.4 4.1  CL 108 108  CO2 19* 19*  GLUCOSE 81 84  BUN 16 16  CREATININE 0.55 0.50  CALCIUM 7.1* 6.9*   Hemoglobin electrophoresis 01/25/2010: Hemoglobin A 97.2, hemoglobin A2 2.8   RADIOLOGY:  DG Chest 2 View  Result Date: 12/14/2019 CLINICAL DATA:  Patient with progressive weakness. EXAM: CHEST - 2 VIEW COMPARISON:  Chest radiograph 05/17/2013 FINDINGS: Monitoring leads overlie the patient. Stable cardiac and mediastinal contours. Minimal heterogeneous opacities left lung base. No pleural effusion or pneumothorax. Thoracic spine degenerative changes. IMPRESSION: Minimal heterogeneous opacities left lung base favored to represent atelectasis. Electronically Signed   By: Lovey Newcomer M.D.   On: 12/14/2019 16:53   MR CERVICAL SPINE WO CONTRAST  Result Date: 12/13/2019 CLINICAL DATA:  Limb weakness. EXAM: MRI CERVICAL AND THORACIC SPINE WITHOUT CONTRAST TECHNIQUE: Multiplanar and multiecho pulse sequences of the cervical spine, to include the craniocervical junction and cervicothoracic junction, and the thoracic spine, were  obtained without intravenous contrast. COMPARISON:  None. FINDINGS: MRI CERVICAL SPINE FINDINGS Alignment: Trace retrolisthesis of C4 on C5. Vertebrae: Heterogeneous bone marrow signal diffusely including diminished T1 signal intensity, nonspecific but may be related to the patient's chronic anemia. No fracture or destructive osseous lesion identified. Multilevel chronic degenerative endplate changes. Cord: Normal signal and morphology. 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 Preserved vertebral artery flow voids. Disc levels: Disc space narrowing from C3-4 to C6-7, most severe at C3-4. C2-3: Negative. C3-4: Disc bulging and mild uncovertebral spurring result in mild right neural foraminal stenosis without significant spinal stenosis. C4-5: Disc bulging and uncovertebral spurring result in mild spinal stenosis and moderate right neural foraminal stenosis. C5-6: Broad left paracentral disc osteophyte complex and uncovertebral spurring result in mild spinal stenosis and moderate left neural foraminal stenosis. C6-7: Disc bulging and left uncovertebral  spurring result in mild left neural foraminal stenosis without significant spinal stenosis. C7-T1: Negative. MRI THORACIC SPINE FINDINGS Alignment:  Normal. Vertebrae: Diffuse bone marrow heterogeneity as described above. No fracture or destructive osseous lesion identified. Cord:  Normal signal and morphology. Paraspinal and other soft tissues: Patulous thoracic esophagus containing small volume fluid. Disc levels: Mild thoracic spondylosis with small central disc protrusions at T8-9, T9-10, and T12-L1. No spinal cord mass effect or stenosis. IMPRESSION: 1. Multilevel cervical disc degeneration with with mild spinal stenosis at C4-5 and C5-6. No cord compression. 2. Moderate neural foraminal stenosis on the right at C4-5 and on the left at C5-6. 3. Mild thoracic spondylosis without stenosis. Electronically Signed   By:  Logan Bores M.D.   On: 12/13/2019 18:54   MR THORACIC SPINE WO CONTRAST  Result Date: 12/13/2019 CLINICAL DATA:  Limb weakness. EXAM: MRI CERVICAL AND THORACIC SPINE WITHOUT CONTRAST TECHNIQUE: Multiplanar and multiecho pulse sequences of the cervical spine, to include the craniocervical junction and cervicothoracic junction, and the thoracic spine, were obtained without intravenous contrast. COMPARISON:  None. FINDINGS: MRI CERVICAL SPINE FINDINGS Alignment: Trace retrolisthesis of C4 on C5. Vertebrae: Heterogeneous bone marrow signal diffusely including diminished T1 signal intensity, nonspecific but may be related to the patient's chronic anemia. No fracture or destructive osseous lesion identified. Multilevel chronic degenerative endplate changes. Cord: Normal signal and morphology. 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 Preserved vertebral artery flow voids. Disc levels: Disc space narrowing from C3-4 to C6-7, most severe at C3-4. C2-3: Negative. C3-4: Disc bulging and mild uncovertebral spurring result in mild right neural foraminal stenosis without significant spinal stenosis. C4-5: Disc bulging and uncovertebral spurring result in mild spinal stenosis and moderate right neural foraminal stenosis. C5-6: Broad left paracentral disc osteophyte complex and uncovertebral spurring result in mild spinal stenosis and moderate left neural foraminal stenosis. C6-7: Disc bulging and left uncovertebral spurring result in mild left neural foraminal stenosis without significant spinal stenosis. C7-T1: Negative. MRI THORACIC SPINE FINDINGS Alignment:  Normal. Vertebrae: Diffuse bone marrow heterogeneity as described above. No fracture or destructive osseous lesion identified. Cord:  Normal signal and morphology. Paraspinal and other soft tissues: Patulous thoracic esophagus containing small volume fluid. Disc levels: Mild thoracic spondylosis with small central disc  protrusions at T8-9, T9-10, and T12-L1. No spinal cord mass effect or stenosis. IMPRESSION: 1. Multilevel cervical disc degeneration with with mild spinal stenosis at C4-5 and C5-6. No cord compression. 2. Moderate neural foraminal stenosis on the right at C4-5 and on the left at C5-6. 3. Mild thoracic spondylosis without stenosis. Electronically Signed   By: Logan Bores M.D.   On: 12/13/2019 18:54   MR LUMBAR SPINE WO CONTRAST  Result Date: 12/12/2019 CLINICAL DATA:  Patient reporting new numbness and weakness since 11/08/2019 MRI, L4 distribution numbness, poor rectal tone, poor strength; low back pain, cauda equina syndrome suspected. Additional history provided: Generalized weakness and hypokalemia, patient reports numbness from the waist down for the past month, multiple falls. EXAM: MRI LUMBAR SPINE WITHOUT CONTRAST TECHNIQUE: Multiplanar, multisequence MR imaging of the lumbar spine was performed. No intravenous contrast was administered. COMPARISON:  Lumbar spine MRI 11/10/2019. FINDINGS: Segmentation: For the purposes of this dictation, five lumbar vertebrae are assumed and the caudal most well-formed intervertebral disc is designated L5-S1. Alignment: Lumbar dextrocurvature. Unchanged 4 mm L4-L5 grade 1 anterolisthesis. Vertebrae: Vertebral body height is maintained. Redemonstrated diffusely heterogeneous marrow signal with a mottled appearance. Conus medullaris and cauda equina: Conus  extends to the L1-L2 level. No signal abnormality within the visualized distal spinal cord. Paraspinal and other soft tissues: Nonspecific diffuse edema within the lumbar paraspinal soft tissues which may reflect a fluid overloaded state. Colonic diverticulosis. Disc levels: Unless otherwise stated, the level by level findings below have not significantly changed since prior MRI 11/10/2019. Moderate disc degeneration throughout the lumbar spine, greatest at L4-L5. T12-L1: Small central disc protrusion. No significant  spinal canal or foraminal stenosis. L1-L2: Disc bulge. Superimposed small right center disc protrusion. No significant spinal canal or foraminal stenosis. L2-L3: Disc bulge. Superimposed left center to left foraminal disc protrusion. Mild facet arthrosis. Unchanged left subarticular stenosis with slight crowding of the descending left L3 nerve root. Mild relative narrowing of the central canal. Moderate left neural foraminal narrowing. L3-L4: Disc bulge. Superimposed broad-based left center/foraminal disc protrusion. Mild facet arthrosis/ligamentum flavum hypertrophy. Left subarticular stenosis with crowding of the descending left L4 nerve root. Central canal patent. Moderate left neural foraminal narrowing. L4-L5: Grade 1 anterolisthesis. Disc uncovering and disc bulge. Prominent facet arthrosis with ligamentum flavum hypertrophy. Moderate/severe bilateral subarticular stenosis with crowding of the descending L5 nerve roots. Moderate central canal stenosis. Bilateral neural foraminal narrowing (mild right, moderate left). L5-S1: Disc bulge with endplate spurring. Superimposed moderate-sized central disc protrusion at site of posterior annular fissure. Mild facet arthrosis. Bilateral subarticular stenosis with crowding of the descending S1 nerve roots (greater on the left) (series 8, image 29). Moderate central canal stenosis. No significant foraminal narrowing. IMPRESSION: Lumbar spondylosis has not significantly changed as compared MRI 11/10/2019. Findings are most notably as follows. At L4-L5, grade 1 anterolisthesis, disc uncovering and disc bulge, prominent facet arthrosis, ligamentum flavum hypertrophy. Moderate/severe bilateral subarticular stenosis with crowding of the descending L5 nerve roots. Moderate central canal stenosis. Bilateral neural foraminal narrowing (mild right, moderate left). At L5-S1, a broad-based central disc protrusion contributes to multifactorial bilateral subarticular narrowing with  crowding of the descending S1 nerve roots (greater on the left). Moderate central canal stenosis. Multifactorial left subarticular narrowing at L2-L3 and L3-L4 with crowding of the descending L3 and L4 nerve roots respectively. Redemonstrated diffuse heterogeneity of the osseous marrow signal. This finding has a broad range of differential causes, although in this patient may be secondary to chronic anemia. Electronically Signed   By: Kellie Simmering DO   On: 12/12/2019 16:39   MYOCARDIAL PERFUSION IMAGING  Result Date: 12/03/2019  The left ventricular ejection fraction is hyperdynamic (>65%).  Nuclear stress EF: 67%.  There was no ST segment deviation noted during stress.  No T wave inversion was noted during stress.  Impression: 1. This is an un-interpretable myocardial perfusion imaging study due to extracardiac activity. Per review of the raw images, there was profound gut uptake artifact. 2. Would recommend to re-scan the patient or to pursue a CCTA if able. [place] T. Audie Box, [place] [address], [place] [address] [place], [place] [address] [phone] 2:57 PM  ECHOCARDIOGRAM COMPLETE  Result Date: 11/21/2019    ECHOCARDIOGRAM REPORT   Patient Name:   URVI IMES Date of Exam: 11/21/2019 Medical Rec #:  [id number]         Height:       64.5 in Accession #:    [id number]        Weight:       97.0 lb Date of Birth:  01-Dec-1945         BSA:          1.446  m Patient Age:    74 years          BP:           126/82 mmHg Patient Gender: F                 HR:           89 bpm. Exam Location:  [address] Procedure: 2D Echo, Color Doppler and Cardiac Doppler Indications:    I45.2 Right Bundle Branch Block  History:        Patient has no prior history of Echocardiogram examinations.                 Arrythmias:LBBB and RBBB, Signs/Symptoms:Syncope; Risk                 Factors:Family History of Coronary Artery Disease. Balance                 Disruptions, Fatigue, ESRD.  Sonographer:     Deliah Boston RDCS Referring Phys: [place]  1. Left ventricular ejection fraction, by estimation, is 65 to 70%. The left ventricle has normal function. The left ventricle has no regional wall motion abnormalities. Left ventricular diastolic parameters are indeterminate.  2. Right ventricular systolic function is normal. The right ventricular size is normal. There is mildly elevated pulmonary artery systolic pressure.  3. MR is eccentric an directed posterior into L atrium . Moderate mitral valve regurgitation.  4. Tricuspid valve regurgitation is moderate.  5. The aortic valve is tricuspid. Aortic valve regurgitation is not visualized. Mild aortic valve sclerosis is present, with no evidence of aortic valve stenosis.  6. The inferior vena cava is normal in size with greater than 50% respiratory variability, suggesting right atrial pressure of 3 mmHg. FINDINGS  Left Ventricle: Left ventricular ejection fraction, by estimation, is 65 to 70%. The left ventricle has normal function. The left ventricle has no regional wall motion abnormalities. The left ventricular internal cavity size was normal in size. There is  no left ventricular hypertrophy. Left ventricular diastolic parameters are indeterminate. Right Ventricle: The right ventricular size is normal. No increase in right ventricular wall thickness. Right ventricular systolic function is normal. There is mildly elevated pulmonary artery systolic pressure. The tricuspid regurgitant velocity is 2.90  m/s, and with an assumed right atrial pressure of 3 mmHg, the estimated right ventricular systolic pressure is 19.5 mmHg. Left Atrium: Left atrial size was normal in size. Right Atrium: Right atrial size was normal in size. Pericardium: There is no evidence of pericardial effusion. Mitral Valve: MR is eccentric an directed posterior into L atrium. The mitral valve is normal in structure and function. Moderate mitral valve regurgitation. Tricuspid  Valve: The tricuspid valve is grossly normal. Tricuspid valve regurgitation is moderate. Aortic Valve: The aortic valve is tricuspid. Aortic valve regurgitation is not visualized. Mild aortic valve sclerosis is present, with no evidence of aortic valve stenosis. Pulmonic Valve: The pulmonic valve was not well visualized. Pulmonic valve regurgitation is not visualized. Aorta: The aortic root is normal in size and structure. Venous: The inferior vena cava is normal in size with greater than 50% respiratory variability, suggesting right atrial pressure of 3 mmHg. IAS/Shunts: No atrial level shunt detected by color flow Doppler.  LEFT VENTRICLE PLAX 2D LVIDd:         3.88 cm  Diastology LVIDs:         2.33 cm  LV e' lateral:   11.00 cm/s LV PW:  0.89 cm  LV E/e' lateral: 6.7 LV IVS:        0.65 cm  LV e' medial:    6.09 cm/s LVOT diam:     2.20 cm  LV E/e' medial:  12.1 LV SV:         65 LV SV Index:   45 LVOT Area:     3.80 cm  RIGHT VENTRICLE RV S prime:     13.80 cm/s TAPSE (M-mode): 2.7 cm LEFT ATRIUM             Index       RIGHT ATRIUM           Index LA diam:        3.00 cm 2.07 cm/m  RA Area:     16.30 cm LA Vol (A2C):   48.5 ml 33.54 ml/m RA Volume:   44.10 ml  30.50 ml/m LA Vol (A4C):   54.1 ml 37.41 ml/m LA Biplane Vol: 52.6 ml 36.37 ml/m  AORTIC VALVE LVOT Vmax:   67.20 cm/s LVOT Vmean:  41.700 cm/s LVOT VTI:    0.171 m  AORTA Ao Root diam: 3.30 cm Ao Asc diam:  2.90 cm MITRAL VALVE                TRICUSPID VALVE MV Area (PHT): cm          TR Peak grad:   33.6 mmHg MV Decel Time: 232 msec     TR Vmax:        290.00 cm/s MR Peak grad:   118.8 mmHg MR Mean grad:   75.0 mmHg   SHUNTS MR Vmax:        545.00 cm/s Systemic VTI:  0.17 m MR Vmean:       405.5 cm/s  Systemic Diam: 2.20 cm MR PISA:        3.08 cm MR PISA Radius: 0.70 cm MV E velocity: 73.70 cm/s MV A velocity: 70.80 cm/s MV E/A ratio:  1.04 Dorris Carnes MD Electronically signed by Dorris Carnes MD Signature Date/Time: 11/21/2019/1:08:39 PM     Final    US Abdomen Limited RUQ  Result Date: 12/13/2019 CLINICAL DATA:  Weight loss EXAM: ULTRASOUND ABDOMEN LIMITED RIGHT UPPER QUADRANT COMPARISON:  None. FINDINGS: Gallbladder: No gallstones are noted. The gallbladder is decompressed with gallbladder wall measuring 5.3 mm. This may be secondary to decompression and surrounding ascites. No sonographic Murphy sign noted by sonographer. Common bile duct: Diameter: 2.5 mm Liver: No focal liver lesion is identified. There is diffuse increased echotexture of the liver. Portal vein is patent on color Doppler imaging with normal direction of blood flow towards the liver. Other: Small volume of ascites is identified in the right upper and right lower quadrant. IMPRESSION: No gallstones or sonographic Murphy sign identified. The gallbladder is decompressed with gallbladder wall measuring 5.3 mm which may be secondary to decompression and surrounding ascites. Fatty infiltration of liver. Small volume ascites in the right upper and right lower quadrants. Electronically Signed   By: Abelardo Diesel M.D.   On: 12/13/2019 09:15   DG ESOPHAGUS W SINGLE CM (SOL OR THIN BA)  Result Date: 12/17/2019 CLINICAL DATA:  Failure to thrive. History of esophageal stricture with dilatation. EXAM: ESOPHOGRAM/BARIUM SWALLOW TECHNIQUE: Single contrast examination was performed using  thin barium. FLUOROSCOPY TIME:  Fluoroscopy Time: 1 minutes and 18 seconds of low-dose pulsed fluoroscopy Radiation Exposure Index (if provided by the fluoroscopic device): 2.4 mGy Number of Acquired Spot Images: 0  COMPARISON:  Esophagram 03/10/2014 FINDINGS: The study is limited by the patient's limited mobility. Examination was performed in the supine and semi erect positions. Patient was not able to drink barium through a straw. Barium was administered per syringe into the patient's mouth during intermittent fluoroscopy. No laryngeal penetration or aspiration was observed. There is esophageal dysmotility  with a decreased primary stripping wave and moderate dilatation of the distal esophagus, similar to previous study. There is smooth narrowing of the distal esophageal lumen. No evidence of ulceration or mass. A barium tablet was administered and passed into the mid esophagus. Despite raising the head of the table as much as could be safely achieved, the pill did not pass beyond the mid esophageal level. Comparing the diameter of the pill with the lumen of the distal esophagus, I would expect the pill to pass if the patient were erect. IMPRESSION: 1. Similar appearance to previous examination, with esophageal dysmotility, dilatation and persistent smooth narrowing of the distal esophageal lumen. 2. A barium tablet was not observed to pass into the stomach, although this may be related to the required supine nature of the examination. The lumen of the distal esophagus appears slightly larger than the diameter of the pill. No evidence of esophageal ulceration. Electronically Signed   By: Richardean Sale M.D.   On: 12/17/2019 13:52    Assessment and Plan:   1.  Normocytic anemia-chronic 2.  Rheumatoid arthritis 3.  Severe malnutrition 4.   Oral candidiasis-treated with nystatin 5.   Esophageal dysmotility 6.   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  She saw Dr. Alen Blew in 2014.  The anemia was felt to potentially be related to myelodysplasia.  Myelodysplasia is in the differential diagnosis, but I feel the anemia is multifactorial.  The anemia is likely secondary to "chronic disease "in the setting of rheumatoid arthritis.  There is also a component due to severe malnutrition.  The peripheral blood smear findings are likely a reflection of severe malnutrition. The positive antibody screen may be related to rheumatoid arthritis.   The etiology of the malnutrition is unclear.  There is no evidence of an underlying malignancy based on the evaluation to  date.  Recommendations: 1.  Optimize nutrition 2.  Follow-up CBC as an outpatient, proceed with a bone marrow biopsy if the anemia does not improve with correction of the malnutrition 3.  Check DAT and reticulocyte count 4.  Outpatient follow-up will be scheduled in the hematology clinic.  Betsy Coder, MD 12/18/2019, 5:29 PM

## 2019-12-19 ENCOUNTER — Telehealth: Payer: Self-pay | Admitting: Oncology

## 2019-12-19 ENCOUNTER — Other Ambulatory Visit: Payer: Self-pay | Admitting: *Deleted

## 2019-12-19 DIAGNOSIS — R634 Abnormal weight loss: Secondary | ICD-10-CM | POA: Diagnosis not present

## 2019-12-19 DIAGNOSIS — E876 Hypokalemia: Secondary | ICD-10-CM | POA: Diagnosis not present

## 2019-12-19 DIAGNOSIS — K1230 Oral mucositis (ulcerative), unspecified: Secondary | ICD-10-CM

## 2019-12-19 DIAGNOSIS — K224 Dyskinesia of esophagus: Secondary | ICD-10-CM | POA: Diagnosis not present

## 2019-12-19 DIAGNOSIS — D649 Anemia, unspecified: Secondary | ICD-10-CM | POA: Diagnosis not present

## 2019-12-19 DIAGNOSIS — R531 Weakness: Secondary | ICD-10-CM | POA: Diagnosis not present

## 2019-12-19 DIAGNOSIS — R760 Raised antibody titer: Secondary | ICD-10-CM

## 2019-12-19 DIAGNOSIS — E43 Unspecified severe protein-calorie malnutrition: Secondary | ICD-10-CM | POA: Diagnosis not present

## 2019-12-19 DIAGNOSIS — R229 Localized swelling, mass and lump, unspecified: Secondary | ICD-10-CM | POA: Diagnosis not present

## 2019-12-19 DIAGNOSIS — K228 Other specified diseases of esophagus: Secondary | ICD-10-CM

## 2019-12-19 LAB — DIFFERENTIAL
Abs Immature Granulocytes: 0.02 10*3/uL (ref 0.00–0.07)
Basophils Absolute: 0 10*3/uL (ref 0.0–0.1)
Basophils Relative: 1 %
Eosinophils Absolute: 0 10*3/uL (ref 0.0–0.5)
Eosinophils Relative: 1 %
Immature Granulocytes: 1 %
Lymphocytes Relative: 12 %
Lymphs Abs: 0.4 10*3/uL — ABNORMAL LOW (ref 0.7–4.0)
Monocytes Absolute: 0.2 10*3/uL (ref 0.1–1.0)
Monocytes Relative: 7 %
Neutro Abs: 2.7 10*3/uL (ref 1.7–7.7)
Neutrophils Relative %: 78 %

## 2019-12-19 LAB — COMPREHENSIVE METABOLIC PANEL
ALT: 19 U/L (ref 0–44)
AST: 15 U/L (ref 15–41)
Albumin: <1 g/dL — ABNORMAL LOW (ref 3.5–5.0)
Alkaline Phosphatase: 77 U/L (ref 38–126)
Anion gap: 4 — ABNORMAL LOW (ref 5–15)
BUN: 16 mg/dL (ref 8–23)
CO2: 21 mmol/L — ABNORMAL LOW (ref 22–32)
Calcium: 6.8 mg/dL — ABNORMAL LOW (ref 8.9–10.3)
Chloride: 105 mmol/L (ref 98–111)
Creatinine, Ser: 0.5 mg/dL (ref 0.44–1.00)
GFR calc Af Amer: >60 mL/min (ref >60)
GFR calc non Af Amer: >60 mL/min (ref >60)
Glucose, Bld: 79 mg/dL (ref 70–99)
Potassium: 4.2 mmol/L (ref 3.5–5.1)
Sodium: 130 mmol/L — ABNORMAL LOW (ref 135–145)
Total Bilirubin: 0.1 mg/dL — ABNORMAL LOW (ref 0.3–1.2)
Total Protein: 4.6 g/dL — ABNORMAL LOW (ref 6.5–8.1)

## 2019-12-19 LAB — CBC
HCT: 26.7 % — ABNORMAL LOW (ref 36.0–46.0)
Hemoglobin: 8.8 g/dL — ABNORMAL LOW (ref 12.0–15.0)
MCH: 30.8 pg (ref 26.0–34.0)
MCHC: 33 g/dL (ref 30.0–36.0)
MCV: 93.4 fL (ref 80.0–100.0)
Platelets: 179 10*3/uL (ref 150–400)
RBC: 2.86 MIL/uL — ABNORMAL LOW (ref 3.87–5.11)
RDW: 15.8 % — ABNORMAL HIGH (ref 11.5–15.5)
WBC: 3.4 10*3/uL — ABNORMAL LOW (ref 4.0–10.5)
nRBC: 0 % (ref 0.0–0.2)

## 2019-12-19 LAB — DIRECT ANTIGLOBULIN TEST (NOT AT ARMC)
DAT, IgG: NEGATIVE
DAT, complement: NEGATIVE

## 2019-12-19 LAB — PHOSPHORUS: Phosphorus: 1.6 mg/dL — ABNORMAL LOW (ref 2.5–4.6)

## 2019-12-19 LAB — MAGNESIUM: Magnesium: 1.7 mg/dL (ref 1.7–2.4)

## 2019-12-19 LAB — PREALBUMIN: Prealbumin: <5 mg/dL — ABNORMAL LOW (ref 18–38)

## 2019-12-19 LAB — TRIGLYCERIDES: Triglycerides: 83 mg/dL (ref <150)

## 2019-12-19 MED ORDER — SODIUM PHOSPHATES 45 MMOLE/15ML IV SOLN
20.0000 mmol | Freq: Once | INTRAVENOUS | Status: AC
  Administered 2019-12-19: 11:00:00 20 mmol via INTRAVENOUS
  Filled 2019-12-19: qty 6.67

## 2019-12-19 MED ORDER — MAGNESIUM SULFATE 2 GM/50ML IV SOLN
2.0000 g | Freq: Once | INTRAVENOUS | Status: AC
  Administered 2019-12-19: 11:00:00 2 g via INTRAVENOUS
  Filled 2019-12-19: qty 50

## 2019-12-19 NOTE — Telephone Encounter (Signed)
Scheduled appt per 4/2 sch message - pt is aware of appt date and time

## 2019-12-19 NOTE — Progress Notes (Signed)
Nutrition Follow-up  DOCUMENTATION CODES:   Underweight, Severe malnutrition in context of chronic illness  INTERVENTION:   -Continue MVI with minerals daily -Continue 30 ml Prostat TID, each supplement provides 100 kcals and 15 grams protein -Continue with dysphagia 3 diet; agree with this diet, as this will give her the largest selection of foods that are soft, easy to chew; pt prefers softer textured foods like hot cereals and bananas, but is also requesting crackers for her soup  NUTRITION DIAGNOSIS:   Severe Malnutrition related to decreased appetite as evidenced by energy intake < or equal to 75% for > or equal to 1 month, severe fat depletion, moderate muscle depletion, severe muscle depletion.  Ongoing  GOAL:   Patient will meet greater than or equal to 90% of their needs  Progressing   MONITOR:   PO intake, Supplement acceptance, Diet advancement, Labs, Weight trends, Skin, I & O's  REASON FOR ASSESSMENT:   Consult New TPN/TNA  ASSESSMENT:   Deanna Cunningham is a 74 y.o. female presenting with weakness. PMH is significant for Rheumatoid Arthritis (no tx, no rheumatologist), chronic anemia and GERD.  4/2- PICC placed  Spoke with pt at bedside, who was pleasant and in good spirits today. She was finishing her breakfast and watching a court show on TV. Observed breakfast tray- which pt consumed 100% (gingerale, grits, chicken broth). Pt reports good tolerance of full liquids, but wishes she had something more substantial- she is requesting crackers for her soup and soft fruit, such as bananas.   Pt reports good tolerance of Prostat supplement. Denies diarrhea or acid reflux pain.   Case discussed with pharmacy. Original plan was to start TPN today, however, this is on hold. As pt has a functional GI tract, TPN would not be an ideal choice for nutrition support. Pt also has been consuming 75-100% of meals and supplements. Calorie count completed on 12/16/19 revealed pt  been consuming 96% of estimated calorie needs on average.   SNF recommended, however, pt daughter desires to take her home with home health services.   Labs reviewed: Na: 130. Mg: Phos 4.6. K and Mg WDL.   NUTRITION - FOCUSED PHYSICAL EXAM:    Most Recent Value  Orbital Region  Severe depletion  Upper Arm Region  Severe depletion  Thoracic and Lumbar Region  Severe depletion  Buccal Region  Severe depletion  Temple Region  Severe depletion  Clavicle Bone Region  Severe depletion  Clavicle and Acromion Bone Region  Severe depletion  Scapular Bone Region  Severe depletion  Dorsal Hand  Severe depletion  Patellar Region  Moderate depletion  Anterior Thigh Region  Moderate depletion  Posterior Calf Region  Moderate depletion  Edema (RD Assessment)  Moderate  Hair  Reviewed  Eyes  Reviewed  Mouth  Reviewed  Skin  Reviewed  Nails  Reviewed       Diet Order:   Diet Order            DIET DYS 3 Room service appropriate? Yes; Fluid consistency: Thin  Diet effective now              EDUCATION NEEDS:   Education needs have been addressed  Skin:  Skin Assessment: Reviewed RN Assessment  Last BM:  12/19/19  Height:   Ht Readings from Last 1 Encounters:  12/13/19 5\' 4"  (1.626 m)    Weight:   Wt Readings from Last 1 Encounters:  12/19/19 48.5 kg    Ideal Body Weight:  54.5 kg  BMI:  Body mass index is 18.35 kg/m.  Estimated Nutritional Needs:   Kcal:  1600-1800  Protein:  85-100 grams  Fluid:  > 1.6 L    [name], RD, LDN, [place] Registered Dietitian II Certified Diabetes Care and Education Specialist Please refer to [facility] for RD and/or RD on-call/weekend/after hours pager

## 2019-12-19 NOTE — Plan of Care (Signed)
  Problem: Pain Managment: Goal: General experience of comfort will improve Outcome: Progressing   Problem: Safety: Goal: Ability to remain free from injury will improve Outcome: Progressing   Problem: Skin Integrity: Goal: Risk for impaired skin integrity will decrease Outcome: Progressing   

## 2019-12-19 NOTE — Plan of Care (Signed)
  Problem: Safety: Goal: Ability to remain free from injury will improve Outcome: Progressing   

## 2019-12-19 NOTE — Progress Notes (Signed)
Pt requesting all four bedrails up.

## 2019-12-19 NOTE — Progress Notes (Addendum)
Daily Rounding Note For Dr Collene Mares  12/19/2019, 9:27 AM  LOS: 7 days   SUBJECTIVE:   Chief complaint: Dysphagia and protein calorie malnutrition. Has done very well with full liquids this morning all of the bowls and cups are empty.  Had soft bowel movement this morning.  OBJECTIVE:         Vital signs in last 24 hours:    Temp:  [97.7 F (36.5 C)-98.4 F (36.9 C)] 98.4 F (36.9 C) (04/02 0724) Pulse Rate:  [72-98] 92 (04/02 0724) Resp:  [15-16] 15 (04/02 0724) BP: (104-115)/(62-77) 110/62 (04/02 0724) SpO2:  [93 %-98 %] 93 % (04/02 0450) Weight:  [48.5 kg] 48.5 kg (04/02 0600) Last BM Date: 12/19/19 Filed Weights   12/13/19 1400 12/17/19 0500 12/19/19 0600  Weight: 42.2 kg 44 kg 48.5 kg   General: Painfully thin, alert, comfortable Heart: RRR. Chest: Clear.  No labored breathing or cough. Abdomen: Soft, thin, nontender, active bowel sounds. Extremities: Sarcopenia, skeletal structure clearly visible beneath skin. Neuro/Psych: Oriented x3.  Calm, pleasant, fluid speech.  No gross deficits.  No tremors.   Lab Results: Recent Labs    12/17/19 0507 12/17/19 0507 12/17/19 2021 12/18/19 0155 12/19/19 0437  WBC 3.7*  --   --  4.0 3.4*  HGB 6.8*   < > 9.6* 8.8* 8.8*  HCT 20.6*   < > 28.0* 26.4* 26.7*  PLT 166  --   --  162 179   < > = values in this interval not displayed.   BMET Recent Labs    12/17/19 0507 12/18/19 0155 12/19/19 0437  NA 132* 132* 130*  K 4.4 4.1 4.2  CL 108 108 105  CO2 19* 19* 21*  GLUCOSE 81 84 79  BUN 16 16 16   CREATININE 0.55 0.50 0.50  CALCIUM 7.1* 6.9* 6.8*   LFT Recent Labs    12/19/19 0437  PROT 4.6*  ALBUMIN <1.0*  AST 15  ALT 19  ALKPHOS 77  BILITOT 0.1*    Studies/Results: CT ABDOMEN PELVIS W CONTRAST  Result Date: 12/18/2019 CLINICAL DATA:  Painless jaundice and weight loss EXAM: CT ABDOMEN AND PELVIS WITH CONTRAST TECHNIQUE: Multidetector CT imaging of the  abdomen and pelvis was performed using the standard protocol following bolus administration of intravenous contrast. CONTRAST:  13mL OMNIPAQUE IOHEXOL 300 MG/ML  SOLN COMPARISON:  08/08/2018 FINDINGS: Lower chest: Emphysematous changes are noted in the bases bilaterally. Small left pleural effusion is seen. No focal confluent infiltrate is noted. Hepatobiliary: Liver demonstrates fatty infiltration. The gallbladder is decompressed. Perihepatic fluid is noted consistent with mild ascites. Pancreas: The pancreas is within normal limits. Spleen: The spleen is unremarkable. Adrenals/Urinary Tract: Adrenal glands are within normal limits. Kidneys demonstrate a normal enhancement pattern bilaterally. No obstructive changes are seen. The bladder is decompressed. Normal excretion of contrast is seen on delayed images. Stomach/Bowel: Diverticular change of the colon is noted without evidence of diverticulitis. Considerable dense barium is noted throughout the left colon with considerable scatter artifact. No obstructive changes are seen. The appendix is not well visualized. No obstructive changes in the small bowel are seen. The stomach is decompressed. Vascular/Lymphatic: Aortic calcifications are seen without aneurysmal dilatation. No definitive lymphadenopathy is noted. Reproductive: Uterus demonstrates scattered calcifications stable from the prior exam. This may represent small fibroids. No adnexal mass is noted. Other: Mild free pelvic fluid is noted. Changes of anasarca are noted throughout the abdomen and pelvis. Musculoskeletal: Degenerative changes of  lumbar spine are noted. No acute bony abnormality is seen. Anterolisthesis of L4 on L5 is noted. IMPRESSION: Changes of anasarca and ascites. This is likely related underlying liver disease. Fatty liver. Diverticulosis without diverticulitis. No other definitive acute abnormality is noted. No explanation for the patient's jaundice is noted Electronically Signed   By:  Inez Catalina M.D.   On: 12/18/2019 20:29   [place] EKG SITE RITE  Result Date: 12/18/2019 If Site Rite image not attached, placement could not be confirmed due to current cardiac rhythm.  DG ESOPHAGUS W SINGLE CM (SOL OR THIN BA)  Result Date: 12/17/2019 CLINICAL DATA:  Failure to thrive. History of esophageal stricture with dilatation. EXAM: ESOPHOGRAM/BARIUM SWALLOW TECHNIQUE: Single contrast examination was performed using  thin barium. FLUOROSCOPY TIME:  Fluoroscopy Time: 1 minutes and 18 seconds of low-dose pulsed fluoroscopy Radiation Exposure Index (if provided by the fluoroscopic device): 2.4 mGy Number of Acquired Spot Images: 0 COMPARISON:  Esophagram 03/10/2014 FINDINGS: The study is limited by the patient's limited mobility. Examination was performed in the supine and semi erect positions. Patient was not able to drink barium through a straw. Barium was administered per syringe into the patient's mouth during intermittent fluoroscopy. No laryngeal penetration or aspiration was observed. There is esophageal dysmotility with a decreased primary stripping wave and moderate dilatation of the distal esophagus, similar to previous study. There is smooth narrowing of the distal esophageal lumen. No evidence of ulceration or mass. A barium tablet was administered and passed into the mid esophagus. Despite raising the head of the table as much as could be safely achieved, the pill did not pass beyond the mid esophageal level. Comparing the diameter of the pill with the lumen of the distal esophagus, I would expect the pill to pass if the patient were erect. IMPRESSION: 1. Similar appearance to previous examination, with esophageal dysmotility, dilatation and persistent smooth narrowing of the distal esophageal lumen. 2. A barium tablet was not observed to pass into the stomach, although this may be related to the required supine nature of the examination. The lumen of the distal esophagus appears slightly  larger than the diameter of the pill. No evidence of esophageal ulceration. Electronically Signed   By: Richardean Sale M.D.   On: 12/17/2019 13:52   Scheduled Meds: . sodium chloride   Intravenous Once  . Chlorhexidine Gluconate Cloth  6 each Topical Daily  . famotidine  20 mg Oral BID  . feeding supplement (PRO-STAT SUGAR FREE 64)  30 mL Oral TID BM  . mouth rinse  15 mL Mouth Rinse BID  . multivitamin with minerals  1 tablet Oral Daily  . nystatin  5 mL Oral QID  . pantoprazole  40 mg Oral Daily  . sodium chloride flush  10-40 mL Intracatheter Q12H  . sucralfate  1 g Oral TID WC & HS   Continuous Infusions: . magnesium sulfate bolus IVPB    . sodium phosphate  Dextrose 5% IVPB     PRN Meds:.acetaminophen **OR** acetaminophen, magic mouthwash, prochlorperazine, sodium chloride flush  ASSESMENT:   *  Severe PCM.  RD following, calorie count in progress as of 3/28, no dietary notes since that date.  *    Periumbilical pain, resolved.  Improved with Pepcid bid and Protonix bid.    *    Dysphagia.  Presbyesophagus.  Hx of cervical web.  Persistent smooth narrowing in distal esophagus with nonpassage of barium tablet on DG esophagus 3/31. Patient is tolerating full liquid consistencies  but avoids dairy products due to lactose intolerance.  She is wise about making her own dietary choices  *    Chronic normocytic anemia.  Possible myelodysplasia but patient declined offer of bone marrow several years ago.  Dr. Julieanne Manson feels the anemia is multifactorial.  *    Rheumatoid arthritis.   PLAN   *  Advance to soft diet which allows pt to choose her own foods and avoid lactose.    *   Dr Carlean Purl on call and covering for Dr Benson Norway an Dr Collene Mares, available over weekend but do not plan to see pt unless neede.      Azucena Freed  12/19/2019, 9:27 AM Phone Bennett Attending   I have taken an interval history, reviewed the chart and examined the patient. I agree with  the Advanced Practitioner's note, impression and recommendations.   Esophageal dysmotility Severe weight loss and malnutrition No signs of solid organ malignancy on studies so far  She is eating a bit more, it seems  I have no new recommendations. Dr. Collene Mares returns Monday  Gatha Mayer, MD, [facility] 12/19/2019 7:24 PM

## 2019-12-19 NOTE — Hospital Course (Addendum)
Deanna Cunningham is a 74 y.o. female who presented with weakness. PMH is significant for Rheumatoid Arthritis (no tx, no rheumatologist), chronic anemia and GERD.  Her hospital course is outlined below.   Severe protein calorie malnutrition Patient admitted for generalized weakness and poor po intake.  Admission details can be found in H&P.  Chest x-ray with only atelectasis of left lung.  Patient had a right upper quadrant ultrasound that was unrevealing.  Dietitian was consulted and perform calorie count.  Patient was not having adequate p.o. intake.  Family requested that F PTS reach out to her outpatient GI physician, Dr. Collene Mares, who was consulted.  Dr. Collene Mares had performed extensive outpatient work-up of patient's poor p.o. intake. Esophagram was performed on 3/31 that showed esophageal dysmotility, dilatation and persistent smooth narrowing of thedistal esophageal lumen. Patient was started on a FLD with a plan for repeat EKG when albumin increased to 2.5. She intermittently failed her calorie goal. 4/2 PICC placed. 4/5 TPN initiated. 4/10 TPN was discontinue d/t patient meeting her calorie goal. 4/17 TPN restarted due to nausea, vomiting, poor oral intake. 4/19 Surgery consulted and felt as if endoscopic procedure will be considerably safer than surgery in light of her significant protein calorie malnutrition and recommended PEG by GI. 4/20- transitioned to cyclic TPN. Albumin reached 2.5 on 5/13 and 2.6 on 5/16.  Patient's TPN was stopped on 5/14.  TPN was resumed Monday Wednesday Friday from 5/17 which patient should continue for the first week after discharge to SNF and thereafter wean. Patient underwent EGD with Dr. Benson Norway with dilation on 5/19, recommends repeat dilation and EGD two weeks after first (approx 6/1).  Metabolic derangements Patient had various metabolic derangements including hypokalemia, hypomagnesemia, hypophosphatemia.  These were likely in the setting of refeeding syndrome as patient  had increase her p.o. intake during the hospitalization. Electrolytes were very stable throughout rest of admission, however likely spurious hypokalemia on 5/24 as repeat stable on 5/25 at 4.2 and no GI losses. Recommend BMP in 1 week after discharge to ensure electrolyte stability.   Weakness Patient initially endorsed lower extremity weakness worse than upper extremity weakness on admission.  She had a known history of lumbar spondylosis with no significant change in imaging from February to March.  Given her report however imaging was performed of the cervical and thoracic spine as well.  This showed multilevel cervical disc degeneration with mild spinal stenosis at C4-C5 and C5-C6 with no cord compression.  Moderate neuroforaminal stenosis on the right at C4-C5 and on the left at C5-C6 and mild thoracic spondylosis without stenosis.  None of these findings were consistent with her complaints.  Her spinal surgeon Dr. Lynann Bologna was messaged through his office, but he was on vacation during the week of her hospitalization.  Recommend that she follow-up with him as an outpatient.   Oral candidiasis Patient found to have oral candidiasis on admission.  She was started on nystatin swish and swallow on 3/29.  Repeat EGD on 5/19 showed esophageal stricture which was dilated and esophageal candidiasis.  Patient was started on Diflucan IV and GI recommended repeat dilatation in 2 weeks time.   Anemia Patient noted to have significant anemia throughout her admission.  On 3/31, hemoglobin fell to 6.8, she was given 1 unit of PRBCs It was noted that previously patient had had work-up for possible myelodysplastic syndrome, but had declined bone marrow biopsy.  Will refer to hematology/oncology on discharge. Patient received an additional unit of pRBCs for hemoglobin  of 6.8 on 01/19/20 and hemoglobin improved to 8.5. CBCs were monitored weekly during this admission.   Pressure sores on heels Patient noticed pressure  sores had developed on her heels during the weekend of 5/22-5/23, R>L with dry eschar. Wounds were dressed and cleaned by wound care consult and padded boots to prevent pressure sores given. Recommend follow up with outpatient wound care with daily dressing changes and preventative measures such as regularly changing position.   Issues for Follow Up:   One episode of hyperkalemia prior to discharge. Likely lab error. Her kidney function is normal. K maintained through entire hospitalization. Her K was normal the next day. Please check BMP o/p  x 1  Pressure sores on heels: ensure regular dressing changes, position changes, and follow up with outpatient wound care for resolution Patient was referred to Rheumatology on discharge given untreated rheumatoid arthritis. Patient was referred to hematology/oncology on discharge given her profound anemia and history of possible myelodysplasia. Recommend follow up with cardiology and spinal surgery as outpatient. Recommend close follow-up with GI, repeat endoscopy in 2 weeks after discharge for dilatation of esophagus Tinea versicolor under L breast, treat with clotrimazole cr 1% BID x2 weeks, ensure area is clear after treatment. Patient concerned that L breast larger than R. Last mammogram 05/2018 WNL, due for last one this year. Normal exam, recommend repeat mammogram this year for reassurance.

## 2019-12-19 NOTE — Progress Notes (Signed)
Physical Therapy Treatment Patient Details Name: Deanna Cunningham MRN: [id number] DOB: 08-24-1946 Today's Date: 12/19/2019    History of Present Illness Pt is a 74 y.o. female admitted on 12/12/19 for progressive weakness, poor po intake, bil LE swelling.  Pt dx with severe protein calorie malnutrition (concern for malignancy), medical workup in progress.  Pt also dx with anemia acute on chronic, hypokalemia, hypomagnesemia, stage 1 sacral wound, QT prolongation, and hypocalcemia.  Pt with significant PMH of dysphagia (s/p EGD with esophageal dialation), CKD III, anemia, RA.      PT Comments    Pt making steady progress with functional mobility as indicated by progression to gait training. HR fluctuating from low 110's to mid 130's with activity. Pt would continue to benefit from skilled physical therapy services at this time while admitted and after d/c to address the below listed limitations in order to improve overall safety and independence with functional mobility.    Follow Up Recommendations  SNF     Equipment Recommendations  3in1 (PT)    Recommendations for Other Services       Precautions / Restrictions Precautions Precautions: Fall Restrictions Weight Bearing Restrictions: No    Mobility  Bed Mobility Overal bed mobility: Needs Assistance Bed Mobility: Sit to Supine       Sit to supine: Min assist   General bed mobility comments: increased time and effort, use of gait belt to allow pt to assist with bilateral LE return to bed with use of UEs; min A to fully lie back down  Transfers Overall transfer level: Needs assistance Equipment used: Rolling walker (2 wheeled) Transfers: Sit to/from Stand Sit to Stand: Mod assist         General transfer comment: increased time and effort, use of momentum, cueing for safe hand placement, assistance needed to power into standing from recliner chair x1 and from toilet x1  Ambulation/Gait Ambulation/Gait assistance: Min  guard Gait Distance (Feet): 30 Feet Assistive device: Rolling walker (2 wheeled) Gait Pattern/deviations: Step-through pattern;Decreased stride length;Trunk flexed Gait velocity: decreased   General Gait Details: cueing needed to maintain proximity to RW; min guard for safety and RW management   Stairs             Wheelchair Mobility    Modified Rankin (Stroke Patients Only)       Balance Overall balance assessment: Needs assistance Sitting-balance support: Feet supported Sitting balance-Leahy Scale: Fair     Standing balance support: Bilateral upper extremity supported;During functional activity Standing balance-Leahy Scale: Poor                              Cognition Arousal/Alertness: Awake/alert Behavior During Therapy: WFL for tasks assessed/performed Overall Cognitive Status: Within Functional Limits for tasks assessed                                        Exercises      General Comments        Pertinent Vitals/Pain Pain Assessment: No/denies pain    Home Living                      Prior Function            PT Goals (current goals can now be found in the care plan section) Acute Rehab PT Goals PT Goal Formulation:  With patient Time For Goal Achievement: 12/27/19 Potential to Achieve Goals: Fair Progress towards PT goals: Progressing toward goals    Frequency    Min 3X/week      PT Plan Current plan remains appropriate    Co-evaluation              AM-PAC PT "6 Clicks" Mobility   Outcome Measure  Help needed turning from your back to your side while in a flat bed without using bedrails?: A Little Help needed moving from lying on your back to sitting on the side of a flat bed without using bedrails?: A Little Help needed moving to and from a bed to a chair (including a wheelchair)?: A Little Help needed standing up from a chair using your arms (e.g., wheelchair or bedside chair)?: A  Lot Help needed to walk in hospital room?: A Little Help needed climbing 3-5 steps with a railing? : A Lot 6 Click Score: 16    End of Session Equipment Utilized During Treatment: Gait belt Activity Tolerance: Patient limited by fatigue Patient left: in bed;with call bell/phone within reach;with bed alarm set Nurse Communication: Mobility status PT Visit Diagnosis: Other abnormalities of gait and mobility (R26.89);Muscle weakness (generalized) (M62.81)     Time: [id number] PT Time Calculation (min) (ACUTE ONLY): 34 min  Charges:  $Gait Training: 8-22 mins $Therapeutic Activity: 8-22 mins                     Anastasio Champion, DPT  Acute Rehabilitation Services Pager [phone] Office [place] 12/19/2019, 3:39 PM

## 2019-12-19 NOTE — Progress Notes (Signed)
Rehab Admissions Coordinator Note:  Per MD request, patient was screened by Michel Santee for appropriateness for an Inpatient Acute Rehab Consult.  At this time, pt does not have a diagnosis amenable to rehab and she is too high level for our program, with therapy recommendations for SNF.  Would recommend f/u at a lower level of care (SNF per PT recommendation, versus home health).   Michel Santee 12/19/2019, 1:54 PM  I can be reached at [phone].

## 2019-12-19 NOTE — Progress Notes (Signed)
Family Medicine Teaching Service Daily Progress Note Intern Pager: [phone]  Patient name: Deanna Cunningham Medical record number: [id number] Date of birth: Jan 23, 1946 Age: 74 y.o. Gender: female  Primary Care Provider: Kristie Cowman, MD Consultants: None, consider reaching out to patient's orthopedist Code Status: Full code  Pt Overview and Major Events to Date:  3/26-patient admitted for weakness, malnutrition  Assessment and Plan: Deanna Cunningham is a 74 y.o. female presenting with weakness. PMH is significant for Rheumatoid Arthritis (no tx, no rheumatologist), chronic anemia and GERD.  Severe protein calorie malnutrition Tolerating FLD. Cachexia. -Reconsult nutrition for continued calorie needs -Consulting GI, Per Dr Benson Norway who recommended: CT abd/pelvis (diverticulosis, worsening anasarca and ascites, advancing diet as tolerated -Prostat shakes  TID -PT/OT   Hypotension BP 110/62. No dizziness, shortness of breath. Orthostatics positive. -Continue to monitor symptoms  GERD Endorse abdominal discomfort/fullness and belching. -Famotidine 40mg  BID -Continue Protinix 40mg  -Sucralfate 1g TID, qhs -TUMS prn   Electrolyte abnormalities suspect refeeding syndrome  K 4.1>4.2, Phosph 2.8>1.6. Mg 2.1>.7, Na 132.  -Replete as necessary -Consider IV hydration if Na does not improve with oral  Oral candidiasis White plaques underneath patient's tongue. KOH positive for yeast. QTC 515>505. Repeat EKG on 3/30 showed QTc 509. S/p Fluconazole, switched to Nystatin on 3/29  -Continue Nystatin QID  Anemia, acute on chronic s/p 1 U pRBCs Hb 7.1>6.8>s/p 1U 8.8. Peripheral smear: Hypochromic anemia. 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 -Monitor CBC daily   Sacral wound Stable -Keep area clean and covered, add padding to protect her skin -Recommend frequent position changes while lying in  bed -Encourage out of bed  QT prolongation EKG showed SR, PVCs, RBBB and QTc 515>505>509 -Avoid QT prolonging medications   Rheumatoid arthritis Ulnar deviation of MCP joints bilaterally. Does not have Rheumatologist and is not on DMARDs. -Monitor pain   -Ambulatory referral to Rheum on discharge   FEN/GI: FLD Prophylaxis: SCDs  Disposition: Pending CIR and nutrition recommendation  Subjective:  She is feeling better.   Objective: Temp:  [97.7 F (36.5 C)-98.4 F (36.9 C)] 98.4 F (36.9 C) (04/02 0724) Pulse Rate:  [72-98] 92 (04/02 0724) Resp:  [15-16] 15 (04/02 0724) BP: (104-115)/(62-77) 110/62 (04/02 0724) SpO2:  [93 %-98 %] 93 % (04/02 0450) Weight:  [48.5 kg] 48.5 kg (04/02 0600)   Physical Exam:  General: Appears unwell, no acute distress. Age appropriate. Cachectic.  Cardiac: RRR, normal heart sounds, no murmurs Respiratory: CTAB, normal effort Abdomen: soft, nontender, nondistended, +BS Extremities: mild pitting edema   Laboratory: Recent Labs  Lab 12/17/19 0507 12/17/19 0507 12/17/19 2021 12/18/19 0155 12/19/19 0437  WBC 3.7*  --   --  4.0 3.4*  HGB 6.8*   < > 9.6* 8.8* 8.8*  HCT 20.6*   < > 28.0* 26.4* 26.7*  PLT 166  --   --  162 179   < > = values in this interval not displayed.   Recent Labs  Lab 12/12/19 1317 12/12/19 1326 12/13/19 0224 12/14/19 0640 12/17/19 0507 12/18/19 0155 12/19/19 0437  NA   < >  --  136   < > 132* 132* 130*  K   < >  --  3.6   < > 4.4 4.1 4.2  CL   < >  --  110   < > 108 108 105  CO2   < >  --  18*   < > 19*  19* 21*  BUN   < >  --  20   < > 16 16 16   CREATININE   < >  --  0.79   < > 0.55 0.50 0.50  CALCIUM   < >  --  7.3*   < > 7.1* 6.9* 6.8*  PROT  --  5.4* 5.2*  --   --   --  4.6*  BILITOT  --  0.3 0.4  --   --   --  0.1*  ALKPHOS  --  86 90  --   --   --  77  ALT  --  26 24  --   --   --  19  AST  --  21 21  --   --   --  15  GLUCOSE   < >  --  100*   < > 81 84 79   < > = values in this interval  not displayed.    Imaging/Diagnostic Tests: CT ABDOMEN AND PELVIS WITH CONTRAST COMPARISON:  08/08/2018 IMPRESSION: Changes of anasarca and ascites. This is likely related underlying liver disease. Fatty liver. Diverticulosis without diverticulitis. No other definitive acute abnormality is noted. No explanation for the patient's jaundice is noted  Gerlene Fee, DO 12/19/2019, 8:11 AM PGY-1, [place] Intern pager: [phone], text pages welcome

## 2019-12-19 NOTE — Progress Notes (Signed)
Hospital follow-up, anemia   Schedule office visit and CBC/blood bank with [name] or Sherrill l in approximately 4 weeks  Orders placed and scheduling message sent

## 2019-12-19 NOTE — Progress Notes (Signed)
IP PROGRESS NOTE  Subjective:   No complaint.  She appears unchanged.  Objective: Vital signs in last 24 hours: Blood pressure 105/62, pulse (!) 59, temperature 98.2 F (36.8 C), temperature source Oral, resp. rate 14, height 5\' 4"  (1.626 m), weight 106 lb 14.8 oz (48.5 kg), SpO2 (!) 89 %.  Intake/Output from previous day: 04/01 0701 - 04/02 0700 In: 260 [P.O.:240; I.V.:20] Out: -   Physical Exam:  Not performed today  Lab Results: Recent Labs    12/18/19 0155 12/19/19 0437  WBC 4.0 3.4*  HGB 8.8* 8.8*  HCT 26.4* 26.7*  PLT 162 179   DAT: Negative, reticulocyte count 1.3% BMET Recent Labs    12/18/19 0155 12/19/19 0437  NA 132* 130*  K 4.1 4.2  CL 108 105  CO2 19* 21*  GLUCOSE 84 79  BUN 16 16  CREATININE 0.50 0.50  CALCIUM 6.9* 6.8*    No results found for: CEA1  Studies/Results: CT ABDOMEN PELVIS W CONTRAST  Result Date: 12/18/2019 CLINICAL DATA:  Painless jaundice and weight loss EXAM: CT ABDOMEN AND PELVIS WITH CONTRAST TECHNIQUE: Multidetector CT imaging of the abdomen and pelvis was performed using the standard protocol following bolus administration of intravenous contrast. CONTRAST:  182mL OMNIPAQUE IOHEXOL 300 MG/ML  SOLN COMPARISON:  08/08/2018 FINDINGS: Lower chest: Emphysematous changes are noted in the bases bilaterally. Small left pleural effusion is seen. No focal confluent infiltrate is noted. Hepatobiliary: Liver demonstrates fatty infiltration. The gallbladder is decompressed. Perihepatic fluid is noted consistent with mild ascites. Pancreas: The pancreas is within normal limits. Spleen: The spleen is unremarkable. Adrenals/Urinary Tract: Adrenal glands are within normal limits. Kidneys demonstrate a normal enhancement pattern bilaterally. No obstructive changes are seen. The bladder is decompressed. Normal excretion of contrast is seen on delayed images. Stomach/Bowel: Diverticular change of the colon is noted without evidence of diverticulitis.  Considerable dense barium is noted throughout the left colon with considerable scatter artifact. No obstructive changes are seen. The appendix is not well visualized. No obstructive changes in the small bowel are seen. The stomach is decompressed. Vascular/Lymphatic: Aortic calcifications are seen without aneurysmal dilatation. No definitive lymphadenopathy is noted. Reproductive: Uterus demonstrates scattered calcifications stable from the prior exam. This may represent small fibroids. No adnexal mass is noted. Other: Mild free pelvic fluid is noted. Changes of anasarca are noted throughout the abdomen and pelvis. Musculoskeletal: Degenerative changes of lumbar spine are noted. No acute bony abnormality is seen. Anterolisthesis of L4 on L5 is noted. IMPRESSION: Changes of anasarca and ascites. This is likely related underlying liver disease. Fatty liver. Diverticulosis without diverticulitis. No other definitive acute abnormality is noted. No explanation for the patient's jaundice is noted Electronically Signed   By: Inez Catalina M.D.   On: 12/18/2019 20:29   [place] EKG SITE RITE  Result Date: 12/18/2019 If Site Rite image not attached, placement could not be confirmed due to current cardiac rhythm.   Medications: I have reviewed the patient's current medications.  Assessment/Plan: 1.  Normocytic anemia-chronic 2.  Rheumatoid arthritis 3.  Severe malnutrition 4.   Oral candidiasis-treated with nystatin 5.   Esophageal dysmotility 6.   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  There is no evidence for hemolysis.  Hopefully the anemia will improve with resolution of the severe malnutrition.  Recommendations: 1.  Optimize nutrition 2.  Please call Hematology as needed over the weekend 3.  I will check on her 12/22/2019 if she remains in the hospital.  Outpatient follow-up will be scheduled in the hematology clinic.   LOS: 7 days   Betsy Coder, MD   12/19/2019, 2:57 PM

## 2019-12-20 DIAGNOSIS — E876 Hypokalemia: Secondary | ICD-10-CM | POA: Diagnosis not present

## 2019-12-20 DIAGNOSIS — R531 Weakness: Secondary | ICD-10-CM | POA: Diagnosis not present

## 2019-12-20 LAB — BASIC METABOLIC PANEL
Anion gap: 4 — ABNORMAL LOW (ref 5–15)
BUN: 16 mg/dL (ref 8–23)
CO2: 21 mmol/L — ABNORMAL LOW (ref 22–32)
Calcium: 6.7 mg/dL — ABNORMAL LOW (ref 8.9–10.3)
Chloride: 108 mmol/L (ref 98–111)
Creatinine, Ser: 0.51 mg/dL (ref 0.44–1.00)
GFR calc Af Amer: >60 mL/min (ref >60)
GFR calc non Af Amer: >60 mL/min (ref >60)
Glucose, Bld: 84 mg/dL (ref 70–99)
Potassium: 4.1 mmol/L (ref 3.5–5.1)
Sodium: 133 mmol/L — ABNORMAL LOW (ref 135–145)

## 2019-12-20 LAB — MAGNESIUM: Magnesium: 1.9 mg/dL (ref 1.7–2.4)

## 2019-12-20 LAB — PHOSPHORUS: Phosphorus: 1.4 mg/dL — ABNORMAL LOW (ref 2.5–4.6)

## 2019-12-20 MED ORDER — K PHOS MONO-SOD PHOS DI & MONO 155-852-130 MG PO TABS
250.0000 mg | ORAL_TABLET | Freq: Three times a day (TID) | ORAL | Status: AC
  Administered 2019-12-20 (×3): 250 mg via ORAL
  Filled 2019-12-20 (×4): qty 1

## 2019-12-20 NOTE — Progress Notes (Signed)
Family medicine notified about pt's tachycardia and PVC's on tele. No new orders at this time.

## 2019-12-20 NOTE — Plan of Care (Signed)
  Problem: Safety: Goal: Ability to remain free from injury will improve Outcome: Progressing   Problem: Skin Integrity: Goal: Risk for impaired skin integrity will decrease Outcome: Progressing   

## 2019-12-20 NOTE — Progress Notes (Addendum)
Family Medicine Teaching Service Daily Progress Note Intern Pager: [phone]  Patient name: Deanna Cunningham Medical record number: [id number] Date of birth: [birth year] Age: 74 y.o. Gender: female  Primary Care Provider: Kristie Cowman, MD Consultants: None, consider reaching out to patient's orthopedist Code Status: Full code  Pt Overview and Major Events to Date:  3/26-patient admitted for weakness, malnutrition   Assessment and Plan: Deanna Cunningham is a 74 y.o. female presenting with weakness. PMH is significant for Rheumatoid Arthritis (no tx, no rheumatologist), chronic anemia and GERD.  Underweight, severe protein calorie malnutrition: Improving, stable. Multifactorial in the context of esophageal dysmotility, oral candidiasis, physical limitations, chronic illness, and proceeding anemia.  GI work-up thus far negative for solid organ malignancy. Meeting 96% of nutritional needs, thus decided on holding starting TPN on 4/2.  Not candidate for CIR.  -Dietitian on board, would appreciate continued caloric counting to meet appropriate needs -Continue MVI, 30 mL prostat TID, and dysphagia 3 diet per nutrition -GI on board, appreciate recommendations thus far, to see again on 4/5  -Daily weights, strict I&O (weight uptrending) -Work with social work/PT/OT for disposition  Esophageal dysmotility: Stable. Evident on 3/31 barium swallow.  Tolerating dysphagia diet well currently. -Continue to monitor feeding -Monitor mag, Phos due to refeeding syndrome and replete as appropriate  Normocytic, hypochromic anemia: Chronic, currently stable. Hgb 8.8, around baseline. S/p 1 U RBCs total during admit. Multifactorial, in setting of likely anemia of chronic disease, severe malnutrition.  Previous concern for myelodysplastic syndrome in 2014, oncology recommending follow-up outpatient for further evaluation with bone marrow biopsy if not improving with malnutrition correction. -Heme/onc  consulted and appreciate recommendations, will follow outpatient -Monitor CBC  GERD: Chronic, improved. Minimal symptoms on Pepcid and Protonix. -Famotidine 97m BID -Protonix 40 mg -Sucralfate with meals/bedtime  Oral candidiasis: Resolved. -Dc'd nystatin swish and swallow -Magic mouthwash    Sacral wound: stable.  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 Prolonged QTC 509 on 3/31.  Follows with [facility], Dr. SGardiner Rhyme outpatient.  -Avoid QT prolonging medications  Rheumatoid arthritis Ulnar deviation of MCP joints bilaterally. Does not have Rheumatologist and is not on DMARDs. -Monitor pain   -Ambulatory referral to Rheum on discharge   FEN/GI: Dysphagia 3, replete electrolytes as appropriate Prophylaxis: Encourage ambulation, SCDs   Disposition: Continued medical management  Subjective:  No acute events overnight, starting to feel better.  Eating well with good appetite, still belches on occasion.  Feeling stronger and less weak compared to before.  Objective: Temp:  [97.5 F (36.4 C)-98.2 F (36.8 C)] 98 F (36.7 C) (04/03 0750) Pulse Rate:  [56-98] 98 (04/03 0750) Resp:  [14-18] 18 (04/03 0750) BP: (95-107)/(54-64) 101/59 (04/03 0750) SpO2:  [89 %-100 %] 99 % (04/03 0750)   Physical Exam: General: Chronically ill-appearing, cachectic older female in no acute distress.  Sitting up eating breakfast. HEENT: NCAT, no oropharyngeal white patches seen Cardiac: Occasional irregular rate, 2/6 holosystolic murmur best heard at apex Lungs: Clear bilaterally, no increased WOB, cachectic with all ribs showing anteriorly Abdomen: soft, non-tender Msk: Moves all extremities spontaneously  Ext: Warm, dry, 2+ distal pulses, 1-2+ pitting edema to level of knee bilaterally  Laboratory: Recent Labs  Lab 12/17/19 0507  12/17/19 0507 12/17/19 2021 12/18/19 0155 12/19/19 0437  WBC 3.7*  --   --  4.0 3.4*  HGB 6.8*   < >  9.6* 8.8* 8.8*  HCT 20.6*   < > 28.0* 26.4* 26.7*  PLT 166  --   --  162 179   < > = values in this interval not displayed.   Recent Labs  Lab 12/18/19 0155 12/19/19 0437 12/20/19 0332  NA 132* 130* 133*  K 4.1 4.2 4.1  CL 108 105 108  CO2 19* 21* 21*  BUN 16 16 16   CREATININE 0.50 0.50 0.51  CALCIUM 6.9* 6.8* 6.7*  PROT  --  4.6*  --   BILITOT  --  0.1*  --   ALKPHOS  --  77  --   ALT  --  19  --   AST  --  15  --   GLUCOSE 84 79 84    Imaging/Diagnostic Tests: CT ABDOMEN AND PELVIS WITH CONTRAST COMPARISON:  08/08/2018 IMPRESSION: Changes of anasarca and ascites. This is likely related underlying liver disease. Fatty liver. Diverticulosis without diverticulitis. No other definitive acute abnormality is noted. No explanation for the patient's jaundice is noted  Patriciaann Clan, DO 12/20/2019, 8:12 AM PGY-2, [place] Intern pager: [phone], text pages welcome

## 2019-12-21 DIAGNOSIS — R531 Weakness: Secondary | ICD-10-CM | POA: Diagnosis not present

## 2019-12-21 LAB — BASIC METABOLIC PANEL
Anion gap: 2 — ABNORMAL LOW (ref 5–15)
BUN: 21 mg/dL (ref 8–23)
CO2: 19 mmol/L — ABNORMAL LOW (ref 22–32)
Calcium: 6.7 mg/dL — ABNORMAL LOW (ref 8.9–10.3)
Chloride: 112 mmol/L — ABNORMAL HIGH (ref 98–111)
Creatinine, Ser: 0.47 mg/dL (ref 0.44–1.00)
GFR calc Af Amer: >60 mL/min (ref >60)
GFR calc non Af Amer: >60 mL/min (ref >60)
Glucose, Bld: 105 mg/dL — ABNORMAL HIGH (ref 70–99)
Potassium: 3.7 mmol/L (ref 3.5–5.1)
Sodium: 133 mmol/L — ABNORMAL LOW (ref 135–145)

## 2019-12-21 LAB — CBC
HCT: 31.8 % — ABNORMAL LOW (ref 36.0–46.0)
Hemoglobin: 10.2 g/dL — ABNORMAL LOW (ref 12.0–15.0)
MCH: 30.6 pg (ref 26.0–34.0)
MCHC: 32.1 g/dL (ref 30.0–36.0)
MCV: 95.5 fL (ref 80.0–100.0)
Platelets: 180 10*3/uL (ref 150–400)
RBC: 3.33 MIL/uL — ABNORMAL LOW (ref 3.87–5.11)
RDW: 15.8 % — ABNORMAL HIGH (ref 11.5–15.5)
WBC: 4.3 10*3/uL (ref 4.0–10.5)
nRBC: 0 % (ref 0.0–0.2)

## 2019-12-21 LAB — TYPE AND SCREEN
ABO/RH(D): O POS
Antibody Screen: POSITIVE
Donor AG Type: NEGATIVE
Donor AG Type: NEGATIVE
PT AG Type: NEGATIVE
Unit division: 0
Unit division: 0

## 2019-12-21 LAB — BPAM RBC
Blood Product Expiration Date: 202104252359
Blood Product Expiration Date: 202105032359
ISSUE DATE / TIME: 202103311353
Unit Type and Rh: 5100
Unit Type and Rh: 5100

## 2019-12-21 LAB — MAGNESIUM: Magnesium: 1.8 mg/dL (ref 1.7–2.4)

## 2019-12-21 LAB — PHOSPHORUS: Phosphorus: 1.5 mg/dL — ABNORMAL LOW (ref 2.5–4.6)

## 2019-12-21 MED ORDER — K PHOS MONO-SOD PHOS DI & MONO 155-852-130 MG PO TABS: 250.0000 mg | ORAL_TABLET | Freq: Every day | ORAL | Status: DC

## 2019-12-21 MED ORDER — K PHOS MONO-SOD PHOS DI & MONO 155-852-130 MG PO TABS
250.0000 mg | ORAL_TABLET | Freq: Three times a day (TID) | ORAL | Status: DC
  Administered 2019-12-21 – 2020-01-15 (×74): 250 mg via ORAL
  Filled 2019-12-21 (×77): qty 1

## 2019-12-21 MED ORDER — K PHOS MONO-SOD PHOS DI & MONO 155-852-130 MG PO TABS
250.0000 mg | ORAL_TABLET | Freq: Three times a day (TID) | ORAL | Status: DC
  Administered 2019-12-21: 250 mg via ORAL
  Filled 2019-12-21: qty 1

## 2019-12-21 NOTE — Progress Notes (Signed)
Family Medicine Teaching Service Daily Progress Note Intern Pager: [phone]  Patient name: Deanna Cunningham Medical record number: [id number] Date of birth: 1945/10/02 Age: 74 y.o. Gender: female  Primary Care Provider: Kristie Cowman, MD Consultants: None, consider reaching out to patient's orthopedist Code Status: Full code  Pt Overview and Major Events to Date:  3/26-patient admitted for weakness, malnutrition   Assessment and Plan: Deanna Cunningham is a 74 y.o. female presenting with weakness. PMH is significant for Rheumatoid Arthritis (no tx, no rheumatologist), chronic anemia and GERD.  Underweight, severe protein calorie malnutrition: Improving, stable. Had 4 large yellow BMs from soft to loose and vomited x3 thin yellow emesis with food in it. She stated she had cobbler for dinner and should not have eaten it. Pt was given compazine per prn order; however,she was unable to keep down her potassium dose that was ordered. She is also lactose intolerant and states she did not eat the crust of the peach cobbler. -Dietitian on board, would appreciate continued caloric counting to meet appropriate needs -Continue MVI, 30 mL prostat TID, and dysphagia 3 diet per nutrition -GI on board, appreciate recommendations thus far, to see again on 4/5  -Daily weights, strict I&O (weight uptrending) -Work with social work/PT/OT for disposition  Esophageal dysmotility: Stable. Evident on 3/31 barium swallow.  Tolerating dysphagia diet well until last night see above. -Continue to monitor feeding -Monitor mag, Phos due to refeeding syndrome and replete as appropriate  Normocytic, hypochromic anemia: Chronic, currently stable. Last Hgb 8.8, around baseline. S/p 1 U RBCs total during admit. Multifactorial, in setting of likely anemia of chronic disease, severe malnutrition.  Previous concern for myelodysplastic syndrome in 2014, oncology recommending follow-up outpatient for further evaluation with  bone marrow biopsy if not improving with malnutrition correction. -Heme/onc consulted and appreciate recommendations, will follow outpatient -f/u AM CBC  GERD: Chronic, improved. Minimal symptoms on Pepcid and Protonix. -Famotidine 70m BID -Protonix 40 mg -Sucralfate with meals/bedtime  Oral candidiasis: Resolved. -Magic mouthwash    Sacral wound: stable.  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 Prolonged QTC 509 on 3/31.  Follows with [facility], Dr. SGardiner Rhyme outpatient.  -Avoid QT prolonging medications  Rheumatoid arthritis Ulnar deviation of MCP joints bilaterally. Does not have Rheumatologist and is not on DMARDs. -Monitor pain   -Ambulatory referral to Rheum on discharge   FEN/GI: Dysphagia 3, replete electrolytes as appropriate Prophylaxis: Encourage ambulation, SCDs   Disposition: Continued medical management; [place]  Subjective:  She is feeling better than last night.  Objective: Temp:  [98 F (36.7 C)-98.6 F (37 C)] 98.5 F (36.9 C) (04/04 0310) Pulse Rate:  [64-98] 64 (04/04 0310) Resp:  [14-18] 14 (04/04 0310) BP: (100-107)/(51-72) 103/57 (04/04 0310) SpO2:  [84 %-100 %] 84 % (04/04 0310)   Physical Exam:  General: Severely cachetic ill appearing, no acute distress. Age appropriate. Cardiac: Irregular rate and rhythem, no murmurs Respiratory: CTAB, normal effort Abdomen: soft, nontender, nondistended, increased bowel sounds Extremities: LE pitting edema. Cold LE w/o cyanosis.  Skin: LE venous stasis ulcers Neuro: alert and oriented, no focal deficits  Laboratory: Recent Labs  Lab 12/17/19 0507 12/17/19 0507 12/17/19 2021 12/18/19 0155 12/19/19 0437  WBC 3.7*  --   --  4.0 3.4*  HGB 6.8*   < > 9.6* 8.8* 8.8*  HCT 20.6*   < >  28.0* 26.4* 26.7*  PLT 166  --   --  162 179   < > = values  in this interval not displayed.   Recent Labs  Lab 12/19/19 0437 12/20/19 0332 12/21/19 0347  NA 130* 133* 133*  K 4.2 4.1 3.7  CL 105 108 112*  CO2 21* 21* 19*  BUN 16 16 21   CREATININE 0.50 0.51 0.47  CALCIUM 6.8* 6.7* 6.7*  PROT 4.6*  --   --   BILITOT 0.1*  --   --   ALKPHOS 77  --   --   ALT 19  --   --   AST 15  --   --   GLUCOSE 79 84 105*    Imaging/Diagnostic Tests: No new imaging  Gerlene Fee, DO 12/21/2019, 7:48 AM PGY-1, [place] Intern pager: [phone], text pages welcome

## 2019-12-21 NOTE — Progress Notes (Signed)
Notified Dr Pilar Plate that pt has had 4 large yellow BMs from soft to loose and vomited x3 thin yellow emesis with food in it. She stated she had cobbler for dinner and should not have eaten it. Pt was given compazine per prn order; however,she was unable to keep down her potassium dose that was ordered. RN will continue to monitor.

## 2019-12-21 NOTE — Plan of Care (Signed)
  Problem: Pain Managment: Goal: General experience of comfort will improve Outcome: Progressing   Problem: Safety: Goal: Ability to remain free from injury will improve Outcome: Progressing   Problem: Skin Integrity: Goal: Risk for impaired skin integrity will decrease Outcome: Progressing   

## 2019-12-22 DIAGNOSIS — R531 Weakness: Secondary | ICD-10-CM | POA: Diagnosis not present

## 2019-12-22 DIAGNOSIS — D649 Anemia, unspecified: Secondary | ICD-10-CM | POA: Diagnosis not present

## 2019-12-22 DIAGNOSIS — D709 Neutropenia, unspecified: Secondary | ICD-10-CM

## 2019-12-22 DIAGNOSIS — E876 Hypokalemia: Secondary | ICD-10-CM | POA: Diagnosis not present

## 2019-12-22 DIAGNOSIS — R229 Localized swelling, mass and lump, unspecified: Secondary | ICD-10-CM | POA: Diagnosis not present

## 2019-12-22 DIAGNOSIS — E43 Unspecified severe protein-calorie malnutrition: Secondary | ICD-10-CM | POA: Diagnosis not present

## 2019-12-22 LAB — DIFFERENTIAL
Abs Immature Granulocytes: 0.03 10*3/uL (ref 0.00–0.07)
Basophils Absolute: 0 10*3/uL (ref 0.0–0.1)
Basophils Relative: 1 %
Eosinophils Absolute: 0 10*3/uL (ref 0.0–0.5)
Eosinophils Relative: 1 %
Immature Granulocytes: 1 %
Lymphocytes Relative: 17 %
Lymphs Abs: 0.6 10*3/uL — ABNORMAL LOW (ref 0.7–4.0)
Monocytes Absolute: 0.3 10*3/uL (ref 0.1–1.0)
Monocytes Relative: 8 %
Neutro Abs: 2.5 10*3/uL (ref 1.7–7.7)
Neutrophils Relative %: 72 %

## 2019-12-22 LAB — BASIC METABOLIC PANEL
Anion gap: 3 — ABNORMAL LOW (ref 5–15)
BUN: 25 mg/dL — ABNORMAL HIGH (ref 8–23)
CO2: 19 mmol/L — ABNORMAL LOW (ref 22–32)
Calcium: 6.8 mg/dL — ABNORMAL LOW (ref 8.9–10.3)
Chloride: 105 mmol/L (ref 98–111)
Creatinine, Ser: 0.44 mg/dL (ref 0.44–1.00)
GFR calc Af Amer: >60 mL/min (ref >60)
GFR calc non Af Amer: >60 mL/min (ref >60)
Glucose, Bld: 99 mg/dL (ref 70–99)
Potassium: 4.9 mmol/L (ref 3.5–5.1)
Sodium: 127 mmol/L — ABNORMAL LOW (ref 135–145)

## 2019-12-22 LAB — COMPREHENSIVE METABOLIC PANEL
ALT: 14 U/L (ref 0–44)
AST: 12 U/L — ABNORMAL LOW (ref 15–41)
Albumin: <1 g/dL — ABNORMAL LOW (ref 3.5–5.0)
Alkaline Phosphatase: 52 U/L (ref 38–126)
Anion gap: 3 — ABNORMAL LOW (ref 5–15)
BUN: 23 mg/dL (ref 8–23)
CO2: 18 mmol/L — ABNORMAL LOW (ref 22–32)
Calcium: 5.3 mg/dL — CL (ref 8.9–10.3)
Chloride: 117 mmol/L — ABNORMAL HIGH (ref 98–111)
Creatinine, Ser: 0.36 mg/dL — ABNORMAL LOW (ref 0.44–1.00)
GFR calc Af Amer: >60 mL/min (ref >60)
GFR calc non Af Amer: >60 mL/min (ref >60)
Glucose, Bld: 63 mg/dL — ABNORMAL LOW (ref 70–99)
Potassium: 2.9 mmol/L — ABNORMAL LOW (ref 3.5–5.1)
Sodium: 138 mmol/L (ref 135–145)
Total Bilirubin: 0.2 mg/dL — ABNORMAL LOW (ref 0.3–1.2)
Total Protein: 3.2 g/dL — ABNORMAL LOW (ref 6.5–8.1)

## 2019-12-22 LAB — GLUCOSE, CAPILLARY
Glucose-Capillary: 35 mg/dL — CL (ref 70–99)
Glucose-Capillary: 49 mg/dL — ABNORMAL LOW (ref 70–99)
Glucose-Capillary: 61 mg/dL — ABNORMAL LOW (ref 70–99)
Glucose-Capillary: 80 mg/dL (ref 70–99)

## 2019-12-22 LAB — CBC
HCT: 24.8 % — ABNORMAL LOW (ref 36.0–46.0)
Hemoglobin: 8 g/dL — ABNORMAL LOW (ref 12.0–15.0)
MCH: 31 pg (ref 26.0–34.0)
MCHC: 32.3 g/dL (ref 30.0–36.0)
MCV: 96.1 fL (ref 80.0–100.0)
Platelets: 130 10*3/uL — ABNORMAL LOW (ref 150–400)
RBC: 2.58 MIL/uL — ABNORMAL LOW (ref 3.87–5.11)
RDW: 15.8 % — ABNORMAL HIGH (ref 11.5–15.5)
WBC: 3.4 10*3/uL — ABNORMAL LOW (ref 4.0–10.5)
nRBC: 0 % (ref 0.0–0.2)

## 2019-12-22 LAB — PHOSPHORUS
Phosphorus: 1.5 mg/dL — ABNORMAL LOW (ref 2.5–4.6)
Phosphorus: 2.9 mg/dL (ref 2.5–4.6)

## 2019-12-22 LAB — MAGNESIUM
Magnesium: 1.3 mg/dL — ABNORMAL LOW (ref 1.7–2.4)
Magnesium: 2.2 mg/dL (ref 1.7–2.4)

## 2019-12-22 LAB — TRIGLYCERIDES: Triglycerides: 87 mg/dL (ref <150)

## 2019-12-22 LAB — PREALBUMIN: Prealbumin: 5 mg/dL — ABNORMAL LOW (ref 18–38)

## 2019-12-22 MED ORDER — POTASSIUM CHLORIDE 10 MEQ/50ML IV SOLN: 10.0000 meq | INTRAVENOUS | Status: DC

## 2019-12-22 MED ORDER — PRO-STAT SUGAR FREE PO LIQD
30.0000 mL | Freq: Three times a day (TID) | ORAL | Status: DC
  Administered 2019-12-22 – 2019-12-30 (×22): 30 mL via ORAL
  Filled 2019-12-22 (×18): qty 30

## 2019-12-22 MED ORDER — INSULIN ASPART 100 UNIT/ML ~~LOC~~ SOLN
0.0000 [IU] | Freq: Four times a day (QID) | SUBCUTANEOUS | Status: DC
  Administered 2019-12-23 – 2019-12-25 (×4): 1 [IU] via SUBCUTANEOUS

## 2019-12-22 MED ORDER — TRAVASOL 10 % IV SOLN
INTRAVENOUS | Status: AC
  Filled 2019-12-22: qty 230.4

## 2019-12-22 MED ORDER — DEXTROSE 50 % IV SOLN
12.5000 g | INTRAVENOUS | Status: AC
  Administered 2019-12-22: 12.5 g via INTRAVENOUS

## 2019-12-22 MED ORDER — CALCIUM GLUCONATE-NACL 2-0.675 GM/100ML-% IV SOLN
2.0000 g | Freq: Once | INTRAVENOUS | Status: AC
  Administered 2019-12-22: 2000 mg via INTRAVENOUS
  Filled 2019-12-22: qty 100

## 2019-12-22 MED ORDER — DEXTROSE 50 % IV SOLN
INTRAVENOUS | Status: AC
  Filled 2019-12-22: qty 50

## 2019-12-22 MED ORDER — MAGNESIUM SULFATE 4 GM/100ML IV SOLN
4.0000 g | Freq: Once | INTRAVENOUS | Status: AC
  Administered 2019-12-22: 4 g via INTRAVENOUS
  Filled 2019-12-22: qty 100

## 2019-12-22 MED ORDER — POTASSIUM PHOSPHATES 15 MMOLE/5ML IV SOLN
30.0000 mmol | Freq: Once | INTRAVENOUS | Status: AC
  Administered 2019-12-22: 30 mmol via INTRAVENOUS
  Filled 2019-12-22: qty 10

## 2019-12-22 MED ORDER — ENSURE ENLIVE PO LIQD
237.0000 mL | Freq: Two times a day (BID) | ORAL | Status: DC
  Administered 2019-12-22: 237 mL via ORAL

## 2019-12-22 MED ORDER — BOOST / RESOURCE BREEZE PO LIQD CUSTOM
1.0000 | Freq: Two times a day (BID) | ORAL | Status: DC
  Administered 2019-12-22 – 2019-12-23 (×3): 1 via ORAL

## 2019-12-22 MED ORDER — POTASSIUM CHLORIDE 10 MEQ/100ML IV SOLN
10.0000 meq | INTRAVENOUS | Status: AC
  Administered 2019-12-22 (×5): 10 meq via INTRAVENOUS
  Filled 2019-12-22 (×6): qty 100

## 2019-12-22 NOTE — Progress Notes (Signed)
Physical Therapy Treatment Patient Details Name: Deanna Cunningham MRN: [id number] DOB: 08-12-1946 Today's Date: 12/22/2019    History of Present Illness Pt is a 74 y.o. female admitted on 12/12/19 for progressive weakness, poor po intake, bil LE swelling.  Pt dx with severe protein calorie malnutrition (concern for malignancy), medical workup in progress.  Pt also dx with anemia acute on chronic, hypokalemia, hypomagnesemia, stage 1 sacral wound, QT prolongation, and hypocalcemia.  Pt with significant PMH of dysphagia (s/p EGD with esophageal dialation), CKD III, anemia, RA.      PT Comments    The patient reports feeling better and stronger. Patient ambulated x 80 using RW. Noted decreased dorsiflexion  And swing LLE, appears to have more edema versus right foot. Continue  Progressive mobility.  HR 109-115 during ambulation  Follow Up Recommendations  Home health PT;Supervision/Assistance - 24 hour     Equipment Recommendations  3in1 (PT)    Recommendations for Other Services       Precautions / Restrictions Precautions Precautions: Fall Precaution Comments: incontinent    Mobility  Bed Mobility   Bed Mobility: Supine to Sit     Supine to sit: Min guard;HOB elevated     General bed mobility comments: increased time and effort, required no assistanc  to sit up  Transfers Overall transfer level: Needs assistance Equipment used: Rolling walker (2 wheeled) Transfers: Sit to/from Stand Sit to Stand: Mod assist         General transfer comment: mod assist to power up, bed raised,  Ambulation/Gait Ambulation/Gait assistance: Min guard;Min assist Gait Distance (Feet): 80 Feet Assistive device: Rolling walker (2 wheeled) Gait Pattern/deviations: Step-through pattern;Decreased stride length;Trunk flexed;Decreased dorsiflexion - left     General Gait Details: noted  left foot with slight drag, decereased dorsifkexion.   Stairs             Wheelchair Mobility     Modified Rankin (Stroke Patients Only)       Balance                                            Cognition   Behavior During Therapy: WFL for tasks assessed/performed                                          Exercises General Exercises - Lower Extremity Ankle Circles/Pumps: AROM;Both;10 reps;Supine Long Arc Quad: AROM;Both;Seated;10 reps Hip ABduction/ADduction: AROM;Both;10 reps;Seated Hip Flexion/Marching: AAROM;Both;Seated;10 reps    General Comments        Pertinent Vitals/Pain Pain Assessment: No/denies pain    Home Living                      Prior Function            PT Goals (current goals can now be found in the care plan section) Progress towards PT goals: Progressing toward goals    Frequency    Min 3X/week      PT Plan Current plan remains appropriate;Discharge plan needs to be updated    Co-evaluation              AM-PAC PT "6 Clicks" Mobility   Outcome Measure  Help needed turning from your back to your side while in a flat bed  without using bedrails?: A Little Help needed moving from lying on your back to sitting on the side of a flat bed without using bedrails?: A Little Help needed moving to and from a bed to a chair (including a wheelchair)?: A Little Help needed standing up from a chair using your arms (e.g., wheelchair or bedside chair)?: A Little Help needed to walk in hospital room?: A Little Help needed climbing 3-5 steps with a railing? : A Lot 6 Click Score: 17    End of Session Equipment Utilized During Treatment: Gait belt Activity Tolerance: Patient tolerated treatment well Patient left: in chair;with call bell/phone within reach;with chair alarm set Nurse Communication: Mobility status PT Visit Diagnosis: Other abnormalities of gait and mobility (R26.89);Muscle weakness (generalized) (M62.81);Difficulty in walking, not elsewhere classified (R26.2)     Time:  [id number] PT Time Calculation (min) (ACUTE ONLY): 36 min  Charges:  $Gait Training: 8-22 mins $Therapeutic Exercise: 8-22 mins                     Tresa Endo PT Acute Rehabilitation Services Pager [phone] Office [phone]    Claretha Cooper 12/22/2019, 1:05 PM

## 2019-12-22 NOTE — Progress Notes (Signed)
Subjective: She reports feeling better, but she is not able to tolerate PO beyond liquids.  Objective: Vital signs in last 24 hours: Temp:  [97.4 F (36.3 C)-98.7 F (37.1 C)] 97.4 F (36.3 C) (04/05 0755) Pulse Rate:  [95-100] 95 (04/05 0755) Resp:  [16-18] 17 (04/05 0755) BP: (87-97)/(53-65) 92/60 (04/05 0755) SpO2:  [90 %-100 %] 99 % (04/05 0755) Weight:  [44.6 kg] 44.6 kg (04/05 0600) Last BM Date: 12/21/19  Intake/Output from previous day: 04/04 0701 - 04/05 0700 In: 360 [P.O.:360] Out: 5 [Urine:3; Stool:2] Intake/Output this shift: No intake/output data recorded.  General appearance: alert and cachectic GI: soft, non-tender; bowel sounds normal; no masses,  no organomegaly  Lab Results: Recent Labs    12/21/19 0347 12/22/19 0412  WBC 4.3 3.4*  HGB 10.2* 8.0*  HCT 31.8* 24.8*  PLT 180 130*   BMET Recent Labs    12/20/19 0332 12/21/19 0347 12/22/19 0412  NA 133* 133* 138  K 4.1 3.7 2.9*  CL 108 112* 117*  CO2 21* 19* 18*  GLUCOSE 84 105* 63*  BUN 16 21 23   CREATININE 0.51 0.47 0.36*  CALCIUM 6.7* 6.7* 5.3*   LFT Recent Labs    12/22/19 0412  PROT 3.2*  ALBUMIN <1.0*  AST 12*  ALT 14  ALKPHOS 52  BILITOT 0.2*   PT/INR No results for input(s): LABPROT, INR in the last 72 hours. Hepatitis Panel No results for input(s): HEPBSAG, HCVAB, HEPAIGM, HEPBIGM in the last 72 hours. C-Diff No results for input(s): CDIFFTOX in the last 72 hours. Fecal Lactopherrin No results for input(s): FECLLACTOFRN in the last 72 hours.  Studies/Results: No results found.  Medications:  Scheduled: . sodium chloride   Intravenous Once  . Chlorhexidine Gluconate Cloth  6 each Topical Daily  . famotidine  20 mg Oral BID  . feeding supplement (PRO-STAT SUGAR FREE 64)  30 mL Oral TID BM  . mouth rinse  15 mL Mouth Rinse BID  . multivitamin with minerals  1 tablet Oral Daily  . pantoprazole  40 mg Oral Daily  . phosphorus  250 mg Oral TID  . sodium chloride flush   10-40 mL Intracatheter Q12H  . sucralfate  1 g Oral TID WC & HS   Continuous: . calcium gluconate    . magnesium sulfate bolus IVPB 4 g (12/22/19 0824)  . potassium chloride    . potassium PHOSPHATE IVPB (in mmol) 30 mmol (12/22/19 [id number])    Assessment/Plan: 1) Severe malnutrition. 2) ? Distal esophageal stricture. 3) Nausea and vomiting.   The patient reports that she is not able to tolerate anything beyond liquids. She tried eating peach cobbler this weekend, but she vomited the food afterwards.  In her estimation, she feels as if solid foods stop in the lower esophagus.  The esophagram showed a distal esophageal narrowing, but it was difficult to assess if the barium tablet would pass through the area.  There was a significant amount of dysmotility with her esophagus.  Per Dietary the patient was able to meet her caloric intake goals 75-100%, but she states that she cannot tolerate any solid foods.  I am concerned that she will not be able to truly meet her caloric requirements.  TPN was placed on hold as she was doing well orally.  I agree that TPN should not be used if she has a functional bowel, but I have my doubts that she can tolerate PO.  An EGD with dilation can be pursued, but  she feels as if she is too weak for the procedure currently.  She feels that this procedure may allow her to eat solids and to avert any nausea/vomiting.  Plan: 1) Continue calorie count. 2) Daily reassessment of the patient's personal strength estimation to undergo an EGD with dilation.  LOS: 10 days   Nawal Burling D 12/22/2019, 8:26 AM

## 2019-12-22 NOTE — Progress Notes (Signed)
PHARMACY - TOTAL PARENTERAL NUTRITION CONSULT NOTE   Indication: malnutrition  Patient Measurements: Height: 5\' 4"  (162.6 cm) Weight: 44.6 kg (98 lb 5.2 oz) IBW/kg (Calculated) : 54.7 TPN AdjBW (KG): 42.2 Body mass index is 16.88 kg/m.  Assessment: 74 yo female initially presented on 3/26 for weakness. Patient with PMH of RA, chronic anemia and GERD  She has had ongoing poor intake since Dec 2020 with LE swelling and unintentional weight loss. W/U does not seem to find anything significant thus far, but continues to have inadequate intake  48 hour calorie count 4/3 - 4/5: inconsistent intake, not meeting goals  Glucose / Insulin: No SSI needed, Glu < 100s - noted 63 this am Electrolytes:  K 2.9 Phos 1.5, Mg 1.3 - replaced this am Renal: SCr 0.36 LFTs / TGs: Trigs 87, LFTs WNL Prealbumin / albumin: Prealbumin 5, Albumin < 1 Intake / Output; MIVF: 4 stools, 3 urine occurrences (amount not charted) GI Imaging: 3/31 - barium swallow - unable to pass tablet - study shows esophageal dysmotility, dilation, and narrowing   4/1 CT abd pelvis - anasarca and ascites; likely underlying liver disease  Surgeries / Procedures: see above  Central access: PICC 4/1 TPN start date: 12/22/2019  Nutritional Goals (per RD recommendation on 4/2): kCal: 1600 - 1800 / day, Protein: 85 - 100 g/day, Fluid: > 1.6 L Goal TPN rate is 70 mL/hr (provides 55 g of protein and 1400 kcals per day) since pt is regularly taking the 3 prostat ordered daily  Current Nutrition:  TPN Dysphagia 3 - but really only taking in some liquids (solid foods cause vomiting) Prostat TID - 100 cal, 15 g protein, 10 g carbs (each)  Plan:  Start TPN at 30 mL/hr at 1800 - advance as tolerated  Electrolytes in TPN: 77mEq/L of Na, 30mEq/L of K, 88mEq/L of Ca, 74mEq/L of Mg, and 73mmol/L of Phos. Cl:Ac ratio 1:1 - will leave as standard as significant replacement done this am Add standard MVI MWF and trace elements to TPN Initiate  Sensitive q6h SSI and adjust as needed  Monitor TPN labs on Mon/Thurs and in am tomorrow  Barth Kirks, PharmD, BCPS, BCCCP Clinical Pharmacist [phone]  Please check AMION for all [place] numbers  12/22/2019 11:13 AM

## 2019-12-22 NOTE — Progress Notes (Signed)
Dr. Rosita Fire notified of patient's blood sugar 35 then 49, patient has started on TPN, drank some ensure.  Wants patient to be rechecked @ 2000 and call with results.

## 2019-12-22 NOTE — Progress Notes (Addendum)
CRITICAL VALUE ALERT  Critical Value:  5.3 Calcium    Date & Time Notied:  12/22/19   Provider Notified: Erin Hearing, paged  Orders Received/Actions taken:, call FM teaching service.   Paged @ [phone], Dr. Kris Mouton called back.  No new orders received at this time.

## 2019-12-22 NOTE — Progress Notes (Addendum)
Family Medicine Teaching Service Daily Progress Note Intern Pager: [phone]  Patient name: Deanna Cunningham Medical record number: [id number] Date of birth: 05/10/1946 Age: 74 y.o. Gender: female  Primary Care Provider: Kristie Cowman, MD Consultants: None, consider reaching out to patient's orthopedist Code Status: Full code  Pt Overview and Major Events to Date:  3/26-patient admitted for weakness, malnutrition   Assessment and Plan: Deanna Cunningham is a 74 y.o. female presenting with weakness. PMH is significant for Rheumatoid Arthritis (no tx, no rheumatologist), chronic anemia and GERD.  Underweight, severe protein calorie malnutrition: Improving, stable. Unable to tolerate solid food but feels as if liquids are tolerable. Starting TPN today.  -Dietitian on board, would appreciate continued caloric counting to meet appropriate needs -Continue MVI, 30 mL prostat TID, and dysphagia 3 diet per nutrition -GI on board, appreciate recommendations: EGD for dilatation of stricture when patient regains strength for procedure -Conversation with GI and bother to possibly take on service as primary team. -Daily weights, strict I&O (weight uptrending) -Work with social work/PT/OT for disposition  Esophageal dysmotility: Stable. Evident on 3/31 barium swallow. Unable to tolerated dysphagia diet. Will continue with FLD for now and TPN this evening -Continue to monitor feeding -Monitor mag, Phos due to refeeding syndrome and replete as appropriate  Normocytic, hypochromic anemia: Chronic, currently stable. Last Hgb 8.0, around baseline. S/p 1 U RBCs total during admit.  -Heme/onc consulted and appreciate recommendations, will follow outpatient -f/u AM CBC  GERD: Chronic, improved. Minimal symptoms on Pepcid and Protonix. -Famotidine 20mg  BID -Protonix 40 mg -Sucralfate with meals/bedtime  Oral candidiasis: Resolved. -Magic mouthwash    Sacral wound: stable.  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 Prolonged QTC 509 on 3/31.  Follows with [facility], Dr. Gardiner Rhyme, outpatient.  -Avoid QT prolonging medications  Rheumatoid arthritis Ulnar deviation of MCP joints bilaterally. Does not have Rheumatologist and is not on DMARDs. -Monitor pain   -Ambulatory referral to Rheum on discharge   FEN/GI: Dysphagia 3, replete electrolytes as appropriate Prophylaxis: Encourage ambulation, SCDs   Disposition: SNF pending continued medical management  Subjective:  She feels better today than over the weekend.  Objective: Temp:  [97.5 F (36.4 C)-98.7 F (37.1 C)] 98.7 F (37.1 C) (04/05 0321) Pulse Rate:  [72-100] 96 (04/05 0321) Resp:  [16-18] 16 (04/05 0321) BP: (87-97)/(48-65) 97/53 (04/05 0321) SpO2:  [90 %-100 %] 100 % (04/05 0321) Weight:  [44.6 kg] 44.6 kg (04/05 0600)   Physical Exam:  General: Appears unwell, very cachetic, no acute distress. Age appropriate. Cardiac: RRR, faint heart sounds, no murmurs Respiratory: CTAB, normal effort Abdomen: soft, nontender, nondistended, +BS  Laboratory: Recent Labs  Lab 12/19/19 0437 12/21/19 0347 12/22/19 0412  WBC 3.4* 4.3 3.4*  HGB 8.8* 10.2* 8.0*  HCT 26.7* 31.8* 24.8*  PLT 179 180 130*   Recent Labs  Lab 12/19/19 0437 12/20/19 0332 12/21/19 0347  NA 130* 133* 133*  K 4.2 4.1 3.7  CL 105 108 112*  CO2 21* 21* 19*  BUN 16 16 21   CREATININE 0.50 0.51 0.47  CALCIUM 6.8* 6.7* 6.7*  PROT 4.6*  --   --   BILITOT 0.1*  --   --   ALKPHOS 77  --   --   ALT 19  --   --   AST 15  --   --  GLUCOSE 79 84 105*    Imaging/Diagnostic Tests: No new imaging  Gerlene Fee, DO 12/22/2019, 7:04 AM PGY-1, [place] Intern pager: [phone], text pages welcome

## 2019-12-22 NOTE — Progress Notes (Signed)
Nutrition Follow-up  RD working remotely.  DOCUMENTATION CODES:   Underweight, Severe malnutrition in context of chronic illness  INTERVENTION:   -Continue MVI with minerals daily -Continue 30 ml Prostat TID, each supplement provides 100 kcals and 15 grams protein -Continue with dysphagia 3 diet -Boost Breeze po BID, each supplement provides 250 kcal and 9 grams of protein -Ensure Enlive po BID, each supplement provides 350 kcal and 20 grams of protein -Downgrade diet to dysphagia 1, per request -TPN management per pharmacy  NUTRITION DIAGNOSIS:   Severe Malnutrition related to decreased appetite as evidenced by energy intake < or equal to 75% for > or equal to 1 month, severe fat depletion, moderate muscle depletion, severe muscle depletion.  Ongoing  GOAL:   Patient will meet greater than or equal to 90% of their needs  Progressing   MONITOR:   PO intake, Supplement acceptance, Diet advancement, Labs, Weight trends, Skin, I & O's  REASON FOR ASSESSMENT:   Consult Calorie Count  ASSESSMENT:   Deanna Cunningham is a 74 y.o. female presenting with weakness. PMH is significant for Rheumatoid Arthritis (no tx, no rheumatologist), chronic anemia and GERD.  4/2- PICC placed  Reviewed I/O's: +595 ml x 24 hours and +6.2 L since admission  UOP: 3 ml x 24 hours  Case discussed with RN, who reports pt's brother is requesting an update from RD.   Spoke with pt via room phone, who reports her appetite is "okay". She is apprehensive to trying solid foods after having nausea and vomiting yesterday. Per pt, she consumed two bowls of broth and would like to try other nutritional supplements as well. Pt thinks she can try Ensure (lactose free) and Boost Breeze. Of note, pt had poor tolerance of these supplements last week, however, pt thinks she can handle them now. She would also like to continue the Prostat supplement.   Calorie count was ordered over the weekend- pt consumed  only 41% of estimated needs yesterday, as she consumed mostly clear liquids (noted pt consumed >100% prior to nausea and vomiting episodes).   Called and update pt's brother (Dr. Marijo Conception) per his request. He explains that pt has experienced esophageal dysmotility over the 6 years and has had difficulty tolerating solid foods secondary to esophageal stricture. He feels pt would be best served on a pureed diet with liquids. Reviewed calorie count results and supplements ordered. Pt will start TPN today and he is hopeful this will "jump start" her nutritional status.   Labs reviewed.   Diet Order:   Diet Order            DIET - DYS 1 Room service appropriate? Yes with Assist; Fluid consistency: Thin  Diet effective now              EDUCATION NEEDS:   Education needs have been addressed  Skin:  Skin Assessment: Reviewed RN Assessment  Last BM:  12/21/19  Height:   Ht Readings from Last 1 Encounters:  12/13/19 5\' 4"  (1.626 m)    Weight:   Wt Readings from Last 1 Encounters:  12/22/19 44.6 kg    Ideal Body Weight:  54.5 kg  BMI:  Body mass index is 16.88 kg/m.  Estimated Nutritional Needs:   Kcal:  [id number]  Protein:  90-105 grams  Fluid:  > 1.7 L    [name], RD, LDN, [place] Registered Dietitian II Certified Diabetes Care and Education Specialist Please refer to [facility] for RD and/or RD on-call/weekend/after hours pager

## 2019-12-22 NOTE — Progress Notes (Addendum)
CBG was 61 at 2003. Hypoglycemia protocol initiated. Patient voicing no complaints. States "I feel like I always do". Pt drank 1 container of Boost , berry flavored and tolerated it well. Patient given 12.5 mg IV dextrose per protocol at 2040. CBG will be rechecked and RN will continue to monitor pt.      2111- CBG 80  2300 Pt refused scheduled Ensure . Stated: "it hurt my stomach". Pt  drank another container of Boost- orange flavored.RN will continue to monitor pt.   Notifed Dr Volanda Napoleon of patient status. RN will continue to monitor.

## 2019-12-22 NOTE — Progress Notes (Signed)
Calorie Count Note  RD working remotely.  48 hour calorie count ordered.  Diet: dysphagia 3 Supplements: 30 ml Prostat TID, each supplement provides 100 kcals and 15 grams protein; MVI with minerals daily  Per chart review, pt feeling discouraged because of vomiting episode yesterday. She is apprehensive to eat because of this, but has been ordering clear liquids such as broth over the past 24 hours.   12/20/19 Breakfast: 1012 kcals, 28 grams protein Lunch: 607 kcals, 13 grams protein Dinner: nothing documented, as pt did not order meal Supplements: 3 Prostat supplements (300 kcals, 45 grams protein)  Total intake: 1919 kcal (>100% of minimum estimated needs)  81 grams protein (95% of minimum estimated needs)  12/21/19 Breakfast: 334 kcals, 14 grams protein Lunch: 14 kcals, 1 gram protein Dinner: 11 kcals, 0 grams protein Supplements: 3 Prostat supplements (300 kcals, 45 grams protein)  Total intake: 659 kcal (41% of minimum estimated needs)  60 grams protein (71% of minimum estimated needs)  AverageTotal intake: 1289 kcal (81% of minimum estimated needs)  71 grams protein (84% of minimum estimated needs)  Nutrition Dx: Severe Malnutrition related to decreased appetite as evidenced by energy intake < or equal to 75% for > or equal to 1 month, severe fat depletion, moderate muscle depletion, severe muscle depletion; ongoing  Goal: Patient will meet greater than or equal to 90% of their needs; progressing   Intervention:   -D/c calorie count -Continue 30 ml Prostat TID, each supplement provides 100 kcals and 15 grams protein -Continue MVI with minerals dailt  Deanna Cunningham, RD, LDN, [place] Registered Dietitian II Certified Diabetes Care and Education Specialist Please refer to AMION for RD and/or RD on-call/weekend/after hours pager

## 2019-12-22 NOTE — Progress Notes (Addendum)
IP PROGRESS NOTE  Subjective:   No complaint.  She appears unchanged. Unable to tolerate PO except for liquids. Due to begin TPN later today.   Objective: Vital signs in last 24 hours: Blood pressure 92/60, pulse 95, temperature (!) 97.4 F (36.3 C), temperature source Oral, resp. rate 17, height 5\' 4"  (1.626 m), weight 44.6 kg, SpO2 99 %.  Intake/Output from previous day: 04/04 0701 - 04/05 0700 In: 600 [P.O.:600] Out: 5 [Urine:3; Stool:2]  Physical Exam:  General: cachectic, no distress Neuro: alert and oriented  Lab Results: Recent Labs    12/21/19 0347 12/22/19 0412  WBC 4.3 3.4*  HGB 10.2* 8.0*  HCT 31.8* 24.8*  PLT 180 130*   DAT: Negative, reticulocyte count 1.3% BMET Recent Labs    12/21/19 0347 12/22/19 0412  NA 133* 138  K 3.7 2.9*  CL 112* 117*  CO2 19* 18*  GLUCOSE 105* 63*  BUN 21 23  CREATININE 0.47 0.36*  CALCIUM 6.7* 5.3*    No results found for: CEA1  Studies/Results: No results found.  Medications: I have reviewed the patient's current medications.  Assessment/Plan: 1.  Normocytic anemia-chronic 2.  Rheumatoid arthritis 3.  Severe malnutrition 4.   Oral candidiasis-treated with nystatin 5.   Esophageal dysmotility 6.   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 This is likely due to PICC line draw.  Prior labs show no evidence for hemolysis.  Hopefully the anemia will improve with resolution of the severe malnutrition.  Recommendations: 1.  Optimize nutrition 2.  Please draw future labs peripherally to ensure accuracy 3.  Limit future blood draws    LOS: 10 days   Mikey Bussing, NP   12/22/2019, 10:48 AM The acute drop in hemoglobin with the a.m. CBC may be related to the PICC draw.  Hopefully the anemia will improve with optimize nutrition.  [facility] will [facility]

## 2019-12-22 NOTE — Progress Notes (Addendum)
Notified by tele patient had a 5-6 beat of SVT possibly, back in sinus rhythm.  MD Rosita Fire notified, while at bedside.

## 2019-12-23 DIAGNOSIS — R531 Weakness: Secondary | ICD-10-CM | POA: Diagnosis not present

## 2019-12-23 DIAGNOSIS — E876 Hypokalemia: Secondary | ICD-10-CM | POA: Diagnosis not present

## 2019-12-23 LAB — CBC
HCT: 24.3 % — ABNORMAL LOW (ref 36.0–46.0)
Hemoglobin: 8.1 g/dL — ABNORMAL LOW (ref 12.0–15.0)
MCH: 31.5 pg (ref 26.0–34.0)
MCHC: 33.3 g/dL (ref 30.0–36.0)
MCV: 94.6 fL (ref 80.0–100.0)
Platelets: 146 10*3/uL — ABNORMAL LOW (ref 150–400)
RBC: 2.57 MIL/uL — ABNORMAL LOW (ref 3.87–5.11)
RDW: 15.6 % — ABNORMAL HIGH (ref 11.5–15.5)
WBC: 4.1 10*3/uL (ref 4.0–10.5)
nRBC: 0 % (ref 0.0–0.2)

## 2019-12-23 LAB — COMPREHENSIVE METABOLIC PANEL
ALT: 19 U/L (ref 0–44)
AST: 21 U/L (ref 15–41)
Albumin: <1 g/dL — ABNORMAL LOW (ref 3.5–5.0)
Alkaline Phosphatase: 75 U/L (ref 38–126)
Anion gap: 5 (ref 5–15)
BUN: 21 mg/dL (ref 8–23)
CO2: 19 mmol/L — ABNORMAL LOW (ref 22–32)
Calcium: 7 mg/dL — ABNORMAL LOW (ref 8.9–10.3)
Chloride: 107 mmol/L (ref 98–111)
Creatinine, Ser: 0.5 mg/dL (ref 0.44–1.00)
GFR calc Af Amer: >60 mL/min (ref >60)
GFR calc non Af Amer: >60 mL/min (ref >60)
Glucose, Bld: 148 mg/dL — ABNORMAL HIGH (ref 70–99)
Potassium: 3.9 mmol/L (ref 3.5–5.1)
Sodium: 131 mmol/L — ABNORMAL LOW (ref 135–145)
Total Bilirubin: 0.3 mg/dL (ref 0.3–1.2)
Total Protein: 4.6 g/dL — ABNORMAL LOW (ref 6.5–8.1)

## 2019-12-23 LAB — PHOSPHORUS: Phosphorus: 2.4 mg/dL — ABNORMAL LOW (ref 2.5–4.6)

## 2019-12-23 LAB — GLUCOSE, CAPILLARY
Glucose-Capillary: 118 mg/dL — ABNORMAL HIGH (ref 70–99)
Glucose-Capillary: 123 mg/dL — ABNORMAL HIGH (ref 70–99)
Glucose-Capillary: 135 mg/dL — ABNORMAL HIGH (ref 70–99)
Glucose-Capillary: 137 mg/dL — ABNORMAL HIGH (ref 70–99)
Glucose-Capillary: 148 mg/dL — ABNORMAL HIGH (ref 70–99)
Glucose-Capillary: 150 mg/dL — ABNORMAL HIGH (ref 70–99)

## 2019-12-23 LAB — METHYLMALONIC ACID, SERUM: Methylmalonic Acid, Quantitative: 137 nmol/L (ref 0–378)

## 2019-12-23 LAB — MAGNESIUM: Magnesium: 1.9 mg/dL (ref 1.7–2.4)

## 2019-12-23 MED ORDER — TRAVASOL 10 % IV SOLN
INTRAVENOUS | Status: AC
  Filled 2019-12-23: qty 307.2

## 2019-12-23 MED ORDER — BOOST / RESOURCE BREEZE PO LIQD CUSTOM
1.0000 | Freq: Three times a day (TID) | ORAL | Status: DC
  Administered 2019-12-23 – 2019-12-30 (×22): 1 via ORAL

## 2019-12-23 NOTE — Progress Notes (Signed)
Occupational Therapy Progress Note (late entry)  Pt demonstrates improving activity tolerance, improved strength and improved ability to perform ADLs.  She currently requires set up assist to mod A.  She was instructed in HEP for bil UEs and theraputty for hand strength.  Will continue to follow and recommend HHOT    12/22/19 1400  OT Visit Information  Assistance Needed +1  History of Present Illness Pt is a 74 y.o. Cunningham admitted on 12/12/19 for progressive weakness, poor po intake, bil LE swelling.  Pt dx with severe protein calorie malnutrition (concern for malignancy), medical workup in progress.  Pt also dx with anemia acute on chronic, hypokalemia, hypomagnesemia, stage 1 sacral wound, QT prolongation, and hypocalcemia.  Pt with significant PMH of dysphagia (s/p EGD with esophageal dialation), CKD III, anemia, RA.    Precautions  Precautions Fall  Precaution Comments incontinent  Pain Assessment  Faces Pain Scale 2  Pain Location shoulders after performing theraband exercises   Pain Descriptors / Indicators Discomfort  Cognition  Arousal/Alertness Awake/alert  Behavior During Therapy WFL for tasks assessed/performed  Overall Cognitive Status Within Functional Limits for tasks assessed  Upper Extremity Assessment  RUE Deficits / Details arthritic deformity noted.  Unable to open containers on lunch tray   RUE Coordination decreased fine motor  LUE Deficits / Details arthritic deformity noted.  Unable to open containers on lunch tray   LUE Coordination decreased fine motor  ADL  Overall ADL's  Needs assistance/impaired  Eating/Feeding Set up  Lower Body Bathing Sit to/from stand;Moderate assistance  Lower Body Dressing Moderate assistance;Sit to/from stand  Lower Body Dressing Details (indicate cue type and reason) able to push socks off of heels, but unable to fully doff them due to edema and weakness and unable to don them   Functional mobility during ADLs Minimal  assistance;Rolling walker;Moderate assistance  Bed Mobility  General bed mobility comments pt up in chair   Exercises  Exercises Other exercises  Other Exercises  Other Exercises Pt provided with level 2 theraband with loops to place band over wrists to avoid strain on fingers.  She perofrmed 10 reps bil horizontal abduction, then complained of mild pain in shoulders.  Long discussion about alignment and stress on arthrtic joints.  Instructed her how to "set" her scapulas. she repeated  for 10 reps with improvement in discomfort/pain.  Cautioned her from performing other shoulder exercises with the band at this time due to potential stress to the joint   Other Exercises Pt instructed in shoulder flexion to 90* with good aligment of joint and 10 second holds.  She performed x 7 before fatiguing   Other Exercises Pt provided with tan (super soft) theraputty and was instructed in exercises for grip and pinch   OT - End of Session  Activity Tolerance Patient tolerated treatment well  Patient left in chair;with call bell/phone within reach  Nurse Communication Mobility status  OT Assessment/Plan  OT Plan Discharge plan remains appropriate  OT Visit Diagnosis Unsteadiness on feet (R26.81);Repeated falls (R29.6);Muscle weakness (generalized) (M62.81);Pain  Pain - Right/Left Left  Pain - part of body Shoulder  OT Frequency (ACUTE ONLY) Min 2X/week  Follow Up Recommendations Supervision/Assistance - 24 hour;Home health OT  OT Equipment 3 in 1 bedside commode;Tub/shower bench  AM-PAC OT "6 Clicks" Daily Activity Outcome Measure (Version 2)  Help from another person eating meals? 4  Help from another person taking care of personal grooming? 3  Help from another person toileting, which includes using toliet,  bedpan, or urinal? 2  Help from another person bathing (including washing, rinsing, drying)? 2  Help from another person to put on and taking off regular upper body clothing? 3  Help from another  person to put on and taking off regular lower body clothing? 2  6 Click Score 16  OT Goal Progression  Progress towards OT goals Progressing toward goals  OT Time Calculation  OT Start Time (ACUTE ONLY) 1133  OT Stop Time (ACUTE ONLY) 1159  OT Time Calculation (min) 26 min  OT General Charges  $OT Visit 1 Visit  OT Treatments  $Self Care/Home Management  8-22 mins  $Therapeutic Exercise 8-22 mins  Nilsa Nutting., OTR/L Acute Rehabilitation Services Pager [phone] Office [phone]

## 2019-12-23 NOTE — Plan of Care (Signed)
  Problem: Education: Goal: Knowledge of General Education information will improve Description Including pain rating scale, medication(s)/side effects and non-pharmacologic comfort measures Outcome: Progressing   

## 2019-12-23 NOTE — Progress Notes (Signed)
PHARMACY - TOTAL PARENTERAL NUTRITION CONSULT NOTE   Indication: malnutrition  Patient Measurements: Height: 5\' 4"  (162.6 cm) Weight: 46.5 kg (102 lb 8.2 oz) IBW/kg (Calculated) : 54.7 TPN AdjBW (KG): 42.2 Body mass index is 17.6 kg/m.  Assessment: 74 yo female initially presented on 3/26 for weakness. Patient with PMH of RA, chronic anemia and GERD  She has had ongoing poor intake since Dec 2020 with LE swelling and unintentional weight loss. W/U does not seem to find anything significant thus far, but continues to have inadequate intake  48 hour calorie count 4/3 - 4/5: inconsistent intake, not meeting goals  Glucose / Insulin: No SSI needed, Glu 1 random 148 - no SSI used Electrolytes:  Na 131 (from 129), K 3.9, Phos 2.4 (slightly low), Mg 1.9 Renal: SCr 0.36 LFTs / TGs: Trigs 87, LFTs WNL Prealbumin / albumin: Prealbumin 5, Albumin < 1 Intake / Output; MIVF: 0 stools, 5 urine occurrences (amount not charted) GI Imaging: 3/31 - barium swallow - unable to pass tablet - study shows esophageal dysmotility, dilation, and narrowing   4/1 CT abd pelvis - anasarca and ascites; likely underlying liver disease  Surgeries / Procedures: see above GI recommends EGD with dilation but pt needs improved nutritional status   Central access: PICC 4/1 TPN start date: 12/22/2019  Nutritional Goals (per RD recommendation on 4/2): kCal: 1600 - 1800 / day, Protein: 85 - 100 g/day, Fluid: > 1.6 L Goal TPN rate is 70 mL/hr (provides 55 g of protein and 1400 kcals per day) since pt is regularly taking the 3 prostat ordered daily  Current Nutrition:  TPN Dysphagia 3 - but really only taking in some liquids (solid foods cause vomiting) Prostat TID - 100 cal, 15 g protein, 10 g carbs (each) Ensure ordered - unable to tolerate 4/5 - caused nausea  Plan:  Advance TPN to 40 mL/hr at 1800   Electrolytes in TPN: 50>>75 mEq/L of Na, 13mEq/L of K, 78mEq/L of Ca, 71mEq/L of Mg, and 15>>18 mmol/L of Phos.  Cl:Ac ratio 1:2  Hold MVI and Trace in TPN as patient taking orally Continue Sensitive q6h SSI and adjust as needed - may need increase if CBGs go up as rate increases Monitor TPN labs on Mon/Thurs and in am tomorrow  Barth Kirks, PharmD, BCPS, BCCCP Clinical Pharmacist [phone]  Please check AMION for all [place] numbers  12/23/2019 7:53 AM

## 2019-12-23 NOTE — Progress Notes (Signed)
Nutrition Follow-up  DOCUMENTATION CODES:   Underweight, Severe malnutrition in context of chronic illness  INTERVENTION:   -D/c Ensure Enlive -Continue Boost Breeze po TID, each supplement provides 250 kcal and 9 grams of protein -Continue 30 ml Prostat TID, each supplement provides 100 kcals and 15 grams protein -TPN management per pharmacy  NUTRITION DIAGNOSIS:   Severe Malnutrition related to decreased appetite as evidenced by energy intake < or equal to 75% for > or equal to 1 month, severe fat depletion, moderate muscle depletion, severe muscle depletion.  Ongoing  GOAL:   Patient will meet greater than or equal to 90% of their needs  Progressing   MONITOR:   PO intake, Supplement acceptance, Diet advancement, Labs, Weight trends, Skin, I & O's  REASON FOR ASSESSMENT:   Consult Calorie Count  ASSESSMENT:   Deanna Cunningham is a 74 y.o. female presenting with weakness. PMH is significant for Rheumatoid Arthritis (no tx, no rheumatologist), chronic anemia and GERD.  4/2- PICC placed 4/5- TPN initiated  Reviewed I/O's: +720 ml x 24 hours and +7 L x 24 hours  Spoke with pt and daughter at bedside. Pt reports feeling better today. She is currently sipping on a [employer] supplement. She reports she has poor tolerance of Ensure Enlive, which makes her stomach hurt, but able to tolerate Boost Breeze supplements (berry and peach flavor). She has been consuming mostly liquids, but is willing to try purees for increased food selections. She still likes the Prostat supplements  Pt currently receiving TPN at 30 ml/hr, which provides 608 kcals and 23 grams protein, which meets 35% of estimated kcal needs and 26% of estimated protein needs. Per pharmacy note, plan to increase TPN to 40 ml/hr today, which provides 810 kcals, and 30 grams protein, meeting 46% of estimated kcal needs and 33% of estimated needs.  Average calorie count over the past 24 hours is 1238 kcals and 85  grams protein. PO intake + TPN providing approximately 1846 kcals and 108 grams protein, meeting 100% of estimated nutritional needs.   Labs reviewed: Na: 131, Phos: 2.4, CBGS: 80-137.   Diet Order:   Diet Order            DIET - DYS 1 Room service appropriate? Yes with Assist; Fluid consistency: Thin  Diet effective now              EDUCATION NEEDS:   Education needs have been addressed  Skin:  Skin Assessment: Skin Integrity Issues: Skin Integrity Issues:: Other (Comment) Other: MASD to groin, labia, buttocks, and sacrum  Last BM:  12/21/19  Height:   Ht Readings from Last 1 Encounters:  12/13/19 5\' 4"  (1.626 m)    Weight:   Wt Readings from Last 1 Encounters:  12/23/19 46.5 kg    Ideal Body Weight:  54.5 kg  BMI:  Body mass index is 17.6 kg/m.  Estimated Nutritional Needs:   Kcal:  [id number]  Protein:  90-105 grams  Fluid:  > 1.7 L    [name], RD, LDN, [place] Registered Dietitian II Certified Diabetes Care and Education Specialist Please refer to [facility] for RD and/or RD on-call/weekend/after hours pager

## 2019-12-23 NOTE — Progress Notes (Signed)
Family Medicine Teaching Service Daily Progress Note Intern Pager: [phone]  Patient name: Deanna Cunningham Medical record number: [id number] Date of birth: March 06, 1946 Age: 74 y.o. Gender: female  Primary Care Provider: Kristie Cowman, MD Consultants: None, consider reaching out to patient's orthopedist Code Status: Full code  Pt Overview and Major Events to Date:  3/26-patient admitted for weakness, malnutrition   Assessment and Plan: Deanna Cunningham is a 74 y.o. female presenting with weakness. PMH is significant for Rheumatoid Arthritis (no tx, no rheumatologist), chronic anemia and GERD.  Underweight, severe protein calorie malnutrition: stable. She continues to want to drink the boost drinks. Feels ok, denies nausea, vomiting, diarrhea. Abdominal size has increased some and LE up to thigh area. Continue to monitor for refeeding syndrome. Suspect highly possible with TPN. -Dietitian on board, would appreciate recommendation -Continue MVI, 30 mL prostat TID, and TPN -GI on board, appreciate recommendations: table EGD for now; patient may not be a candidate with her current state of health -Daily weights, strict I&O -Work with social work/PT/OT for disposition   Esophageal dysmotility: Stable. Evident on 3/31 barium swallow. Unable to tolerated dysphagia diet. TPN started 4/5 -Monitor mag, Phos due to refeeding syndrome and replete as appropriate per pharmacy  Normocytic, hypochromic anemia: Chronic, currently stable. Last Hgb 8.1, around baseline. S/p 1 U RBCs total during admit.  -Heme/onc consulted and appreciate recommendations, will follow outpatient -f/u AM CBC  GERD: Chronic, improved. Minimal symptoms on Pepcid and Protonix. -Famotidine 20mg  BID -Protonix 40 mg -Sucralfate with meals/bedtime  Oral candidiasis: Resolved. -Magic mouthwash    Sacral wound: stable.  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 Prolonged QTC 509 on 3/31.  Follows with [facility], Dr. Gardiner Rhyme, outpatient.  -Avoid QT prolonging medications  Rheumatoid arthritis Ulnar deviation of MCP joints bilaterally. Does not have Rheumatologist and is not on DMARDs. -Monitor pain   -Ambulatory referral to Rheum on discharge   FEN/GI: Dysphagia 3, replete electrolytes as appropriate Prophylaxis: Encourage ambulation, SCDs   Disposition: SNF pending continued medical management  Subjective:  She feels ok. Does not feel nauseous.   Objective: Temp:  [97.5 F (36.4 C)-98.1 F (36.7 C)] 98.1 F (36.7 C) (04/06 0743) Pulse Rate:  [95-99] 98 (04/06 0743) Resp:  [15-18] 16 (04/06 0743) BP: (101-118)/(60-73) 102/60 (04/06 0743) SpO2:  [98 %-99 %] 99 % (04/06 0500) Weight:  [46.5 kg] 46.5 kg (04/06 0500)   Physical Exam:  General: Appears very chacetic, no acute distress. Appears older than state age. Cardiac: RRR, faint heart sounds, no murmurs Respiratory: CTAB, normal effort Abdomen: soft, protuberant, nontender, non distended, +BS Extremities: 2+ Pitting edema up to thigh area Skin: Warm and dry, signs of venous stasis  Laboratory: Recent Labs  Lab 12/21/19 0347 12/22/19 0412 12/23/19 0349  WBC 4.3 3.4* 4.1  HGB 10.2* 8.0* 8.1*  HCT 31.8* 24.8* 24.3*  PLT 180 130* 146*   Recent Labs  Lab 12/19/19 0437 12/20/19 0332 12/22/19 0412 12/22/19 1813 12/23/19 0349  NA 130*   < > 138 127* 131*  K 4.2   < > 2.9* 4.9 3.9  CL 105   < > 117* 105 107  CO2 21*   < > 18* 19* 19*  BUN 16   < > 23 25* 21  CREATININE 0.50   < > 0.36* 0.44 0.50  CALCIUM 6.8*   < >  5.3* 6.8* 7.0*  PROT 4.6*  --  3.2*  --  4.6*  BILITOT 0.1*  --  0.2*  --  0.3  ALKPHOS 77  --  52  --  75  ALT 19  --  14  --  19  AST 15  --  12*  --  21  GLUCOSE 79   < > 63* 99 148*   < > = values in this interval not displayed.    Imaging/Diagnostic  Tests: No new imaging  Gerlene Fee, DO 12/23/2019, 8:27 AM PGY-1, [place] Intern pager: [phone], text pages welcome

## 2019-12-23 NOTE — Plan of Care (Signed)
  Problem: Pain Managment: Goal: General experience of comfort will improve Outcome: Progressing   Problem: Safety: Goal: Ability to remain free from injury will improve Outcome: Progressing   Problem: Skin Integrity: Goal: Risk for impaired skin integrity will decrease Outcome: Progressing   

## 2019-12-23 NOTE — Progress Notes (Signed)
Subjective: No new complaints.  She was not able to tolerate an Ensure yesterday.  It made her feel nauseous.  Objective: Vital signs in last 24 hours: Temp:  [97.4 F (36.3 C)-97.8 F (36.6 C)] 97.8 F (36.6 C) (04/06 0500) Pulse Rate:  [95-99] 99 (04/06 0500) Resp:  [15-18] 16 (04/06 0500) BP: (92-118)/(60-73) 118/73 (04/06 0500) SpO2:  [98 %-99 %] 99 % (04/06 0500) Weight:  [46.5 kg] 46.5 kg (04/06 0500) Last BM Date: 12/21/19  Intake/Output from previous day: 04/05 0701 - 04/06 0700 In: 720 [P.O.:720] Out: -  Intake/Output this shift: Total I/O In: 600 [P.O.:600] Out: -   General appearance: alert, cachectic and no distress GI: soft, non-tender; bowel sounds normal; no masses,  no organomegaly  Lab Results: Recent Labs    12/21/19 0347 12/22/19 0412 12/23/19 0349  WBC 4.3 3.4* 4.1  HGB 10.2* 8.0* 8.1*  HCT 31.8* 24.8* 24.3*  PLT 180 130* 146*   BMET Recent Labs    12/22/19 0412 12/22/19 1813 12/23/19 0349  NA 138 127* 131*  K 2.9* 4.9 3.9  CL 117* 105 107  CO2 18* 19* 19*  GLUCOSE 63* 99 148*  BUN 23 25* 21  CREATININE 0.36* 0.44 0.50  CALCIUM 5.3* 6.8* 7.0*   LFT Recent Labs    12/23/19 0349  PROT 4.6*  ALBUMIN <1.0*  AST 21  ALT 19  ALKPHOS 75  BILITOT 0.3   PT/INR No results for input(s): LABPROT, INR in the last 72 hours. Hepatitis Panel No results for input(s): HEPBSAG, HCVAB, HEPAIGM, HEPBIGM in the last 72 hours. C-Diff No results for input(s): CDIFFTOX in the last 72 hours. Fecal Lactopherrin No results for input(s): FECLLACTOFRN in the last 72 hours.  Studies/Results: No results found.  Medications:  Scheduled: . sodium chloride   Intravenous Once  . Chlorhexidine Gluconate Cloth  6 each Topical Daily  . dextrose      . famotidine  20 mg Oral BID  . feeding supplement  1 Container Oral BID BM  . feeding supplement (ENSURE ENLIVE)  237 mL Oral BID BM  . feeding supplement (PRO-STAT SUGAR FREE 64)  30 mL Oral TID with  meals  . insulin aspart  0-9 Units Subcutaneous Q6H  . mouth rinse  15 mL Mouth Rinse BID  . multivitamin with minerals  1 tablet Oral Daily  . pantoprazole  40 mg Oral Daily  . phosphorus  250 mg Oral TID  . sodium chloride flush  10-40 mL Intracatheter Q12H  . sucralfate  1 g Oral TID WC & HS   Continuous: . TPN ADULT (ION) 30 mL/hr at 12/22/19 1810    Assessment/Plan: 1) Severe malnutrition. 2) Intermittent nausea/vomiting. 3) Distal esophageal stricture.   The patient was started on TPN.  Today's albumin is <1.0.  She will benefit with an EGD with dilation, but her nutritional status is so poor that if any complication occurs with the procedure she may not survive.  For the time being it is best to continue with TPN and to encourage liquids to meet oral caloric requirements.  Plan: 1) Continue with TPN. 2) Hold on any endoscopic procedures at this time.  LOS: 11 days   Steven Veazie D 12/23/2019, 6:35 AM

## 2019-12-24 DIAGNOSIS — L89152 Pressure ulcer of sacral region, stage 2: Secondary | ICD-10-CM | POA: Insufficient documentation

## 2019-12-24 DIAGNOSIS — E46 Unspecified protein-calorie malnutrition: Secondary | ICD-10-CM | POA: Diagnosis not present

## 2019-12-24 LAB — COMPREHENSIVE METABOLIC PANEL
ALT: 18 U/L (ref 0–44)
AST: 18 U/L (ref 15–41)
Albumin: <1 g/dL — ABNORMAL LOW (ref 3.5–5.0)
Alkaline Phosphatase: 68 U/L (ref 38–126)
Anion gap: 5 (ref 5–15)
BUN: 18 mg/dL (ref 8–23)
CO2: 21 mmol/L — ABNORMAL LOW (ref 22–32)
Calcium: 7 mg/dL — ABNORMAL LOW (ref 8.9–10.3)
Chloride: 106 mmol/L (ref 98–111)
Creatinine, Ser: 0.42 mg/dL — ABNORMAL LOW (ref 0.44–1.00)
GFR calc Af Amer: >60 mL/min (ref >60)
GFR calc non Af Amer: >60 mL/min (ref >60)
Glucose, Bld: 101 mg/dL — ABNORMAL HIGH (ref 70–99)
Potassium: 3.2 mmol/L — ABNORMAL LOW (ref 3.5–5.1)
Sodium: 132 mmol/L — ABNORMAL LOW (ref 135–145)
Total Bilirubin: 0.4 mg/dL (ref 0.3–1.2)
Total Protein: 4.6 g/dL — ABNORMAL LOW (ref 6.5–8.1)

## 2019-12-24 LAB — PHOSPHORUS: Phosphorus: 2.5 mg/dL (ref 2.5–4.6)

## 2019-12-24 LAB — MAGNESIUM: Magnesium: 1.6 mg/dL — ABNORMAL LOW (ref 1.7–2.4)

## 2019-12-24 LAB — GLUCOSE, CAPILLARY
Glucose-Capillary: 100 mg/dL — ABNORMAL HIGH (ref 70–99)
Glucose-Capillary: 97 mg/dL (ref 70–99)
Glucose-Capillary: 128 mg/dL — ABNORMAL HIGH (ref 70–99)

## 2019-12-24 MED ORDER — POTASSIUM CHLORIDE 10 MEQ/50ML IV SOLN
10.0000 meq | INTRAVENOUS | Status: AC
  Administered 2019-12-24 (×2): 10 meq via INTRAVENOUS
  Filled 2019-12-24 (×2): qty 50

## 2019-12-24 MED ORDER — STERILE WATER FOR INJECTION IV SOLN
INTRAVENOUS | Status: AC
  Filled 2019-12-24: qty 366.4

## 2019-12-24 MED ORDER — MAGNESIUM SULFATE 2 GM/50ML IV SOLN
2.0000 g | Freq: Once | INTRAVENOUS | Status: AC
  Administered 2019-12-24: 2 g via INTRAVENOUS
  Filled 2019-12-24: qty 50

## 2019-12-24 NOTE — Progress Notes (Signed)
Family Medicine Teaching Service Daily Progress Note Intern Pager: [phone]  Patient name: Deanna Cunningham Medical record number: [id number] Date of birth: March 01, 1946 Age: 74 y.o. Gender: female  Primary Care Provider: Kristie Cowman, MD Consultants: None, consider reaching out to patient's orthopedist Code Status: Full code  Pt Overview and Major Events to Date:  3/26-patient admitted for weakness, malnutrition   Assessment and Plan: Deanna Cunningham is a 74 y.o. female presenting with weakness. PMH is significant for Rheumatoid Arthritis (no tx, no rheumatologist), chronic anemia and GERD.  Underweight, severe protein calorie malnutrition: stable. Tolerating soft diet and TPN well. Continue to monitor for refeeding syndrome.  -Dietitian on board, would appreciate recommendation -Continue MVI, 30 mL prostat TID, and TPN -GI on board, appreciate recommendations: table EGD for now; patient may not be a candidate with her current state of health -Daily weights, strict I&O -Work with social work/PT/OT for disposition   Esophageal dysmotility: Stable. Evident on 3/31 barium swallow. Unable to tolerated dysphagia diet. TPN started 4/5 -Monitor mag, Phos due to refeeding syndrome and replete as appropriate per pharmacy  Normocytic, hypochromic anemia: Chronic, currently stable. Last Hgb 8.1, around baseline. S/p 1 U RBCs total during admit.  -Heme/onc consulted and appreciate recommendations, will follow outpatient -f/u AM CBC  GERD: Chronic, improved. Minimal symptoms on Pepcid and Protonix. -Famotidine 20mg  BID -Protonix 40 mg -Sucralfate with meals/bedtime  Oral candidiasis: Resolved. -Magic mouthwash    Sacral wound: stable.  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 Prolonged QTC 509 on 3/31.  Follows with [facility], Dr.  Gardiner Cunningham, outpatient.  -Avoid QT prolonging medications  Rheumatoid arthritis Ulnar deviation of MCP joints bilaterally. Does not have Rheumatologist and is not on DMARDs. -Monitor pain   -Ambulatory referral to Rheum on discharge   FEN/GI: Dysphagia 3, replete electrolytes as appropriate Prophylaxis: Encourage ambulation, SCDs   Disposition: SNF pending continued medical management  Subjective:  She feels better than she has felt her entire hospitalization.    Objective: Temp:  [98.3 F (36.8 C)-98.7 F (37.1 C)] 98.3 F (36.8 C) (04/07 0323) Pulse Rate:  [100-109] 100 (04/07 0323) Resp:  [16-20] 16 (04/07 0323) BP: (101-110)/(57-67) 101/67 (04/07 0323) SpO2:  [98 %] 98 % (04/07 0323)   Physical Exam:  General: Appears unwell yet looking better, no acute distress. Age appropriate. Cardiac: RRR, faint heart sounds, no murmurs Respiratory: CTAB, normal effort Abdomen: soft, nontender, nondistended, +BS Extremities:LE 3+ pitting edema   Laboratory: Recent Labs  Lab 12/21/19 0347 12/22/19 0412 12/23/19 0349  WBC 4.3 3.4* 4.1  HGB 10.2* 8.0* 8.1*  HCT 31.8* 24.8* 24.3*  PLT 180 130* 146*   Recent Labs  Lab 12/22/19 0412 12/22/19 0412 12/22/19 1813 12/23/19 0349 12/24/19 0436  NA 138   < > 127* 131* 132*  K 2.9*   < > 4.9 3.9 3.2*  CL 117*   < > 105 107 106  CO2 18*   < > 19* 19* 21*  BUN 23   < > 25* 21 18  CREATININE 0.36*   < > 0.44 0.50 0.42*  CALCIUM 5.3*   < > 6.8* 7.0* 7.0*  PROT 3.2*  --   --  4.6* 4.6*  BILITOT 0.2*  --   --  0.3 0.4  ALKPHOS 52  --   --  75 68  ALT 14  --   --  19 18  AST 12*  --   --  21 18  GLUCOSE 63*   < > 99 148* 101*   < > = values in this interval not displayed.    Imaging/Diagnostic Tests: No new imaging  Gerlene Fee, DO 12/24/2019, 7:51 AM PGY-1, [place] Intern pager: [phone], text pages welcome

## 2019-12-24 NOTE — Progress Notes (Signed)
Physical Therapy Treatment Patient Details Name: Deanna Cunningham MRN: [id number] DOB: 1945/12/01 Today's Date: 12/24/2019    History of Present Illness Pt is a 74 y.o. female admitted on 12/12/19 for progressive weakness, poor po intake, bil LE swelling.  Pt dx with severe protein calorie malnutrition (concern for malignancy), medical workup in progress.  Pt also dx with anemia acute on chronic, hypokalemia, hypomagnesemia, stage 1 sacral wound, QT prolongation, and hypocalcemia.  Pt with significant PMH of dysphagia (s/p EGD with esophageal dialation), CKD III, anemia, RA.      PT Comments    Pt in bed upon arrival of PT, agreeable to PT session despite reports of fatigue today. The pt continues to present with limitations in functional strength, power, and endurance, as evidenced by need for modA to stand from low surfaces such as the recliner and BSC as well as short tolerance for ambulation. The pt was then instructed in general LE strengthening exercises to facilitate improved functional strength for transfers int her future. The pt will continue to benefit from skilled PT to maximize strengthening and independence prior to d/c.    Follow Up Recommendations  Home health PT;Supervision/Assistance - 24 hour     Equipment Recommendations  3in1 (PT)    Recommendations for Other Services       Precautions / Restrictions Precautions Precautions: Fall Precaution Comments: incontinent Restrictions Weight Bearing Restrictions: No    Mobility  Bed Mobility Overal bed mobility: Needs Assistance Bed Mobility: Sit to Supine     Supine to sit: Min guard;HOB elevated Sit to supine: Min assist   General bed mobility comments: minA for BLE management with return to supine  Transfers Overall transfer level: Needs assistance Equipment used: Rolling walker (2 wheeled) Transfers: Sit to/from Stand Sit to Stand: Mod assist         General transfer comment: mod assist to power up, bed  raised, VCs for hand placement  Ambulation/Gait Ambulation/Gait assistance: Min guard;Min assist Gait Distance (Feet): 30 Feet Assistive device: Rolling walker (2 wheeled) Gait Pattern/deviations: Step-through pattern;Decreased stride length;Trunk flexed;Decreased dorsiflexion - left Gait velocity: decreased   General Gait Details: pt reports fatiguing much faster today, asking to sit after 15 ft bouts of walking. good management of RW with ambulation, sig slowed gait but steady   Stairs             Wheelchair Mobility    Modified Rankin (Stroke Patients Only)       Balance Overall balance assessment: Needs assistance Sitting-balance support: Feet supported Sitting balance-Leahy Scale: Fair     Standing balance support: Bilateral upper extremity supported;During functional activity Standing balance-Leahy Scale: Poor Standing balance comment: requires UE support                             Cognition Arousal/Alertness: Awake/alert Behavior During Therapy: WFL for tasks assessed/performed Overall Cognitive Status: Within Functional Limits for tasks assessed                                 General Comments: grossly assessed       Exercises General Exercises - Lower Extremity Quad Sets: Strengthening;Both;10 reps;Supine(pressing into pillow, hold for 5 counts) Other Exercises Other Exercises: supine bridges in bed 2 x 10    General Comments General comments (skin integrity, edema, etc.): Pt reports sig worsened fatigue today, limited ambulation due to fatigue. HR  112-127 sitting EOB after ambulation      Pertinent Vitals/Pain Pain Assessment: Faces Faces Pain Scale: Hurts a little bit Pain Location: BLE Pain Descriptors / Indicators: Discomfort;Grimacing;Sore Pain Intervention(s): Limited activity within patient's tolerance;Monitored during session;Repositioned    Home Living                      Prior Function             PT Goals (current goals can now be found in the care plan section) Acute Rehab PT Goals Patient Stated Goal: to be independent with AE PT Goal Formulation: With patient Time For Goal Achievement: 12/27/19 Potential to Achieve Goals: Fair Progress towards PT goals: Progressing toward goals    Frequency           PT Plan Current plan remains appropriate;Discharge plan needs to be updated    Co-evaluation              AM-PAC PT "6 Clicks" Mobility   Outcome Measure  Help needed turning from your back to your side while in a flat bed without using bedrails?: A Little Help needed moving from lying on your back to sitting on the side of a flat bed without using bedrails?: A Little Help needed moving to and from a bed to a chair (including a wheelchair)?: A Little Help needed standing up from a chair using your arms (e.g., wheelchair or bedside chair)?: A Little Help needed to walk in hospital room?: A Little Help needed climbing 3-5 steps with a railing? : A Lot 6 Click Score: 17    End of Session Equipment Utilized During Treatment: Gait belt Activity Tolerance: Patient tolerated treatment well;Patient limited by fatigue Patient left: in chair;with call bell/phone within reach;with chair alarm set Nurse Communication: Mobility status PT Visit Diagnosis: Other abnormalities of gait and mobility (R26.89);Muscle weakness (generalized) (M62.81);Difficulty in walking, not elsewhere classified (R26.2) Pain - Right/Left: (bilateral) Pain - part of body: Leg;Ankle and joints of foot     Time: [id number] PT Time Calculation (min) (ACUTE ONLY): 31 min  Charges:  $Gait Training: 8-22 mins $Therapeutic Exercise: 8-22 mins                     Karma Ganja, PT, DPT   Acute Rehabilitation Department Pager #: [phone]   Otho Bellows 12/24/2019, 2:46 PM

## 2019-12-24 NOTE — Plan of Care (Signed)

## 2019-12-24 NOTE — Progress Notes (Signed)
Nutrition Follow-up  DOCUMENTATION CODES:   Underweight, Severe malnutrition in context of chronic illness  INTERVENTION:   -Continue Boost Breeze po TID, each supplement provides 250 kcal and 9 grams of protein -Continue 30 ml Prostat TID, each supplement provides 100 kcals and 15 grams protein -TPN management per pharmacy  NUTRITION DIAGNOSIS:   Severe Malnutrition related to decreased appetite as evidenced by energy intake < or equal to 75% for > or equal to 1 month, severe fat depletion, moderate muscle depletion, severe muscle depletion.  Ongoing  GOAL:   Patient will meet greater than or equal to 90% of their needs  Progressing  MONITOR:   PO intake, Supplement acceptance, Diet advancement, Labs, Weight trends, Skin, I & O's  REASON FOR ASSESSMENT:   Consult Calorie Count  ASSESSMENT:   Deanna Cunningham is a 74 y.o. female presenting with weakness. PMH is significant for Rheumatoid Arthritis (no tx, no rheumatologist), chronic anemia and GERD.  4/2- PICC placed 4/5- TPN initiated  Reviewed I/O's: +1.3 L x 24 hours and +8.3 L since admission  UOP: 526 ml x 24 hours  Pt currently receiving TPN at 40 ml/hr, which provides 810 kcals, and 30 grams protein, meeting 46% of estimated kcal needs and 33% of estimated needs. Per pharmacy note, plan to increase TPN to 50 ml/hr today, which provides  1444 kcals and 55 grams protein, meeting 83% of estimated kcal needs and 61% of estimated protein needs.   Over the past 24 hours, pt has received  2450 kcals and 123 grams protein (TPN + po intake), meeting > 100% of estimated kcal and protein needs.   Labs reviewed: K: 3.2 (on IV supplementation) , Mg: 1.6 (on IV supplementation), Phos WDL. CBGS: 97-150 (inpatient orders for glycemic control are 0-9 units insulin aspart every 6 hours).   Diet Order:   Diet Order            DIET - DYS 1 Room service appropriate? Yes with Assist; Fluid consistency: Thin  Diet effective now               EDUCATION NEEDS:   Education needs have been addressed  Skin:  Skin Assessment: Skin Integrity Issues: Skin Integrity Issues:: Other (Comment) Other: MASD to groin, labia, buttocks, and sacrum  Last BM:  12/21/19  Height:   Ht Readings from Last 1 Encounters:  12/13/19 5\' 4"  (1.626 m)    Weight:   Wt Readings from Last 1 Encounters:  12/23/19 46.5 kg    Ideal Body Weight:  54.5 kg  BMI:  Body mass index is 17.6 kg/m.  Estimated Nutritional Needs:   Kcal:  [id number]  Protein:  90-105 grams  Fluid:  > 1.7 L    [name], RD, LDN, [place] Registered Dietitian II Certified Diabetes Care and Education Specialist Please refer to [facility] for RD and/or RD on-call/weekend/after hours pager

## 2019-12-24 NOTE — Progress Notes (Signed)
Subjective: Since I last evaluated the patient, not much seems to have changed for her.  She was started on TPN yesterday.  She complains of receiving very" thick foods" which she is not able to swallow.  When I looked at her tray she had some mashed potatoes and a piece of meat which she has not been able to eat.  She is requesting some chicken noodle soup which is easier for her to swallow.  She wants to have an esophageal dilatation done before she is referred to a skilled nursing facility but limitations in doing this procedure with her low albumin have been discussed with her and she seems to understand.  She feels her food and "sits in her stomach" and does not go down like it should but denies any problems with dysphagia or odynophagia; she does not seem to have any problems with oropharyngeal transfer either.  Objective: Vital signs in last 24 hours: Temp:  [97.7 F (36.5 C)-98.7 F (37.1 C)] 98.2 F (36.8 C) (04/07 1536) Pulse Rate:  [68-115] 109 (04/07 1536) Resp:  [16-18] 18 (04/07 1536) BP: (94-112)/(56-80) 112/80 (04/07 1536) SpO2:  [90 %-99 %] 95 % (04/07 1536) Last BM Date: 12/23/19  Intake/Output from previous day: 04/06 0701 - 04/07 0700 In: 1868.1 [P.O.:1077; I.V.:791.1] Out: 528 [Urine:527; Stool:1] Intake/Output this shift: Total I/O In: 490 [P.O.:480; I.V.:10] Out: 5 [Urine:3; Stool:2]  General appearance: Emaciated elderly black female alert, cooperative, fatigued and no distress Resp: clear to auscultation bilaterally Cardio: regular rate and rhythm, S1, S2 normal, no murmur, click, rub or gallop GI: soft, non-tender; bowel sounds normal; no masses,  no organomegaly Extremities: extremities normal, atraumatic, no cyanosis or edema  Lab Results: Recent Labs    12/22/19 0412 12/23/19 0349  WBC 3.4* 4.1  HGB 8.0* 8.1*  HCT 24.8* 24.3*  PLT 130* 146*   BMET Recent Labs    12/22/19 1813 12/23/19 0349 12/24/19 0436  NA 127* 131* 132*  K 4.9 3.9 3.2*   CL 105 107 106  CO2 19* 19* 21*  GLUCOSE 99 148* 101*  BUN 25* 21 18  CREATININE 0.44 0.50 0.42*  CALCIUM 6.8* 7.0* 7.0*   LFT Recent Labs    12/24/19 0436  PROT 4.6*  ALBUMIN <1.0*  AST 18  ALT 18  ALKPHOS 68  BILITOT 0.4   Medications: I have reviewed the patient's current medications.  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  She will continue to try to supplement this with oral intake especially soups and protein drinks that she is able to swallow without any difficulty. I have discussed her case with Dr. Josiah Lobo today over the phone and requested that she be sent to skilled nursing facility as she is not able to take care of herself at home.  Dietary options have been discussed with the nurse today. 2)Gastroesophageal reflux disease on Pepcid and Protonix. 3) Anemia of chronic disease. 4) Rheumatoid arthritis. 5) Sacral wound.  LOS: 12 days   Deanna Cunningham 12/24/2019, 5:56 PM

## 2019-12-24 NOTE — Progress Notes (Addendum)
PHARMACY - TOTAL PARENTERAL NUTRITION CONSULT NOTE   Indication: malnutrition  Patient Measurements: Height: 5\' 4"  (162.6 cm) Weight: 46.5 kg (102 lb 8.2 oz) IBW/kg (Calculated) : 54.7 TPN AdjBW (KG): 42.2 Body mass index is 17.6 kg/m.  Assessment: 74 yo female initially presented on 3/26 for weakness. Patient with PMH of RA, chronic anemia and GERD  She has had ongoing poor intake since Dec 2020 with LE swelling and unintentional weight loss. W/U does not seem to find anything significant thus far, but continues to have inadequate intake  48 hour calorie count 4/3 - 4/5: inconsistent intake, not meeting goals  Glucose / Insulin: CBGs 100 - 148; 2 units SSI Electrolytes:  Na 132, K 3.9>3.2, Phos 2.5, Mg 1.6 Renal: SCr 0.42 LFTs / TGs: Trigs 87, LFTs WNL Prealbumin / albumin: Prealbumin 5, Albumin < 1 Intake / Output; MIVF: 2 stools, 2 urine occurrences + 527 mL UOP GI Imaging: 3/31 - barium swallow - unable to pass tablet - study shows esophageal dysmotility, dilation, and narrowing   4/1 CT abd pelvis - anasarca and ascites; likely underlying liver disease  Surgeries / Procedures: see above GI recommends EGD with dilation but pt needs improved nutritional status   Central access: PICC 4/1 TPN start date: 12/22/2019  Nutritional Goals (per RD recommendation on 4/2): kCal: 1600 - 1800 / day, Protein: 85 - 100 g/day, Fluid: > 1.6 L Goal TPN rate is 70 mL/hr (provides 55 g of protein and 1400 kcals per day) since pt is regularly taking the 3 prostat ordered daily  Goal concentrated TPN rate d/t pitting edema 50 mL/hr - will include same macros as above  Current Nutrition:  TPN Dysphagia 3 - but really only taking in some liquids (solid foods cause vomiting) Prostat TID - 100 cal, 15 g protein, 10 g carbs (each) Ensure ordered - unable to tolerate 4/5 - caused nausea  Plan:  Advance TPN to 50 mL/hr at 1800 - goal rate of concentrated TPN  Electrolytes in TPN: 75>>100 mEq/L  of Na, 50>>60 mEq/L of K, 62mEq/L of Ca, 28mEq/L of Mg, and 18 mmol/L of Phos. Cl:Ac ratio 1:2  Hold MVI and Trace in TPN as patient taking orally Continue Sensitive q6h SSI and adjust as needed Monitor TPN labs on Mon/Thurs  KCl IV 10 meq x 2 Mg 2 g x 1  Barth Kirks, PharmD, BCPS, BCCCP Clinical Pharmacist [phone]  Please check AMION for all [place] numbers  12/24/2019 7:29 AM

## 2019-12-24 NOTE — Progress Notes (Signed)
Occupational Therapy Treatment Patient Details Name: Deanna Cunningham MRN: [id number] DOB: April 05, 1946 Today's Date: 12/24/2019    History of present illness Pt is a 74 y.o. female admitted on 12/12/19 for progressive weakness, poor po intake, bil LE swelling.  Pt dx with severe protein calorie malnutrition (concern for malignancy), medical workup in progress.  Pt also dx with anemia acute on chronic, hypokalemia, hypomagnesemia, stage 1 sacral wound, QT prolongation, and hypocalcemia.  Pt with significant PMH of dysphagia (s/p EGD with esophageal dialation), CKD III, anemia, RA.     OT comments  Pt just finishing with PT upon OT arrival. Pt agreeable to OT session but was limited by decreased activity tolerance. Pt required modA for use of AE to assist with LB dressing. She was limited by decreased UE strength. HR up to 127 while sitting EOB using AE for LB dressing. Pt had full lunch tray uneaten on counter, pt reported she is unable to tolerate that level of puree and reports needing "gerber food". Pt will continue to benefit from skilled OT services to maximize safety and independence with ADL/IADL and functional mobility. Will continue to follow acutely and progress as tolerated.    Follow Up Recommendations  Supervision/Assistance - 24 hour;Home health OT    Equipment Recommendations  3 in 1 bedside commode;Tub/shower bench    Recommendations for Other Services      Precautions / Restrictions Precautions Precautions: Fall Precaution Comments: incontinent Restrictions Weight Bearing Restrictions: No       Mobility Bed Mobility Overal bed mobility: Needs Assistance Bed Mobility: Supine to Sit;Sit to Supine     Supine to sit: Min guard;HOB elevated Sit to supine: Min assist   General bed mobility comments: minA for BLE management with return to supine  Transfers Overall transfer level: Needs assistance Equipment used: Rolling walker (2 wheeled) Transfers: Sit to/from  Stand Sit to Stand: Mod assist         General transfer comment: mod assist to power up, bed raised,    Balance Overall balance assessment: Needs assistance Sitting-balance support: Feet supported Sitting balance-Leahy Scale: Fair     Standing balance support: Bilateral upper extremity supported;During functional activity Standing balance-Leahy Scale: Poor Standing balance comment: requires UE support                            ADL either performed or assessed with clinical judgement   ADL Overall ADL's : Needs assistance/impaired Eating/Feeding: Set up Eating/Feeding Details (indicate cue type and reason): pt had lunch tray on counter, she did not eat any of it and reported that she was unable to eat that thick of pureed foods                 Lower Body Dressing: Moderate assistance;Sit to/from stand Lower Body Dressing Details (indicate cue type and reason): modA to use sock aide and reacher for donning/doffing sock Toilet Transfer: Minimal assistance;RW;Ambulation           Functional mobility during ADLs: Moderate assistance;Rolling walker General ADL Comments: pt with decreased activity tolerance, HR up to 128 sitting EOB using AE to don/doff socks     Vision       Perception     Praxis      Cognition Arousal/Alertness: Awake/alert Behavior During Therapy: WFL for tasks assessed/performed Overall Cognitive Status: Within Functional Limits for tasks assessed  Exercises     Shoulder Instructions       General Comments Pt reports doing her exercises daily    Pertinent Vitals/ Pain       Pain Assessment: No/denies pain  Home Living                                          Prior Functioning/Environment              Frequency  Min 2X/week        Progress Toward Goals  OT Goals(current goals can now be found in the care plan section)  Progress  towards OT goals: Progressing toward goals  Acute Rehab OT Goals Patient Stated Goal: to be independent with AE OT Goal Formulation: With patient Time For Goal Achievement: 12/28/19 Potential to Achieve Goals: Good ADL Goals Pt Will Perform Grooming: with min assist;standing Pt Will Perform Upper Body Bathing: with set-up;sitting Pt Will Perform Lower Body Bathing: with min assist;sit to/from stand Pt Will Perform Upper Body Dressing: with set-up;with supervision;sitting Pt Will Perform Lower Body Dressing: with min assist;sit to/from stand;with adaptive equipment Pt Will Transfer to Toilet: with mod assist;ambulating;regular height toilet;bedside commode;grab bars Pt Will Perform Toileting - Clothing Manipulation and hygiene: with min assist;sit to/from stand Pt/caregiver will Perform Home Exercise Program: Increased strength;Right Upper extremity;Left upper extremity;With Supervision;With written HEP provided  Plan Discharge plan remains appropriate    Co-evaluation                 AM-PAC OT "6 Clicks" Daily Activity     Outcome Measure   Help from another person eating meals?: None Help from another person taking care of personal grooming?: A Little Help from another person toileting, which includes using toliet, bedpan, or urinal?: A Lot Help from another person bathing (including washing, rinsing, drying)?: A Lot Help from another person to put on and taking off regular upper body clothing?: A Little Help from another person to put on and taking off regular lower body clothing?: A Lot 6 Click Score: 16    End of Session Equipment Utilized During Treatment: Rolling walker  OT Visit Diagnosis: Unsteadiness on feet (R26.81);Repeated falls (R29.6);Muscle weakness (generalized) (M62.81);Pain Pain - Right/Left: Right Pain - part of body: (groin area)   Activity Tolerance Patient limited by fatigue   Patient Left in bed;with call bell/phone within reach;with bed alarm  set   Nurse Communication Mobility status        Time: [id number] OT Time Calculation (min): 17 min  Charges: OT General Charges $OT Visit: 1 Visit OT Treatments $Self Care/Home Management : 8-22 mins  Helene Kelp OTR/L Acute Rehabilitation Services Office: [place] 12/24/2019, 1:39 PM

## 2019-12-24 NOTE — Consult Note (Signed)
Reason for Consult:Severe malnutrition with poor oral intake Referring Physician: FPTS  [name] is an 74 y.o. female.  HPI: Reason for Consult: Severe malnutrition with poor oral intake Referring Physician: FPTS.  Deanna Cunningham is an 74 y.o. female.  HPI: Ms. Deanna Cunningham is a 74 year old black female with multiple medical problems listed below who was admitted to the [facility] emergency room on 12/12/2019 with generalized weakness and severe protein calorie malnutrition.  She has been having worsening lower extremity edema with unintentional weight loss and weight 93 pounds on admission.  She is also had some loose stools with fecal internal incontinence for the last month.  On admission she was found to be anemic with a hemoglobin of 8.5 potassium of 2.6 magnesium 1.6 and protein 5.4 with albumin of 1.4. 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  Even today she denies having any problems with difficulty swallowing but complains of an epigastric burning and discomfort that she has had after she eats.  She is requesting some chicken noodle soup for dinner tonight.  She has had a barium swallow earlier today that revealed a dilated esophagus with generalized dysmotility narrowing in the distal esophageal lumen. The last time she had this abnormality an EGD was done and the esophagus form was found to be widely patent and no dilatation was required is no stricture was noted in the distal esophagus.  However today the barium swallow did not pass into the stomach and it was thought that this may be related to the supine nature of the exam the lumen of the esophagus appeared somewhat larger than the diameter of the pill and there was no evidence of esophageal  ulceration. Since I last saw the patient in the office she has become very cachectic and weak. She had a colonoscopy in 2019 that showed extensive diverticular disease but the prep was very poor and the cecal base was not adequately visualized.  Patient has a hard time prepping for procedure and therefore this procedure was not repeated. [name] has been working full-time till about 4 to 5 weeks ago.  Her brother Dr. Brayton Caves, who is a vascular surgeon in [place] has been very actively involved in her care over the phone and making recommendations regarding her needs.  Patient clearly tells me today that she has had no problems swallowing the only GI issue she has is epigastric burning and pain and loose stools with fecal urgency and incontinence. Patient is requesting chicken noodle soup for dinner tonight along with crackers and she claims she has no difficulty swallowing the crackers as well.  Past Medical History:  Diagnosis Date  . Anemia   . Anemia in chronic renal disease   . Arthritis    rheumatoid  . CKD (chronic kidney disease), stage III   . Dysphagia   . GERD (gastroesophageal reflux disease)   . Osteopenia 04/2018   T score -2.0 FRAX 4% / 0.9%  . Renal disorder   . Secondary hyperparathyroidism, renal [facility])     Past Surgical History:  Procedure Laterality Date  . arthritis    . ESOPHAGOGASTRODUODENOSCOPY N/A 02/20/2014   Procedure: ESOPHAGOGASTRODUODENOSCOPY (EGD);  Surgeon: [name], MD;  Location: [facility];  Service:  Gastroenterology;  Laterality: N/A;  . ESOPHAGOGASTRODUODENOSCOPY (EGD) WITH ESOPHAGEAL DILATION      Family History  Problem Relation Age of Onset  . Congestive Heart Failure Mother   . Diabetes Mother   . Hypertension Mother   . Diabetes Sister   . Hypertension Sister   . Cancer Brother        tumor removed from bladder  . Diabetes Brother   . Hypertension Brother    Social History:  reports that she has never smoked. She has never used  smokeless tobacco. She reports that she does not drink alcohol or use drugs.  Allergies:  Allergies  Allergen Reactions  . Lactose Intolerance (Gi)    Medications: I have reviewed the patient's current medications.  Results for orders placed or performed during the hospital encounter of 12/12/19 (from the past 48 hour(s))  Glucose, capillary     Status: Abnormal   Collection Time: 12/22/19  6:01 PM  Result Value Ref Range   Glucose-Capillary 35 (LL) 70 - 99 mg/dL    Comment: Glucose reference range applies only to samples taken after fasting for at least 8 hours.   Comment 1 Document in Chart   Basic metabolic panel     Status: Abnormal   Collection Time: 12/22/19  6:13 PM  Result Value Ref Range   Sodium 127 (L) 135 - 145 mmol/L    Comment: DELTA CHECK NOTED   Potassium 4.9 3.5 - 5.1 mmol/L    Comment: DELTA CHECK NOTED NO VISIBLE HEMOLYSIS    Chloride 105 98 - 111 mmol/L   CO2 19 (L) 22 - 32 mmol/L   Glucose, Bld 99 70 - 99 mg/dL    Comment: Glucose reference range applies only to samples taken after fasting for at least 8 hours.   BUN 25 (H) 8 - 23 mg/dL   Creatinine, Ser 0.44 0.44 - 1.00 mg/dL   Calcium 6.8 (L) 8.9 - 10.3 mg/dL   GFR calc non Af Amer >60 >60 mL/min   GFR calc Af Amer >60 >60 mL/min   Anion gap 3 (L) 5 - 15    Comment: Performed at [facility] [address] [place], [place] [address]  Magnesium     Status: None   Collection Time: 12/22/19  6:13 PM  Result Value Ref Range   Magnesium 2.2 1.7 - 2.4 mg/dL    Comment: Performed at [facility], [place] [address] [place],  [address]  Phosphorus     Status: None   Collection Time: 12/22/19  6:13 PM  Result Value Ref Range   Phosphorus 2.9 2.5 - 4.6 mg/dL    Comment: Performed at [facility] [address] [place], [place] [address]  Glucose, capillary     Status: Abnormal   Collection Time: 12/22/19  6:38 PM  Result Value Ref Range   Glucose-Capillary 49 (L) 70 - 99 mg/dL     Comment: Glucose reference range applies only to samples taken after fasting for at least 8 hours.  Glucose, capillary     Status: Abnormal   Collection Time: 12/22/19  8:03 PM  Result Value Ref Range   Glucose-Capillary 61 (L) 70 - 99 mg/dL    Comment: Glucose reference range applies only to samples taken after fasting for at least 8 hours.  Glucose, capillary     Status: None   Collection Time: 12/22/19  9:11 PM  Result Value Ref Range   Glucose-Capillary 80 70 -  99 mg/dL    Comment: Glucose reference range applies only to samples taken after fasting for at least 8 hours.  Glucose, capillary     Status: Abnormal   Collection Time: 12/23/19 12:14 AM  Result Value Ref Range   Glucose-Capillary 137 (H) 70 - 99 mg/dL    Comment: Glucose reference range applies only to samples taken after fasting for at least 8 hours.  Comprehensive metabolic panel     Status: Abnormal   Collection Time: 12/23/19  3:49 AM  Result Value Ref Range   Sodium 131 (L) 135 - 145 mmol/L   Potassium 3.9 3.5 - 5.1 mmol/L   Chloride 107 98 - 111 mmol/L   CO2 19 (L) 22 - 32 mmol/L   Glucose, Bld 148 (H) 70 - 99 mg/dL    Comment: Glucose reference range applies only to samples taken after fasting for at least 8 hours.   BUN 21 8 - 23 mg/dL   Creatinine, Ser 0.50 0.44 - 1.00 mg/dL   Calcium 7.0 (L) 8.9 - 10.3 mg/dL   Total Protein 4.6 (L) 6.5 - 8.1 g/dL   Albumin <1.0 (L) 3.5 - 5.0 g/dL   AST 21 15 - 41 U/L   ALT 19 0 - 44 U/L   Alkaline Phosphatase 75 38 - 126 U/L   Total Bilirubin 0.3 0.3 - 1.2 mg/dL   GFR calc non Af Amer >60 >60 mL/min   GFR calc Af Amer >60 >60 mL/min   Anion gap 5 5 - 15    Comment: Performed at [facility] [address], [place], [place] [address]  Magnesium     Status: None   Collection Time: 12/23/19  3:49 AM  Result Value Ref Range   Magnesium 1.9 1.7 - 2.4 mg/dL    Comment: Performed at [facility] [address] [place], [place] [address]  Phosphorus      Status: Abnormal   Collection Time: 12/23/19  3:49 AM  Result Value Ref Range   Phosphorus 2.4 (L) 2.5 - 4.6 mg/dL    Comment: Performed at [facility] [address], [place], [place] [address]  CBC     Status: Abnormal   Collection Time: 12/23/19  3:49 AM  Result Value Ref Range   WBC 4.1 4.0 - 10.5 K/uL   RBC 2.57 (L) 3.87 - 5.11 MIL/uL   Hemoglobin 8.1 (L) 12.0 - 15.0 g/dL   HCT 24.3 (L) 36.0 - 46.0 %   MCV 94.6 80.0 - 100.0 fL   MCH 31.5 26.0 - 34.0 pg   MCHC 33.3 30.0 - 36.0 g/dL   RDW 15.6 (H) 11.5 - 15.5 %   Platelets 146 (L) 150 - 400 K/uL   nRBC 0.0 0.0 - 0.2 %    Comment: Performed at [facility], [place] [address] [place], [place] [address]  Glucose, capillary     Status: Abnormal   Collection Time: 12/23/19  4:15 AM  Result Value Ref Range   Glucose-Capillary 135 (H) 70 - 99 mg/dL    Comment: Glucose reference range applies only to samples taken after fasting for at least 8 hours.  Glucose, capillary     Status: Abnormal   Collection Time: 12/23/19  5:56 AM  Result Value Ref Range   Glucose-Capillary 118 (H) 70 - 99 mg/dL    Comment: Glucose reference range applies only to samples taken after fasting for at least 8 hours.  Glucose, capillary  Status: Abnormal   Collection Time: 12/23/19 11:57 AM  Result Value Ref Range   Glucose-Capillary 123 (H) 70 - 99 mg/dL    Comment: Glucose reference range applies only to samples taken after fasting for at least 8 hours.  Glucose, capillary     Status: Abnormal   Collection Time: 12/23/19  6:18 PM  Result Value Ref Range   Glucose-Capillary 148 (H) 70 - 99 mg/dL    Comment: Glucose reference range applies only to samples taken after fasting for at least 8 hours.  Glucose, capillary     Status: Abnormal   Collection Time: 12/23/19 11:38 PM  Result Value Ref Range   Glucose-Capillary 150 (H) 70 - 99 mg/dL    Comment: Glucose reference range applies only to samples taken after fasting for at least 8 hours.   Comprehensive metabolic panel     Status: Abnormal   Collection Time: 12/24/19  4:36 AM  Result Value Ref Range   Sodium 132 (L) 135 - 145 mmol/L   Potassium 3.2 (L) 3.5 - 5.1 mmol/L   Chloride 106 98 - 111 mmol/L   CO2 21 (L) 22 - 32 mmol/L   Glucose, Bld 101 (H) 70 - 99 mg/dL    Comment: Glucose reference range applies only to samples taken after fasting for at least 8 hours.   BUN 18 8 - 23 mg/dL   Creatinine, Ser 0.42 (L) 0.44 - 1.00 mg/dL   Calcium 7.0 (L) 8.9 - 10.3 mg/dL   Total Protein 4.6 (L) 6.5 - 8.1 g/dL   Albumin <1.0 (L) 3.5 - 5.0 g/dL   AST 18 15 - 41 U/L   ALT 18 0 - 44 U/L   Alkaline Phosphatase 68 38 - 126 U/L   Total Bilirubin 0.4 0.3 - 1.2 mg/dL   GFR calc non Af Amer >60 >60 mL/min   GFR calc Af Amer >60 >60 mL/min   Anion gap 5 5 - 15    Comment: Performed at [facility] [address], [place], [place] [address]  Magnesium     Status: Abnormal   Collection Time: 12/24/19  4:36 AM  Result Value Ref Range   Magnesium 1.6 (L) 1.7 - 2.4 mg/dL    Comment: Performed at [facility] [address], [place], [place] [address]  Phosphorus     Status: None   Collection Time: 12/24/19  4:36 AM  Result Value Ref Range   Phosphorus 2.5 2.5 - 4.6 mg/dL    Comment: Performed at [facility] [address], [place], [place] [address]  Glucose, capillary     Status: Abnormal   Collection Time: 12/24/19  6:08 AM  Result Value Ref Range   Glucose-Capillary 100 (H) 70 - 99 mg/dL    Comment: Glucose reference range applies only to samples taken after fasting for at least 8 hours.  Glucose, capillary     Status: None   Collection Time: 12/24/19 11:39 AM  Result Value Ref Range   Glucose-Capillary 97 70 - 99 mg/dL    Comment: Glucose reference range applies only to samples taken after fasting for at least 8 hours.   Review of Systems Blood pressure 112/80, pulse (!) 109, temperature 98.2 F (36.8 C), temperature source Oral, resp. rate 18, height  5\' 4"  (1.626 m), weight 46.5 kg, SpO2 95 %.  Constitutional: Positive for activity change, appetite change, fatigue and unexpected weight change. Negative for chills, diaphoresis and fever.  HENT: Negative.  Eyes: Negative.   Respiratory: Negative.   Cardiovascular: Positive for leg swelling. Negative for chest pain and palpitations.  Gastrointestinal: Positive for constipation and diarrhea. Negative for abdominal distention, abdominal pain, anal bleeding, blood in stool, nausea, rectal pain and vomiting.  Endocrine: Negative.   Genitourinary: Negative.   Musculoskeletal: Positive for arthralgias, back pain, gait problem, joint swelling, myalgias, neck pain and neck stiffness Physical Exam Constitutional: She appears cachectic. She is active and cooperative. She appears toxic. No distress.  HENT:  Head: Normocephalic and atraumatic.  Eyes: Lids are normal.  Neck: Thyroid mass present. No thyromegaly present.  Cardiovascular: Normal rate and regular rhythm.  Respiratory: Effort normal.  GI: Soft. Normal appearance.  Neurological: She is alert.  Psychiatric: Her speech is normal and behavior is normal. Judgment and thought content normal. Her mood appears anxious. Cognition and memory are normal. 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  As per my discussion with the nurse I will plan to start full liquids and see how she tolerates this.  If she has dysphagia with getting full liquids repeat EGD will be done. This has not been scheduled for tomorrow she just had a barium study today and with barium in the esophagus it would be difficult to visualize the mucosa. As per my discussion with her brother Dr. Brayton Caves, I do not feel she is a candidate for TPN or a PICC line as this is not the long-term answer for problem like hers. I have discussed  her entire medical history with him in great detail including her severe spinal degeneration and her limited mobility due to her severe lower extremity edema and her presbyesophagus.  I would discourage the use of calcium carbonate as this can sometimes worsen reflux and add famotidine to 40 mg twice daily along with pantoprazole that she is already receiving. She might also benefit from sucralfate suspension 1 g 4 times daily in between meals and at bedtime. 2) Chronic constipation complicated by overflow incontinence. 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  4) Anemia of chronic disease-patient is receiving a blood transfusion today. 5) Severe protein calorie malnutrition with electrolyte abnormalities and abnormal weight loss-a CT scan of the abdomen and pelvis may be helpful. 6) CODE STATUS needs to be discussed. I have informed the patient that due to her multiple medical issues and her severely malnourished baseline status there is not much intervention we can do from a GI standpoint.  She both she and her brother are very understanding of this and desire that she be kept comfortable if not much can be done for her. Juanita Craver 12/17/2019, 4:40 PM   Juanita Craver 12/24/2019, 5:49 PM

## 2019-12-25 DIAGNOSIS — E876 Hypokalemia: Secondary | ICD-10-CM | POA: Diagnosis not present

## 2019-12-25 DIAGNOSIS — E8809 Other disorders of plasma-protein metabolism, not elsewhere classified: Secondary | ICD-10-CM | POA: Diagnosis not present

## 2019-12-25 DIAGNOSIS — E43 Unspecified severe protein-calorie malnutrition: Secondary | ICD-10-CM | POA: Diagnosis present

## 2019-12-25 DIAGNOSIS — K224 Dyskinesia of esophagus: Secondary | ICD-10-CM | POA: Diagnosis not present

## 2019-12-25 LAB — CBC WITH DIFFERENTIAL/PLATELET
Abs Immature Granulocytes: 0.05 10*3/uL (ref 0.00–0.07)
Basophils Absolute: 0 10*3/uL (ref 0.0–0.1)
Basophils Relative: 1 %
Eosinophils Absolute: 0.1 10*3/uL (ref 0.0–0.5)
Eosinophils Relative: 1 %
HCT: 22.8 % — ABNORMAL LOW (ref 36.0–46.0)
Hemoglobin: 7.5 g/dL — ABNORMAL LOW (ref 12.0–15.0)
Immature Granulocytes: 1 %
Lymphocytes Relative: 15 %
Lymphs Abs: 0.6 10*3/uL — ABNORMAL LOW (ref 0.7–4.0)
MCH: 31.1 pg (ref 26.0–34.0)
MCHC: 32.9 g/dL (ref 30.0–36.0)
MCV: 94.6 fL (ref 80.0–100.0)
Monocytes Absolute: 0.3 10*3/uL (ref 0.1–1.0)
Monocytes Relative: 9 %
Neutro Abs: 2.8 10*3/uL (ref 1.7–7.7)
Neutrophils Relative %: 73 %
Platelets: 158 10*3/uL (ref 150–400)
RBC: 2.41 MIL/uL — ABNORMAL LOW (ref 3.87–5.11)
RDW: 15.4 % (ref 11.5–15.5)
WBC: 3.8 10*3/uL — ABNORMAL LOW (ref 4.0–10.5)
nRBC: 0 % (ref 0.0–0.2)

## 2019-12-25 LAB — COMPREHENSIVE METABOLIC PANEL
ALT: 18 U/L (ref 0–44)
AST: 18 U/L (ref 15–41)
Albumin: 1.1 g/dL — ABNORMAL LOW (ref 3.5–5.0)
Alkaline Phosphatase: 70 U/L (ref 38–126)
Anion gap: 6 (ref 5–15)
BUN: 17 mg/dL (ref 8–23)
CO2: 21 mmol/L — ABNORMAL LOW (ref 22–32)
Calcium: 7 mg/dL — ABNORMAL LOW (ref 8.9–10.3)
Chloride: 104 mmol/L (ref 98–111)
Creatinine, Ser: 0.44 mg/dL (ref 0.44–1.00)
GFR calc Af Amer: >60 mL/min (ref >60)
GFR calc non Af Amer: >60 mL/min (ref >60)
Glucose, Bld: 124 mg/dL — ABNORMAL HIGH (ref 70–99)
Potassium: 3.1 mmol/L — ABNORMAL LOW (ref 3.5–5.1)
Sodium: 131 mmol/L — ABNORMAL LOW (ref 135–145)
Total Bilirubin: 0.1 mg/dL — ABNORMAL LOW (ref 0.3–1.2)
Total Protein: 4.7 g/dL — ABNORMAL LOW (ref 6.5–8.1)

## 2019-12-25 LAB — GLUCOSE, CAPILLARY
Glucose-Capillary: 100 mg/dL — ABNORMAL HIGH (ref 70–99)
Glucose-Capillary: 100 mg/dL — ABNORMAL HIGH (ref 70–99)
Glucose-Capillary: 111 mg/dL — ABNORMAL HIGH (ref 70–99)
Glucose-Capillary: 131 mg/dL — ABNORMAL HIGH (ref 70–99)

## 2019-12-25 LAB — MAGNESIUM: Magnesium: 1.9 mg/dL (ref 1.7–2.4)

## 2019-12-25 LAB — PHOSPHORUS: Phosphorus: 2.5 mg/dL (ref 2.5–4.6)

## 2019-12-25 LAB — VITAMIN B1: Vitamin B1 (Thiamine): 56 nmol/L — ABNORMAL LOW (ref 66.5–200.0)

## 2019-12-25 MED ORDER — INSULIN ASPART 100 UNIT/ML ~~LOC~~ SOLN: 0.0000 [IU] | Freq: Two times a day (BID) | SUBCUTANEOUS | Status: DC

## 2019-12-25 MED ORDER — STERILE WATER FOR INJECTION IV SOLN
INTRAVENOUS | Status: AC
  Filled 2019-12-25: qty 366.4

## 2019-12-25 MED ORDER — POTASSIUM CHLORIDE 10 MEQ/50ML IV SOLN
10.0000 meq | INTRAVENOUS | Status: AC
  Administered 2019-12-25 (×3): 10 meq via INTRAVENOUS
  Filled 2019-12-25 (×3): qty 50

## 2019-12-25 NOTE — Plan of Care (Signed)

## 2019-12-25 NOTE — Progress Notes (Signed)
Follow up vital signs resulted: 97.8, 103/55, 97, 18 respirations, 95% pox on room air. Day shift Nurse updated.

## 2019-12-25 NOTE — Progress Notes (Addendum)
PHARMACY - TOTAL PARENTERAL NUTRITION CONSULT NOTE   Indication: malnutrition  Patient Measurements: Height: 5\' 4"  (162.6 cm) Weight: 46.5 kg (102 lb 8.2 oz) IBW/kg (Calculated) : 54.7 TPN AdjBW (KG): 42.2 Body mass index is 17.6 kg/m.  Assessment: 74 yo female initially presented on 3/26 for weakness. Patient with PMH of RA, chronic anemia and GERD  She has had ongoing poor intake since Dec 2020 with LE swelling and unintentional weight loss. W/U does not seem to find anything significant thus far, but continues to have inadequate intake  48 hour calorie count 4/3 - 4/5: inconsistent intake, not meeting goals  Glucose / Insulin: CBGs 97 - 128; 2 units SSI Electrolytes:  Na 131, K 3.1, Phos 2.5, Mg 1.9 Renal: SCr 0.42 LFTs / TGs: Trigs 87, LFTs WNL Prealbumin / albumin: Prealbumin 5, Albumin 1.1 Intake / Output; MIVF: 2 stools, 4 urine occurrences + 1303 mL UOP 1400 mL on oral intake GI Imaging: 3/31 - barium swallow - unable to pass tablet - study shows esophageal dysmotility, dilation, and narrowing   4/1 CT abd pelvis - anasarca and ascites; likely underlying liver disease  Surgeries / Procedures: see above GI recommends EGD with dilation but pt needs improved nutritional status   Central access: PICC 4/1 TPN start date: 12/22/2019  Nutritional Goals (per RD recommendation on 4/2): kCal: 1600 - 1800 / day, Protein: 85 - 100 g/day, Fluid: > 1.6 L Goal TPN rate is 70 mL/hr (provides 55 g of protein and 1400 kcals per day) since pt is regularly taking the 3 prostat ordered daily  Goal concentrated TPN rate d/t pitting edema 50 mL/hr - will include same macros as above  Current Nutrition:  TPN Dysphagia 3 - but really only taking in some liquids (solid foods cause vomiting) Prostat TID - 100 cal, 15 g protein, 10 g carbs (each) - only 2 taken on 4/7 Ensure ordered - unable to tolerate 4/5 - caused nausea  Plan:  Continue TPN at 50 mL/hr at 1800  Electrolytes in TPN:  100>>150 mEq/L of Na, 60>>70 mEq/L of K, 2mEq/L of Ca, 5>7 mEq/L of Mg, and 18 mmol/L of Phos. Cl:Ac ratio 1:2  Hold MVI and Trace in TPN as patient taking orally Continue Sensitive q6h SSI and adjust as needed Monitor TPN labs on Mon/Thurs, Bmet in AM  KCl IV 10 meq x 3  Barth Kirks, PharmD, BCPS, BCCCP Clinical Pharmacist [phone]  Please check AMION for all [facility] numbers  12/25/2019 8:15 AM

## 2019-12-25 NOTE — Progress Notes (Addendum)
Per [place] Protocol, Patient with [place] due to Elevated Pulse 019-119 and B/P at 95/63. Patient with similar vital signs noted during  prior day shift. Pt with no s/s of distress. CBG at 100, pox sat 93 on room air. Respirations noted at 16.    [place] Protocol initiated, Dr. Erin Hearing notified, follow up vitals due at 07:00am today. Charge Nurse Roni made aware.

## 2019-12-25 NOTE — Progress Notes (Signed)
Family Medicine Teaching Service Daily Progress Note Intern Pager: [phone]  Patient name: Deanna Cunningham Medical record number: [id number] Date of birth: Nov 04, 1945 Age: 74 y.o. Gender: female  Primary Care Provider: Kristie Cowman, MD Consultants: None, consider reaching out to patient's orthopedist Code Status: Full code  Pt Overview and Major Events to Date:  3/26-patient admitted for weakness, malnutrition   Assessment and Plan: Deanna Cunningham is a 74 y.o. female presenting with weakness. PMH is significant for Rheumatoid Arthritis (no tx, no rheumatologist), chronic anemia and GERD.  Underweight, severe protein calorie malnutrition: stable. Tolerating liquid diet and TPN well, continues to have difficulty with soft consistencies. Continue to monitor for refeeding syndrome.  -Dietitian on board, would appreciate recommendations -Continue MVI, 30 mL prostat TID, and TPN -GI on board, appreciate recommendations -Daily weights, strict I&O -Work with social work/PT/OT for disposition   Esophageal dysmotility: Stable. Evident on 3/31 barium swallow. Unable to tolerated dysphagia diet. TPN started 4/5 -Monitor mag, Phos due to refeeding syndrome and replete as appropriate per pharmacy  Normocytic, hypochromic anemia: Chronic, currently stable. Last Hgb 7.5, around baseline. 1 U RBCs total during admit.  -Heme/onc consulted and appreciate recommendations, will follow outpatient -f/u AM CBC  GERD: Chronic, improved. Minimal symptoms on Pepcid and Protonix. -Famotidine 20mg  BID -Protonix 40 mg -Sucralfate with meals/bedtime  Oral candidiasis: Resolved. -Magic mouthwash    Sacral wound: stable.  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 Follows with [facility], Dr. Gardiner Rhyme, outpatient.  -Avoid QT prolonging  medications  Rheumatoid arthritis Ulnar deviation of MCP joints bilaterally. Does not have Rheumatologist and is not on DMARDs. -Monitor pain   -Ambulatory referral to Rheum on discharge   FEN/GI: Dysphagia 3, replete electrolytes as appropriate Prophylaxis: Encourage ambulation, SCDs   Disposition: SNF pending continued medical management per GI  Subjective:  Feels stronger. Not tolerating certain consistencies of the soft diet well.   Objective: Temp:  [97.7 F (36.5 C)-98.2 F (36.8 C)] 97.8 F (36.6 C) (04/08 0702) Pulse Rate:  [68-120] 97 (04/08 0702) Resp:  [18-20] 18 (04/08 0702) BP: (94-112)/(53-80) 103/55 (04/08 0702) SpO2:  [90 %-100 %] 95 % (04/08 0702)   Physical Exam:  General: Appears unwell yet improving, no acute distress. Age appropriate. Cardiac: RRR, normal heart sounds, no murmurs Respiratory: CTAB, normal effort Abdomen: soft, nontender, non distended, +BS Extremities: LE pitting edema or cyanosis.  Laboratory: Recent Labs  Lab 12/22/19 0412 12/23/19 0349 12/25/19 0423  WBC 3.4* 4.1 3.8*  HGB 8.0* 8.1* 7.5*  HCT 24.8* 24.3* 22.8*  PLT 130* 146* 158   Recent Labs  Lab 12/23/19 0349 12/24/19 0436 12/25/19 0423  NA 131* 132* 131*  K 3.9 3.2* 3.1*  CL 107 106 104  CO2 19* 21* 21*  BUN 21 18 17   CREATININE 0.50 0.42* 0.44  CALCIUM 7.0* 7.0* 7.0*  PROT 4.6* 4.6* 4.7*  BILITOT 0.3 0.4 0.1*  ALKPHOS 75 68 70  ALT 19 18 18   AST 21 18 18   GLUCOSE 148* 101* 124*    Imaging/Diagnostic Tests: No new imaging  Gerlene Fee, DO 12/25/2019, 8:03 AM PGY-1, [facility] Intern pager: [phone], text pages welcome

## 2019-12-25 NOTE — Progress Notes (Signed)
Nutrition Follow-up  DOCUMENTATION CODES:   Underweight, Severe malnutrition in context of chronic illness  INTERVENTION:   -ContinueBoost Breeze po TID, each supplement provides 250 kcal and 9 grams of protein -Continue 30 ml Prostat TID, each supplement provides 100 kcals and 15 grams protein -TPN management per pharmacy  NUTRITION DIAGNOSIS:   Severe Malnutrition related to decreased appetite as evidenced by energy intake < or equal to 75% for > or equal to 1 month, severe fat depletion, moderate muscle depletion, severe muscle depletion.  Ongoing  GOAL:   Patient will meet greater than or equal to 90% of their needs  Met  MONITOR:   PO intake, Supplement acceptance, Diet advancement, Labs, Weight trends, Skin, I & O's  REASON FOR ASSESSMENT:   Consult Calorie Count  ASSESSMENT:   Deanna Cunningham is a 74 y.o. female presenting with weakness. PMH is significant for Rheumatoid Arthritis (no tx, no rheumatologist), chronic anemia and GERD.  4/2- PICC placed 4/5- TPN initiated  Reviewed I/O's: +876 ml x 24 hours and +9.2 L since admission  UOP: 1.3 L x 24 hours  Pt in with MD at time of visit.   Pt receiving TPN at 50 ml/hr, which provides 1444 kcals and 55 grams protein, meeting 83% of estimated kcal needs and 61% of estimated protein needs. Per pharmacy note, plan to continue same TPN regimen at 1800.   Pt remains with variable intake; noted meal completion 0-100%. Pt also taking supplements most of the time (consumes 3 Boost Breeze supplements and 2 Prostat supplements yesterday).   Over the past 24 hours, pt has received  4052 kcals and 197 grams protein (TPN + po intake wit meals and supplements), meeting > 100% of estimated kcal and protein needs.  Reviewed wt since admission. Pt has gained 4.3# (10%) over the past week, which is favorable given pt's malnutrition.   Labs reviewed: Na: 131, K: 3.1 (on IV supplementation), Phos and Mg WDL. CBGS: 97-131  (inpatient orders for glycemic control are 0-9 units insulin aspart every 12 hours).   Diet Order:   Diet Order            DIET - DYS 1 Room service appropriate? Yes with Assist; Fluid consistency: Thin  Diet effective now              EDUCATION NEEDS:   Education needs have been addressed  Skin:  Skin Assessment: Skin Integrity Issues: Skin Integrity Issues:: Stage II Stage II: coccyx Other: MASD to groin, labia, buttocks, and sacrum  Last BM:  12/24/19  Height:   Ht Readings from Last 1 Encounters:  12/13/19 _0  (1.626 m)    Weight:   Wt Readings from Last 1 Encounters:  12/23/19 46.5 kg    Ideal Body Weight:  54.5 kg  BMI:  Body mass index is 17.6 kg/m.  Estimated Nutritional Needs:   Kcal:  1121-6244  Protein:  90-105 grams  Fluid:  > 1.7 L    [name], RD, LDN, [place] Registered Dietitian II Certified Diabetes Care and Education Specialist Please refer to [facility] for RD and/or RD on-call/weekend/after hours pager

## 2019-12-26 DIAGNOSIS — K224 Dyskinesia of esophagus: Secondary | ICD-10-CM | POA: Diagnosis not present

## 2019-12-26 DIAGNOSIS — E43 Unspecified severe protein-calorie malnutrition: Secondary | ICD-10-CM | POA: Diagnosis not present

## 2019-12-26 DIAGNOSIS — E8809 Other disorders of plasma-protein metabolism, not elsewhere classified: Secondary | ICD-10-CM | POA: Diagnosis not present

## 2019-12-26 LAB — CBC
HCT: 20.3 % — ABNORMAL LOW (ref 36.0–46.0)
Hemoglobin: 6.7 g/dL — CL (ref 12.0–15.0)
MCH: 31.8 pg (ref 26.0–34.0)
MCHC: 33 g/dL (ref 30.0–36.0)
MCV: 96.2 fL (ref 80.0–100.0)
Platelets: 187 10*3/uL (ref 150–400)
RBC: 2.11 MIL/uL — ABNORMAL LOW (ref 3.87–5.11)
RDW: 16.2 % — ABNORMAL HIGH (ref 11.5–15.5)
WBC: 4.4 10*3/uL (ref 4.0–10.5)
nRBC: 0 % (ref 0.0–0.2)

## 2019-12-26 LAB — COMPREHENSIVE METABOLIC PANEL
ALT: 20 U/L (ref 0–44)
AST: 22 U/L (ref 15–41)
Albumin: 1.1 g/dL — ABNORMAL LOW (ref 3.5–5.0)
Alkaline Phosphatase: 73 U/L (ref 38–126)
Anion gap: 7 (ref 5–15)
BUN: 18 mg/dL (ref 8–23)
CO2: 24 mmol/L (ref 22–32)
Calcium: 7.1 mg/dL — ABNORMAL LOW (ref 8.9–10.3)
Chloride: 106 mmol/L (ref 98–111)
Creatinine, Ser: 0.45 mg/dL (ref 0.44–1.00)
GFR calc Af Amer: >60 mL/min (ref >60)
GFR calc non Af Amer: >60 mL/min (ref >60)
Glucose, Bld: 106 mg/dL — ABNORMAL HIGH (ref 70–99)
Potassium: 3.6 mmol/L (ref 3.5–5.1)
Sodium: 137 mmol/L (ref 135–145)
Total Bilirubin: 0.2 mg/dL — ABNORMAL LOW (ref 0.3–1.2)
Total Protein: 4.4 g/dL — ABNORMAL LOW (ref 6.5–8.1)

## 2019-12-26 LAB — PHOSPHORUS: Phosphorus: 2.1 mg/dL — ABNORMAL LOW (ref 2.5–4.6)

## 2019-12-26 LAB — PREPARE RBC (CROSSMATCH)

## 2019-12-26 LAB — MAGNESIUM: Magnesium: 1.7 mg/dL (ref 1.7–2.4)

## 2019-12-26 LAB — GLUCOSE, CAPILLARY
Glucose-Capillary: 118 mg/dL — ABNORMAL HIGH (ref 70–99)
Glucose-Capillary: 98 mg/dL (ref 70–99)
Glucose-Capillary: 102 mg/dL — ABNORMAL HIGH (ref 70–99)
Glucose-Capillary: 103 mg/dL — ABNORMAL HIGH (ref 70–99)
Glucose-Capillary: 105 mg/dL — ABNORMAL HIGH (ref 70–99)

## 2019-12-26 MED ORDER — POTASSIUM PHOSPHATES 15 MMOLE/5ML IV SOLN
15.0000 mmol | Freq: Once | INTRAVENOUS | Status: AC
  Administered 2019-12-26: 15 mmol via INTRAVENOUS
  Filled 2019-12-26: qty 5

## 2019-12-26 MED ORDER — MAGNESIUM SULFATE IN D5W 1-5 GM/100ML-% IV SOLN
1.0000 g | Freq: Once | INTRAVENOUS | Status: AC
  Administered 2019-12-26: 1 g via INTRAVENOUS
  Filled 2019-12-26: qty 100

## 2019-12-26 MED ORDER — SODIUM CHLORIDE 0.9% IV SOLUTION: Freq: Once | INTRAVENOUS | Status: AC

## 2019-12-26 MED ORDER — STERILE WATER FOR INJECTION IV SOLN
INTRAVENOUS | Status: AC
  Filled 2019-12-26: qty 293.12

## 2019-12-26 NOTE — Plan of Care (Signed)
  Problem: Nutrition: Goal: Adequate nutrition will be maintained Outcome: Progressing   Problem: Safety: Goal: Ability to remain free from injury will improve Outcome: Progressing   

## 2019-12-26 NOTE — Progress Notes (Signed)
Dr Gwendlyn Deutscher was sent a secure epic message regarding a critical Hgb 6.7

## 2019-12-26 NOTE — Progress Notes (Signed)
Patient's brother, Dr. Fogle, requesting primary team to consider starting Epogen for recurrent anemia.   Earlier during her hospital stay we consulted hematology/oncology for evaluation of her chronic anemia and thrombocytopenia.  She had last seen hematology/oncology in 2014 with some concern that she had myelodysplastic syndrome, however she did not follow-up with a bone marrow biopsy.  Heme-onc, Dr. Sherrill, felt that her anemia is likely multifactorial in the setting of severe protein calorie malnutrition and chronic disease of rheumatoid arthritis.  Recommended outpatient follow-up for further investigation with possible bone marrow biopsy.  Touched base with Dr. Shahad, heme/onc this afternoon to discuss indications for Epogen.  He endorsed that Epogen would not be acutely indicated at this time and to continue with transfusions as needed.  Could be considered in the future if outpatient work-up deems this necessary.  We will also continue to monitor for alternative etiologies of acute on chronic anemia.  Samantha N Beard, DO 

## 2019-12-26 NOTE — Progress Notes (Signed)
Blood bank called - 1 unit ready for infusion.

## 2019-12-26 NOTE — Progress Notes (Signed)
PT Cancellation Note  Patient Details Name: Deanna Cunningham MRN: [id number] DOB: 1946/07/08   Cancelled Treatment:    Reason Eval/Treat Not Completed: (P) Medical issues which prohibited therapy(HGB dropped to 6.7 will defer tx at this time. Will f/u per POC pending medical appropriateness.)   Rosalva Neary Eli Hose 12/26/2019, 4:39 PM Erasmo Leventhal , PTA Acute Rehabilitation Services Pager [phone] Office [phone]

## 2019-12-26 NOTE — Progress Notes (Signed)
Family Medicine Teaching Service Daily Progress Note Intern Pager: [phone]  Patient name: Deanna Cunningham Medical record number: [id number] Date of birth: 1946-03-11 Age: 74 y.o. Gender: female  Primary Care Provider: Kristie Cowman, MD Consultants: None, consider reaching out to patient's orthopedist Code Status: Full code  Pt Overview and Major Events to Date:  3/26-patient admitted for weakness, malnutrition   Assessment and Plan: Deanna Cunningham is a 74 y.o. female presenting with weakness. PMH is significant for Rheumatoid Arthritis (no tx, no rheumatologist), chronic anemia and GERD.  Underweight, severe protein calorie malnutrition: stable. Tolerating liquid and soft diet, as well as TPN well. Gaining weight appropriately. Continue to monitor for refeeding syndrome. Albumin 1.1 -Dietitian on board, would appreciate recommendations -Continue MVI, 30 mL prostat TID, and TPN -GI on board, appreciate recommendations -Daily weights, strict I&O -Work with social work/PT/OT for disposition   Esophageal dysmotility: Stable. -Monitor mag, Phos due to refeeding syndrome and replete as appropriate per pharmacy  Normocytic, hypochromic anemia: Chronic, currently stable. Last Hgb 7.5, around baseline. Transfusion threshold <7.  -Heme/onc consulted and appreciate recommendations, will follow outpatient -f/u AM CBC q48h  GERD: Chronic, improved. Minimal symptoms on Pepcid and Protonix. -Famotidine 20mg  BID -Protonix 40 mg -Sucralfate with meals/bedtime  Oral candidiasis: Resolved. -Magic mouthwash    Sacral wound: stable.  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 Follows with [facility], Dr. Gardiner Rhyme, outpatient.  -Avoid QT prolonging medications  Rheumatoid arthritis Ulnar deviation of MCP joints bilaterally. Does not have  Rheumatologist and is not on DMARDs. -Monitor pain   -Ambulatory referral to Rheum on discharge   FEN/GI: Dysphagia 3, replete electrolytes as appropriate Prophylaxis: Encourage ambulation, SCDs   Disposition: SNF pending continued medical management per GI  Subjective:  Is tolerating soft diet with a few BM she thinks the food is the right consistency now.  Objective: Temp:  [98.2 F (36.8 C)-98.9 F (37.2 C)] 98.5 F (36.9 C) (04/09 0305) Pulse Rate:  [83-99] 83 (04/09 0305) Resp:  [14-16] 16 (04/09 0305) BP: (94-157)/(48-63) 113/57 (04/09 0305) SpO2:  [96 %-100 %] 96 % (04/09 0305) Weight:  [50.7 kg] 50.7 kg (04/09 0429)   Physical Exam:  General: Appears to be improving, no acute distress. Age appropriate. Cardiac: Irregular rhythm regular rate, normal heart sounds, no murmurs Respiratory: CTAB, normal effort Abdomen: soft, nontender, nondistended, +BS Extremities: LE edema Skin: Warm and dry  Laboratory: Recent Labs  Lab 12/22/19 0412 12/23/19 0349 12/25/19 0423  WBC 3.4* 4.1 3.8*  HGB 8.0* 8.1* 7.5*  HCT 24.8* 24.3* 22.8*  PLT 130* 146* 158   Recent Labs  Lab 12/24/19 0436 12/25/19 0423 12/26/19 0415  NA 132* 131* 137  K 3.2* 3.1* 3.6  CL 106 104 106  CO2 21* 21* 24  BUN 18 17 18   CREATININE 0.42* 0.44 0.45  CALCIUM 7.0* 7.0* 7.1*  PROT 4.6* 4.7* 4.4*  BILITOT 0.4 0.1* 0.2*  ALKPHOS 68 70 73  ALT 18 18 20   AST 18 18 22   GLUCOSE 101* 124* 106*    Imaging/Diagnostic Tests: No new imaging  Gerlene Fee, DO 12/26/2019, 7:44 AM PGY-1, [place] Intern pager: [phone], text pages welcome

## 2019-12-26 NOTE — Progress Notes (Signed)
Dr Erskine Speed was sent a secure epic message regarding Hgb of 6.7

## 2019-12-26 NOTE — Progress Notes (Signed)
PHARMACY - TOTAL PARENTERAL NUTRITION CONSULT NOTE   Indication: malnutrition  Patient Measurements: Height: 5\' 4"  (162.6 cm) Weight: 50.7 kg (111 lb 12.4 oz) IBW/kg (Calculated) : 54.7 TPN AdjBW (KG): 42.2 Body mass index is 19.19 kg/m.  Assessment: 74 yo female initially presented on 3/26 for weakness. Patient with PMH of RA, chronic anemia and GERD. She has had ongoing poor intake since Dec 2020 with LE swelling and unintentional weight loss. W/U does not seem to find anything significant thus far, but continues to have inadequate intake.  48 hour calorie count 4/3 - 4/5: inconsistent intake, not meeting goals  Glucose / Insulin: CBGs well controlled on Q12H SSI, has not required any insulin since yesterday morning Electrolytes:  Na 137, K 3.6, Phos 2.1, Mg 1.7 Renal: SCr 0.45, BUN 18 LFTs / TGs: Trigs 87, LFTs WNL Prealbumin / albumin: Prealbumin 5, Albumin 1.1 Intake / Output; MIVF: UOP 0.103ml/kg/hr, LBM 4/8 GI Imaging: 3/31 - barium swallow - unable to pass tablet - study shows esophageal dysmotility, dilation, and narrowing  4/1 CT abd pelvis - anasarca and ascites; likely underlying liver disease  Surgeries / Procedures: see above GI recommends EGD with dilation but pt needs improved nutritional status   Central access: PICC 4/1 TPN start date: 12/22/2019  Nutritional Goals (per RD recommendation on 4/8): kCal: 1750-1950 per day, Protein: 90-105 g/day, Fluid: > 1.7 L Goal concentrated TPN rate is 50 mL/hr (provides 55 g of protein and 1435kcals per day) with supplements pt is meeting >100% needs  Current Nutrition:  TPN Dysphagia 1 diet - variable, eating 0-100% of meals Prostat TID - 100 cal, 15 g protein, 10 g carbs (each) Boost BreezeTID - taking consistently , each supplement provides 250kcal and 9g protein  Plan:  Reduce TPN to 40 mL/hr at 1800 - provides 44g protein, 1149 Kcal; along with oral supplements pt is meeting >100% needs Electrolytes in TPN: increase  phos + mg, tried to maintain close to previous total amounts with rate reduction Cl:Ac ratio 1:2  Hold MVI and Trace in TPN as patient taking orally Continue Sensitive Q12H SSI and adjust as needed Monitor TPN labs on Mon/Thurs KPhos 65mmol IV x 1 Mg 1gm IV x 1 F/u AM BMET, Mg and Phos  Salome Arnt, PharmD, BCPS Clinical Pharmacist Please see AMION for all pharmacy numbers 12/26/2019 7:48 AM

## 2019-12-26 NOTE — Progress Notes (Signed)
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  Ongoing  GOAL:   Patient will meet greater than or equal to 90% of their needs  Met  MONITOR:   PO intake, Supplement acceptance, Diet advancement, Labs, Weight trends, Skin, I & O's  REASON FOR ASSESSMENT:   Consult Calorie Count  ASSESSMENT:   Deanna Cunningham is a 74 y.o. female presenting with weakness. PMH is significant for Rheumatoid Arthritis (no tx, no rheumatologist), chronic anemia and GERD.  4/2- PICC placed 4/5- TPN initiated  Reviewed I/O's: +1.1 L x 24 hours and +10.3 L since admission  UOP: 600 ml x 24 hours  Case discussed with pharmacist, who recommends decreasing TPN due to pt consistently meeting nutritional goals via meals, supplements, and TPN. Pt currently receiving TPN at 50 ml/hr, which provides 1444 kcals and 55 grams protein, meeting 83% of estimated kcal needs and 61% of estimated protein needs. Plan to decrease TPN today at 1800- TPN @  40 ml/hr, which provides 1149 kcals and 44 grams protein, meeting 66% of estimated kcals and 49% of estimated protein needs.   Over the past 24 hours, pt receiving approximately 3491 kcals and 159 grams protein (via supplements, PO's, and TPN), which meets >100% of estimated kcal and protein needs.   Labs reviewed: K, Mg, and Phos WD. CBGS: 98-118 (inpatient orders for glycemic control are 0-9 units insulin aspart every 12 hours),   Diet Order:    Diet Order            DIET - DYS 1 Room service appropriate? Yes with Assist; Fluid consistency: Thin  Diet effective now              EDUCATION NEEDS:   Education needs have been addressed  Skin:  Skin Assessment: Skin Integrity Issues: Skin Integrity Issues:: Stage II Stage II: coccyx Other: MASD to groin, labia, buttocks, and sacrum  Last BM:  12/25/19  Height:   Ht Readings from Last 1 Encounters:  12/13/19 5' 4"  (1.626 m)    Weight:   Wt Readings from Last 1 Encounters:  12/26/19 50.7 kg    Ideal Body Weight:  54.5 kg  BMI:  Body mass index is 19.19 kg/m.  Estimated Nutritional Needs:   Kcal:  7622-6333  Protein:  90-105 grams  Fluid:  > 1.7 L    [name], RD, LDN, [place] Registered Dietitian II Certified Diabetes Care and Education Specialist Please refer to [facility] for RD and/or RD on-call/weekend/after hours pager

## 2019-12-26 NOTE — Plan of Care (Signed)

## 2019-12-27 DIAGNOSIS — K224 Dyskinesia of esophagus: Secondary | ICD-10-CM | POA: Diagnosis not present

## 2019-12-27 DIAGNOSIS — R1310 Dysphagia, unspecified: Secondary | ICD-10-CM

## 2019-12-27 DIAGNOSIS — E8809 Other disorders of plasma-protein metabolism, not elsewhere classified: Secondary | ICD-10-CM | POA: Diagnosis not present

## 2019-12-27 LAB — COMPREHENSIVE METABOLIC PANEL
ALT: 30 U/L (ref 0–44)
AST: 36 U/L (ref 15–41)
Albumin: 1.2 g/dL — ABNORMAL LOW (ref 3.5–5.0)
Alkaline Phosphatase: 80 U/L (ref 38–126)
Anion gap: 7 (ref 5–15)
BUN: 16 mg/dL (ref 8–23)
CO2: 24 mmol/L (ref 22–32)
Calcium: 7.1 mg/dL — ABNORMAL LOW (ref 8.9–10.3)
Chloride: 106 mmol/L (ref 98–111)
Creatinine, Ser: 0.48 mg/dL (ref 0.44–1.00)
GFR calc Af Amer: >60 mL/min (ref >60)
GFR calc non Af Amer: >60 mL/min (ref >60)
Glucose, Bld: 125 mg/dL — ABNORMAL HIGH (ref 70–99)
Potassium: 3.6 mmol/L (ref 3.5–5.1)
Sodium: 137 mmol/L (ref 135–145)
Total Bilirubin: 0.4 mg/dL (ref 0.3–1.2)
Total Protein: 4.6 g/dL — ABNORMAL LOW (ref 6.5–8.1)

## 2019-12-27 LAB — CBC WITH DIFFERENTIAL/PLATELET
Abs Immature Granulocytes: 0.04 10*3/uL (ref 0.00–0.07)
Basophils Absolute: 0 10*3/uL (ref 0.0–0.1)
Basophils Relative: 1 %
Eosinophils Absolute: 0.1 10*3/uL (ref 0.0–0.5)
Eosinophils Relative: 1 %
HCT: 24.9 % — ABNORMAL LOW (ref 36.0–46.0)
Hemoglobin: 8 g/dL — ABNORMAL LOW (ref 12.0–15.0)
Immature Granulocytes: 1 %
Lymphocytes Relative: 9 %
Lymphs Abs: 0.5 10*3/uL — ABNORMAL LOW (ref 0.7–4.0)
MCH: 30 pg (ref 26.0–34.0)
MCHC: 32.1 g/dL (ref 30.0–36.0)
MCV: 93.3 fL (ref 80.0–100.0)
Monocytes Absolute: 0.5 10*3/uL (ref 0.1–1.0)
Monocytes Relative: 9 %
Neutro Abs: 4.4 10*3/uL (ref 1.7–7.7)
Neutrophils Relative %: 79 %
Platelets: 176 10*3/uL (ref 150–400)
RBC: 2.67 MIL/uL — ABNORMAL LOW (ref 3.87–5.11)
RDW: 16 % — ABNORMAL HIGH (ref 11.5–15.5)
WBC: 5.4 10*3/uL (ref 4.0–10.5)
nRBC: 0 % (ref 0.0–0.2)

## 2019-12-27 LAB — GLUCOSE, CAPILLARY
Glucose-Capillary: 120 mg/dL — ABNORMAL HIGH (ref 70–99)
Glucose-Capillary: 97 mg/dL (ref 70–99)
Glucose-Capillary: 106 mg/dL — ABNORMAL HIGH (ref 70–99)
Glucose-Capillary: 97 mg/dL (ref 70–99)

## 2019-12-27 LAB — MAGNESIUM: Magnesium: 1.6 mg/dL — ABNORMAL LOW (ref 1.7–2.4)

## 2019-12-27 LAB — OCCULT BLOOD X 1 CARD TO LAB, STOOL: Fecal Occult Bld: NEGATIVE

## 2019-12-27 LAB — PHOSPHORUS: Phosphorus: 3.1 mg/dL (ref 2.5–4.6)

## 2019-12-27 MED ORDER — ENOXAPARIN SODIUM 30 MG/0.3ML ~~LOC~~ SOLN
30.0000 mg | SUBCUTANEOUS | Status: DC
  Administered 2019-12-27 – 2020-02-03 (×39): 30 mg via SUBCUTANEOUS
  Filled 2019-12-27 (×40): qty 0.3

## 2019-12-27 MED ORDER — MAGNESIUM SULFATE 2 GM/50ML IV SOLN
2.0000 g | Freq: Once | INTRAVENOUS | Status: AC
  Administered 2019-12-27: 05:00:00 2 g via INTRAVENOUS
  Filled 2019-12-27: qty 50

## 2019-12-27 NOTE — Progress Notes (Addendum)
Family Medicine Teaching Service Daily Progress Note Intern Pager: [phone]  Patient name: Deanna Cunningham Medical record number: [id number] Date of birth: 1945-10-16 Age: 74 y.o. Gender: female  Primary Care Provider: Kristie Cowman, MD Consultants: Osker Mason, gastroenterology Code Status: Full Code   Pt Overview and Major Events to Date:  Hospital Day: 16 12/12/2019: admitted for Weakness and Abnormal Lab 12/18/2019 PICC placed 12/22/2019 TPN start date   Assessment and Plan: Deanna Cunningham is a 74 y.o. female who presented w/ global weakness, weight loss.  PMHx s/f malnutrition, .RA (naive to care), chronic anemia, GERD.  Underweight, severe protein caloriemalnutrition: stable Esophageal dysmotility: Stable P.o. intake of 451mL yesterday and 530 overnight.  Patient reports that she is taking p.o. well without any issue.  Tolerating TPN well. Gaining weight- On 4/9, patient 50.7 kg up from 42.2 kg on admission 12/12/2019.  Magnesium 1.6 this morning and repleted. -Dietitian on board, would appreciate recommendations -Continue MVI, 30 mL prostat TID, and TPN -GI on board, appreciate recommendations -Daily weights, strict I&O -Monitor mag, Phos due to refeeding syndrome and replete as appropriate per pharmacy -Work with social work/PT/OT for disposition   Paroxysmal SVT Overnight, patient noted to have tachycardia to the 140's. EKG is not changed from previous, which shows PVC's and right bundle branch block.  Patient reports that she felt okay overnight without any dizziness, chest pain when the nurses woke her up concerning her tachycardia.  Patient continues to have asymptomatic sinus tachycardia this morning with PVCs per telemetry.  Unidentified cause of tachycardia.  Vital signs otherwise stable, afebrile, electrolytes within normal limits, hemoglobin 8, stable. Normal echocardiogram in March with LVEF 65%.  -continue telemetry -Call cardiology if symptomatic.    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 Follows with [facility], Dr. Gardiner Rhyme, outpatient.  -Avoid QT prolonging medications  Normocytic, hypochromic anemia: Chronic, currently stable. Status post 1 unit overnight.  Hemoglobin this morning is 8 -Heme/onc consulted and appreciate recommendations, will follow outpatient -f/u AM CBC q48h  GERD: Chronic, improved. Minimal symptoms on Pepcid and Protonix. -Famotidine 20mg  BID -Protonix 40 mg -Sucralfate with meals/bedtime   Sacral wound: stable.  -Keep area clean and covered, add padding to protect her skin -Recommend frequent position changes while lying in bed -Encourage out of bed  Rheumatoid arthritis Ulnar deviationof MCP joints bilaterally. Does not have Rheumatologist and is not on DMARDs. -Monitor pain  -Ambulatory referral to Rheum on discharge   FEN/GI: Dysphagia 1, replete electrolytes as appropriate  Prophylaxis:- Encourage ambulation ncourage ambulation, SCDs   Disposition: SW consulted for SNF placement    Subjective:  Episodes of sinus tachycardia overnight.  Patient reports that she was asymptomatic and she was sleeping comfortably.  She does not have any complaints this morning.  Objective: Temp:  [97.9 F (36.6 C)-99.1 F (37.3 C)] 98.5 F (36.9 C) (04/10 0654) Pulse Rate:  [60-140] 71 (04/10 0654) Cardiac Rhythm: Sinus tachycardia (04/10 0250) Resp:  [17-18] 18 (04/10 0023) BP: (98-134)/(50-84) 99/57 (04/10 0654) SpO2:  [97 %-100 %] 100 % (04/10 0654) Intake/Output      04/09 0701 - 04/10 0700   P.O. 1010   I.V. (mL/kg) 530.7 (10.5)   Blood 371.7   IV Piggyback 46.3   Total Intake(mL/kg) 1958.6 (38.6)   Net +1958.6       Urine Occurrence 5 x   Stool Occurrence 4 x       Physical Exam: General: NAD, non-toxic, cachectic, lying comfortably in bed Cardiovascular: Tachycardic, 1+ BLEE above  knees, no SCDs placed  Respiratory: CTAB. No IWOB.  Abdomen: + BS. NT, ND, soft to palpation.   Extremities: Warm and well perfused. Moving spontaneously.  Integumentary: No obvious rashes, lesions, trauma on general exam.  Sacral wound not observed on physical exam today Neuro: CN grossly intact. No FND  Laboratory: I have personally read and reviewed all labs and imaging studies.  CBC: Recent Labs  Lab 12/22/19 0412 12/23/19 0349 12/25/19 0423 12/26/19 1420 12/27/19 0300  WBC 3.4*   < > 3.8* 4.4 5.4  NEUTROABS 2.5  --  2.8  --  4.4  HGB 8.0*   < > 7.5* 6.7* 8.0*  HCT 24.8*   < > 22.8* 20.3* 24.9*  MCV 96.1   < > 94.6 96.2 93.3  PLT 130*   < > 158 187 176   < > = values in this interval not displayed.   CMP: Recent Labs  Lab 12/25/19 0423 12/26/19 0415 12/27/19 0300  NA 131* 137 137  K 3.1* 3.6 3.6  CL 104 106 106  CO2 21* 24 24  GLUCOSE 124* 106* 125*  BUN 17 18 16   CREATININE 0.44 0.45 0.48  CALCIUM 7.0* 7.1* 7.1*  MG 1.9 1.7 1.6*  PHOS 2.5 2.1* 3.1  ALBUMIN 1.1* 1.1* 1.2*   CBG: Recent Labs  Lab 12/26/19 0557 12/26/19 1140 12/26/19 1650 12/26/19 2122 12/27/19 0059  GLUCAP 98 102* 103* 105* 120*   Micro: Covid Negative   Wilber Oliphant, MD 12/27/2019, 6:58 AM PGY-2, [place] Intern pager: [phone], text pages welcome

## 2019-12-27 NOTE — Progress Notes (Signed)
TPN rate change from 22ml/hr to 75ml/hr.

## 2019-12-27 NOTE — Plan of Care (Signed)
  Problem: Safety: Goal: Ability to remain free from injury will improve Outcome: Progressing   

## 2019-12-27 NOTE — Progress Notes (Signed)
Md paged regarding continued tachycardia from the 1teens's -130's  at rest  and into the 140's 150's with minimal activity  causing the  Pt's  MEWS score to continue to fire in the yellow  Md came to bedside to assess pt  EKG completed  Labs completed  Md ordered replacement mag d/t low mag on lab results  IV mag infusing  Pt resting comfortably  Md stated to contact if pt hr sustains above the 150's or if pt symptomatic

## 2019-12-27 NOTE — Progress Notes (Signed)
Spoke to [place] at pharmacy who reported that patient is currently taking 80% of her protein goals with Ensure and prostat alone.  Additionally, with other p.o. intake she is likely near or at goal.  Currently overfeeding with TPN and is not indicated for nutritional supplementation.  Will discontinue TPN at this time and monitor patient's p.o. intake over the weekend.  If patient has any decrease in p.o. intake, we will reconsult pharmacy to restart TPN.  Continue daily weights and plans per daily progress note.    Wilber Oliphant, M.D.  9:14 AM 12/27/2019

## 2019-12-27 NOTE — Progress Notes (Addendum)
Lovenox discontinued on March 31 at 0837 by [place] per order review.  There is no documentation for this clinical decision.  Given no sign of active bleeding, will plan to restart Lovenox if FOBT today is negative. Patient's hgb is 8.0 and plts stable at 176. Patient has not been ambulating.   Physical therapy consulted (last session 12/24/19) and continues to follow. Previously noted to have limitations of functional strength, power and endurance. No SCDs on during exam today; legs warm, but nonerythematous, non painful, with 1+ pitting edema to knees. No concern for DVT at this time. Spoke to RN who will collect FOBT today. Day team aware to restart Lovenox upon result return.  Wilber Oliphant, M.D.  9:22 AM 12/27/2019   ADDENDUM  Spoke to [place] who notes that hgb was cause of discontinuation of lovenox. Appreciate communication. Continue plans as above.   Wilber Oliphant, M.D.  9:32 AM 12/27/2019

## 2019-12-27 NOTE — Progress Notes (Signed)
FPTS Interim Progress Note  S: Paged by RN with concern that patients heart irregular bouncing between 70's to 140's.  Patient sleeping at the time she got the call from tech monitors.  I went to assess patient.  She looks comfortable and denies any dizziness, increase weakness, chest pain, palpations or shortness of breath.  She states she is feels fine and reports she was sleeping fine.  O: BP 118/84   Pulse 60   Temp 98 F (36.7 C) (Oral)   Resp 18   Ht 5\' 4"  (1.626 m)   Wt 50.7 kg   SpO2 100%   BMI 19.19 kg/m    General: Alert and oriented, no apparent distress  Neck: elevated JVD Cardiovascular: Regular rhythm HR 80-120's, no murmurs appreciated Respiratory: CTA bilaterally, no wheezing or crackles appreciated.   A/P:  Arrhythmia Considered in differential Afib, SVT and Sinus tachycardia.  Patient complains of no chest pain or shortness of breath and is hemodynamically stable. Transfusion completed at [place] 1u PRBC's for Hbg 6.5. Patient also receiving TPN.  She has has electrolyte imbalance in the past. Arrhythmia could be secondary to hyperkalemia from blood transfusion or low electrolytes given decrease in po intake.ECG show sinus rhythm with PVC's. On exam patient alert and in no acute distress.  Regular rhythm with HR 80-120's.   -ECG now -CBC, BMP, Mag, Ca and Phos now  -Continue cardiac monitoring -Replete electrolytes as appropriate.  Carollee Leitz, MD 12/27/2019, 3:25 AM PGY-1, [place] Medicine Service pager [phone]

## 2019-12-27 NOTE — Progress Notes (Signed)
PHARMACY - TOTAL PARENTERAL NUTRITION CONSULT NOTE   Indication: malnutrition  Patient Measurements: Height: 5\' 4"  (162.6 cm) Weight: 50.7 kg (111 lb 12.4 oz) IBW/kg (Calculated) : 54.7 TPN AdjBW (KG): 42.2 Body mass index is 19.19 kg/m.  Assessment: 74 yo female initially presented on 3/26 for weakness. Patient with PMH of RA, chronic anemia and GERD. She has had ongoing poor intake since Dec 2020 with LE swelling and unintentional weight loss. W/U does not seem to find anything significant thus far, but continues to have inadequate intake.  48 hour calorie count 4/3 - 4/5: inconsistent intake, not meeting goals  4/10 - Patient consistently consuming > 80% of protein goal with shakes/supplements, alone in addition to oral diet  Central access: PICC 4/1 TPN start date: 12/22/2019  Nutritional Goals (per RD recommendation on 4/8): kCal: 1750-1950 per day, Protein: 90-105 g/day, Fluid: > 1.7 L Current Nutrition:  TPN Dysphagia 1 diet - variable, eating 0-100% of meals Prostat TID - 100 cal, 15 g protein, 10 g carbs (each) Boost BreezeTID - taking consistently , each supplement provides 250kcal and 9g protein  Plan:  Will wean current TPN bag to 25 mL/hr and stop TPN tonight FMTS will re-consult pharmacy to resume TPN if patient's diet reverts and does not consistently take in adequate protein and calories.  Barth Kirks, PharmD, BCPS, BCCCP Clinical Pharmacist [phone]  Please check AMION for all [place] numbers  12/27/2019 9:12 AM

## 2019-12-27 NOTE — Progress Notes (Signed)
FOBT collected - sent to lab.

## 2019-12-28 DIAGNOSIS — E876 Hypokalemia: Secondary | ICD-10-CM | POA: Diagnosis not present

## 2019-12-28 DIAGNOSIS — K224 Dyskinesia of esophagus: Secondary | ICD-10-CM | POA: Diagnosis not present

## 2019-12-28 DIAGNOSIS — E8809 Other disorders of plasma-protein metabolism, not elsewhere classified: Secondary | ICD-10-CM | POA: Diagnosis not present

## 2019-12-28 LAB — COMPREHENSIVE METABOLIC PANEL
ALT: 28 U/L (ref 0–44)
AST: 29 U/L (ref 15–41)
Albumin: 1.2 g/dL — ABNORMAL LOW (ref 3.5–5.0)
Alkaline Phosphatase: 73 U/L (ref 38–126)
Anion gap: 4 — ABNORMAL LOW (ref 5–15)
BUN: 17 mg/dL (ref 8–23)
CO2: 28 mmol/L (ref 22–32)
Calcium: 7.1 mg/dL — ABNORMAL LOW (ref 8.9–10.3)
Chloride: 107 mmol/L (ref 98–111)
Creatinine, Ser: 0.46 mg/dL (ref 0.44–1.00)
GFR calc Af Amer: >60 mL/min (ref >60)
GFR calc non Af Amer: >60 mL/min (ref >60)
Glucose, Bld: 93 mg/dL (ref 70–99)
Potassium: 4 mmol/L (ref 3.5–5.1)
Sodium: 139 mmol/L (ref 135–145)
Total Bilirubin: 0.4 mg/dL (ref 0.3–1.2)
Total Protein: 4.6 g/dL — ABNORMAL LOW (ref 6.5–8.1)

## 2019-12-28 LAB — CBC WITH DIFFERENTIAL/PLATELET
Abs Immature Granulocytes: 0.04 10*3/uL (ref 0.00–0.07)
Basophils Absolute: 0 10*3/uL (ref 0.0–0.1)
Basophils Relative: 0 %
Eosinophils Absolute: 0 10*3/uL (ref 0.0–0.5)
Eosinophils Relative: 1 %
HCT: 24.8 % — ABNORMAL LOW (ref 36.0–46.0)
Hemoglobin: 8 g/dL — ABNORMAL LOW (ref 12.0–15.0)
Immature Granulocytes: 1 %
Lymphocytes Relative: 8 %
Lymphs Abs: 0.4 10*3/uL — ABNORMAL LOW (ref 0.7–4.0)
MCH: 30.9 pg (ref 26.0–34.0)
MCHC: 32.3 g/dL (ref 30.0–36.0)
MCV: 95.8 fL (ref 80.0–100.0)
Monocytes Absolute: 0.4 10*3/uL (ref 0.1–1.0)
Monocytes Relative: 8 %
Neutro Abs: 4.6 10*3/uL (ref 1.7–7.7)
Neutrophils Relative %: 82 %
Platelets: 213 10*3/uL (ref 150–400)
RBC: 2.59 MIL/uL — ABNORMAL LOW (ref 3.87–5.11)
RDW: 16.5 % — ABNORMAL HIGH (ref 11.5–15.5)
WBC: 5.6 10*3/uL (ref 4.0–10.5)
nRBC: 0 % (ref 0.0–0.2)

## 2019-12-28 LAB — GLUCOSE, CAPILLARY
Glucose-Capillary: 104 mg/dL — ABNORMAL HIGH (ref 70–99)
Glucose-Capillary: 105 mg/dL — ABNORMAL HIGH (ref 70–99)
Glucose-Capillary: 106 mg/dL — ABNORMAL HIGH (ref 70–99)
Glucose-Capillary: 76 mg/dL (ref 70–99)
Glucose-Capillary: 99 mg/dL (ref 70–99)

## 2019-12-28 LAB — MAGNESIUM: Magnesium: 1.8 mg/dL (ref 1.7–2.4)

## 2019-12-28 LAB — PHOSPHORUS: Phosphorus: 2.6 mg/dL (ref 2.5–4.6)

## 2019-12-28 NOTE — Progress Notes (Signed)
Family Medicine Teaching Service Daily Progress Note Intern Pager: [phone]  Patient name: Deanna Cunningham Medical record number: [id number] Date of birth: 03-05-1946 Age: 74 y.o. Gender: female  Primary Care Provider: Kristie Cowman, MD Consultants: Osker Mason, gastroenterology Code Status: Full Code   Pt Overview and Major Events to Date:  Hospital Day: 17 12/12/2019: admitted for Weakness and Abnormal Lab 12/18/2019 PICC placed 12/22/2019 TPN start date   Assessment and Plan: Deanna Cunningham is a 74 y.o. female who presented w/ global weakness, weight loss.  PMHx s/f malnutrition, RA (naive to care), chronic anemia, GERD.  Underweight, severe protein caloriemalnutrition: stable Esophageal dysmotility: Stable P.o. intake of 123mL yesterday, 380cc intake ON.  Patient reports that she is taking p.o. well without any issue. TPN on hold. Gaining weight well. On 4/10, patient 50.7 kg, up from 42.2 kg on admission 12/12/2019.  Magnesium 1.8 this morning. -Dietitian on board, would appreciate recommendations -Continue MVI, 30 mL prostat TID, hold TPN -GI on board, appreciate recommendations -Daily weights, strict I&O -Monitor mag, Phos due to refeeding syndrome and replete as appropriate per pharmacy -Work with social work/PT/OT for disposition   Paroxysmal SVT Overnight, patient noted to have tachycardia to the 140's. EKG is not changed from previous, which shows PVC's and right bundle branch block.  Patient reports that she felt okay overnight without any dizziness, chest pain when the nurses woke her up concerning her tachycardia.  Patient continues to have asymptomatic sinus tachycardia this morning with PVCs per telemetry.  Unidentified cause of tachycardia.  Vital signs otherwise stable, afebrile, electrolytes within normal limits, hemoglobin 8, stable. Normal echocardiogram in March with LVEF 65%.  -continue telemetry -Call cardiology if symptomatic.   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 Follows with [facility], Dr. Gardiner Rhyme, outpatient.  -Avoid QT prolonging medications  Normocytic, hypochromic anemia: Chronic, currently stable. FOBT 4/10 negative. Hemoglobin yesterday 8, checking every 28 hours. -Heme/onc consulted and appreciate recommendations, will follow outpatient -f/u AM CBC q48h  GERD: Chronic, improved. Minimal symptoms on Pepcid and Protonix. -Famotidine 20mg  BID -Protonix 40 mg -Sucralfate with meals/bedtime   Sacral wound: stable, improved.  -Keep area clean and covered, add padding to protect her skin -Recommend frequent position changes while lying in bed -Encourage out of bed  Rheumatoid arthritis Ulnar deviationof MCP joints bilaterally. Does not have Rheumatologist and is not on DMARDs. -Monitor pain  -Ambulatory referral to Rheum on discharge   FEN/GI: Dysphagia 1, replete electrolytes as appropriate  Prophylaxis:Lovenox, encourage ambulation  Disposition: SW consulted for SNF placement    Subjective:  No complaints or concerns this morning, very pleasant patient  Objective: Temp:  [98.2 F (36.8 C)-98.9 F (37.2 C)] 98.8 F (37.1 C) (04/11 0246) Pulse Rate:  [98-108] 106 (04/11 0246) Cardiac Rhythm: Sinus tachycardia (04/10 1909) Resp:  [18-19] 19 (04/10 1555) BP: (102-127)/(50-70) 127/70 (04/11 0246) SpO2:  [100 %] 100 % (04/11 0246) Weight:  [50.7 kg] 50.7 kg (04/10 1049) Intake/Output      04/10 0701 - 04/11 0700 04/11 0701 - 04/12 0700   P.O. 500    I.V. (mL/kg)     Blood     IV Piggyback     Total Intake(mL/kg) 500 (9.9)    Net +500         Urine Occurrence 16 x    Stool Occurrence 3 x        Physical Exam: General: NAD, non-toxic, cachectic, lying comfortably in bed Cardiovascular: Tachycardic, 2+ BL extremity edema at thighs  Respiratory: CTAB. Normal WOB.  Abdomen: + BS. NT, ND, soft to palpation.  Extremities: Warm and well perfused. Moving spontaneously.  Integumentary: No obvious  rashes, lesions, trauma on general exam.  Neuro: CN grossly intact. No FND  Laboratory: I have personally read and reviewed all labs and imaging studies.  CBC: Recent Labs  Lab 12/22/19 0412 12/23/19 0349 12/25/19 0423 12/26/19 1420 12/27/19 0300  WBC 3.4*   < > 3.8* 4.4 5.4  NEUTROABS 2.5  --  2.8  --  4.4  HGB 8.0*   < > 7.5* 6.7* 8.0*  HCT 24.8*   < > 22.8* 20.3* 24.9*  MCV 96.1   < > 94.6 96.2 93.3  PLT 130*   < > 158 187 176   < > = values in this interval not displayed.   CMP: Recent Labs  Lab 12/26/19 0415 12/27/19 0300 12/28/19 0406  NA 137 137 139  K 3.6 3.6 4.0  CL 106 106 107  CO2 24 24 28   GLUCOSE 106* 125* 93  BUN 18 16 17   CREATININE 0.45 0.48 0.46  CALCIUM 7.1* 7.1* 7.1*  MG 1.7 1.6* 1.8  PHOS 2.1* 3.1 2.6  ALBUMIN 1.1* 1.2* 1.2*   CBG: Recent Labs  Lab 12/27/19 0653 12/27/19 1150 12/27/19 1713 12/27/19 2339 12/28/19 0651  GLUCAP 97 106* 97 106* 76     Daisy Floro, DO 12/28/2019, 7:41 AM PGY-2, [place] Intern pager: [phone], text pages welcome

## 2019-12-29 DIAGNOSIS — E43 Unspecified severe protein-calorie malnutrition: Secondary | ICD-10-CM | POA: Diagnosis not present

## 2019-12-29 DIAGNOSIS — K224 Dyskinesia of esophagus: Secondary | ICD-10-CM | POA: Diagnosis not present

## 2019-12-29 DIAGNOSIS — E8809 Other disorders of plasma-protein metabolism, not elsewhere classified: Secondary | ICD-10-CM | POA: Diagnosis not present

## 2019-12-29 LAB — COMPREHENSIVE METABOLIC PANEL
ALT: 23 U/L (ref 0–44)
AST: 21 U/L (ref 15–41)
Albumin: 1.2 g/dL — ABNORMAL LOW (ref 3.5–5.0)
Alkaline Phosphatase: 71 U/L (ref 38–126)
Anion gap: 7 (ref 5–15)
BUN: 19 mg/dL (ref 8–23)
CO2: 25 mmol/L (ref 22–32)
Calcium: 7.3 mg/dL — ABNORMAL LOW (ref 8.9–10.3)
Chloride: 105 mmol/L (ref 98–111)
Creatinine, Ser: 0.51 mg/dL (ref 0.44–1.00)
GFR calc Af Amer: >60 mL/min (ref >60)
GFR calc non Af Amer: >60 mL/min (ref >60)
Glucose, Bld: 97 mg/dL (ref 70–99)
Potassium: 3.9 mmol/L (ref 3.5–5.1)
Sodium: 137 mmol/L (ref 135–145)
Total Bilirubin: 0.5 mg/dL (ref 0.3–1.2)
Total Protein: 4.7 g/dL — ABNORMAL LOW (ref 6.5–8.1)

## 2019-12-29 LAB — CBC WITH DIFFERENTIAL/PLATELET
Abs Immature Granulocytes: 0.02 10*3/uL (ref 0.00–0.07)
Basophils Absolute: 0 10*3/uL (ref 0.0–0.1)
Basophils Relative: 0 %
Eosinophils Absolute: 0.1 10*3/uL (ref 0.0–0.5)
Eosinophils Relative: 1 %
HCT: 23.4 % — ABNORMAL LOW (ref 36.0–46.0)
Hemoglobin: 7.3 g/dL — ABNORMAL LOW (ref 12.0–15.0)
Immature Granulocytes: 0 %
Lymphocytes Relative: 9 %
Lymphs Abs: 0.5 10*3/uL — ABNORMAL LOW (ref 0.7–4.0)
MCH: 30.2 pg (ref 26.0–34.0)
MCHC: 31.2 g/dL (ref 30.0–36.0)
MCV: 96.7 fL (ref 80.0–100.0)
Monocytes Absolute: 0.5 10*3/uL (ref 0.1–1.0)
Monocytes Relative: 10 %
Neutro Abs: 4.3 10*3/uL (ref 1.7–7.7)
Neutrophils Relative %: 80 %
Platelets: 208 10*3/uL (ref 150–400)
RBC: 2.42 MIL/uL — ABNORMAL LOW (ref 3.87–5.11)
RDW: 16.4 % — ABNORMAL HIGH (ref 11.5–15.5)
WBC: 5.3 10*3/uL (ref 4.0–10.5)
nRBC: 0 % (ref 0.0–0.2)

## 2019-12-29 LAB — TRIGLYCERIDES: Triglycerides: 33 mg/dL (ref <150)

## 2019-12-29 LAB — MAGNESIUM: Magnesium: 1.6 mg/dL — ABNORMAL LOW (ref 1.7–2.4)

## 2019-12-29 LAB — PREALBUMIN: Prealbumin: 11.3 mg/dL — ABNORMAL LOW (ref 18–38)

## 2019-12-29 LAB — GLUCOSE, CAPILLARY
Glucose-Capillary: 85 mg/dL (ref 70–99)
Glucose-Capillary: 108 mg/dL — ABNORMAL HIGH (ref 70–99)
Glucose-Capillary: 112 mg/dL — ABNORMAL HIGH (ref 70–99)

## 2019-12-29 LAB — PHOSPHORUS: Phosphorus: 2.6 mg/dL (ref 2.5–4.6)

## 2019-12-29 MED ORDER — MAGNESIUM SULFATE 2 GM/50ML IV SOLN
2.0000 g | Freq: Once | INTRAVENOUS | Status: AC
  Administered 2019-12-29: 2 g via INTRAVENOUS
  Filled 2019-12-29: qty 50

## 2019-12-29 MED ORDER — THIAMINE HCL 100 MG PO TABS
200.0000 mg | ORAL_TABLET | Freq: Every day | ORAL | Status: DC
  Administered 2019-12-29 – 2020-01-15 (×17): 200 mg via ORAL
  Filled 2019-12-29 (×19): qty 2

## 2019-12-29 NOTE — Progress Notes (Signed)
Family Medicine Teaching Service Daily Progress Note Intern Pager: [phone]  Patient name: Deanna Cunningham Medical record number: [id number] Date of birth: [birth year] Age: 74 y.o. Gender: female  Primary Care Provider: Kristie Cowman, MD Consultants: GI Code Status: Full  Pt Overview and Major Events to Date:  03/26-Admitted 04/01-PICC inserted   Assessment and Plan: Deanna Cunningham is a 74 y.o. female who presented w/ global weakness, weight loss.  PMHx s/f malnutrition, RA (naive to care), chronic anemia, GERD.  Underweight, severe protein caloriemalnutrition: stable Esophageal dysmotility: Stable P.o. intake yesterday 526mL.  This morning she feels better and is currently having breakfast.  TPN is on hold.  Magnesium 1.6 this morning.  Phos 2.6. -Dietitian following appreciate recommendation -GI following, appreciate recommendations -Continue multivitamins -TPN on hold -Daily weights -Strict I's and O's -Replete electrolytes as needed -Mag 2 g IV x1 -BMP, mag and Phos in a.m -Reticulocytes, Haptoglobin, LDH, Prealbumin, Vit B1 -Thiamine 200 mg daily -Social work/PT/OT following  Sinus tachycardia Heart rate overnight 96-123.  Patient reports no chest pain, shortness of breath or feeling heart palpitations.  Reports she feels good. 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 Follows with heart care, Dr. Nechama Guard outpatient -Avoid QT prolongation medications  Normocytic, hypochromic anemia: Chronic, currently stable. Hemoglobin 7.3 today.  She denies any dizziness, chest pain or shortness of breath.  No obvious signs of bleeding. -Heme-onc consulted, appreciate recommendations, will follow patient -CBC daily  GERD: Chronic -Continue famotidine 20 mg twice daily -Continue Protonix 40 mg daily -Sulcrafate with meals and bedtime  Sacral wound: stable, improved. -Routine care -Turn every 2h -Encourage  out of bed  Rheumatoid arthritis Chronic.  Stable. -Follow-up rheumatology on discharge  FEN/GI: -Dysphagia 1 Prophylaxis: -Lovenox  Disposition: SNF medically stable  Subjective:  No acute events overnight.  Patient slept well.  Denies any chest pain, dizziness, shortness of breath or abdominal pain.  Feeling much better today.  Sitting in bed eating breakfast.  Objective: Temp:  [98.6 F (37 C)-99.2 F (37.3 C)] 98.9 F (37.2 C) (04/12 0300) Pulse Rate:  [68-123] 68 (04/12 0350) Resp:  [18-22] 20 (04/12 0300) BP: (99-110)/(44-64) 99/44 (04/12 0300) SpO2:  [98 %-100 %] 100 % (04/12 0300)   Physical Exam: General: Alert and oriented, appears cachectic but in no apparent distress  Cardiovascular: RRR with no murmurs noted Respiratory: CTA bilaterally  Gastrointestinal: Bowel sounds present. No abdominal pain Extremities: Mild trace edema in lower extremities  Laboratory: Recent Labs  Lab 12/27/19 0300 12/28/19 1116 12/29/19 0322  WBC 5.4 5.6 5.3  HGB 8.0* 8.0* 7.3*  HCT 24.9* 24.8* 23.4*  PLT 176 213 208   Recent Labs  Lab 12/27/19 0300 12/28/19 0406 12/29/19 0322  NA 137 139 137  K 3.6 4.0 3.9  CL 106 107 105  CO2 24 28 25   BUN 16 17 19   CREATININE 0.48 0.46 0.51  CALCIUM 7.1* 7.1* 7.3*  PROT 4.6* 4.6* 4.7*  BILITOT 0.4 0.4 0.5  ALKPHOS 80 73 71  ALT 30 28 23   AST 36 29 21  GLUCOSE 125* 93 97      Imaging/Diagnostic Tests: No results found.  Carollee Leitz, MD 12/29/2019, 6:42 AM PGY-1, [place] Intern pager: [phone], text pages welcome

## 2019-12-29 NOTE — Progress Notes (Signed)
Physical Therapy Treatment Patient Details Name: Deanna Cunningham MRN: [id number] DOB: 06-27-1946 Today's Date: 12/29/2019    History of Present Illness Pt is a 74 y.o. female admitted 12/12/19 with progressive weakness, poor PO intake and BLE swelling; worked up for malnutrition (concern for malignancy). Pt also with anemia, electrolyte imbalance, stage 1 sacral wound. Pt with recurrent asymptomatic SVT; may occur in the settings of severe malnutrition and electrolyte imbalance. PMH includes dysphagia, CKD III, anemia, RA.   PT Comments    Pt progressing with mobility. Demonstrates improved activity tolerance this session, motivated to participate. Remains limited by generalized weakness, fatigue and poor balance strategies/postural reactions; at high risk for falls. Based on pt's current functional status with prolonged hospital admission, recommend intensive CIR-level therapies to maximize functional mobility and independence prior to return home. Spoke with pt's daughter on the phone who is in agreement.    Follow Up Recommendations  CIR;Supervision/Assistance - 24 hour     Equipment Recommendations  (TBD)    Recommendations for Other Services       Precautions / Restrictions Precautions Precautions: Fall;Other (comment) Precaution Comments: Bowel incontinence, bilateral hand/finger RA, stage I sacral wound Restrictions Weight Bearing Restrictions: No    Mobility  Bed Mobility Overal bed mobility: Needs Assistance Bed Mobility: Supine to Sit     Supine to sit: Min assist     General bed mobility comments: MinA to assist scooting to EOB and again to edge of chair; pt attempting scooting but not able to translate hips forward without assist  Transfers Overall transfer level: Needs assistance Equipment used: Rolling walker (2 wheeled) Transfers: Sit to/from Stand Sit to Stand: Mod assist;Max assist         General transfer comment: Initial modA to stand from elevated  bed height; able to stand from recliner with modA, maxA on second trial with increased fatigue; significant increased time to achieve full trunk extension  Ambulation/Gait Ambulation/Gait assistance: Min guard;Min assist;Mod assist Gait Distance (Feet): 80 Feet(+60) Assistive device: Rolling walker (2 wheeled) Gait Pattern/deviations: Step-through pattern;Decreased stride length;Decreased dorsiflexion - left;Trunk flexed;Staggering right;Staggering left Gait velocity: Decreased Gait velocity interpretation: <1.31 ft/sec, indicative of household ambulator General Gait Details: Slow, unsteady gait with RW at times min guard, but requiring frequent min-modA to prevent LOB especially with turns, seems to have poor awareness of this happening; pt at times running into objects with RW wheels but able to correct with intermittent minA. 2x seated rest breaks secondary to fatigue   Stairs             Wheelchair Mobility    Modified Rankin (Stroke Patients Only)       Balance Overall balance assessment: Needs assistance Sitting-balance support: Feet supported Sitting balance-Leahy Scale: Fair Sitting balance - Comments: Unable to reach bilateral feet sitting in recliner   Standing balance support: Bilateral upper extremity supported;During functional activity Standing balance-Leahy Scale: Poor Standing balance comment: requires UE support                             Cognition Arousal/Alertness: Awake/alert Behavior During Therapy: WFL for tasks assessed/performed Overall Cognitive Status: No family/caregiver present to determine baseline cognitive functioning Area of Impairment: Attention;Following commands;Awareness;Problem solving;Safety/judgement                   Current Attention Level: Sustained;Selective   Following Commands: Follows multi-step commands with increased time Safety/Judgement: Decreased awareness of deficits Awareness: Emergent Problem  Solving:  Slow processing;Requires verbal cues        Exercises      General Comments General comments (skin integrity, edema, etc.): Spoke with daughter [name]) on phone regarding d/c recs for CIR (pending insurance approval); if not, will recommend SNF; daughter in agreement      Pertinent Vitals/Pain Pain Assessment: Faces Faces Pain Scale: Hurts a little bit Pain Location: BLE Pain Descriptors / Indicators: Discomfort;Sore;Heaviness Pain Intervention(s): Monitored during session    Home Living                      Prior Function            PT Goals (current goals can now be found in the care plan section) Progress towards PT goals: Progressing toward goals    Frequency    Min 3X/week      PT Plan Discharge plan needs to be updated    Co-evaluation              AM-PAC PT "6 Clicks" Mobility   Outcome Measure  Help needed turning from your back to your side while in a flat bed without using bedrails?: A Little Help needed moving from lying on your back to sitting on the side of a flat bed without using bedrails?: A Little Help needed moving to and from a bed to a chair (including a wheelchair)?: A Lot Help needed standing up from a chair using your arms (e.g., wheelchair or bedside chair)?: A Lot Help needed to walk in hospital room?: A Little Help needed climbing 3-5 steps with a railing? : A Lot 6 Click Score: 15    End of Session Equipment Utilized During Treatment: Gait belt Activity Tolerance: Patient tolerated treatment well Patient left: in chair;with call bell/phone within reach;with chair alarm set Nurse Communication: Mobility status PT Visit Diagnosis: Other abnormalities of gait and mobility (R26.89);Muscle weakness (generalized) (M62.81);Difficulty in walking, not elsewhere classified (R26.2)     Time: 1132-1204 PT Time Calculation (min) (ACUTE ONLY): 32 min  Charges:  $Gait Training: 8-22 mins $Therapeutic Activity: 8-22  mins                    Mabeline Caras, PT, DPT Acute Rehabilitation Services  Pager [phone] Office [place] 12/29/2019, 3:11 PM

## 2019-12-29 NOTE — Progress Notes (Signed)
Inpatient Rehab Admissions Coordinator:   At this time we are recommending a CIR consult.  I will request an order.   Shann Medal, PT, DPT Admissions Coordinator [phone] 12/29/19  5:04 PM

## 2019-12-29 NOTE — Progress Notes (Signed)
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  Ongoing  GOAL:   Patient will meet greater than or equal to 90% of their needs  Met  MONITOR:   PO intake, Supplement acceptance, Diet advancement, Labs, Weight trends, Skin, I & O's  REASON FOR ASSESSMENT:   Consult Calorie Count  ASSESSMENT:   Deanna Cunningham is a 73 y.o. female presenting with weakness. PMH is significant for Rheumatoid Arthritis (no tx, no rheumatologist), chronic anemia and GERD.  4/2- PICC placed 4/5- TPN initiated 4/10- TPN d/c  Reviewed I/O's: -1.4 L x 24 hours and +8.2 L since 12/15/19  UOP: 2 L x 24 hours  Attempted to speak with pt via phone, however, no answer.   Pt's oral intake has improved significantly over the past week. TPN was started last Monday (12/22/19) due to concern of inadequate oral intake, as pt was only able to consume liquids. Over the past week, average nutritional intake (meals, oral nutritional supplements, and TPN) was 2378 kcals and 125 grams protein, which exceeds pt estimated nutritional needs. TPN was d/c on 12/27/19 due to increased oral intake.   Pt remains on a dysphagia 1 diet and tolerating well. Over the past 24 hours, pt has consumed approximately 2452 kcals and 125 grams protein (meals and supplements), which exceeds estimated nutritional needs.   Labs reviewed: K and Phos WDL. Mg: 1.6. CBGS: 85-108.   Diet Order:   Diet Order           DIET - DYS 1 Room service appropriate? Yes with Assist; Fluid consistency: Thin  Diet effective now              EDUCATION NEEDS:   Education needs have been addressed  Skin:  Skin Assessment: Skin Integrity Issues: Skin Integrity Issues:: Stage II Stage II: coccyx Other: MASD to groin, labia, buttocks, and sacrum  Last BM:  12/29/19  Height:   Ht Readings from Last 1 Encounters:  12/27/19 5' 4" (1.626 m)    Weight:   Wt Readings from Last 1 Encounters:  12/27/19 50.7 kg    Ideal Body Weight:  54.5 kg  BMI:  Body mass index is 19.19 kg/m.  Estimated Nutritional Needs:   Kcal:  1750-1950  Protein:  90-105 grams  Fluid:  > 1.7 L    Deanna Cunningham, RD, LDN, CDCES Registered Dietitian II Certified Diabetes Care and Education Specialist Please refer to AMION for RD and/or RD on-call/weekend/after hours pager 

## 2019-12-30 DIAGNOSIS — E43 Unspecified severe protein-calorie malnutrition: Secondary | ICD-10-CM | POA: Diagnosis not present

## 2019-12-30 DIAGNOSIS — E8809 Other disorders of plasma-protein metabolism, not elsewhere classified: Secondary | ICD-10-CM | POA: Diagnosis not present

## 2019-12-30 DIAGNOSIS — K224 Dyskinesia of esophagus: Secondary | ICD-10-CM | POA: Diagnosis not present

## 2019-12-30 LAB — BPAM RBC
Blood Product Expiration Date: 202105142359
Blood Product Expiration Date: 202105142359
ISSUE DATE / TIME: 202104092101
Unit Type and Rh: 5100
Unit Type and Rh: 5100

## 2019-12-30 LAB — TYPE AND SCREEN
ABO/RH(D): O POS
Antibody Screen: POSITIVE
Donor AG Type: NEGATIVE
Donor AG Type: NEGATIVE
Unit division: 0
Unit division: 0

## 2019-12-30 LAB — CBC WITH DIFFERENTIAL/PLATELET
Abs Immature Granulocytes: 0.02 10*3/uL (ref 0.00–0.07)
Basophils Absolute: 0 10*3/uL (ref 0.0–0.1)
Basophils Relative: 0 %
Eosinophils Absolute: 0 10*3/uL (ref 0.0–0.5)
Eosinophils Relative: 1 %
HCT: 23.1 % — ABNORMAL LOW (ref 36.0–46.0)
Hemoglobin: 7.3 g/dL — ABNORMAL LOW (ref 12.0–15.0)
Immature Granulocytes: 0 %
Lymphocytes Relative: 8 %
Lymphs Abs: 0.4 10*3/uL — ABNORMAL LOW (ref 0.7–4.0)
MCH: 31.2 pg (ref 26.0–34.0)
MCHC: 31.6 g/dL (ref 30.0–36.0)
MCV: 98.7 fL (ref 80.0–100.0)
Monocytes Absolute: 0.5 10*3/uL (ref 0.1–1.0)
Monocytes Relative: 9 %
Neutro Abs: 4.1 10*3/uL (ref 1.7–7.7)
Neutrophils Relative %: 82 %
Platelets: 240 10*3/uL (ref 150–400)
RBC: 2.34 MIL/uL — ABNORMAL LOW (ref 3.87–5.11)
RDW: 16.2 % — ABNORMAL HIGH (ref 11.5–15.5)
WBC: 5.1 10*3/uL (ref 4.0–10.5)
nRBC: 0 % (ref 0.0–0.2)

## 2019-12-30 LAB — RETICULOCYTES
Immature Retic Fract: 15.8 % (ref 2.3–15.9)
RBC.: 2.32 MIL/uL — ABNORMAL LOW (ref 3.87–5.11)
Retic Count, Absolute: 84.9 10*3/uL (ref 19.0–186.0)
Retic Ct Pct: 3.7 % — ABNORMAL HIGH (ref 0.4–3.1)

## 2019-12-30 LAB — COMPREHENSIVE METABOLIC PANEL
ALT: 20 U/L (ref 0–44)
AST: 18 U/L (ref 15–41)
Albumin: 1.1 g/dL — ABNORMAL LOW (ref 3.5–5.0)
Alkaline Phosphatase: 66 U/L (ref 38–126)
Anion gap: 7 (ref 5–15)
BUN: 20 mg/dL (ref 8–23)
CO2: 25 mmol/L (ref 22–32)
Calcium: 7.2 mg/dL — ABNORMAL LOW (ref 8.9–10.3)
Chloride: 103 mmol/L (ref 98–111)
Creatinine, Ser: 0.5 mg/dL (ref 0.44–1.00)
GFR calc Af Amer: >60 mL/min (ref >60)
GFR calc non Af Amer: >60 mL/min (ref >60)
Glucose, Bld: 101 mg/dL — ABNORMAL HIGH (ref 70–99)
Potassium: 3.3 mmol/L — ABNORMAL LOW (ref 3.5–5.1)
Sodium: 135 mmol/L (ref 135–145)
Total Bilirubin: 0.3 mg/dL (ref 0.3–1.2)
Total Protein: 4.6 g/dL — ABNORMAL LOW (ref 6.5–8.1)

## 2019-12-30 LAB — LACTATE DEHYDROGENASE: LDH: 137 U/L (ref 98–192)

## 2019-12-30 LAB — GLUCOSE, CAPILLARY
Glucose-Capillary: 116 mg/dL — ABNORMAL HIGH (ref 70–99)
Glucose-Capillary: 82 mg/dL (ref 70–99)

## 2019-12-30 LAB — MAGNESIUM: Magnesium: 1.6 mg/dL — ABNORMAL LOW (ref 1.7–2.4)

## 2019-12-30 LAB — PHOSPHORUS: Phosphorus: 2.7 mg/dL (ref 2.5–4.6)

## 2019-12-30 MED ORDER — PRO-STAT SUGAR FREE PO LIQD
30.0000 mL | Freq: Every day | ORAL | Status: DC
  Administered 2020-01-01: 11:00:00 30 mL via ORAL
  Filled 2019-12-30 (×2): qty 30

## 2019-12-30 MED ORDER — BOOST / RESOURCE BREEZE PO LIQD CUSTOM
1.0000 | Freq: Two times a day (BID) | ORAL | Status: DC
  Administered 2019-12-31 – 2020-01-01 (×2): 1 via ORAL

## 2019-12-30 MED ORDER — MAGNESIUM SULFATE 2 GM/50ML IV SOLN
2.0000 g | Freq: Once | INTRAVENOUS | Status: AC
  Administered 2019-12-30: 2 g via INTRAVENOUS
  Filled 2019-12-30: qty 50

## 2019-12-30 MED ORDER — POTASSIUM CHLORIDE CRYS ER 20 MEQ PO TBCR
40.0000 meq | EXTENDED_RELEASE_TABLET | Freq: Once | ORAL | Status: AC
  Administered 2019-12-30: 40 meq via ORAL
  Filled 2019-12-30: qty 2

## 2019-12-30 NOTE — Progress Notes (Addendum)
Family Medicine Teaching Service Daily Progress Note Intern Pager: [phone]  Patient name: Deanna Cunningham Medical record number: [id number] Date of birth: 21-Mar-1946 Age: 74 y.o. Gender: female  Primary Care Provider: Kristie Cowman, MD Consultants: GI Code Status: Full  Pt Overview and Major Events to Date:  03/26-Admitted 04/01-PICC inserted   Assessment and Plan: Deanna Cunningham is a 74 y.o. female who presented w/ global weakness, weight loss.  PMHx s/f malnutrition, RA (naive to care), chronic anemia, GERD.  Underweight, severe protein caloriemalnutrition: stable Esophageal dysmotility:  Stable.  Patient reports feeling better today.  States that appetite has improved.  RN reports good p.o. intake.  TPN discontinued.  Magnesium this morning 1.6 was replaced with 2 g IV x2.  Potassium 3.3 was replaced with 40 mEq KCl x2 p.o. Phos 2.7.  Prealbumin 11.3 today was 5. 0 on 04/05.  LDH normal 137.  Absolute reticulocyte count 84.9 WNL, reticulocyte count percentage 3.7 slightly elevated.  Haptoglobin and vitamin B1 pending -Dietitian following, recommendations appreciated -Continue multivitamin -TPN discontinued -Thiamine 200 mg daily -Daily weights -Strict I's and O's -CMP, mag, Phos in a.m. -PT/OT reevaluation yesterday and now appropriate for CIR -CIR consult placed 04/12 follow-up recommendations -Social work following  Sinus tachycardia Stable.  Heart rate overnight 60-85.  RN reports remittent sinus tach to 120s 130s.  Patient denies any chest pain, shortness of breath or heart palpitations. -Continue to monitor -Consult cardiology if symptomatic.  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  Stable.  Patient follows with Dr. Nechama Guard at heart care outpatient -Avoid QT prolongating medication  Normocytic, hypochromic anemia:  Chronic.  Stable.  Hemoglobin steady at 7.3 today.  Patient asymptomatic.  No obvious signs of bleeding. -Patient will need follow-up with  heme-onc outpatient -CBC daily  GERD:  Chronic.  Stable.  Home medication famotidine, sulcrafate Protonix -Continue famotidine 20 mg twice daily -Continue Protonix 40 mg daily -Continue Sulcrafate with meals and bedtime  Sacral wound: stable, improved. Stable. -Routine wound care -Turn in position every 2 hours -Encourage out of bed -PT/OT following  Rheumatoid arthritis Chronic.  Stable. -Follow-up with neurology outpatient  FEN/GI: -Dysphagia 1 diet  Prophylaxis: -Lovenox  Disposition: DT/OT recommends CIR, awaiting evaluation and approval  Subjective:  No acute events overnight.  Patient reports slept well and no concerns.  Denies any shortness of breath, chest pain, nausea vomiting or abdominal pain.  Objective: Temp:  [98.3 F (36.8 C)-99.4 F (37.4 C)] 98.6 F (37 C) (04/13 0405) Pulse Rate:  [60-124] 68 (04/13 0405) Resp:  [17-26] 20 (04/13 0405) BP: (87-110)/(41-58) 105/41 (04/13 0405) SpO2:  [97 %-100 %] 99 % (04/13 0405)   Physical Exam: General: Alert and oriented, no apparent distress   Cardiovascular: RRR with no murmurs noted Respiratory: CTA bilaterally  Abdomen: bowel sounds present. No abdominal pain Extremities: Upper thighs edematous, no lower extremity edema.  Laboratory: Recent Labs  Lab 12/28/19 1116 12/29/19 0322 12/30/19 0346  WBC 5.6 5.3 5.1  HGB 8.0* 7.3* 7.3*  HCT 24.8* 23.4* 23.1*  PLT 213 208 240   Recent Labs  Lab 12/28/19 0406 12/29/19 0322 12/30/19 0346  NA 139 137 135  K 4.0 3.9 3.3*  CL 107 105 103  CO2 28 25 25   BUN 17 19 20   CREATININE 0.46 0.51 0.50  CALCIUM 7.1* 7.3* 7.2*  PROT 4.6* 4.7* 4.6*  BILITOT 0.4 0.5 0.3  ALKPHOS 73 71 66  ALT 28 23 20   AST 29 21 18   GLUCOSE 93 97  101*      Imaging/Diagnostic Tests: No results found.   Carollee Leitz, MD 12/30/2019, 6:47 AM PGY-1, [place] Intern pager: [phone], text pages welcome

## 2019-12-30 NOTE — Progress Notes (Signed)
Inpatient Rehab Admissions:  Inpatient Rehab Consult received.  I met with patient and her daughter at the bedside for rehabilitation assessment and to discuss goals and expectations of an inpatient rehab admission.  They are both hopeful for CIR and pt appears to be a good candidate.  Will need insurance authorization from [place], which I will work on today.   Signed: Shann Medal, PT, DPT Admissions Coordinator [phone] 12/30/19  2:21 PM

## 2019-12-30 NOTE — Progress Notes (Signed)
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  Ongoing  GOAL:   Patient will meet greater than or equal to 90% of their needs  Met  MONITOR:   PO intake, Supplement acceptance, Diet advancement, Labs, Weight trends, Skin, I & O's  REASON FOR ASSESSMENT:   Consult Calorie Count  ASSESSMENT:   Deanna Cunningham is a 73 y.o. female presenting with weakness. PMH is significant for Rheumatoid Arthritis (no tx, no rheumatologist), chronic anemia and GERD.  4/2- PICC placed 4/5- TPN initiated 4/10- TPN d/c  Reviewed I.O's: +600 ml x 24 hours and +8.4 L since 12/06/19  Attempted to speak with pt via phone, however, no answer.  Pt continues to have good oral intake; consuming 50-100% of meals and is also very compliant with supplements. She remains on a dysphagia diet due to history of esophageal stricture (plan to dilation once nutritional status improves). Reviewed intkae over the past 24 hours (meals and supplements)- pt consuming approximately 2454 kcals and 144 grams protein, exceeding calorie and protein needs.   Pt and family hopeful for CIR admission once bed is available.   Labs reviewed: K: 3.3, Mg: 1.6, Phos WDL. CBGS: 82-116.   Diet Order:   Diet Order            DIET - DYS 1 Room service appropriate? Yes with Assist; Fluid consistency: Thin  Diet effective now              EDUCATION NEEDS:   Education needs have been  addressed  Skin:  Skin Assessment: Skin Integrity Issues: Skin Integrity Issues:: Stage II Stage II: coccyx Other: MASD to groin, labia, buttocks, and sacrum  Last BM:  12/29/19  Height:   Ht Readings from Last 1 Encounters:  12/27/19 5' 4" (1.626 m)    Weight:   Wt Readings from Last 1 Encounters:  12/27/19 50.7 kg    Ideal Body Weight:  54.5 kg  BMI:  Body mass index is 19.19 kg/m.  Estimated Nutritional Needs:   Kcal:  1750-1950  Protein:  90-105 grams  Fluid:  > 1.7 L    [name], RD, LDN, CDCES Registered Dietitian II Certified Diabetes Care and Education Specialist Please refer to AMION for RD and/or RD on-call/weekend/after hours pager 

## 2019-12-30 NOTE — Progress Notes (Signed)
Occupational Therapy Treatment Patient Details Name: Deanna Cunningham MRN: [id number] DOB: 04-28-1946 Today's Date: 12/30/2019    History of present illness Pt is a 74 y.o. female admitted 12/12/19 with progressive weakness, poor PO intake and BLE swelling; worked up for malnutrition (concern for malignancy). Pt also with anemia, electrolyte imbalance, stage 1 sacral wound. Pt with recurrent asymptomatic SVT; may occur in the settings of severe malnutrition and electrolyte imbalance. PMH includes dysphagia, CKD III, anemia, RA.   OT comments  Updated plan of care this session to CIR, pt would be a great candidate for CIR. Pt is highly motivated to work with therapy, demonstrated good carry over of HEP and reports adherence to HEP during the week. Pt is making good progress toward therapy goals, goals have been updated this date as well. Pt currently requires minA for LB dressing with use of AE, she requires modA to progress into standing and minA for ambulation in the room. Pt continues to required continued therapy services due to decreased activity tolerance, decreased endurance, and decreased strength impacting her safety and independence with ADL/IADL and functional mobility. Pt will continue to benefit from skilled OT services to maximize safety and independence with ADL/IADL and functional mobility. Will continue to follow acutely and progress as tolerated.    Follow Up Recommendations  CIR;Supervision/Assistance - 24 hour    Equipment Recommendations  3 in 1 bedside commode;Tub/shower bench    Recommendations for Other Services      Precautions / Restrictions Precautions Precautions: Fall;Other (comment) Precaution Comments: Bowel incontinence, bilateral hand/finger RA, stage I sacral wound Restrictions Weight Bearing Restrictions: No       Mobility Bed Mobility Overal bed mobility: Needs Assistance Bed Mobility: Supine to Sit     Supine to sit: Min guard;HOB elevated      General bed mobility comments: increased time and effort  Transfers Overall transfer level: Needs assistance Equipment used: Rolling walker (2 wheeled) Transfers: Sit to/from Stand Sit to Stand: Mod assist;From elevated surface         General transfer comment: modA to powerup from elevated surface    Balance Overall balance assessment: Needs assistance Sitting-balance support: Feet supported Sitting balance-Leahy Scale: Fair Sitting balance - Comments: Unable to reach bilateral feet sitting in recliner   Standing balance support: Bilateral upper extremity supported;During functional activity Standing balance-Leahy Scale: Poor Standing balance comment: requires UE support                            ADL either performed or assessed with clinical judgement   ADL Overall ADL's : Needs assistance/impaired                     Lower Body Dressing: Minimal assistance;Sit to/from stand;With adaptive equipment Lower Body Dressing Details (indicate cue type and reason): use of AE to assist with donning/doffing socks, pt required increased time and effort with minA to pull sock aide off foot when donning socks Toilet Transfer: Minimal assistance;RW;Ambulation Toilet Transfer Details (indicate cue type and reason): simulated Toileting- Clothing Manipulation and Hygiene: Moderate assistance;Sit to/from stand Toileting - Clothing Manipulation Details (indicate cue type and reason): from elevated surface, pt incontinent of urine assist for pericare     Functional mobility during ADLs: Moderate assistance;Rolling walker General ADL Comments: pt limited by decreased activity tolerance HR up to 125bpm with LB dressing while seated with use of AE     Vision  Perception     Praxis      Cognition Arousal/Alertness: Awake/alert Behavior During Therapy: WFL for tasks assessed/performed Overall Cognitive Status: Within Functional Limits for tasks assessed                                  General Comments: AE to asssit with LB dressing and good carry over of fine motor activities        Exercises Exercises: Other exercises Other Exercises Other Exercises: Continued to educate pt on use of level 2 theraband with loops to place band over wrists, educated her to self assess and if exercises become stressful to stop and rest Other Exercises: Pt instructed in shoulder flexion to 90* with good aligment of joint and 10 second holds.  She performed x 7 before fatiguing  Other Exercises: Pt provided with tan (super soft) theraputty and was instructed in exercises for grip and pinch    Shoulder Instructions       General Comments HR 90 at rest, up to 125 with LB dressing while seated in recliner    Pertinent Vitals/ Pain       Pain Assessment: No/denies pain  Home Living                                          Prior Functioning/Environment              Frequency  Min 2X/week        Progress Toward Goals  OT Goals(current goals can now be found in the care plan section)  Progress towards OT goals: Progressing toward goals  Acute Rehab OT Goals Patient Stated Goal: to be independent with AE OT Goal Formulation: With patient Time For Goal Achievement: 01/13/20 Potential to Achieve Goals: Good ADL Goals Pt Will Perform Grooming: standing;sitting;with set-up Pt Will Perform Upper Body Bathing: with set-up;sitting Pt Will Perform Lower Body Bathing: sit to/from stand;with adaptive equipment;with min guard assist Pt Will Perform Upper Body Dressing: with set-up;sitting Pt Will Perform Lower Body Dressing: sit to/from stand;with adaptive equipment;with min assist Pt Will Transfer to Toilet: ambulating;regular height toilet;bedside commode;grab bars;with supervision Pt Will Perform Toileting - Clothing Manipulation and hygiene: sit to/from stand;with min guard assist Pt/caregiver will Perform Home Exercise  Program: Increased strength;Right Upper extremity;Left upper extremity;With Supervision;With written HEP provided  Plan Discharge plan remains appropriate    Co-evaluation                 AM-PAC OT "6 Clicks" Daily Activity     Outcome Measure   Help from another person eating meals?: A Little Help from another person taking care of personal grooming?: A Little Help from another person toileting, which includes using toliet, bedpan, or urinal?: A Lot Help from another person bathing (including washing, rinsing, drying)?: A Lot Help from another person to put on and taking off regular upper body clothing?: A Little Help from another person to put on and taking off regular lower body clothing?: A Little 6 Click Score: 16    End of Session Equipment Utilized During Treatment: Rolling walker;Gait belt  OT Visit Diagnosis: Unsteadiness on feet (R26.81);Repeated falls (R29.6);Muscle weakness (generalized) (M62.81)   Activity Tolerance Patient limited by fatigue   Patient Left with call bell/phone within reach;in chair;with chair alarm set   Nurse Communication Mobility  status        Time: 1640-1710 OT Time Calculation (min): 30 min  Charges: OT General Charges $OT Visit: 1 Visit OT Treatments $Self Care/Home Management : 8-22 mins $Therapeutic Exercise: 8-22 mins  Helene Kelp OTR/L Acute Rehabilitation Services Office: [place] 12/30/2019, 5:21 PM

## 2019-12-31 DIAGNOSIS — E43 Unspecified severe protein-calorie malnutrition: Secondary | ICD-10-CM | POA: Diagnosis not present

## 2019-12-31 DIAGNOSIS — K224 Dyskinesia of esophagus: Secondary | ICD-10-CM | POA: Diagnosis not present

## 2019-12-31 LAB — COMPREHENSIVE METABOLIC PANEL
ALT: 20 U/L (ref 0–44)
AST: 16 U/L (ref 15–41)
Albumin: 1.2 g/dL — ABNORMAL LOW (ref 3.5–5.0)
Alkaline Phosphatase: 62 U/L (ref 38–126)
Anion gap: 6 (ref 5–15)
BUN: 22 mg/dL (ref 8–23)
CO2: 25 mmol/L (ref 22–32)
Calcium: 7.1 mg/dL — ABNORMAL LOW (ref 8.9–10.3)
Chloride: 103 mmol/L (ref 98–111)
Creatinine, Ser: 0.45 mg/dL (ref 0.44–1.00)
GFR calc Af Amer: >60 mL/min (ref >60)
GFR calc non Af Amer: >60 mL/min (ref >60)
Glucose, Bld: 128 mg/dL — ABNORMAL HIGH (ref 70–99)
Potassium: 3.7 mmol/L (ref 3.5–5.1)
Sodium: 134 mmol/L — ABNORMAL LOW (ref 135–145)
Total Bilirubin: 0.3 mg/dL (ref 0.3–1.2)
Total Protein: 5.2 g/dL — ABNORMAL LOW (ref 6.5–8.1)

## 2019-12-31 LAB — CBC WITH DIFFERENTIAL/PLATELET
Abs Immature Granulocytes: 0.03 10*3/uL (ref 0.00–0.07)
Basophils Absolute: 0 10*3/uL (ref 0.0–0.1)
Basophils Relative: 0 %
Eosinophils Absolute: 0 10*3/uL (ref 0.0–0.5)
Eosinophils Relative: 0 %
HCT: 24.1 % — ABNORMAL LOW (ref 36.0–46.0)
Hemoglobin: 7.5 g/dL — ABNORMAL LOW (ref 12.0–15.0)
Immature Granulocytes: 1 %
Lymphocytes Relative: 6 %
Lymphs Abs: 0.4 10*3/uL — ABNORMAL LOW (ref 0.7–4.0)
MCH: 30.4 pg (ref 26.0–34.0)
MCHC: 31.1 g/dL (ref 30.0–36.0)
MCV: 97.6 fL (ref 80.0–100.0)
Monocytes Absolute: 0.5 10*3/uL (ref 0.1–1.0)
Monocytes Relative: 8 %
Neutro Abs: 5.4 10*3/uL (ref 1.7–7.7)
Neutrophils Relative %: 85 %
Platelets: 277 10*3/uL (ref 150–400)
RBC: 2.47 MIL/uL — ABNORMAL LOW (ref 3.87–5.11)
RDW: 15.9 % — ABNORMAL HIGH (ref 11.5–15.5)
WBC: 6.3 10*3/uL (ref 4.0–10.5)
nRBC: 0 % (ref 0.0–0.2)

## 2019-12-31 LAB — HAPTOGLOBIN: Haptoglobin: 101 mg/dL (ref 42–346)

## 2019-12-31 LAB — PHOSPHORUS: Phosphorus: 2.6 mg/dL (ref 2.5–4.6)

## 2019-12-31 LAB — MAGNESIUM: Magnesium: 2 mg/dL (ref 1.7–2.4)

## 2019-12-31 MED ORDER — ALUM & MAG HYDROXIDE-SIMETH 200-200-20 MG/5ML PO SUSP
30.0000 mL | Freq: Once | ORAL | Status: AC
  Administered 2019-12-31: 30 mL via ORAL
  Filled 2019-12-31: qty 30

## 2019-12-31 MED ORDER — ONDANSETRON 4 MG PO TBDP
4.0000 mg | ORAL_TABLET | Freq: Once | ORAL | Status: DC
  Filled 2019-12-31: qty 1

## 2019-12-31 MED ORDER — LIDOCAINE VISCOUS HCL 2 % MT SOLN
15.0000 mL | Freq: Once | OROMUCOSAL | Status: AC
  Administered 2019-12-31: 15 mL via ORAL
  Filled 2019-12-31: qty 15

## 2019-12-31 MED ORDER — MAGNESIUM SULFATE 2 GM/50ML IV SOLN
2.0000 g | Freq: Once | INTRAVENOUS | Status: AC
  Administered 2019-12-31: 2 g via INTRAVENOUS
  Filled 2019-12-31: qty 50

## 2019-12-31 NOTE — Progress Notes (Signed)
Physical Therapy Treatment Patient Details Name: Deanna Cunningham MRN: [id number] DOB: 1946/05/10 Today's Date: 12/31/2019    History of Present Illness 74 y.o. female admitted 12/12/19 with progressive weakness, poor PO intake and BLE swelling; worked up for malnutrition (concern for malignancy). Pt also with anemia, electrolyte imbalance, stage 1 sacral wound. Pt with recurrent asymptomatic SVT; may occur in the settings of severe malnutrition and electrolyte imbalance. PMH includes dysphagia, CKD III, anemia, RA.    PT Comments    Pt was seen for mobility and was able to walk a longer trip with lesser assist and better balance control despite weak and edematous RLE.  Follow up with her for progression of her needs, with CIR hopefully planning to support completion of rehab to get home.  Will require more work on STS since her RLE is not helpful to clear the bed, and will continue to instruct sequence of safe transfers and gait.   Follow Up Recommendations  CIR;Supervision/Assistance - 24 hour     Equipment Recommendations  3in1 (PT)    Recommendations for Other Services OT consult     Precautions / Restrictions Precautions Precautions: Fall Precaution Comments: Bowel incontinence, bilateral hand/finger RA, stage I sacral wound Restrictions Weight Bearing Restrictions: No    Mobility  Bed Mobility Overal bed mobility: Needs Assistance Bed Mobility: Supine to Sit;Sit to Supine(scooting up in bed)     Supine to sit: Min assist Sit to supine: Min assist(to support legs)   General bed mobility comments: scooting with min assist and bed in trendelenburg  Transfers Overall transfer level: Needs assistance Equipment used: Rolling walker (2 wheeled) Transfers: Sit to/from Stand Sit to Stand: Min assist         General transfer comment: min to power up  Ambulation/Gait Ambulation/Gait assistance: Min guard Gait Distance (Feet): 80 Feet Assistive device: Rolling walker  (2 wheeled) Gait Pattern/deviations: Step-through pattern;Decreased stride length;Wide base of support;Decreased weight shift to right Gait velocity: Decreased Gait velocity interpretation: <1.31 ft/sec, indicative of household ambulator General Gait Details: improving quality of gait   Stairs             Wheelchair Mobility    Modified Rankin (Stroke Patients Only)       Balance Overall balance assessment: Needs assistance Sitting-balance support: Feet supported Sitting balance-Leahy Scale: Good     Standing balance support: Bilateral upper extremity supported;During functional activity Standing balance-Leahy Scale: Fair Standing balance comment: less than fair dynamically standing                            Cognition Arousal/Alertness: Awake/alert Behavior During Therapy: WFL for tasks assessed/performed Overall Cognitive Status: Within Functional Limits for tasks assessed                                        Exercises      General Comments        Pertinent Vitals/Pain Pain Assessment: No/denies pain    Home Living                      Prior Function            PT Goals (current goals can now be found in the care plan section) Acute Rehab PT Goals Patient Stated Goal: independent gait Progress towards PT goals: Progressing toward goals  Frequency    Min 3X/week      PT Plan Discharge plan needs to be updated    Co-evaluation              AM-PAC PT "6 Clicks" Mobility   Outcome Measure  Help needed turning from your back to your side while in a flat bed without using bedrails?: A Little Help needed moving from lying on your back to sitting on the side of a flat bed without using bedrails?: A Little Help needed moving to and from a bed to a chair (including a wheelchair)?: A Little Help needed standing up from a chair using your arms (e.g., wheelchair or bedside chair)?: A Little Help needed  to walk in hospital room?: A Little Help needed climbing 3-5 steps with a railing? : A Lot 6 Click Score: 17    End of Session Equipment Utilized During Treatment: Gait belt Activity Tolerance: Patient tolerated treatment well;Patient limited by fatigue Patient left: in bed;with call bell/phone within reach;with bed alarm set Nurse Communication: Mobility status PT Visit Diagnosis: Other abnormalities of gait and mobility (R26.89);Muscle weakness (generalized) (M62.81);Difficulty in walking, not elsewhere classified (R26.2)     Time: [id number] PT Time Calculation (min) (ACUTE ONLY): 24 min  Charges:  $Gait Training: 8-22 mins $Therapeutic Activity: 8-22 mins                   Ramond Dial 12/31/2019, 5:12 PM  Mee Hives, PT MS Acute Rehab Dept. Number: [place] [facility] [place]

## 2019-12-31 NOTE — Plan of Care (Signed)
  Problem: Education: Goal: Knowledge of General Education information will improve Description: Including pain rating scale, medication(s)/side effects and non-pharmacologic comfort measures 12/31/2019 0100 by Josepha Pigg, RN Outcome: Progressing 12/31/2019 0057 by Josepha Pigg, RN Outcome: Progressing   Problem: Health Behavior/Discharge Planning: Goal: Ability to manage health-related needs will improve 12/31/2019 0100 by Josepha Pigg, RN Outcome: Progressing 12/31/2019 0057 by Josepha Pigg, RN Outcome: Progressing   Problem: Clinical Measurements: Goal: Ability to maintain clinical measurements within normal limits will improve 12/31/2019 0100 by Josepha Pigg, RN Outcome: Progressing 12/31/2019 0057 by Josepha Pigg, RN Outcome: Progressing Goal: Will remain free from infection 12/31/2019 0100 by Josepha Pigg, RN Outcome: Progressing 12/31/2019 0057 by Josepha Pigg, RN Outcome: Progressing Goal: Diagnostic test results will improve 12/31/2019 0100 by Josepha Pigg, RN Outcome: Progressing 12/31/2019 0057 by Josepha Pigg, RN Outcome: Progressing Goal: Respiratory complications will improve 12/31/2019 0100 by Josepha Pigg, RN Outcome: Progressing 12/31/2019 0057 by Josepha Pigg, RN Outcome: Progressing Goal: Cardiovascular complication will be avoided 12/31/2019 0100 by Josepha Pigg, RN Outcome: Progressing 12/31/2019 0057 by Josepha Pigg, RN Outcome: Progressing   Problem: Activity: Goal: Risk for activity intolerance will decrease 12/31/2019 0100 by Josepha Pigg, RN Outcome: Progressing 12/31/2019 0057 by Josepha Pigg, RN Outcome: Progressing   Problem: Nutrition: Goal: Adequate nutrition will be maintained 12/31/2019 0100 by Josepha Pigg, RN Outcome: Progressing 12/31/2019 0057 by Josepha Pigg, RN Outcome: Progressing   Problem: Coping: Goal: Level of anxiety will  decrease 12/31/2019 0100 by Josepha Pigg, RN Outcome: Progressing 12/31/2019 0057 by Josepha Pigg, RN Outcome: Progressing   Problem: Elimination: Goal: Will not experience complications related to bowel motility 12/31/2019 0100 by Josepha Pigg, RN Outcome: Progressing 12/31/2019 0057 by Josepha Pigg, RN Outcome: Progressing Goal: Will not experience complications related to urinary retention 12/31/2019 0100 by Josepha Pigg, RN Outcome: Progressing 12/31/2019 0057 by Josepha Pigg, RN Outcome: Progressing   Problem: Pain Managment: Goal: General experience of comfort will improve 12/31/2019 0100 by Josepha Pigg, RN Outcome: Progressing 12/31/2019 0057 by Josepha Pigg, RN Outcome: Progressing   Problem: Safety: Goal: Ability to remain free from injury will improve 12/31/2019 0100 by Josepha Pigg, RN Outcome: Progressing 12/31/2019 0057 by Josepha Pigg, RN Outcome: Progressing   Problem: Skin Integrity: Goal: Risk for impaired skin integrity will decrease 12/31/2019 0100 by Josepha Pigg, RN Outcome: Progressing 12/31/2019 0057 by Josepha Pigg, RN Outcome: Progressing

## 2019-12-31 NOTE — Progress Notes (Signed)
Nutrition Follow-up  RD working remotely.  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  Ongoing  GOAL:   Patient will meet greater than or equal to 90% of their needs  Progressing   MONITOR:   PO intake, Supplement acceptance, Diet advancement, Labs, Weight trends, Skin, I & O's  REASON FOR ASSESSMENT:   Consult Calorie Count  ASSESSMENT:   Deanna Cunningham is a 74 y.o. female presenting with weakness. PMH is significant for Rheumatoid Arthritis (no tx, no rheumatologist), chronic anemia and GERD.  4/2- PICC placed 4/5- TPN initiated 4/10- TPN d/c  Reviewed I/O's: +970 ml x 24 hours and 9.4 L since 12/17/19  Attempted to speak with pt via phone, however, no answer.   Pt continues to have good oral intake; consuming 50-100% of meals and is also very compliant with supplements. She remains on a dysphagia diet due to history of esophageal stricture (plan to dilation once nutritional status improves). Reviewed intkae over the past 24 hours (meals and supplements)- pt consuming approximately 1631 kcals and 69 grams protein, meeting 93% of estimated kcal needs and 77% of estimated protein needs.   Pt and family hopeful for CIR admission once bed is available.   Labs reviewed: Na: 134, CBGS: 82-116.   Diet Order:   Diet Order            DIET - DYS 1 Room service appropriate? Yes with Assist; Fluid consistency: Thin  Diet effective now              EDUCATION  NEEDS:   Education needs have been addressed  Skin:  Skin Assessment: Skin Integrity Issues: Skin Integrity Issues:: Stage II Stage II: coccyx Other: MASD to groin, labia, buttocks, and sacrum  Last BM:  12/31/19  Height:   Ht Readings from Last 1 Encounters:  12/27/19 5\' 4"  (1.626 m)    Weight:   Wt Readings from Last 1 Encounters:  12/31/19 50.5 kg    Ideal Body Weight:  54.5 kg  BMI:  Body mass index is 19.11 kg/m.  Estimated Nutritional Needs:   Kcal:  [id number]  Protein:  90-105 grams  Fluid:  > 1.7 L    [name], RD, LDN, [place] Registered Dietitian II Certified Diabetes Care and Education Specialist Please refer to [facility] for RD and/or RD on-call/weekend/after hours pager

## 2019-12-31 NOTE — Progress Notes (Signed)
Family Medicine Teaching Service Daily Progress Note Intern Pager: [phone]  Patient name: Deanna Cunningham Medical record number: [id number] Date of birth: [birth year] Age: 74 y.o. Gender: female  Primary Care Provider: Kristie Cowman, MD Consultants: GI Code Status: Full  Pt Overview and Major Events to Date:  03/26-Admitted 04/01-PICC inserted  Assessment and Plan: Deanna Cunningham is a 74 y.o. female who presented w/ global weakness, weight loss.  PMHx s/f malnutrition, RA (naive to care), chronic anemia, GERD.  Underweight, severe protein caloriemalnutrition: stable Esophageal dysmotility:  Stable.  Patient reports nausea and vomiting.  Started last night.  Reports no abdominal pain.  Has recently moved bowels.  -Dietitian following, recommendations appreciated -Continue multivitamin -TPN discontinued -Thiamine 200 mg daily -Daily weights -Strict I's and O's -CMP, mag, Phos in a.m. -PT/OT reevaluation yesterday and now appropriate for CIR -CIR consult placed 04/12 follow-up recommendations -Social work following  Sinus tachycardia Stable.  Heart rate 89-105 overnight.  Patient denies any chest pain, shortness of breath or heart palpitations. -Continue to monitor -Consult cardiology if symptomatic.  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  Stable.  Patient follows with Deanna Cunningham at heart care outpatient -Avoid QT prolongating medication  Normocytic, hypochromic anemia:  Chronic.  Stable.  Hemoglobin steady at 7.5 today.  Patient asymptomatic.  No obvious signs of bleeding. -Patient will need follow-up with heme-onc outpatient -CBC daily  GERD:  Chronic.  Stable.  Home medication famotidine, sulcrafate Protonix -Continue famotidine 20 mg twice daily -Continue Protonix 40 mg daily -Continue Sulcrafate with meals and bedtime  Sacral wound: stable, improved. Stable. -Routine wound care -Turn in position every 2 hours -Encourage out of bed -PT/OT  following  Rheumatoid arthritis Chronic.  Stable. -Follow-up with neurology outpatient  FEN/GI: -Dysphagia 1 diet  Prophylaxis: -Lovenox  Disposition: DT/OT recommends CIR, awaiting evaluation and approval  Subjective:  No acute events overnight.  Complains of nausea and vomiting without abdominal pain.  Objective: Temp:  [98.3 F (36.8 C)-99.2 F (37.3 C)] 98.3 F (36.8 C) (04/14 0854) Pulse Rate:  [63-97] 89 (04/14 0854) Resp:  [15-17] 16 (04/14 0854) BP: (95-107)/(37-67) 96/57 (04/14 0854) SpO2:  [98 %-100 %] 99 % (04/14 0854) Weight:  [50.5 kg] 50.5 kg (04/14 0452)   Physical Exam: General: Alert and oriented, no apparent distress  Cardiovascular: RRR with no murmurs noted Respiratory: CTA bilaterally  Gastrointestinal: Bowel sounds present. No abdominal pain Extremities:no lower extremity edema   Laboratory: Recent Labs  Lab 12/29/19 0322 12/30/19 0346 12/31/19 0417  WBC 5.3 5.1 6.3  HGB 7.3* 7.3* 7.5*  HCT 23.4* 23.1* 24.1*  PLT 208 240 277   Recent Labs  Lab 12/29/19 0322 12/30/19 0346 12/31/19 0417  NA 137 135 134*  K 3.9 3.3* 3.7  CL 105 103 103  CO2 25 25 25   BUN 19 20 22   CREATININE 0.51 0.50 0.45  CALCIUM 7.3* 7.2* 7.1*  PROT 4.7* 4.6* 5.2*  BILITOT 0.5 0.3 0.3  ALKPHOS 71 66 62  ALT 23 20 20   AST 21 18 16   GLUCOSE 97 101* 128*      Imaging/Diagnostic Tests: No results found.   Deanna Leitz, MD 12/31/2019, 3:36 PM PGY-1, [place] Intern pager: [phone], text pages welcome

## 2019-12-31 NOTE — Plan of Care (Signed)

## 2019-12-31 NOTE — Progress Notes (Signed)
Family Medicine Teaching Service Daily Progress Note Intern Pager: [phone]  Patient name: Deanna Cunningham Medical record number: [id number] Date of birth: 06-21-1946 Age: 74 y.o. Gender: female  Primary Care Provider: Kristie Cowman, MD Consultants: GI Code Status: Full  Pt Overview and Major Events to Date:  03/26-Admitted 04/01-PICC inserted   Assessment and Plan: Deanna Cunningham is a 74 y.o. female who presented w/ global weakness, weight loss.  PMHx s/f malnutrition, RA (naive to care), chronic anemia, GERD.  Underweight, severe protein caloriemalnutrition: stable Esophageal dysmotility:  Stable.  Reports feeling better today.  No more nausea vomiting.  GI cocktail per symptoms from yesterday.  Did not require Zofran was ordered.  Potassium 3. 3, mag 2.0, Phos 2.7.   -Dietitian following, patient would like to change to pured diet. -Continue multivitamin -Thiamine 200 mg daily -Daily weights -Strict I's and O's -CMP, mag, Phos in a.m -K K-Dur 40 mEq x 1 given -Repeat electrolytes as needed. -PT/OT following, recommend CIR -CIR approval pending insurance authorization -Social work following  Sinus tachycardia Stable.  Heart rate overnight 100-111.  Patient does not report any chest pain, shortness of breath. -Continue to monitor -Consult cardiology if symptomatic.  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  Stable.  Patient follows with Dr. Nechama Guard at heart care outpatient -Avoid QT prolongating medication  Normocytic, hypochromic anemia:  Chronic.  Stable.  Hemoglobin steady at 7.2 today.  Patient asymptomatic.  No obvious signs of bleeding. -Patient will need follow-up with heme-onc outpatient -CBC daily  GERD:  Chronic. Stable.  Home medication famotidine, sulcrafate, Protonix -Continue famotidine 20 mg twice daily -Continue Protonix 40 mg daily -Continue Sulcrafate with meals and bedtime -May have GI cocktail at patient's request.  Sacral wound:  stable, improved. Stable. -Routine wound care -Turn in position every 2 hours -Encourage out of bed -PT/OT following  Rheumatoid arthritis Chronic.  Stable. -Follow-up with neurology outpatient  FEN/GI: -Dysphagia 1 diet  Prophylaxis: -Lovenox  Disposition: PT/OT recommends CIR, awaiting evaluation and approval  Subjective:  No acute events overnight.  Feeling better today.  No excessive gas.  Reports that GI cocktail helped heartburn yesterday.  She reports that she thinks this may be from the pured diet.  Otherwise she denies any chest pain, shortness of breath or abdominal pain.  Appetite okay.  Was out of bed yesterday  Objective: Temp:  [98.5 F (36.9 C)-99.2 F (37.3 C)] 98.5 F (36.9 C) (04/14 0342) Pulse Rate:  [60-118] 97 (04/14 0342) Resp:  [15-18] 17 (04/14 0342) BP: (93-107)/(37-67) 105/67 (04/14 0342) SpO2:  [98 %-100 %] 98 % (04/14 0342) Weight:  [50.5 kg] 50.5 kg (04/14 0452)   Physical Exam:  General.  74 year old female in no apparent distress, Cardiovascular: Regular rate and rhythm, no murmurs or gallops appreciated Respiratory: Chest clear to auscultation bilaterally, no wheezes or crackles appreciated, no increased work of breathing. Abdomen: Soft, nontender, nondistended.  Bowel sounds present Extremities: Upper thighs edematous with slight improvement, no lower extremity edema.  Laboratory: Recent Labs  Lab 12/29/19 0322 12/30/19 0346 12/31/19 0417  WBC 5.3 5.1 6.3  HGB 7.3* 7.3* 7.5*  HCT 23.4* 23.1* 24.1*  PLT 208 240 277   Recent Labs  Lab 12/29/19 0322 12/30/19 0346 12/31/19 0417  NA 137 135 134*  K 3.9 3.3* 3.7  CL 105 103 103  CO2 25 25 25   BUN 19 20 22   CREATININE 0.51 0.50 0.45  CALCIUM 7.3* 7.2* 7.1*  PROT 4.7* 4.6* 5.2*  BILITOT 0.5  0.3 0.3  ALKPHOS 71 66 62  ALT 23 20 20   AST 21 18 16   GLUCOSE 97 101* 128*      Imaging/Diagnostic Tests: No results found.   Carollee Leitz, MD 12/31/2019, 6:22 AM PGY-1, [place] Intern pager: [phone], text pages welcome

## 2020-01-01 DIAGNOSIS — E876 Hypokalemia: Secondary | ICD-10-CM | POA: Diagnosis not present

## 2020-01-01 DIAGNOSIS — K224 Dyskinesia of esophagus: Secondary | ICD-10-CM | POA: Diagnosis not present

## 2020-01-01 DIAGNOSIS — R1310 Dysphagia, unspecified: Secondary | ICD-10-CM | POA: Diagnosis not present

## 2020-01-01 DIAGNOSIS — E8809 Other disorders of plasma-protein metabolism, not elsewhere classified: Secondary | ICD-10-CM | POA: Diagnosis not present

## 2020-01-01 LAB — COMPREHENSIVE METABOLIC PANEL
ALT: 17 U/L (ref 0–44)
AST: 14 U/L — ABNORMAL LOW (ref 15–41)
Albumin: 1.1 g/dL — ABNORMAL LOW (ref 3.5–5.0)
Alkaline Phosphatase: 61 U/L (ref 38–126)
Anion gap: 4 — ABNORMAL LOW (ref 5–15)
BUN: 16 mg/dL (ref 8–23)
CO2: 26 mmol/L (ref 22–32)
Calcium: 7.2 mg/dL — ABNORMAL LOW (ref 8.9–10.3)
Chloride: 104 mmol/L (ref 98–111)
Creatinine, Ser: 0.46 mg/dL (ref 0.44–1.00)
GFR calc Af Amer: >60 mL/min (ref >60)
GFR calc non Af Amer: >60 mL/min (ref >60)
Glucose, Bld: 84 mg/dL (ref 70–99)
Potassium: 3.3 mmol/L — ABNORMAL LOW (ref 3.5–5.1)
Sodium: 134 mmol/L — ABNORMAL LOW (ref 135–145)
Total Bilirubin: 0.4 mg/dL (ref 0.3–1.2)
Total Protein: 4.5 g/dL — ABNORMAL LOW (ref 6.5–8.1)

## 2020-01-01 LAB — CBC WITH DIFFERENTIAL/PLATELET
Abs Immature Granulocytes: 0.03 10*3/uL (ref 0.00–0.07)
Basophils Absolute: 0 10*3/uL (ref 0.0–0.1)
Basophils Relative: 1 %
Eosinophils Absolute: 0.1 10*3/uL (ref 0.0–0.5)
Eosinophils Relative: 1 %
HCT: 22.7 % — ABNORMAL LOW (ref 36.0–46.0)
Hemoglobin: 7.2 g/dL — ABNORMAL LOW (ref 12.0–15.0)
Immature Granulocytes: 1 %
Lymphocytes Relative: 8 %
Lymphs Abs: 0.4 10*3/uL — ABNORMAL LOW (ref 0.7–4.0)
MCH: 30.6 pg (ref 26.0–34.0)
MCHC: 31.7 g/dL (ref 30.0–36.0)
MCV: 96.6 fL (ref 80.0–100.0)
Monocytes Absolute: 0.4 10*3/uL (ref 0.1–1.0)
Monocytes Relative: 8 %
Neutro Abs: 4.7 10*3/uL (ref 1.7–7.7)
Neutrophils Relative %: 81 %
Platelets: 304 10*3/uL (ref 150–400)
RBC: 2.35 MIL/uL — ABNORMAL LOW (ref 3.87–5.11)
RDW: 15.7 % — ABNORMAL HIGH (ref 11.5–15.5)
WBC: 5.7 10*3/uL (ref 4.0–10.5)
nRBC: 0 % (ref 0.0–0.2)

## 2020-01-01 LAB — MAGNESIUM: Magnesium: 2 mg/dL (ref 1.7–2.4)

## 2020-01-01 LAB — PHOSPHORUS: Phosphorus: 2.7 mg/dL (ref 2.5–4.6)

## 2020-01-01 MED ORDER — POTASSIUM CHLORIDE CRYS ER 20 MEQ PO TBCR
40.0000 meq | EXTENDED_RELEASE_TABLET | Freq: Once | ORAL | Status: AC
  Administered 2020-01-01: 40 meq via ORAL
  Filled 2020-01-01: qty 2

## 2020-01-01 MED ORDER — BOOST / RESOURCE BREEZE PO LIQD CUSTOM
1.0000 | Freq: Three times a day (TID) | ORAL | Status: DC
  Administered 2020-01-01 – 2020-02-10 (×117): 1 via ORAL

## 2020-01-01 MED ORDER — PRO-STAT SUGAR FREE PO LIQD
30.0000 mL | Freq: Three times a day (TID) | ORAL | Status: DC
  Administered 2020-01-01 – 2020-02-10 (×119): 30 mL via ORAL
  Filled 2020-01-01 (×119): qty 30

## 2020-01-01 NOTE — Progress Notes (Signed)
CCMD charted patient's HR 119.  Full vitals taken after patient used bed pan HR was 113.  Patient in yellow mews.  On call MD notified. Not an acute change.  Will check vitals every 2 hours x 2 then every 4 hours x 4.  Patient is alert and oriented and asymptomatic.

## 2020-01-01 NOTE — Progress Notes (Signed)
Call out for Peer to Peer evaluation. Physician not available at this time.  Scheduled a call back at 1330.  Ref# [id number]  Carollee Leitz MD

## 2020-01-01 NOTE — Progress Notes (Signed)
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  Ongoing  GOAL:   Patient will meet greater than or equal to 90% of their needs  Progressing   MONITOR:   PO intake, Supplement acceptance, Diet advancement, Labs, Weight trends, Skin, I & O's  REASON FOR ASSESSMENT:   Consult Assessment of nutrition requirement/status  ASSESSMENT:   Deanna Cunningham is a 74 y.o. female presenting with weakness. PMH is significant for Rheumatoid Arthritis (no tx, no rheumatologist), chronic anemia and GERD.  4/2- PICC placed 4/5- TPN initiated 4/10- TPN d/c  Reviewed I/O's: +480 ml x 24 hours and +9.2 L since 12/18/19  Spoke with pt at bedside, who complains of having severe acid reflux yesterday with pureed foods. This was resolved with medications. Pt very apprehensive to try solid foods currently due to fear of heartburn. Observed pt consume multiple cups of broth at visit. RD expressed concern of minimal nutrition in beef broth, jello, and applesauce. Attempted to encourage pt to order more nutrient dense foods that she likes, however, he is hesitant to eat anything other than liquids at this time. Pt reports that she will try puree foods within the next few days and is amenable to increase supplement regimen (prior to today, pt was meeting >90% of needs over the past week). Pt amenable to plans.   Updated pt's brother on  nutrition plan and plan of car eon phone per her request.   Pt hopeful for CIR admission.  Labs reviewed: Na: 134, K: 3.3, CBGS: 82-116.   Diet Order:   Diet Order            DIET - DYS 1 Room service appropriate? Yes with Assist; Fluid consistency: Thin  Diet effective now              EDUCATION NEEDS:   Education needs have been addressed  Skin:  Skin Assessment: Skin Integrity Issues: Skin Integrity Issues:: Stage II Stage II: coccyx Other: MASD to groin, labia, buttocks, and sacrum  Last BM:  12/31/19  Height:   Ht Readings from Last 1 Encounters:  12/27/19 5\' 4"  (1.626 m)    Weight:   Wt Readings from Last 1 Encounters:  12/31/19 50.5 kg    Ideal Body Weight:  54.5 kg  BMI:  Body mass index is 19.11 kg/m.  Estimated Nutritional Needs:   Kcal:  [id number]  Protein:  90-105 grams  Fluid:  > 1.7 L    [name], RD, LDN, [place] Registered Dietitian II Certified Diabetes Care and Education Specialist Please refer to [facility] for RD and/or RD on-call/weekend/after hours pager

## 2020-01-01 NOTE — Plan of Care (Signed)
  Problem: Pain Managment: Goal: General experience of comfort will improve Outcome: Progressing   Problem: Safety: Goal: Ability to remain free from injury will improve Outcome: Progressing   Problem: Skin Integrity: Goal: Risk for impaired skin integrity will decrease Outcome: Progressing   

## 2020-01-01 NOTE — Progress Notes (Signed)
Family Medicine Teaching Service Daily Progress Note Intern Pager: [phone]  Patient name: Deanna Cunningham Medical record number: [id number] Date of birth: 07-25-1946 Age: 74 y.o. Gender: female  Primary Care Provider: Kristie Cowman, MD Consultants: GI Code Status: Full  Pt Overview and Major Events to Date:  03/26-Admitted 04/01-PICC inserted   Assessment and Plan: Deanna Cunningham is a 74 y.o. female who presented w/ global weakness, weight loss.  PMHx s/f malnutrition, RA (naive to care), chronic anemia, GERD.  Underweight, severe protein caloriemalnutrition: stable Esophageal dysmotility:  Stable.  Reports she still swollen in her legs.  Vomiting and nausea has resolved.  She spoke with dietitian yesterday who recommended continuing pured diet but increasing supplementation.  Peer-to-peer review for CIR was denied yesterday.  Potassium 3.3 this morning.  Mag 1.6 and Phos 2.9. -Dietitian following, continue pured diet -Continue multivitamins and thiamine  -Daily weight -Strict I's and O's -CMP, mag, Phos in a.m. -Replete electrolytes as needed -TPN as discussed with patient's brother and Dr. Andria Frames. 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  Stable. -Avoid QT prolonging medication  Normocytic, hypochromic anemia:  Chronic.  Stable.  Hemoglobin steady at 7.1 today.  -Patient will need follow-up with heme-onc outpatient -CBC daily  GERD: Chronic. Stable.  Home medication famotidine, sulcrafate, Protonix -Continue famotidine 20 mg twice daily -Continue Protonix 40 mg daily -Continue Sulcrafate with meals and bedtime -May have GI cocktail at patient's request.  Sacral wound: stable, improved. Stable. -Routine wound care -Turn in position every 2 hours -Encourage out of bed -PT/OT following  Rheumatoid arthritis Chronic.  Stable. -Follow-up with neurology outpatient  FEN/GI: -Dysphagia 1  diet  Prophylaxis: -Lovenox  Disposition: PT/OT recommends CIR, awaiting evaluation and approval  Subjective:  No acute events overnight.  Any chest pain, shortness of breath.  Reports no nausea or vomiting today.  GI cocktail helped yesterday.  Objective: Temp:  [97.8 F (36.6 C)-98.6 F (37 C)] 98.6 F (37 C) (04/15 1517) Pulse Rate:  [95-111] 95 (04/15 1517) Resp:  [15-16] 16 (04/15 1517) BP: (91-104)/(50-59) 103/53 (04/15 1517) SpO2:  [93 %-100 %] 100 % (04/15 1517)   Physical Exam:  General: 74 year old female in no apparent distress Cardiovascular: Regular rate and rhythm, no murmurs or gallops appreciated.  Distal pulses present. Respiratory: Chest clear to auscultation bilaterally, no wheezes or crackles heard, cap refill good Abdomen: Soft, nontender, nondistended.  Bowel sounds present. Extremities: Upper thighs continue to be edematous with slight improvement.  No lower extremity edema noted.  Laboratory: Recent Labs  Lab 12/30/19 0346 12/31/19 0417 01/01/20 0428  WBC 5.1 6.3 5.7  HGB 7.3* 7.5* 7.2*  HCT 23.1* 24.1* 22.7*  PLT 240 277 304   Recent Labs  Lab 12/30/19 0346 12/31/19 0417 01/01/20 0428  NA 135 134* 134*  K 3.3* 3.7 3.3*  CL 103 103 104  CO2 25 25 26   BUN 20 22 16   CREATININE 0.50 0.45 0.46  CALCIUM 7.2* 7.1* 7.2*  PROT 4.6* 5.2* 4.5*  BILITOT 0.3 0.3 0.4  ALKPHOS 66 62 61  ALT 20 20 17   AST 18 16 14*  GLUCOSE 101* 128* 84      Imaging/Diagnostic Tests: No results found.   Carollee Leitz, MD 01/01/2020, 6:00 PM PGY-1, [place] Intern pager: [phone], text pages welcome

## 2020-01-01 NOTE — Plan of Care (Signed)
  Problem: Education: Goal: Knowledge of General Education information will improve Description Including pain rating scale, medication(s)/side effects and non-pharmacologic comfort measures Outcome: Progressing   

## 2020-01-01 NOTE — Progress Notes (Signed)
Recevied call for peer to peer review for insurance approval for patient coverage for CIR with Dr. Cletus Gash. Reported that patient was recommended for intensive physical therapy for 3 hours for 3 days/week and was recommended by CIR staff as appropriate for this level of care. CIR staff expressed high likelihood that the patient would be able to return to her independent strategy with this amount of intensive therapy.  Patient will also need to continue with monitoring of her nutritional status (given s/p TPN), physician monitoring of her labs as well as her hemoglobin.  Dr. Suzan Slick stated that on review of her clinical notes from this admission, patient had drastically improved and was medically stable and would not require higher level of care such as CIR and subsequently recommended that patient be discharged to skilled nursing facility followed by home health physical therapy.  I re-emphasized that the patient would not be able to have the same frequency of therapy at a skilled nursing facility to which he responded that the patient was documented to be seemingly very tired and may not be able to handle that duration of therapy. In conclusion, the patient was denied coverage for CIR and recommended for SNF followed by [facility] PT following peer to peer.   Stark Klein, MD  PGY-1, [place] Intern Pager [phone]

## 2020-01-02 DIAGNOSIS — D649 Anemia, unspecified: Secondary | ICD-10-CM | POA: Diagnosis not present

## 2020-01-02 DIAGNOSIS — K224 Dyskinesia of esophagus: Secondary | ICD-10-CM | POA: Diagnosis not present

## 2020-01-02 DIAGNOSIS — R609 Edema, unspecified: Secondary | ICD-10-CM

## 2020-01-02 DIAGNOSIS — B3789 Other sites of candidiasis: Secondary | ICD-10-CM

## 2020-01-02 DIAGNOSIS — E8809 Other disorders of plasma-protein metabolism, not elsewhere classified: Secondary | ICD-10-CM | POA: Diagnosis not present

## 2020-01-02 DIAGNOSIS — K222 Esophageal obstruction: Secondary | ICD-10-CM | POA: Diagnosis not present

## 2020-01-02 DIAGNOSIS — E43 Unspecified severe protein-calorie malnutrition: Secondary | ICD-10-CM | POA: Diagnosis not present

## 2020-01-02 DIAGNOSIS — M069 Rheumatoid arthritis, unspecified: Secondary | ICD-10-CM | POA: Diagnosis not present

## 2020-01-02 LAB — CBC WITH DIFFERENTIAL/PLATELET
Abs Immature Granulocytes: 0.02 10*3/uL (ref 0.00–0.07)
Basophils Absolute: 0 10*3/uL (ref 0.0–0.1)
Basophils Relative: 0 %
Eosinophils Absolute: 0 10*3/uL (ref 0.0–0.5)
Eosinophils Relative: 1 %
HCT: 22.9 % — ABNORMAL LOW (ref 36.0–46.0)
Hemoglobin: 7.1 g/dL — ABNORMAL LOW (ref 12.0–15.0)
Immature Granulocytes: 0 %
Lymphocytes Relative: 10 %
Lymphs Abs: 0.5 10*3/uL — ABNORMAL LOW (ref 0.7–4.0)
MCH: 30.2 pg (ref 26.0–34.0)
MCHC: 31 g/dL (ref 30.0–36.0)
MCV: 97.4 fL (ref 80.0–100.0)
Monocytes Absolute: 0.4 10*3/uL (ref 0.1–1.0)
Monocytes Relative: 8 %
Neutro Abs: 4.2 10*3/uL (ref 1.7–7.7)
Neutrophils Relative %: 81 %
Platelets: 319 10*3/uL (ref 150–400)
RBC: 2.35 MIL/uL — ABNORMAL LOW (ref 3.87–5.11)
RDW: 15.4 % (ref 11.5–15.5)
WBC: 5.3 10*3/uL (ref 4.0–10.5)
nRBC: 0 % (ref 0.0–0.2)

## 2020-01-02 LAB — COMPREHENSIVE METABOLIC PANEL
ALT: 17 U/L (ref 0–44)
AST: 13 U/L — ABNORMAL LOW (ref 15–41)
Albumin: 1.1 g/dL — ABNORMAL LOW (ref 3.5–5.0)
Alkaline Phosphatase: 60 U/L (ref 38–126)
Anion gap: 4 — ABNORMAL LOW (ref 5–15)
BUN: 15 mg/dL (ref 8–23)
CO2: 25 mmol/L (ref 22–32)
Calcium: 7.2 mg/dL — ABNORMAL LOW (ref 8.9–10.3)
Chloride: 104 mmol/L (ref 98–111)
Creatinine, Ser: 0.39 mg/dL — ABNORMAL LOW (ref 0.44–1.00)
GFR calc Af Amer: >60 mL/min (ref >60)
GFR calc non Af Amer: >60 mL/min (ref >60)
Glucose, Bld: 91 mg/dL (ref 70–99)
Potassium: 3.7 mmol/L (ref 3.5–5.1)
Sodium: 133 mmol/L — ABNORMAL LOW (ref 135–145)
Total Bilirubin: 0.2 mg/dL — ABNORMAL LOW (ref 0.3–1.2)
Total Protein: 4.8 g/dL — ABNORMAL LOW (ref 6.5–8.1)

## 2020-01-02 LAB — MAGNESIUM: Magnesium: 1.6 mg/dL — ABNORMAL LOW (ref 1.7–2.4)

## 2020-01-02 LAB — GLUCOSE, CAPILLARY: Glucose-Capillary: 178 mg/dL — ABNORMAL HIGH (ref 70–99)

## 2020-01-02 LAB — PHOSPHORUS: Phosphorus: 2.9 mg/dL (ref 2.5–4.6)

## 2020-01-02 MED ORDER — MAGNESIUM SULFATE 4 GM/100ML IV SOLN
4.0000 g | Freq: Once | INTRAVENOUS | Status: AC
  Administered 2020-01-02: 4 g via INTRAVENOUS
  Filled 2020-01-02: qty 100

## 2020-01-02 NOTE — Progress Notes (Addendum)
PHARMACY - TOTAL PARENTERAL NUTRITION CONSULT NOTE   Indication: malnutrition  Patient Measurements: Height: 5\' 4"  (162.6 cm) Weight: 50.5 kg (111 lb 5.3 oz) IBW/kg (Calculated) : 54.7 TPN AdjBW (KG): 50.7 Body mass index is 19.11 kg/m.  Assessment: 74 yo female initially presented on 3/26 for weakness. Patient with PMH of RA, chronic anemia and GERD. She has had ongoing poor intake since Dec 2020 with LE swelling and unintentional weight loss. Pt previously on TPN from 4/5>4/10 which was stopped since pt had adequate oral intake. However, oral intake has been inconsistent so TPN to be restarted.  Glucose / Insulin: CBGs well controlled, SSI dc'd Electrolytes:  Na 133, K 3.7, Phos 2.9, Mg 1.6 - KPhos neutral TID, Mg 4g x 1 today Renal: SCr 0.39, BUN 15 LFTs / TGs: Trigs 33, LFTs WNL Prealbumin / albumin: Prealbumin 5, Albumin 1.1 Intake / Output; MIVF: UOP 0.32ml/kg/hr, LBM 4/16 GI Imaging: 3/31 - barium swallow - unable to pass tablet - study shows esophageal dysmotility, dilation, and narrowing  4/1 CT abd pelvis - anasarca and ascites; likely underlying liver disease  Surgeries / Procedures: see above GI recommends EGD with dilation but pt needs improved nutritional status   Central access: PICC 4/1 TPN start date: 12/22/2019>>4/10; 4/16>>  Nutritional Goals (per RD recommendation on 4/16): kCal: 1750-1950 per day, Protein: 90-105 g/day, Fluid: > 1.7 L  Current Nutrition:  TPN Dysphagia 1 diet - variable, eating 0-100% of meals Prostat TID - 100 cal, 15 g protein, 10 g carbs (each) Boost BreezeTID - taking consistently, each supplement provides 250kcal and 9g protein  Plan:  TPN ordered after time cutoff so it will be restarted tomorrow Anticipate low rate of TPN as to not overfeed the pt - supplements alone are providing 1050 kcal/day and 42g protein/day, meeting ~50% of patient needs Monitor TPN labs on Mon/Thurs and tomorrow morning F/u ability to maintain on enteral diet  alone since GI tract appears functional  Salome Arnt, PharmD, BCPS Clinical Pharmacist Please see AMION for all pharmacy numbers 01/02/2020 2:33 PM

## 2020-01-02 NOTE — Progress Notes (Signed)
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  Ongoing  GOAL:   Patient will meet greater than or equal to 90% of their needs  Progressing  MONITOR:   PO intake, Supplement acceptance, Diet advancement, Labs, Weight trends, Skin, I & O's  REASON FOR ASSESSMENT:   Consult Assessment of nutrition requirement/status  ASSESSMENT:   Deanna Cunningham is a 74 y.o. female presenting with weakness. PMH is significant for Rheumatoid Arthritis (no tx, no rheumatologist), chronic anemia and GERD.  4/2- PICC placed 4/5- TPN initiated 4/10- TPN d/c  Reviewed I/O's: +330 ml x 24 hours and +9.3 L x 24 hours  Attempted to speak with pt via phone, however, no answer.   Reviewed meal intake. Per discussion with pt yesterday, she was consuming mostly clear liquids due to an episode of heartburn which she felt was caused by eating pureed foods. Reviewed intake over the past 24 hours- pt has consumed approximately 1336 kcals and 59 grams of protein, meeting 76% of estimated kcal needs and 67% of estimated protein needs. Pt understand need to take oral nutrition supplements, which she is compliant with.   Labs reviewed: Na: 134, K: 3.3, CBGS: 178.  Diet Order:   Diet Order            DIET - DYS 1 Room service appropriate? Yes with Assist; Fluid consistency: Thin  Diet effective now              EDUCATION NEEDS:    Education needs have been addressed  Skin:  Skin Assessment: Skin Integrity Issues: Skin Integrity Issues:: Stage II Stage II: coccyx Other: MASD to groin, labia, buttocks, and sacrum  Last BM:  12/31/19  Height:   Ht Readings from Last 1 Encounters:  12/27/19 5\' 4"  (1.626 m)    Weight:   Wt Readings from Last 1 Encounters:  12/31/19 50.5 kg    Ideal Body Weight:  54.5 kg  BMI:  Body mass index is 19.11 kg/m.  Estimated Nutritional Needs:   Kcal:  [id number]  Protein:  90-105 grams  Fluid:  > 1.7 L    [name], RD, LDN, [place] Registered Dietitian II Certified Diabetes Care and Education Specialist Please refer to [facility] for RD and/or RD on-call/weekend/after hours pager

## 2020-01-02 NOTE — TOC Progression Note (Addendum)
Transition of Care [facility]) - Progression Note    Patient Details  Name: Deanna Cunningham MRN: [id number] Date of Birth: July 20, 1946  Transition of Care [facility]) CM/SW Contact  Sharin Mons, RN Phone Number: [phone] 01/02/2020, 12:39 PM  Clinical Narrative:    Pt received denial for CIR. Pt agreeable to SNF placement.  Patient understands that she  is currently unable to care for patient at their home given patient's current physical needs and fall risk. Patient expressed understanding of PT recommendation and is agreeable to SNF placement at time of discharge. Patient reports preference for [facility]. NCM discussed insurance authorization process and provided Medicare SNF ratings list. Patient expressed being hopeful for rehab and to feel better soon. No further questions reported at this time. NCM to continue to follow and assist with discharge planning needs.   Expected Discharge Plan: Skilled Nursing Facility(denied for inpatient rehab.) Barriers to Discharge: Insurance Authorization, SNF Pending bed offer  Expected Discharge Plan and Services Expected Discharge Plan: Skilled Nursing Facility(denied for inpatient rehab.)   Discharge Planning Services: CM Consult   Living arrangements for the past 2 months: Single Family Home      Social Determinants of Health (SDOH) Interventions    Readmission Risk Interventions No flowsheet data found.

## 2020-01-02 NOTE — Progress Notes (Addendum)
Inpatient Rehab Admissions Coordinator:   Notified by [employer] company that peer to peer review was unsuccessful.  Request for CIR was denied.  I will sign off at this time and let pt/family know.    Addendum: Met with pt at bedside to let her know of insurance decision.  Daughter not at bedside yet, so I will try to f/u with her later today.  Pt appreciative.   Shann Medal, PT, DPT Admissions Coordinator [phone] 01/02/20  9:56 AM

## 2020-01-02 NOTE — Progress Notes (Signed)
Patient is back in yellow mews.  BP 100/54 not new and HR 108 not new.  Charge nurse informed. Patient is stable.  Will check vitals again every 2 hours x 2 and then every 4 hours x 4.

## 2020-01-02 NOTE — Progress Notes (Signed)
I saw and examined this patient.  I discussed with the full resident team in rounds.  I will cosign the resident note when available. Also spoke with patient and her physician brother Dr. Charisse March, [phone]. We are trapped in limbo.   Patient remains severely malnourished with an alb=1.1.  GI feels esophageal dilation unsafe without better nutrition.  I broached the idea of PEG tube.  Patient and brother are willing and share the same concern for high risk/poor healing with such poor nutrition. She is only taking 2/3 to 3/4 of needed calories PO on a good day - and she is not having consistent good days.  Plan is to continue oral feed and restart TPN.  We will reconsult GI and consult general surgery.  I would like to have an attending pow wow on Monday 4/19 to come up with a definitive plan for either PEG or esophageal dilitation based on specific parameters.  For example, the plan might be that we continue PO and TPN until albumin rises to 2.0 at which point we will proceed with PEG.  I do not think she is ready for SNF with her current inadequate PO caloric intake.

## 2020-01-02 NOTE — Progress Notes (Signed)
Physical Therapy Treatment Patient Details Name: Deanna Cunningham MRN: [id number] DOB: 1946/02/02 Today's Date: 01/02/2020    History of Present Illness 74 y.o. female admitted 12/12/19 with progressive weakness, poor PO intake and BLE swelling; worked up for malnutrition (concern for malignancy). Pt also with anemia, electrolyte imbalance, stage 1 sacral wound. Pt with recurrent asymptomatic SVT; may occur in the settings of severe malnutrition and electrolyte imbalance. PMH includes dysphagia, CKD III, anemia, RA.   PT Comments    Pt slowly progressing with mobility. Today's session focused on sit<>stand transfers as pt requires significant assist to stand from varying surface heights. Pt requires assist for standing and seated ADL tasks. Remains limited by generalized weakness, decreased activity tolerance and LE pain. Since insurance declined CIR, pt agreeable to SNF-level therapies. Recommend SNF to maximize functional mobility and independence prior to return home.    Follow Up Recommendations  SNF;Supervision/Assistance - 24 hour(CIR declined)     Equipment Recommendations  (TBD)    Recommendations for Other Services       Precautions / Restrictions Precautions Precautions: Fall;Other (comment) Precaution Comments: Bowel incontinence, bilateral hand/finger RA, sacral wound, rectal prolapse (occurred 4/16) Restrictions Weight Bearing Restrictions: No    Mobility  Bed Mobility Overal bed mobility: Needs Assistance Bed Mobility: Supine to Sit     Supine to sit: Supervision     General bed mobility comments: Increased time and effort, cues to use hand rail (heavy reliance on BUE support), no physical assist required  Transfers Overall transfer level: Needs assistance Equipment used: Rolling walker (2 wheeled) Transfers: Sit to/from Stand Sit to Stand: Mod assist         General transfer comment: Performed multiple sit<>stands from EOB, BSC (over toilet), and  repeated trials from recliner - pt requires consistent modA for trunk elevation and maxA to block L foot from sliding; pt able to translate anteriorly, but difficulty pushing LE extension from there to achieve upright posture; heavy modA to elevate trunk and assist to stabilize with transition of UE support to RW  Ambulation/Gait Ambulation/Gait assistance: Min guard Gait Distance (Feet): 40 Feet Assistive device: Rolling walker (2 wheeled) Gait Pattern/deviations: Step-through pattern;Decreased stride length;Wide base of support Gait velocity: Decreased Gait velocity interpretation: <1.31 ft/sec, indicative of household ambulator General Gait Details: Intermittent bouts of unsteadiness with mild L knee buckling; close min guard for balance; 1x seated rest break to void in bathroom   Stairs             Wheelchair Mobility    Modified Rankin (Stroke Patients Only)       Balance Overall balance assessment: Needs assistance Sitting-balance support: Feet supported Sitting balance-Leahy Scale: Fair Sitting balance - Comments: Unable to reach bilateral feet sitting in recliner   Standing balance support: Bilateral upper extremity supported;During functional activity Standing balance-Leahy Scale: Poor Standing balance comment: Reliant on UE support; dependent for perianal care while standing                            Cognition Arousal/Alertness: Awake/alert Behavior During Therapy: WFL for tasks assessed/performed Overall Cognitive Status: Impaired/Different from baseline Area of Impairment: Attention;Following commands;Awareness;Problem solving;Safety/judgement                   Current Attention Level: Selective   Following Commands: Follows one step commands with increased time;Follows multi-step commands inconsistently Safety/Judgement: Decreased awareness of deficits Awareness: Emergent Problem Solving: Requires verbal cues  Exercises       General Comments        Pertinent Vitals/Pain Pain Assessment: Faces Faces Pain Scale: Hurts little more Pain Location: L foot Pain Descriptors / Indicators: Discomfort;Sore;Heaviness Pain Intervention(s): Monitored during session;Repositioned    Home Living                      Prior Function            PT Goals (current goals can now be found in the care plan section) Acute Rehab PT Goals Patient Stated Goal: Agreeable to SNF since insurance declined CIR; recognizes she needs increased assist PT Goal Formulation: With patient Time For Goal Achievement: 01/16/20 Potential to Achieve Goals: Fair Progress towards PT goals: Progressing toward goals    Frequency    Min 2X/week      PT Plan Discharge plan needs to be updated;Frequency needs to be updated    Co-evaluation              AM-PAC PT "6 Clicks" Mobility   Outcome Measure  Help needed turning from your back to your side while in a flat bed without using bedrails?: A Little Help needed moving from lying on your back to sitting on the side of a flat bed without using bedrails?: A Little Help needed moving to and from a bed to a chair (including a wheelchair)?: A Lot Help needed standing up from a chair using your arms (e.g., wheelchair or bedside chair)?: A Lot Help needed to walk in hospital room?: A Little Help needed climbing 3-5 steps with a railing? : A Lot 6 Click Score: 15    End of Session Equipment Utilized During Treatment: Gait belt Activity Tolerance: Patient tolerated treatment well Patient left: in chair;with call bell/phone within reach;with chair alarm set Nurse Communication: Mobility status Pain - part of body: Leg;Ankle and joints of foot     Time: 1133-1206 PT Time Calculation (min) (ACUTE ONLY): 33 min  Charges:  $Therapeutic Activity: 23-37 mins                    Mabeline Caras, PT, DPT Acute Rehabilitation Services  Pager [phone] Office  [place] 01/02/2020, 1:03 PM

## 2020-01-02 NOTE — NC FL2 (Signed)
[place] LEVEL OF CARE SCREENING TOOL     IDENTIFICATION  Patient Name: Deanna Cunningham Birthdate: 10/29/1945 Sex: female Admission Date (Current Location): 12/12/2019  [facility] and [place] Number:  Herbalist and Address:  [facility] [place] [address] [place], [place] [address]      Provider Number: [id number]  Attending Physician Name and Address:  Zenia Resides, MD  Relative Name and Phone Number:       Current Level of Care: Hospital Recommended Level of Care: [place] Prior Approval Number:    Date Approved/Denied:   PASRR Number: [id number]  Discharge Plan: SNF    Current Diagnoses: Patient Active Problem List   Diagnosis Date Noted  . Dysphagia   . Hypomagnesemia   . Protein-calorie malnutrition, severe 12/25/2019  . Hypoalbuminemia   . Pressure injury of skin 12/24/2019  . Protein malnutrition ([place]) 12/19/2019  . Hypokalemia   . Esophageal dysmotility   . Weight loss   . Weakness   . Malnutrition ([place]) 12/12/2019  . Syncope and collapse 05/17/2013  . Right bundle branch block 05/17/2013  . Chronic anemia 05/17/2013  . GERD (gastroesophageal reflux disease) 05/17/2013    Orientation RESPIRATION BLADDER Height & Weight     Self, Time, Place, Situation  Normal Continent Weight: 50.5 kg Height:  5\' 4"  (162.6 cm)  BEHAVIORAL SYMPTOMS/MOOD NEUROLOGICAL BOWEL NUTRITION STATUS      Continent Diet(refer to d/c summary)  AMBULATORY STATUS COMMUNICATION OF NEEDS Skin   Extensive Assist Verbally Other (Comment)(coccyx with stage 2 wound, foam drsg)                       Personal Care Assistance Level of Assistance  Bathing, Feeding, Dressing Bathing Assistance: Limited assistance   Dressing Assistance: Limited assistance     Functional Limitations Info  Speech, Sight, Hearing Sight Info: Adequate Hearing Info: Adequate Speech Info: Adequate    SPECIAL CARE FACTORS FREQUENCY   PT (By licensed PT), OT (By licensed OT)     PT Frequency: 5x/week, evaluate and treat OT Frequency: 5x/week, evaluate and treat            Contractures Contractures Info: Not present    Additional Factors Info  Code Status, Allergies Code Status Info: Full code Allergies Info: Lactose Intolerance (Gi)           Current Medications (01/02/2020):  This is the current hospital active medication list Current Facility-Administered Medications  Medication Dose Route Frequency Provider Last Rate Last Admin  . acetaminophen (TYLENOL) tablet 650 mg  650 mg Oral Q6H PRN Milus Banister C, DO   650 mg at 12/31/19 0009   Or  . acetaminophen (TYLENOL) suppository 650 mg  650 mg Rectal Q6H PRN Daisy Floro, DO      . Chlorhexidine Gluconate Cloth 2 % PADS 6 each  6 each Topical Daily Lind Covert, MD   6 each at 01/02/20 [phone]  . enoxaparin (LOVENOX) injection 30 mg  30 mg Subcutaneous Q24H Mullis, Kiersten P, DO   30 mg at 01/01/20 1617  . famotidine (PEPCID) tablet 20 mg  20 mg Oral BID Hammons, Kimberly B, RPH   20 mg at 01/02/20 [id number]  . feeding supplement (BOOST / RESOURCE BREEZE) liquid 1 Container  1 Container Oral TID BM Zenia Resides, MD   1 Container at 01/02/20 [phone]  . feeding supplement (PRO-STAT SUGAR FREE 64) liquid 30 mL  30 mL Oral TID Zenia Resides, MD   30 mL at 01/02/20 0923  . magic mouthwash  10 mL Oral TID PRN Milus Banister C, DO   10 mL at 12/15/19 1235  . MEDLINE mouth rinse  15 mL Mouth Rinse BID Martyn Malay, MD   15 mL at 01/02/20 0926  . multivitamin with minerals tablet 1 tablet  1 tablet Oral Daily Martyn Malay, MD   1 tablet at 01/02/20 [phone]  . pantoprazole (PROTONIX) EC tablet 40 mg  40 mg Oral Daily [name], MD   40 mg at 01/02/20 0923  . phosphorus (K PHOS NEUTRAL) tablet 250 mg  250 mg Oral TID Mullis, Kiersten P, DO   250 mg at 01/02/20 [id number]  . prochlorperazine (COMPAZINE) tablet 10 mg  10 mg Oral Q6H PRN [name],  MD   10 mg at 12/31/19 0323  . sodium chloride flush (NS) 0.9 % injection 10-40 mL  10-40 mL Intracatheter Q12H Chambliss, Jeb Levering, MD   10 mL at 01/01/20 1036  . sodium chloride flush (NS) 0.9 % injection 10-40 mL  10-40 mL Intracatheter PRN Lind Covert, MD   10 mL at 12/24/19 1700  . sucralfate (CARAFATE) 1 GM/10ML suspension 1 g  1 g Oral TID WC & HS Juanita Craver, MD   1 g at 01/02/20 0924  . thiamine tablet 200 mg  200 mg Oral Daily Higinio Plan, Samantha N, DO   200 mg at 01/02/20 [id number]     Discharge Medications: Please see discharge summary for a list of discharge medications.  Relevant Imaging Results:  Relevant Lab Results:   Additional Information Raynald Kemp (Daughter) [phone]  Sharin Mons, RN

## 2020-01-02 NOTE — Progress Notes (Signed)
IP PROGRESS NOTE  Subjective:  She appears unchanged.  She complains of swelling.  Objective: Vital signs in last 24 hours: Blood pressure (!) 109/58, pulse 100, temperature 98.4 F (36.9 C), temperature source Oral, resp. rate 18, height 5' 4"  (1.626 m), weight 111 lb 5.3 oz (50.5 kg), SpO2 92 %.  Intake/Output from previous day: 04/15 0701 - 04/16 0700 In: 480 [P.O.:480] Out: 150 [Urine:150]  Physical Exam:  Anasarca  Lab Results: Recent Labs    01/01/20 0428 01/02/20 0408  WBC 5.7 5.3  HGB 7.2* 7.1*  HCT 22.7* 22.9*  PLT 304 319   DAT: Negative, reticulocyte count 1.3% BMET Recent Labs    01/01/20 0428 01/02/20 0816  NA 134* 133*  K 3.3* 3.7  CL 104 104  CO2 26 25  GLUCOSE 84 91  BUN 16 15  CREATININE 0.46 0.39*  CALCIUM 7.2* 7.2*    No results found for: CEA1  Studies/Results: No results found.  Medications: I have reviewed the patient's current medications.  Assessment/Plan: 1.  Normocytic anemia-chronic 2.  Rheumatoid arthritis 3.  Severe malnutrition 4.   Oral candidiasis-treated with nystatin 5.   Esophageal dysmotility 6.   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  The anemia is likely secondary to chronic disease and malnutrition.  I will recommend a bone marrow biopsy if the anemia does not improve with correction of the malnutrition.  I will continue to check on her periodically while in the hospital.  Outpatient follow-up will be scheduled in the hematology clinic.  Please call hematology as needed.  Recommendations: 1.  Optimize nutrition 2.  Limit blood draws    LOS: 21 days   Betsy Coder, MD   01/02/2020, 4:05 PM

## 2020-01-03 DIAGNOSIS — K224 Dyskinesia of esophagus: Secondary | ICD-10-CM | POA: Diagnosis not present

## 2020-01-03 DIAGNOSIS — K222 Esophageal obstruction: Secondary | ICD-10-CM | POA: Diagnosis not present

## 2020-01-03 DIAGNOSIS — E876 Hypokalemia: Secondary | ICD-10-CM | POA: Diagnosis not present

## 2020-01-03 DIAGNOSIS — R1310 Dysphagia, unspecified: Secondary | ICD-10-CM | POA: Diagnosis not present

## 2020-01-03 LAB — PREALBUMIN: Prealbumin: 9.2 mg/dL — ABNORMAL LOW (ref 18–38)

## 2020-01-03 LAB — URINALYSIS, ROUTINE W REFLEX MICROSCOPIC
Bilirubin Urine: NEGATIVE
Glucose, UA: NEGATIVE mg/dL
Hgb urine dipstick: NEGATIVE
Ketones, ur: NEGATIVE mg/dL
Nitrite: NEGATIVE
Protein, ur: NEGATIVE mg/dL
Specific Gravity, Urine: 1.012 (ref 1.005–1.030)
pH: 7 (ref 5.0–8.0)

## 2020-01-03 LAB — COMPREHENSIVE METABOLIC PANEL
ALT: 16 U/L (ref 0–44)
AST: 13 U/L — ABNORMAL LOW (ref 15–41)
Albumin: 1.1 g/dL — ABNORMAL LOW (ref 3.5–5.0)
Alkaline Phosphatase: 61 U/L (ref 38–126)
Anion gap: 7 (ref 5–15)
BUN: 15 mg/dL (ref 8–23)
CO2: 25 mmol/L (ref 22–32)
Calcium: 7.3 mg/dL — ABNORMAL LOW (ref 8.9–10.3)
Chloride: 104 mmol/L (ref 98–111)
Creatinine, Ser: 0.5 mg/dL (ref 0.44–1.00)
GFR calc Af Amer: >60 mL/min (ref >60)
GFR calc non Af Amer: >60 mL/min (ref >60)
Glucose, Bld: 82 mg/dL (ref 70–99)
Potassium: 3.1 mmol/L — ABNORMAL LOW (ref 3.5–5.1)
Sodium: 136 mmol/L (ref 135–145)
Total Bilirubin: 0.2 mg/dL — ABNORMAL LOW (ref 0.3–1.2)
Total Protein: 4.6 g/dL — ABNORMAL LOW (ref 6.5–8.1)

## 2020-01-03 LAB — CBC
HCT: 24.4 % — ABNORMAL LOW (ref 36.0–46.0)
Hemoglobin: 7.6 g/dL — ABNORMAL LOW (ref 12.0–15.0)
MCH: 30 pg (ref 26.0–34.0)
MCHC: 31.1 g/dL (ref 30.0–36.0)
MCV: 96.4 fL (ref 80.0–100.0)
Platelets: 334 10*3/uL (ref 150–400)
RBC: 2.53 MIL/uL — ABNORMAL LOW (ref 3.87–5.11)
RDW: 15.2 % (ref 11.5–15.5)
WBC: 5.1 10*3/uL (ref 4.0–10.5)
nRBC: 0 % (ref 0.0–0.2)

## 2020-01-03 LAB — MAGNESIUM: Magnesium: 2 mg/dL (ref 1.7–2.4)

## 2020-01-03 LAB — PHOSPHORUS: Phosphorus: 3.2 mg/dL (ref 2.5–4.6)

## 2020-01-03 LAB — VITAMIN B1: Vitamin B1 (Thiamine): 98.7 nmol/L (ref 66.5–200.0)

## 2020-01-03 LAB — TRIGLYCERIDES: Triglycerides: 38 mg/dL

## 2020-01-03 MED ORDER — STERILE WATER FOR INJECTION IV SOLN
INTRAVENOUS | Status: AC
  Filled 2020-01-03: qty 168

## 2020-01-03 MED ORDER — POTASSIUM CHLORIDE 10 MEQ/50ML IV SOLN
10.0000 meq | INTRAVENOUS | Status: AC
  Administered 2020-01-03 (×2): 10 meq via INTRAVENOUS
  Filled 2020-01-03 (×2): qty 50

## 2020-01-03 MED ORDER — POTASSIUM CHLORIDE 10 MEQ/50ML IV SOLN
10.0000 meq | INTRAVENOUS | Status: AC
  Administered 2020-01-03 (×2): 10 meq via INTRAVENOUS
  Filled 2020-01-03 (×4): qty 50

## 2020-01-03 NOTE — Social Work (Addendum)
Noted pt currently on TPN, will have to f/u with SNFs regarding ability to administer this at facility.  At this time noted that there is no current SNF offers.  CSW continuing to follow for support with disposition when medically appropriate.  Westley Hummer, MSW, [place] Work

## 2020-01-03 NOTE — Progress Notes (Signed)
PHARMACY - TOTAL PARENTERAL NUTRITION CONSULT NOTE   Indication: malnutrition  Patient Measurements: Height: 5\' 4"  (162.6 cm) Weight: 50.5 kg (111 lb 5.3 oz) IBW/kg (Calculated) : 54.7 TPN AdjBW (KG): 50.7 Body mass index is 19.11 kg/m.  Assessment: 74 yo female initially presented on 3/26 for weakness. Patient with PMH of RA, chronic anemia and GERD. She has had ongoing poor intake since Dec 2020 with LE swelling and unintentional weight loss. Pt previously on TPN from 4/5>4/10 which was stopped since pt had adequate oral intake. However, oral intake has been inconsistent so TPN to be restarted.  Glucose / Insulin: CBGs well controlled, SSI dc'd Electrolytes:  Na 136, K 3.1, Phos 3.2, Mg 2 - KCl 10 meq iv  X 4 Renal: SCr 0.5, BUN 15 LFTs / TGs: Trigs 38, LFTs WNL Prealbumin / albumin: Prealbumin 9.2, Albumin 1.1 Intake / Output; MIVF: UOP 1.7 ml/kg/hr, LBM 4/15 GI Imaging: 3/31 - barium swallow - unable to pass tablet - study shows esophageal dysmotility, dilation, and narrowing  4/1 CT abd pelvis - anasarca and ascites; likely underlying liver disease  Surgeries / Procedures: see above GI recommends EGD with dilation but pt needs improved nutritional status   Central access: PICC 4/1 TPN start date: 12/22/2019>>4/10; 4/16>>  Nutritional Goals (per RD recommendation on 4/16): kCal: 1750-1950 per day, Protein: 90-105 g/day, Fluid: > 1.7 L  Current Nutrition:  TPN Dysphagia 1 diet - variable, eating 0-100% of meals Prostat TID - 100 cal, 15 g protein, 10 g carbs (each) Boost BreezeTID - taking consistently, each supplement provides 250kcal and 9g protein  Plan:  Concentrated TPN @ 30 ml/hr will provide 830 kcals/day and 25 gms/day Oral nutritional supplements are providing 1050 kcal/day and 42g protein/day, meeting ~67% of protein needs and 76% Kcal needs Monitor TPN labs daily for 2 days the Mon/Thurs  KCl 10 meq IV x 4 F/u ability to maintain on enteral diet alone since GI  tract appears functional  Alanda Slim, PharmD, [place] Clinical Pharmacist Please see AMION for all Pharmacists' Contact Phone Numbers 01/03/2020, 7:25 AM

## 2020-01-03 NOTE — Plan of Care (Signed)
  Problem: Education: Goal: Knowledge of General Education information will improve Description Including pain rating scale, medication(s)/side effects and non-pharmacologic comfort measures Outcome: Progressing   

## 2020-01-03 NOTE — Plan of Care (Signed)
  Problem: Education: Goal: Knowledge of General Education information will improve Description: Including pain rating scale, medication(s)/side effects and non-pharmacologic comfort measures Outcome: Progressing   Problem: Safety: Goal: Ability to remain free from injury will improve Outcome: Progressing   

## 2020-01-03 NOTE — Progress Notes (Signed)
Family Medicine Teaching Service Daily Progress Note Intern Pager: [phone]  Patient name: Deanna Cunningham Medical record number: [id number] Date of birth: 1946/05/20 Age: 74 y.o. Gender: female  Primary Care Provider: Kristie Cowman, MD Consultants: GI Code Status: Full  Pt Overview and Major Events to Date:  03/26-Admitted 04/01-PICC inserted  Assessment and Plan:  Deanna Cunningham a 74 y.o.femalewho presented w/ global weakness, weight loss.  PMHx s/f malnutrition, RA (naive to care), chronic anemia, GERD.  Underweight, severe protein caloriemalnutrition: stable Esophageal dysmotility:  Stable.    Still reports leg swelling, most likely due to her severe hypoalbuminemia.  No other complaints today.  No nausea or vomiting.  We will restart TPN today.  Denied CIR, now trying for SNF.  Not quite medically stable for discharge at this point.  Dr. Andria Frames plans to have meeting with GI and general surgery to discuss prognosis and treatment with either PEG or esophageal dilation. -Restart TPN -Dietitian following, appreciate recs -Continue multivitamins and thiamine  -Daily weight -Strict I's and O's -A.m. CMP, mag, Phos in a.m. -Replete electrolytes as needed -PT/OT following, recommending SNF after CIR declined -Social work following  Urinary frequency Last night the patient complained of having to urinate frequently.  Nurse also spoke with me about patient's increased frequency last night.  No dysuria.  Had not started TPN again yet.  Patient states she may have had a little bit more to drink than normal, but not much. 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  Stable.  Patient has a holosystolic 3/6 murmur best heard in the apex -Avoid QT prolonging medication  Normocytic, hypochromic anemia:  Chronic.  Stable.  Hemoglobin steady at  7.6 today.   -Patient will need follow-up with heme-onc outpatient -CBC daily  GERD: Chronic. Stable.  Home medication famotidine, sulcrafate, Protonix -Continue famotidine 20 mg twice daily -Continue Protonix 40 mg daily -Continue Sulcrafate with meals and bedtime -May have GI cocktail at patient's request.  Sacral wound: stable, improved. Stable. -Routine wound care -Turn in position every 2 hours -Encourage out of bed -PT/OT following  Rheumatoid arthritis Chronic.  Stable. -Follow-up with neurology outpatient  FEN/GI: -Dysphagia 1 diet - TPN  Prophylaxis: -Lovenox  Disposition: SNF when medically stable  Subjective:  Patient complains of having to urinate frequently throughout the night last night.  No dysuria.  Also complains of leg swelling/pain, but stated to me that this is chronic with no acute change.  Objective: Temp:  [98 F (36.7 C)-98.7 F (37.1 C)] 98.2 F (36.8 C) (04/17 0235) Pulse Rate:  [97-109] 98 (04/17 0235) Resp:  [17-19] 17 (04/17 0235) BP: (98-132)/(52-78) 127/72 (04/17 0235) SpO2:  [90 %-100 %] 100 % (04/16 2337) Physical Exam: General: Alert, oriented.  Laying in bed.  Appears thin and frail. Cardiovascular: 3/6 holosystolic murmur best appreciated in the apex. Respiratory: Lungs clear to auscultation bilaterally Abdomen: Soft, nontender. Extremities: 2+ pitting edema bilaterally up to the knees  Laboratory: Recent Labs  Lab 01/01/20 0428 01/02/20 0408 01/03/20 0358  WBC 5.7 5.3 5.1  HGB 7.2* 7.1* 7.6*  HCT 22.7* 22.9* 24.4*  PLT 304 319 334   Recent Labs  Lab 01/01/20 0428 01/02/20 0816 01/03/20 0358  NA 134* 133* 136  K 3.3* 3.7 3.1*  CL 104 104 104  CO2 26 25 25   BUN 16 15 15   CREATININE  0.46 0.39* 0.50  CALCIUM 7.2* 7.2* 7.3*  PROT 4.5* 4.8* 4.6*  BILITOT 0.4 0.2* 0.2*  ALKPHOS 61 60 61  ALT 17 17 16   AST 14* 13* 13*  GLUCOSE 84 91 82      Imaging/Diagnostic Tests:   Benay Pike,  MD 01/03/2020, 9:22 AM PGY-2, [place] Intern pager: [phone], text pages welcome

## 2020-01-04 DIAGNOSIS — E8809 Other disorders of plasma-protein metabolism, not elsewhere classified: Secondary | ICD-10-CM | POA: Diagnosis not present

## 2020-01-04 DIAGNOSIS — K222 Esophageal obstruction: Secondary | ICD-10-CM | POA: Diagnosis not present

## 2020-01-04 DIAGNOSIS — K224 Dyskinesia of esophagus: Secondary | ICD-10-CM | POA: Diagnosis not present

## 2020-01-04 LAB — BASIC METABOLIC PANEL
Anion gap: 4 — ABNORMAL LOW (ref 5–15)
BUN: 14 mg/dL (ref 8–23)
CO2: 25 mmol/L (ref 22–32)
Calcium: 7.3 mg/dL — ABNORMAL LOW (ref 8.9–10.3)
Chloride: 106 mmol/L (ref 98–111)
Creatinine, Ser: 0.45 mg/dL (ref 0.44–1.00)
GFR calc Af Amer: >60 mL/min (ref >60)
GFR calc non Af Amer: >60 mL/min (ref >60)
Glucose, Bld: 108 mg/dL — ABNORMAL HIGH (ref 70–99)
Potassium: 3.3 mmol/L — ABNORMAL LOW (ref 3.5–5.1)
Sodium: 135 mmol/L (ref 135–145)

## 2020-01-04 LAB — PHOSPHORUS: Phosphorus: 3.1 mg/dL (ref 2.5–4.6)

## 2020-01-04 LAB — MAGNESIUM: Magnesium: 1.6 mg/dL — ABNORMAL LOW (ref 1.7–2.4)

## 2020-01-04 MED ORDER — STERILE WATER FOR INJECTION IV SOLN
INTRAVENOUS | Status: AC
  Filled 2020-01-04: qty 216

## 2020-01-04 MED ORDER — POTASSIUM CHLORIDE 10 MEQ/50ML IV SOLN
10.0000 meq | INTRAVENOUS | Status: AC
  Administered 2020-01-04 (×4): 10 meq via INTRAVENOUS
  Filled 2020-01-04 (×4): qty 50

## 2020-01-04 MED ORDER — MAGNESIUM SULFATE 2 GM/50ML IV SOLN
2.0000 g | Freq: Once | INTRAVENOUS | Status: AC
  Administered 2020-01-04: 06:00:00 2 g via INTRAVENOUS
  Filled 2020-01-04: qty 50

## 2020-01-04 NOTE — Progress Notes (Signed)
PHARMACY - TOTAL PARENTERAL NUTRITION CONSULT NOTE   Indication: malnutrition  Patient Measurements: Height: 5\' 4"  (162.6 cm) Weight: 54.3 kg (119 lb 11.4 oz) IBW/kg (Calculated) : 54.7 TPN AdjBW (KG): 50.7 Body mass index is 20.55 kg/m.  Assessment: 74 yo female initially presented on 3/26 for weakness. Patient with PMH of RA, chronic anemia and GERD. She has had ongoing poor intake since Dec 2020 with LE swelling and unintentional weight loss. Pt previously on TPN from 4/5>4/10 which was stopped since pt had adequate oral intake. However, oral intake has been inconsistent so TPN to be restarted.  Glucose / Insulin: CBGs well controlled, SSI dc'd Electrolytes:  Na 135, K 3.3, Phos 3.1, Mg 1.6 - KCl 10 meq iv  X 4 + Mag 2 gms IV x 1 Renal: SCr 0.45, BUN 14 LFTs / TGs: Trigs 38, LFTs WNL Prealbumin / albumin: Prealbumin 9.2, Albumin 1.1 Intake / Output; MIVF: UOP 1.7 ml/kg/hr, LBM 4/15 GI Imaging: 3/31 - barium swallow - unable to pass tablet - study shows esophageal dysmotility, dilation, and narrowing  4/1 CT abd pelvis - anasarca and ascites; likely underlying liver disease  Surgeries / Procedures: see above GI recommends EGD with dilation but pt needs improved nutritional status   Central access: PICC 4/1 TPN start date: 12/22/2019>>4/10; 4/16>>  Nutritional Goals (per RD recommendation on 4/16): kCal: 1750-1950 per day, Protein: 90-105 g/day, Fluid: > 1.7 L  Current Nutrition:  TPN Dysphagia 1 diet - variable, eating 0-100% of meals Prostat TID - 100 cal, 15 g protein, 10 g carbs (each) Boost BreezeTID - taking consistently, each supplement provides 250kcal and 9g protein  Plan:  Concentrated TPN @ 30 ml/hr will provide 858 kcals/day and 32.5 gms/day Oral nutritional supplements are providing 1050 kcal/day and 42g protein/day, meeting ~67% of protein needs and 76% Kcal needs TPN Lytes: Incr K and Mag in TPN Monitor TPN labs Mon/Thurs  KCl 10 meq IV x 4 Mag 2 gms IV x 1  (ordered by MD) F/u ability to maintain on enteral diet alone since GI tract appears functional  Alanda Slim, PharmD, [facility] Clinical Pharmacist Please see AMION for all Pharmacists' Contact Phone Numbers 01/04/2020, 8:48 AM

## 2020-01-04 NOTE — Plan of Care (Signed)
  Problem: Education: Goal: Knowledge of General Education information will improve Description Including pain rating scale, medication(s)/side effects and non-pharmacologic comfort measures Outcome: Progressing   

## 2020-01-04 NOTE — Progress Notes (Signed)
FPTS Interim Progress Note  On request of attending Dr. Andria Frames, reached out to GI (Dr. Benson Norway) to discuss long-term plan for patient.  Patient has chronic failure to thrive/severe protein-calorie malnutrition and weakness due to difficulty maintaining adequate nutrition independently.  She also has known esophageal dysmotility, presbyesophagus, and she has narrowing in the distal esophageal lumen shown on barium swallow during this admission.  She has been trialed on p.o. intake and TPN with minimal improvement in her albumin level (most recent Albumin 1.1 on 4/17).  She has shown some weight gain which is reassuring.  She does have an intact gut with no known absorption issues.   There has been discussion for esophageal web dilation once patient's albumin increase although unclear of target goal.  Have also discussed PEG placement for long-term nutritional needs.  Patient is amendable to this.  Discussed with Dr. Benson Norway who notes that he agrees a PEG placement would be ideal.  He is skeptical to do any invasive procedures with her albumin being 1.1. He notes that if there were to be any complications during the procedure, it would not have favorable outcomes.  His goal would be to have an albumin of ~2.5 before he would be comfortable undergoing a procedure.  He did recommend consulting general surgery for evaluation for PEG placement and if they are willing to do the procedure he would agree.  Otherwise patient would likely need to continue TPN long-term with plan to discharge on TPN until albumin is improved enough to do a more invasive procedure safely.  Dr. Benson Norway noted he will be in clinic all day on Monday 4/19, but will be available to discuss further if needed.   General surgery consulted this afternoon. They plan to see patient in the morning. Will follow up recommendations.   Mina Marble [place], DO 01/04/2020, 4:20 PM  PGY-2, [place] Service pager [phone]

## 2020-01-04 NOTE — Progress Notes (Signed)
Family Medicine Teaching Service Daily Progress Note Intern Pager: [phone]  Patient name: Deanna Cunningham Medical record number: [id number] Date of birth: 09/24/1945 Age: 74 y.o. Gender: female  Primary Care Provider: Kristie Cowman, MD Consultants: GI Code Status: Full  Pt Overview and Major Events to Date:  03/26-Admitted 04/01-PICC inserted  Assessment and Plan:  Deanna Cunningham a 74 y.o.femalewho presented w/ global weakness, weight loss.  PMHx s/f malnutrition, RA (naive to care), chronic anemia, GERD.  Underweight, severe protein caloriemalnutrition: stable Esophageal dysmotility:  Stable.  TPN started again yesterday.  Plan to continue TPN through the weekend.  Dr. Andria Frames plans to have meeting with GI and general surgery to discuss prognosis and treatment with either PEG or esophageal dilation.  No new complaints this morning. -Continue TPN -Dietitian following, appreciate recs -Continue multivitamins and thiamine  -Daily weight -Strict I's and O's -A.m. CMP, mag, Phos in a.m. -Replete electrolytes as needed -PT/OT following, recommending SNF after CIR declined -Social work following  Urinary frequency Urinary frequency 2 nights ago.  UA was positive for small leukocytes.  Urine culture pending.  We will plan to treat if urine culture is greater than 100,000 CFU's or if she develops new symptoms. 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  Stable.  Patient has a holosystolic 3/6 murmur best heard in the apex -Avoid QT prolonging medication  Normocytic, hypochromic anemia:  Chronic.  Stable.  Hemoglobin steady at 7.6 today.   -Patient will need follow-up with heme-onc outpatient -CBC daily  GERD: Chronic. Stable.  Home medication famotidine, sulcrafate, Protonix -Continue famotidine 20 mg twice daily -Continue Protonix 40 mg daily -Continue Sulcrafate with meals and bedtime -May have GI cocktail at patient's  request.  Sacral wound: stable, improved. Stable. -Routine wound care -Turn in position every 2 hours -Encourage out of bed -PT/OT following  Rheumatoid arthritis Chronic.  Stable. -Follow-up with neurology outpatient  FEN/GI: -Dysphagia 1 diet - TPN  Prophylaxis: -Lovenox  Disposition: SNF when medically stable  Subjective:  No acute events overnight.  Continues to feel low energy this morning.  Continues to have urinary frequency but denies any new stomach pain or dysuria.  Objective: Temp:  [98 F (36.7 C)-98.3 F (36.8 C)] 98.3 F (36.8 C) (04/17 1953) Pulse Rate:  [89-110] 89 (04/17 1953) Resp:  [18-20] 18 (04/17 1953) BP: (126-140)/(70-125) 126/75 (04/17 1953) SpO2:  [96 %-100 %] 96 % (04/17 1953) Weight:  [54.3 kg] 54.3 kg (04/17 0950) Physical Exam: General: Alert and cooperative and appears to be in no acute distress.  Frail-appearing. Cardio: Normal S1 and S2, no S3 or S4. Rhythm is regula.   Pulm: Clear to auscultation bilaterally, no crackles, wheezing, or diminished breath sounds. Normal respiratory effort Abdomen: Bowel sounds normal. Abdomen soft and non-tender.  Extremities: Diffuse edema.  Noted primarily in dependent regions in her abdomen and sacrum.  Skin is warm, dry.  Strong radial pulse. Neuro: Cranial nerves grossly intact   Laboratory: Recent Labs  Lab 01/01/20 0428 01/02/20 0408 01/03/20 0358  WBC 5.7 5.3 5.1  HGB 7.2* 7.1* 7.6*  HCT 22.7* 22.9* 24.4*  PLT 304 319 334   Recent Labs  Lab 01/01/20 0428 01/02/20 0816 01/03/20 0358  NA 134* 133* 136  K 3.3* 3.7 3.1*  CL 104 104 104  CO2 26 25 25   BUN 16 15 15   CREATININE 0.46 0.39* 0.50  CALCIUM 7.2* 7.2* 7.3*  PROT 4.5* 4.8* 4.6*  BILITOT 0.4 0.2* 0.2*  ALKPHOS  61 60 61  ALT 17 17 16   AST 14* 13* 13*  GLUCOSE 84 91 82     Imaging/Diagnostic Tests: No results found.   Matilde Haymaker, MD 01/04/2020, 5:22 AM PGY-2, [place] Intern pager:  [phone], text pages welcome

## 2020-01-05 DIAGNOSIS — K224 Dyskinesia of esophagus: Secondary | ICD-10-CM | POA: Diagnosis not present

## 2020-01-05 DIAGNOSIS — E876 Hypokalemia: Secondary | ICD-10-CM | POA: Diagnosis not present

## 2020-01-05 DIAGNOSIS — R1310 Dysphagia, unspecified: Secondary | ICD-10-CM | POA: Diagnosis not present

## 2020-01-05 DIAGNOSIS — K222 Esophageal obstruction: Secondary | ICD-10-CM | POA: Diagnosis not present

## 2020-01-05 LAB — COMPREHENSIVE METABOLIC PANEL
ALT: 15 U/L (ref 0–44)
AST: 16 U/L (ref 15–41)
Albumin: 1.1 g/dL — ABNORMAL LOW (ref 3.5–5.0)
Alkaline Phosphatase: 60 U/L (ref 38–126)
Anion gap: 7 (ref 5–15)
BUN: 12 mg/dL (ref 8–23)
CO2: 24 mmol/L (ref 22–32)
Calcium: 7.6 mg/dL — ABNORMAL LOW (ref 8.9–10.3)
Chloride: 106 mmol/L (ref 98–111)
Creatinine, Ser: 0.44 mg/dL (ref 0.44–1.00)
GFR calc Af Amer: >60 mL/min (ref >60)
GFR calc non Af Amer: >60 mL/min (ref >60)
Glucose, Bld: 89 mg/dL (ref 70–99)
Potassium: 4.2 mmol/L (ref 3.5–5.1)
Sodium: 137 mmol/L (ref 135–145)
Total Bilirubin: 0.2 mg/dL — ABNORMAL LOW (ref 0.3–1.2)
Total Protein: 4.7 g/dL — ABNORMAL LOW (ref 6.5–8.1)

## 2020-01-05 LAB — CBC WITH DIFFERENTIAL/PLATELET
Abs Immature Granulocytes: 0.08 10*3/uL — ABNORMAL HIGH (ref 0.00–0.07)
Basophils Absolute: 0 10*3/uL (ref 0.0–0.1)
Basophils Relative: 1 %
Eosinophils Absolute: 0.1 10*3/uL (ref 0.0–0.5)
Eosinophils Relative: 2 %
HCT: 23.5 % — ABNORMAL LOW (ref 36.0–46.0)
Hemoglobin: 7.2 g/dL — ABNORMAL LOW (ref 12.0–15.0)
Immature Granulocytes: 1 %
Lymphocytes Relative: 11 %
Lymphs Abs: 0.6 10*3/uL — ABNORMAL LOW (ref 0.7–4.0)
MCH: 30.3 pg (ref 26.0–34.0)
MCHC: 30.6 g/dL (ref 30.0–36.0)
MCV: 98.7 fL (ref 80.0–100.0)
Monocytes Absolute: 0.6 10*3/uL (ref 0.1–1.0)
Monocytes Relative: 10 %
Neutro Abs: 4.2 10*3/uL (ref 1.7–7.7)
Neutrophils Relative %: 75 %
Platelets: 356 10*3/uL (ref 150–400)
RBC: 2.38 MIL/uL — ABNORMAL LOW (ref 3.87–5.11)
RDW: 15.3 % (ref 11.5–15.5)
WBC: 5.6 10*3/uL (ref 4.0–10.5)
nRBC: 0 % (ref 0.0–0.2)

## 2020-01-05 LAB — TRIGLYCERIDES: Triglycerides: 27 mg/dL (ref <150)

## 2020-01-05 LAB — PREALBUMIN: Prealbumin: 11.1 mg/dL — ABNORMAL LOW (ref 18–38)

## 2020-01-05 LAB — PHOSPHORUS: Phosphorus: 3 mg/dL (ref 2.5–4.6)

## 2020-01-05 LAB — MAGNESIUM: Magnesium: 1.9 mg/dL (ref 1.7–2.4)

## 2020-01-05 MED ORDER — STERILE WATER FOR INJECTION IV SOLN
INTRAVENOUS | Status: AC
  Filled 2020-01-05: qty 216

## 2020-01-05 MED ORDER — NITROFURANTOIN MONOHYD MACRO 100 MG PO CAPS
100.0000 mg | ORAL_CAPSULE | Freq: Two times a day (BID) | ORAL | Status: AC
  Administered 2020-01-05 – 2020-01-09 (×10): 100 mg via ORAL
  Filled 2020-01-05 (×10): qty 1

## 2020-01-05 NOTE — Progress Notes (Signed)
Family Medicine Teaching Service Daily Progress Note Intern Pager: [phone]  Patient name: Deanna Cunningham Medical record number: [id number] Date of birth: 12-01-1945 Age: 74 y.o. Gender: female  Primary Care Provider: Kristie Cowman, MD Consultants: GI, surg  Code Status: Full  Pt Overview and Major Events to Date:  03/26-Admitted 04/01-PICC inserted  Assessment and Plan: Deanna Cunningham a 74 y.o.femalewho presented w/ global weakness, weight loss.  PMHx s/f malnutrition, RA (naive to care), chronic anemia, GERD.  Underweight, severe protein caloriemalnutrition In the setting of known esophageal dysmotility, presbyesophagus Meeting nutritional needs through TPN, restarted on 4/18 pm. Albumin 1.1.  -GI and surgery consulted, Dr. Andria Frames proceeding with conversation today to discuss LT goals including when to proceed with endoscopy procedure +/- PEG (appears optimal goal for albumin would be ~2.5)  -TPN per pharmacy -Dietitian on board, appreciate additional oral supplementation recommendations -Monitor electrolytes, replete as indicated -Daily weights, strict I&O's -PT/OT on board -Daily BMP, mag/Phos, weekly triglycerides  UTI: Acute. > 100,000 gram-negative rod colonies and symptomatic. Afebrile. 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 -Avoid QT prolonging medications  Normocytic anemia: Chronic, stable. Hgb 7.6 on 4/16.  -Monitor CBC every 2 days  -Will need heme on follow-up outpatient, consider a bone marrow biopsy if does not improve with correction of malnutrition in the future   GERD: Chronic, stable. Has been a limiting factor of her oral intake, currently well controlled. -Continue famotidine 20 mg twice daily -Continue Protonix 40 mg daily -Continue Sulcrafate with meals and bedtime -May have GI cocktail at patient's request.  Sacral wound: stable, improved. -Routine wound care -Turn in  position every 2 hours -Encourage out of bed -PT/OT following  Rheumatoid arthritis: Chronic, stable. No current concerns. -Follow with rheumatology outpatient  FEN/GI: -Dysphagia 1 diet - TPN  Prophylaxis: -Lovenox  Disposition: Continue medical therapy and discussion, disposition likely to be more clear by 4/20 (as to discharge to SNF with TPN vs maintain hospitalization)  Subjective:  No acute events overnight.  Feeling stronger.  Reports she cannot do the pured diet, makes her feel nauseous with subsequent vomiting and reflux.  Can tolerate full liquids.  Objective: Temp:  [98 F (36.7 C)-98.6 F (37 C)] 98.6 F (37 C) (04/19 0435) Pulse Rate:  [61-106] 106 (04/19 0435) Resp:  [17-20] 18 (04/19 0435) BP: (101-116)/(57-64) 114/64 (04/19 0435) SpO2:  [93 %-100 %] 100 % (04/19 0435) Physical Exam: General: Alert, NAD, older female chronically ill-appearing, cachectic HEENT: NCAT, MMM Cardiac: Faint heart sounds, RRR, 2/6 holosystolic murmur best heard at the apex Lungs: Clear bilaterally, no increased WOB  Abdomen: soft, non-tender Msk: Moves all extremities spontaneously  Ext: Warm, dry, 2+ pitting edema to the level of knee bilaterally  Laboratory: Recent Labs  Lab 01/01/20 0428 01/02/20 0408 01/03/20 0358  WBC 5.7 5.3 5.1  HGB 7.2* 7.1* 7.6*  HCT 22.7* 22.9* 24.4*  PLT 304 319 334   Recent Labs  Lab 01/01/20 0428 01/01/20 0428 01/02/20 0816 01/03/20 0358 01/04/20 0412  NA 134*   < > 133* 136 135  K 3.3*   < > 3.7 3.1* 3.3*  CL 104   < > 104 104 106  CO2 26   < > 25 25 25   BUN 16   < > 15 15 14   CREATININE 0.46   < > 0.39* 0.50 0.45  CALCIUM 7.2*   < > 7.2* 7.3* 7.3*  PROT 4.5*  --  4.8* 4.6*  --   BILITOT 0.4  --  0.2* 0.2*  --   ALKPHOS 61  --  60 61  --   ALT 17  --  17 16  --   AST 14*  --  13* 13*  --   GLUCOSE 84   < > 91 82 108*   < > = values in this interval not displayed.     Imaging/Diagnostic Tests: No results  found.   Patriciaann Clan, DO 01/05/2020, 7:59 AM PGY-2, [place] Intern pager: [phone], text pages welcome

## 2020-01-05 NOTE — Progress Notes (Addendum)
Nutrition Follow-up  RD working remotely.  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  Ongoing  GOAL:   Patient will meet greater than or equal to 90% of their needs  Met; exceeding needs via TPN, PO intake, and oral nutrition supplements  MONITOR:   PO intake, Supplement acceptance, Diet advancement, Labs, Weight trends, Skin, I & O's  REASON FOR ASSESSMENT:   Consult Assessment of nutrition requirement/status  ASSESSMENT:   Deanna Cunningham is a 74 y.o. female presenting with weakness. PMH is significant for Rheumatoid Arthritis (no tx, no rheumatologist), chronic anemia and GERD.  4/2- PICC placed 4/5- TPN initiated 4/10- TPN d/c 4/17- TPN restarted due to nausea, vomiting, poor oral intake  Reviewed I/O's: -1.8 L x 24 hours and +4.3 L since 12/22/19  UOP: 3.4 L x 24 hours  Attempted to speak with pt via phone, however, no answer.   Pt consuming mostly cream soups, broth, and liquids. TPN was re-started by teaching service on 01/03/20.   Reviewed meal completions over the weekend. Over the past 48 hours, pt has taken in approximately, on average, 1811 kcals and 115 grams protein via PO's and supplements daily, which meets 100% of needs.   Pt also receiving TPN @ 30 ml/hr, which provides 858 kcals and 33 grams protein, meeting 49% of estimated kcal needs and 37% of estimated protein needs.    Pt receiving (via TPN, meals, and supplements) 2669 kcals and 148 grams protein, meeting > 100% of estimated protein needs.   General surgery is recommending g-tube placement via GI. Medical team to follow-up today to better establish plan.   CIR was denied by insurance. Plan for SNF once medically stable.   Labs reviewed: Mg, K, and Phos WDL.   Diet Order:   Diet Order            DIET - DYS 1 Room service appropriate? Yes with Assist; Fluid consistency: Thin  Diet effective now              EDUCATION NEEDS:   Education needs have been addressed  Skin:  Skin Assessment: Skin Integrity Issues: Skin Integrity Issues:: Stage II Stage II: coccyx Other: MASD to groin, labia, buttocks, and sacrum  Last BM:  01/02/20  Height:   Ht Readings from Last 1 Encounters:  12/27/19 _0  (1.626 m)    Weight:   Wt Readings from Last 1 Encounters:  01/03/20 54.3 kg    Ideal Body Weight:  54.5 kg  BMI:  Body mass index is 20.55 kg/m.  Estimated Nutritional Needs:   Kcal:  2671-2458  Protein:  90-105 grams  Fluid:  > 1.7 L    [name], RD, LDN, [place] Registered Dietitian II Certified Diabetes Care and Education Specialist Please refer to [facility] for RD and/or RD on-call/weekend/after hours pager

## 2020-01-05 NOTE — Progress Notes (Signed)
Long, detailed physician to physician conversation with Dr. Benson Norway. Bottom line: Patient will continue on TPN and continued PO intake for the near term.  No procedures until the albumin has significantly improved.  Issues/Considerations. 1. No procedure is currently safe given her current nutritional status. 2. Without endoscopy, it is unclear how much of her inability to eat is stricture versus dysmotility. 3. Regardless, she has demonstrated that she cannot currently achieve adaquate caloric intake PO.   4. There is no clear, proven safe albumin level signifying a safe time to do a procedure.  By extrapolation and admittedly arbitrary, Dr. Benson Norway would feel safe proceding with an EGD when the albumin reaches 2.5. 5. The procedural plan would be first an EGD.  If stricture, dilate and proceed with PO feeds.  If dysmotility, have IR or gen surg place PEG.  Per Dr. Benson Norway, most GI docs no longer insert PEGs.   6. TPN can be administered at home or in a SNF.  We need to investigate this dispo possibility.

## 2020-01-05 NOTE — Progress Notes (Signed)
Notified by tele that patient still has an active order for telemetry dated 12/12/19,on call  provider notified and states to continue with telemetry, patient states that the Dr told her that he will  discontinued tele yesterday 01/04/20 and the nurses are aware of it. D/C order noted in order history.  On call was paged again, awating call back.

## 2020-01-05 NOTE — Progress Notes (Signed)
PHARMACY - TOTAL PARENTERAL NUTRITION CONSULT NOTE   Indication: malnutrition  Patient Measurements: Height: 5' 4"  (162.6 cm) Weight: 54.3 kg (119 lb 11.4 oz) IBW/kg (Calculated) : 54.7 TPN AdjBW (KG): 50.7 Body mass index is 20.55 kg/m.  Assessment: 74 yo female initially presented on 3/26 for weakness. Patient with PMH of RA, chronic anemia and GERD. She has had ongoing poor intake since Dec 2020 with LE swelling and unintentional weight loss. Pt previously on TPN from 4/5>4/10 which was stopped since pt had adequate oral intake. However, oral intake has been inconsistent so TPN to be restarted.  Glucose / Insulin: CBGs well controlled, SSI dc'd Electrolytes: WNL  Renal: SCr 0.44 LFTs / TGs: Trigs 38>27, LFTs WNL Prealbumin / albumin: Prealbumin 9.2>11.1, Albumin 1.1 Intake / Output; MIVF: UOP 2.6 ml/kg/hr, LBM 4/17 GI Imaging: 3/31 - barium swallow - unable to pass tablet - study shows esophageal dysmotility, dilation, and narrowing  4/1 CT abd pelvis - anasarca and ascites; likely underlying liver disease  Surgeries / Procedures: see above GI recommends EGD with dilation but pt needs improved nutritional status   Central access: PICC 4/1 TPN start date: 12/22/2019>>4/10; 4/16>>  Nutritional Goals (per RD recommendation on 4/16): kCal: 1750-1950 per day, Protein: 90-105 g/day, Fluid: > 1.7 L  Current Nutrition:  TPN Dysphagia 1 diet - variable, eating 0-100% of meals Prostat TID - 100 cal, 15 g protein, 10 g carbs (each) x 3 yesterday Boost BreezeTID - taking consistently, each supplement provides 250kcal and 9g protein x 3 yesterday  Plan:  Concentrated TPN @ 30 ml/hr will provide 858 kcals/day and 32.5 gms/day Oral nutritional supplements are providing 1050 kcal/day and 72g protein/day, meeting ~75% of protein needs TPN Lytes: no changes Monitor TPN labs Mon/Thurs   TPN is not appropriate in this patient with an intact GI tract and consistent intake of supplements.  Patient has consistently met 75% of goals with that alone. Please consider dc to prevent overfeeding and/or increased risk of infection with line.  Barth Kirks, PharmD, BCPS, BCCCP Clinical Pharmacist [phone]  Please check AMION for all [place] numbers  01/05/2020 9:00 AM

## 2020-01-05 NOTE — Progress Notes (Signed)
PT Cancellation Note  Patient Details Name: Deanna Cunningham MRN: [id number] DOB: Aug 26, 1946   Cancelled Treatment:    Reason Eval/Treat Not Completed: Other (comment).  Pt is eating her meal and will reattempt as time and pt allow.   Ramond Dial 01/05/2020, 5:05 PM  Mee Hives, PT MS Acute Rehab Dept. Number: [place] and [place]

## 2020-01-05 NOTE — Consult Note (Signed)
  Chart reviewed. Patient with malnutrition and esophageal stricture. An endoscopic procedure will be considerably safer than surgery in light of her significant protein calorie malnutrition. Recommend PEG by GI.  Georganna Skeans, MD, MPH, FACS Please use [email or web address] to contact on call provider

## 2020-01-06 DIAGNOSIS — K224 Dyskinesia of esophagus: Secondary | ICD-10-CM | POA: Diagnosis not present

## 2020-01-06 DIAGNOSIS — K222 Esophageal obstruction: Secondary | ICD-10-CM | POA: Diagnosis not present

## 2020-01-06 DIAGNOSIS — E876 Hypokalemia: Secondary | ICD-10-CM | POA: Diagnosis not present

## 2020-01-06 DIAGNOSIS — R1310 Dysphagia, unspecified: Secondary | ICD-10-CM | POA: Diagnosis not present

## 2020-01-06 LAB — BASIC METABOLIC PANEL
Anion gap: 7 (ref 5–15)
BUN: 16 mg/dL (ref 8–23)
CO2: 23 mmol/L (ref 22–32)
Calcium: 7.5 mg/dL — ABNORMAL LOW (ref 8.9–10.3)
Chloride: 104 mmol/L (ref 98–111)
Creatinine, Ser: 0.44 mg/dL (ref 0.44–1.00)
GFR calc Af Amer: >60 mL/min (ref >60)
GFR calc non Af Amer: >60 mL/min (ref >60)
Glucose, Bld: 123 mg/dL — ABNORMAL HIGH (ref 70–99)
Potassium: 4 mmol/L (ref 3.5–5.1)
Sodium: 134 mmol/L — ABNORMAL LOW (ref 135–145)

## 2020-01-06 LAB — CBC
HCT: 23.9 % — ABNORMAL LOW (ref 36.0–46.0)
Hemoglobin: 7.2 g/dL — ABNORMAL LOW (ref 12.0–15.0)
MCH: 29.6 pg (ref 26.0–34.0)
MCHC: 30.1 g/dL (ref 30.0–36.0)
MCV: 98.4 fL (ref 80.0–100.0)
Platelets: 348 10*3/uL (ref 150–400)
RBC: 2.43 MIL/uL — ABNORMAL LOW (ref 3.87–5.11)
RDW: 15.3 % (ref 11.5–15.5)
WBC: 6.3 10*3/uL (ref 4.0–10.5)
nRBC: 0 % (ref 0.0–0.2)

## 2020-01-06 LAB — URINE CULTURE: Culture: >=100000 — AB

## 2020-01-06 MED ORDER — TRAVASOL 10 % IV SOLN
INTRAVENOUS | Status: AC
  Filled 2020-01-06: qty 324

## 2020-01-06 MED ORDER — STERILE WATER FOR INJECTION IV SOLN
INTRAVENOUS | Status: DC
  Filled 2020-01-06: qty 216

## 2020-01-06 NOTE — Progress Notes (Signed)
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  Ongoing  GOAL:   Patient will meet greater than or equal to 90% of their needs  Met  MONITOR:   PO intake, Supplement acceptance, Diet advancement, Labs, Weight trends, Skin, I & O's  REASON FOR ASSESSMENT:   Consult Assessment of nutrition requirement/status  ASSESSMENT:   Deanna Cunningham is a 74 y.o. female presenting with weakness. PMH is significant for Rheumatoid Arthritis (no tx, no rheumatologist), chronic anemia and GERD.  4/2- PICC placed 4/5- TPN initiated 4/10- TPN d/c 4/17- TPN restarted due to nausea, vomiting, poor oral intake  Reviewed I/O's: -2.2 L x 24 hours and +1.4 L since 12/23/19  UOP: 2.7 L x 24 hours  Attempted to speak with pt via phone, however, no answer.   Per MD notes, plan to continue to TPN until albumin improves. Once albumin has improved, plan to proceed with EGD with dilation with GI. Pt may also require PEG tube in the future if dilation is not successful.   Pt still with minimal intake of trays- she is consuming mostly cream soups, broth, applesauce, and other clear liquids. She remains hesitant to try pureed textured food secondary to heartburn.   Reviewed meal intake over the past 24 hours, pt consuming 1682 kcals and 84 grams protein via meal trays and supplements (96% of estimated kcal  needs and 93% of estimated protein needs). With inclusion pt TPN, pt receiving 2540 kcals and 117 grams protein, meeting >100% of estimated kcal and protein needs  Pt also receiving TPN @ 30 ml/hr, which provides 858 kcals and 33 grams protein, meeting 49% of estimated kcal needs and 37% of estimated protein needs. Per pharmacy note, plan to transition to cyclic TPN today, as pt will require long term TPN.   Labs reviewed: K, Mg, and Phos WDL.   Diet Order:   Diet Order            DIET - DYS 1 Room service appropriate? Yes with Assist; Fluid consistency: Thin  Diet effective now              EDUCATION NEEDS:   Education needs have been addressed  Skin:  Skin Assessment: Skin Integrity Issues: Skin Integrity Issues:: Stage II Stage II: coccyx Other: MASD to groin, labia, buttocks, and sacrum  Last BM:  01/04/20  Height:   Ht Readings from Last 1 Encounters:  12/27/19 _0  (1.626 m)    Weight:   Wt Readings from Last 1 Encounters:  01/03/20 54.3 kg    Ideal Body Weight:  54.5 kg  BMI:  Body mass index is 20.55 kg/m.  Estimated Nutritional Needs:   Kcal:  7673-4193  Protein:  90-105 grams  Fluid:  > 1.7 L    [name], RD, LDN, [place] Registered Dietitian II Certified Diabetes Care and Education Specialist Please refer to [facility] for RD and/or RD on-call/weekend/after hours pager

## 2020-01-06 NOTE — Progress Notes (Addendum)
PHARMACY - TOTAL PARENTERAL NUTRITION CONSULT NOTE   Indication: malnutrition  Patient Measurements: Height: 5\' 4"  (162.6 cm) Weight: 54.3 kg (119 lb 11.4 oz) IBW/kg (Calculated) : 54.7 TPN AdjBW (KG): 50.7 Body mass index is 20.55 kg/m.  Assessment: 74 yo female initially presented on 3/26 for weakness. Patient with PMH of RA, chronic anemia and GERD. She has had ongoing poor intake since Dec 2020 with LE swelling and unintentional weight loss. Pt previously on TPN from 4/5>4/10 which was stopped since pt had adequate oral intake. However, oral intake has been inconsistent so TPN to be restarted.  4/19 - per FMTS patient will remain on TPN until albumin > 2.5 and is able to have EGD with GI  Glucose / Insulin: CBGs well controlled, SSI dc'd Electrolytes: WNL  Renal: SCr 0.44 LFTs / TGs: Trigs 38>27, LFTs WNL Prealbumin / albumin: Prealbumin 9.2>11.1, Albumin 1.1 Intake / Output; MIVF: UOP 2.1 ml/kg/hr, LBM 4/17 GI Imaging: 3/31 - barium swallow - unable to pass tablet - study shows esophageal dysmotility, dilation, and narrowing  4/1 CT abd pelvis - anasarca and ascites; likely underlying liver disease  Surgeries / Procedures: see above GI recommends EGD with dilation but pt needs improved nutritional status   Central access: PICC 4/1 TPN start date: 12/22/2019>>4/10; 4/16>>  Nutritional Goals (per RD recommendation on 4/16): kCal: 1750-1950 per day, Protein: 90-105 g/day, Fluid: > 1.7 L  Current Nutrition:  TPN Dysphagia 1 diet - variable, eating 0-100% of meals Prostat TID - 100 cal, 15 g protein, 10 g carbs (each) x 3 yesterday Boost BreezeTID - taking consistently, each supplement provides 250kcal and 9g protein x 3 yesterday  Plan:  Continue current TPN RX but will convert to cyclic TPN as patient will be dc on long-term TPN (also changing to 10% amino acids as patient does not have CKD) Cycle over 14 hours - 28 mL/hr x 1 hr, 55 mL/hr x 12 hr, then 28 mL/hr x 1 hr Oral  nutritional supplements are providing 1050 kcal/day and 72g protein/day, meeting ~75% of protein needs TPN Lytes: no changes Monitor TPN labs Mon/Thurs  Add Bmet Mg Phos in am  Barth Kirks, PharmD, BCPS, BCCCP Clinical Pharmacist [phone]  Please check AMION for all [place] numbers  01/06/2020 7:33 AM

## 2020-01-06 NOTE — Progress Notes (Signed)
Family Medicine Teaching Service Daily Progress Note Intern Pager: [phone]  Patient name: Deanna Cunningham Medical record number: [id number] Date of birth: Jul 09, 1946 Age: 74 y.o. Gender: female  Primary Care Provider: Kristie Cowman, MD Consultants: GI, surg  Code Status: Full  Pt Overview and Major Events to Date:  03/26-Admitted 04/01-PICC inserted  Assessment and Plan: NEMIAH BUBAR a 74 y.o.femalewho presented w/ global weakness, weight loss.  PMHx s/f malnutrition, RA (naive to care), chronic anemia, GERD.  Underweight, severe protein caloriemalnutrition In the setting of known esophageal dysmotility, presbyesophagus-very severe but hopefully improving with TPN and good p.o. intake Meeting nutritional needs through TPN, restarted on 4/18 pm. Albumin 1.1.  -GI and surgery consulted, Dr. Andria Frames proceeding with conversation today to discuss LT goals including when to proceed with endoscopy procedure +/- PEG (appears optimal goal for albumin would be ~2.5)  -TPN per pharmacy -Dietitian on board, appreciate additional oral supplementation recommendations -Monitor electrolytes, replete as indicated -Daily weights, strict I&O's -PT/OT on board -Daily BMP, mag/Phos, weekly triglycerides  UTI: Acute, patient stable > 100,000 gram-negative rod colonies and symptomatic. Afebrile. 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 -Avoid QT prolonging medications  Normocytic anemia: Chronic, stable Hgb 7.2 on 4/19  -Monitor CBC   -Will need heme on follow-up outpatient, consider a bone marrow biopsy if does not improve with correction of malnutrition in the future   GERD: Chronic, stable Has been a limiting factor of her oral intake, currently well controlled. -Continue famotidine 20 mg twice daily -Continue Protonix 40 mg daily -Continue Sulcrafate with meals and bedtime -May have GI cocktail at patient's  request.  Sacral wound: Stable/improved -Routine wound care -Turn in position every 2 hours -Encourage out of bed -PT/OT following  Rheumatoid arthritis: Chronic, stable. No current concerns. -Follow with rheumatology outpatient  FEN/GI: -Dysphagia 1 diet, patient said that she is doing well on the liquid - TPN  Prophylaxis: -Lovenox  Disposition: Continue medical therapy and discussion, disposition likely to be more clear by 4/20 (as to discharge to SNF with TPN vs maintain hospitalization)  Subjective:  Says she has no complaints as long as she is taking the liquid by mouth and nothing thicker.  Is comfortable with plan to continue using TPN and understands the need to increase her albumin to make herself strong enough for this procedure.  Does understand that she is very ill  Objective: Temp:  [98.3 F (36.8 C)-98.5 F (36.9 C)] 98.5 F (36.9 C) (04/20 0746) Pulse Rate:  [97-108] 108 (04/20 0746) Resp:  [16-19] 16 (04/20 0746) BP: (116-117)/(63-69) 116/69 (04/20 0746) SpO2:  [99 %-100 %] 100 % (04/20 0746) Physical Exam: General: Alert, no acute distress, very cachectic Cardiac: Regular rate and rhythm although difficult to auscultate given faint heart sounds Lungs: No increased work of breathing, no cough, clear to auscultation bilaterally Abdomen: Soft nontender, thin Msk: Cachectic, moving extremities spontaneously  Laboratory: Recent Labs  Lab 01/02/20 0408 01/03/20 0358 01/05/20 0909  WBC 5.3 5.1 5.6  HGB 7.1* 7.6* 7.2*  HCT 22.9* 24.4* 23.5*  PLT 319 334 356   Recent Labs  Lab 01/02/20 0816 01/02/20 0816 01/03/20 0358 01/04/20 0412 01/05/20 0418  NA 133*   < > 136 135 137  K 3.7   < > 3.1* 3.3* 4.2  CL 104   < > 104 106 106  CO2 25   < > 25 25 24   BUN 15   < >  15 14 12   CREATININE 0.39*   < > 0.50 0.45 0.44  CALCIUM 7.2*   < > 7.3* 7.3* 7.6*  PROT 4.8*  --  4.6*  --  4.7*  BILITOT 0.2*  --  0.2*  --  0.2*  ALKPHOS 60  --  61  --  60   ALT 17  --  16  --  15  AST 13*  --  13*  --  16  GLUCOSE 91   < > 82 108* 89   < > = values in this interval not displayed.     Imaging/Diagnostic Tests: No results found.   Sherene Sires, DO 01/06/2020, 1:51 PM PGY-3, [place] Intern pager: [phone], text pages welcome

## 2020-01-06 NOTE — Progress Notes (Signed)
Occupational Therapy Treatment Patient Details Name: SHERNITA HATHWAY MRN: [id number] DOB: 04/11/1946 Today's Date: 01/06/2020    History of present illness 74 y.o. female admitted 12/12/19 with progressive weakness, poor PO intake and BLE swelling; worked up for malnutrition (concern for malignancy). Pt also with anemia, electrolyte imbalance, stage 1 sacral wound. Pt with recurrent asymptomatic SVT; may occur in the settings of severe malnutrition and electrolyte imbalance. PMH includes dysphagia, CKD III, anemia, RA.   OT comments  Pt is continuing to make slow steady progress toward established OT goals and toward prior level of function. Pt finishing PT session upon OT arrival and agreeable to session. Pt required modA to progress into standing from recliner, minA for functional mobility and stability while grooming at sink level. She tolerated standing at sink level with single UE supported for 31min. Recommending d.c to SNF as pt was denied for CIR level therapies. Pt is agreeable to SNF as she recognizes she needs more assistance and time to continue to progress. Will continue to follow acutely.    Follow Up Recommendations  SNF    Equipment Recommendations  3 in 1 bedside commode;Tub/shower bench    Recommendations for Other Services      Precautions / Restrictions Precautions Precautions: Fall;Other (comment) Precaution Comments: Bowel incontinence, bilateral hand/finger RA, sacral wound, rectal prolapse (occurred 4/16) Restrictions Weight Bearing Restrictions: No       Mobility Bed Mobility Overal bed mobility: Needs Assistance             General bed mobility comments: sitting in recliner upon arrival  Transfers Overall transfer level: Needs assistance Equipment used: Rolling walker (2 wheeled) Transfers: Sit to/from Stand Sit to Stand: Mod assist         General transfer comment: modA to progress into standing from recliner    Balance Overall balance  assessment: Needs assistance Sitting-balance support: Feet supported Sitting balance-Leahy Scale: Fair     Standing balance support: Single extremity supported;During functional activity Standing balance-Leahy Scale: Poor Standing balance comment: reliant on single UE support in standing                           ADL either performed or assessed with clinical judgement   ADL Overall ADL's : Needs assistance/impaired Eating/Feeding: Set up Eating/Feeding Details (indicate cue type and reason): setup lunch for pt following session Grooming: Minimal assistance;Standing;Wash/dry hands;Wash/dry face Grooming Details (indicate cue type and reason): at sink level                 Toilet Transfer: Minimal assistance;RW;Ambulation   Toileting- Clothing Manipulation and Hygiene: Moderate assistance;Sit to/from stand Toileting - Clothing Manipulation Details (indicate cue type and reason): modA for posterior care for thorough cleaning     Functional mobility during ADLs: Moderate assistance;Rolling walker General ADL Comments: pt tolerated standing at sink level for 80min for ADL completion, 1 minor LOB pt able to self correct;     Vision       Perception     Praxis      Cognition Arousal/Alertness: Awake/alert Behavior During Therapy: WFL for tasks assessed/performed Overall Cognitive Status: Within Functional Limits for tasks assessed                                 General Comments: WFL for basic mobility and ADL completion        Exercises Other Exercises  Other Exercises: continued to review UE HEP, pt reports completing almost every day   Shoulder Instructions       General Comments pt reports increased edema in BLE with mobility;left pt with legs elevated, nsg is aware    Pertinent Vitals/ Pain       Pain Assessment: No/denies pain  Home Living                                          Prior  Functioning/Environment              Frequency  Min 2X/week        Progress Toward Goals  OT Goals(current goals can now be found in the care plan section)  Progress towards OT goals: Progressing toward goals  Acute Rehab OT Goals Patient Stated Goal: agreeable to SNF, wants to continue to get stronger OT Goal Formulation: With patient Time For Goal Achievement: 01/13/20 Potential to Achieve Goals: Good ADL Goals Pt Will Perform Grooming: standing;sitting;with set-up Pt Will Perform Upper Body Bathing: with set-up;sitting Pt Will Perform Lower Body Bathing: sit to/from stand;with adaptive equipment;with min guard assist Pt Will Perform Upper Body Dressing: with set-up;sitting Pt Will Perform Lower Body Dressing: sit to/from stand;with adaptive equipment;with min assist Pt Will Transfer to Toilet: ambulating;regular height toilet;bedside commode;grab bars;with supervision Pt Will Perform Toileting - Clothing Manipulation and hygiene: sit to/from stand;with min guard assist Pt/caregiver will Perform Home Exercise Program: Increased strength;Right Upper extremity;Left upper extremity;With Supervision;With written HEP provided  Plan Discharge plan needs to be updated    Co-evaluation                 AM-PAC OT "6 Clicks" Daily Activity     Outcome Measure   Help from another person eating meals?: A Little Help from another person taking care of personal grooming?: A Little Help from another person toileting, which includes using toliet, bedpan, or urinal?: A Lot Help from another person bathing (including washing, rinsing, drying)?: A Lot Help from another person to put on and taking off regular upper body clothing?: A Little Help from another person to put on and taking off regular lower body clothing?: A Little 6 Click Score: 16    End of Session Equipment Utilized During Treatment: Rolling walker;Gait belt  OT Visit Diagnosis: Unsteadiness on feet  (R26.81);Repeated falls (R29.6);Muscle weakness (generalized) (M62.81)   Activity Tolerance Patient tolerated treatment well   Patient Left with call bell/phone within reach;in chair;with chair alarm set   Nurse Communication Mobility status        Time: [id number] OT Time Calculation (min): 15 min  Charges: OT General Charges $OT Visit: 1 Visit OT Treatments $Self Care/Home Management : 8-22 mins  Helene Kelp OTR/L Acute Rehabilitation Services Office: [place] 01/06/2020, 3:13 PM

## 2020-01-06 NOTE — Progress Notes (Signed)
Physical Therapy Treatment Patient Details Name: Deanna Cunningham MRN: [id number] DOB: 1946/07/09 Today's Date: 01/06/2020    History of Present Illness Pt is a 74 y.o. female admitted 12/12/19 with progressive weakness, poor PO intake and BLE swelling; worked up for malnutrition (concern for malignancy). Pt also with anemia, electrolyte imbalance, stage 1 sacral wound. Pt with recurrent asymptomatic SVT; may occur in the settings of severe malnutrition and electrolyte imbalance. PMH includes dysphagia, CKD III, anemia, RA.   PT Comments    Pt progressing with mobility; reports did not out of bed yesterday. Today's session focused on LE strengthening with sit<>stand transfer training and increasing gait distance. Pt requires min-modA for mobility with RW. Limited by generalized weakness (specifically L knee instability), decreased activity tolerance and poor balance reactions/postural strategies. Remains motivated to participate. Continue to recommend SNF-level therapies to maximize functional mobility and independence prior to return home.   Follow Up Recommendations  SNF;Supervision/Assistance - 24 hour(CIR declined)     Equipment Recommendations  (defer)    Recommendations for Other Services       Precautions / Restrictions Precautions Precautions: Fall;Other (comment) Precaution Comments: Bowel incontinence, bilateral hand/finger RA, sacral wound, rectal prolapse (occurred 4/16) Restrictions Weight Bearing Restrictions: No    Mobility  Bed Mobility Overal bed mobility: Needs Assistance Bed Mobility: Supine to Sit;Rolling Rolling: Modified independent (Device/Increase time)   Supine to sit: Supervision     General bed mobility comments: Mod indep to roll towards L side with bed rail to remove bed pan; supervision to come to sitting on R-side bed  Transfers Overall transfer level: Needs assistance Equipment used: Rolling walker (2 wheeled) Transfers: Sit to/from  Stand Sit to Stand: Min assist;Mod assist;From elevated surface         General transfer comment: Initial minA to stand from raised bed height to RW, increased time and effort to achieve full extension, L foot sliding with knee instability. ModA for trunk elevation standing from lower recliner height to RW with R foot blocked  Ambulation/Gait Ambulation/Gait assistance: Min guard;Min assist Gait Distance (Feet): 70 Feet Assistive device: Rolling walker (2 wheeled) Gait Pattern/deviations: Step-through pattern;Decreased stride length;Wide base of support Gait velocity: Decreased Gait velocity interpretation: <1.31 ft/sec, indicative of household ambulator General Gait Details: Intermittent bouts of unsteadiness with mild L knee buckling; close min guard for balance; limited by fatigue and reports "it feels like my leg is going to give out"   Stairs             Wheelchair Mobility    Modified Rankin (Stroke Patients Only)       Balance Overall balance assessment: Needs assistance Sitting-balance support: Feet supported Sitting balance-Leahy Scale: Fair Sitting balance - Comments: Can reach bilateral feet to fix socks and stretch hamstrings/back; limited by abdominal soreness/fullness   Standing balance support: Single extremity supported;During functional activity Standing balance-Leahy Scale: Fair Standing balance comment: reliant on single UE support in standing                            Cognition Arousal/Alertness: Awake/alert Behavior During Therapy: WFL for tasks assessed/performed Overall Cognitive Status: Within Functional Limits for tasks assessed                                 General Comments: WFL for basic mobility and ADL completion; not formally assessed      Exercises General  Exercises - Lower Extremity Long Arc Quad: AROM;Both;10 reps Hip Flexion/Marching: AROM;Both;Seated Toe Raises: AROM;Both;Seated Heel Raises:  AROM;Both;Seated Other Exercises Other Exercises: continued to review UE HEP, pt reports completing almost every day    General Comments General comments (skin integrity, edema, etc.): pt reports increased edema in BLE with mobility;left pt with legs elevated, nsg is aware      Pertinent Vitals/Pain Pain Assessment: Faces Faces Pain Scale: Hurts little more Pain Location: L foot with AROM Pain Descriptors / Indicators: Discomfort;Sore;Heaviness Pain Intervention(s): Monitored during session    Home Living                      Prior Function            PT Goals (current goals can now be found in the care plan section) Acute Rehab PT Goals Patient Stated Goal: agreeable to SNF, wants to continue to get stronger Progress towards PT goals: Progressing toward goals    Frequency    Min 2X/week      PT Plan Current plan remains appropriate    Co-evaluation              AM-PAC PT "6 Clicks" Mobility   Outcome Measure  Help needed turning from your back to your side while in a flat bed without using bedrails?: A Little Help needed moving from lying on your back to sitting on the side of a flat bed without using bedrails?: A Little Help needed moving to and from a bed to a chair (including a wheelchair)?: A Lot Help needed standing up from a chair using your arms (e.g., wheelchair or bedside chair)?: A Lot Help needed to walk in hospital room?: A Little Help needed climbing 3-5 steps with a railing? : A Lot 6 Click Score: 15    End of Session Equipment Utilized During Treatment: Gait belt Activity Tolerance: Patient tolerated treatment well Patient left: in chair;with call bell/phone within reach;with chair alarm set(with OT present) Nurse Communication: Mobility status PT Visit Diagnosis: Other abnormalities of gait and mobility (R26.89);Muscle weakness (generalized) (M62.81);Difficulty in walking, not elsewhere classified (R26.2) Pain - part of body:  Leg;Ankle and joints of foot     Time: 1347-1411 PT Time Calculation (min) (ACUTE ONLY): 24 min  Charges:  $Therapeutic Exercise: 8-22 mins $Therapeutic Activity: 8-22 mins                    Mabeline Caras, PT, DPT Acute Rehabilitation Services  Pager [phone] Office [place] 01/06/2020, 4:11 PM

## 2020-01-07 DIAGNOSIS — K224 Dyskinesia of esophagus: Secondary | ICD-10-CM | POA: Diagnosis not present

## 2020-01-07 DIAGNOSIS — R1310 Dysphagia, unspecified: Secondary | ICD-10-CM | POA: Diagnosis not present

## 2020-01-07 DIAGNOSIS — E876 Hypokalemia: Secondary | ICD-10-CM | POA: Diagnosis not present

## 2020-01-07 DIAGNOSIS — K222 Esophageal obstruction: Secondary | ICD-10-CM | POA: Diagnosis not present

## 2020-01-07 LAB — CBC
HCT: 22.5 % — ABNORMAL LOW (ref 36.0–46.0)
Hemoglobin: 7 g/dL — ABNORMAL LOW (ref 12.0–15.0)
MCH: 30.6 pg (ref 26.0–34.0)
MCHC: 31.1 g/dL (ref 30.0–36.0)
MCV: 98.3 fL (ref 80.0–100.0)
Platelets: 328 10*3/uL (ref 150–400)
RBC: 2.29 MIL/uL — ABNORMAL LOW (ref 3.87–5.11)
RDW: 15.3 % (ref 11.5–15.5)
WBC: 5.9 10*3/uL (ref 4.0–10.5)
nRBC: 0 % (ref 0.0–0.2)

## 2020-01-07 LAB — BASIC METABOLIC PANEL
Anion gap: 5 (ref 5–15)
BUN: 18 mg/dL (ref 8–23)
CO2: 25 mmol/L (ref 22–32)
Calcium: 7.6 mg/dL — ABNORMAL LOW (ref 8.9–10.3)
Chloride: 106 mmol/L (ref 98–111)
Creatinine, Ser: 0.38 mg/dL — ABNORMAL LOW (ref 0.44–1.00)
GFR calc Af Amer: >60 mL/min (ref >60)
GFR calc non Af Amer: >60 mL/min (ref >60)
Glucose, Bld: 106 mg/dL — ABNORMAL HIGH (ref 70–99)
Potassium: 4.3 mmol/L (ref 3.5–5.1)
Sodium: 136 mmol/L (ref 135–145)

## 2020-01-07 LAB — MAGNESIUM: Magnesium: 1.8 mg/dL (ref 1.7–2.4)

## 2020-01-07 LAB — PHOSPHORUS: Phosphorus: 3.6 mg/dL (ref 2.5–4.6)

## 2020-01-07 MED ORDER — TRAVASOL 10 % IV SOLN
INTRAVENOUS | Status: AC
  Filled 2020-01-07: qty 324

## 2020-01-07 NOTE — Progress Notes (Signed)
Family Medicine Teaching Service Daily Progress Note Intern Pager: [phone]  Patient name: Deanna Cunningham Medical record number: [id number] Date of birth: 11-29-1945 Age: 74 y.o. Gender: female  Primary Care Provider: Kristie Cowman, MD Consultants: GI, surg  Code Status: Full  Pt Overview and Major Events to Date:  03/26-Admitted 04/01-PICC inserted  Assessment and Plan: SHEYANNE Cunningham a 74 y.o.femalewho presented w/ global weakness, weight loss.  PMHx s/f malnutrition, RA (naive to care), chronic anemia, GERD.  Underweight, severe protein caloriemalnutrition In the setting of known esophageal dysmotility, presbyesophagus-very severe but hopefully improving with TPN and good p.o. intake Stable.  Meeting nutritional needs. -GI and surgery recommends having albumin~2.5 prior to proceeding with PEG tube -TPN per pharmacy -Daily labs for pharmacy -Replete electrolytes as needed -Daily weight -Strict I's and O's -PT/OT  UTI: Acute, patient stable Afebrile. 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 -Avoid QT prolonging medications  Normocytic anemia: Chronic, stable Hgb 7.2 on 4/19  -Monitor CBC   -Will need heme on follow-up outpatient, consider a bone marrow biopsy if does not improve with correction of malnutrition in the future   GERD: Chronic, stable Has been a limiting factor of her oral intake, currently well controlled. -Continue famotidine 20 mg twice daily -Continue Protonix 40 mg daily -Continue Sulcrafate with meals and bedtime -May have GI cocktail at patient's request.  Sacral wound: Stable/improved -Routine wound care -Turn in position every 2 hours -Encourage out of bed -PT/OT following  Rheumatoid arthritis: Chronic, stable. No current concerns. -Follow with rheumatology outpatient  FEN/GI: -Dysphagia 1 diet, patient said that she is doing well  on the liquid - TPN  Prophylaxis: -Lovenox  Disposition: Continue medical therapy and discussion, disposition likely to be more clear by 4/20 (as to discharge to SNF with TPN vs maintain hospitalization)  Subjective:  No acute events overnight. Reports feeling better today especially when on TPN. Denies any chest pain, shortness of breath or abdominal pain. Voiding well. Last BM yesterday.  Objective: Temp:  [98.4 F (36.9 C)-98.5 F (36.9 C)] 98.4 F (36.9 C) (04/20 1925) Pulse Rate:  [108] 108 (04/20 1925) Resp:  [16-19] 19 (04/20 1925) BP: (102-116)/(61-69) 102/61 (04/20 1925) SpO2:  [100 %] 100 % (04/20 1925) Weight:  [54 kg] 54 kg (04/21 0458) Physical Exam:  General: 74 year old female emaciated and frail, in no acute distress Cardiac: Regular rate and rhythm, no murmurs or gallops appreciated Respiratory: Chest clear to auscultation bilaterally, no wheezes or crackles noted, no increased work of breathing Abdomen soft, nontender, nondistended. Bowel sounds present. Extremities: Moving all extremities, continues to have bilateral lower extremity edema.  Laboratory: Recent Labs  Lab 01/05/20 0909 01/06/20 1447 01/07/20 0338  WBC 5.6 6.3 5.9  HGB 7.2* 7.2* 7.0*  HCT 23.5* 23.9* 22.5*  PLT 356 348 328   Recent Labs  Lab 01/02/20 0816 01/02/20 0816 01/03/20 0358 01/04/20 0412 01/05/20 0418 01/06/20 1447 01/07/20 0338  NA 133*   < > 136   < > 137 134* 136  K 3.7   < > 3.1*   < > 4.2 4.0 4.3  CL 104   < > 104   < > 106 104 106  CO2 25   < > 25   < > 24 23 25   BUN 15   < > 15   < > 12 16 18   CREATININE 0.39*   < > 0.50   < >  0.44 0.44 0.38*  CALCIUM 7.2*   < > 7.3*   < > 7.6* 7.5* 7.6*  PROT 4.8*  --  4.6*  --  4.7*  --   --   BILITOT 0.2*  --  0.2*  --  0.2*  --   --   ALKPHOS 60  --  61  --  60  --   --   ALT 17  --  16  --  15  --   --   AST 13*  --  13*  --  16  --   --   GLUCOSE 91   < > 82   < > 89 123* 106*   < > = values in this interval not  displayed.     Imaging/Diagnostic Tests: No results found.   Carollee Leitz, MD 01/07/2020, 6:56 AM PGY-1, [place] Intern pager: [phone], text pages welcome

## 2020-01-07 NOTE — Progress Notes (Signed)
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  Ongoing  GOAL:   Patient will meet greater than or equal to 90% of their needs  Progressing   MONITOR:   PO intake, Supplement acceptance, Diet advancement, Labs, Weight trends, Skin, I & O's  REASON FOR ASSESSMENT:   Consult Assessment of nutrition requirement/status  ASSESSMENT:   Deanna Cunningham is a 74 y.o. female presenting with weakness. PMH is significant for Rheumatoid Arthritis (no tx, no rheumatologist), chronic anemia and GERD.  4/2- PICC placed 4/5- TPN initiated 4/10- TPN d/c 4/17- TPN restarted due to nausea, vomiting, poor oral intake 4/20- transitioned to cyclic TPN  Reviewed I/O's: -2.9 L x 24 hours and -2.9 L since 12/24/19  UOP: 3.4 L x 24 hours  Per MD notes, plan to continue to TPN until albumin improves. Once albumin has improved, plan to proceed with EGD with dilation with GI. Pt may also require PEG tube in the future if dilation is not successful.   Pt still with minimal intake of trays- she is consuming mostly broth and soft drinks. She remains hesitant to try pureed textured food secondary to heartburn.   Reviewed meal intake over the past 24 hours, pt consuming 1230 kcals and 77 grams protein via meal trays and supplements (70% of estimated kcal needs and 86% of estimated protein needs). With  inclusion pt TPN, pt receiving 2088 kcals and 110 grams protein, meeting 100% of estimated kcal and protein needs  Pt also receiving cyclic TPN x 12 hours (33 mL/hr x 1 hr, 65 mL/hr x 12 hr, then 33 mL/hr x 1 hr) which provides 858 kcals and 33 grams protein, meeting 49% of estimated kcal needs and 37% of estimated protein needs.   Labs reviewed.   Diet Order:   Diet Order            DIET - DYS 1 Room service appropriate? Yes with Assist; Fluid consistency: Thin  Diet effective now              EDUCATION NEEDS:   Education needs have been addressed  Skin:  Skin Assessment: Skin Integrity Issues: Skin Integrity Issues:: Stage II Stage II: coccyx Other: MASD to groin, labia, buttocks, and sacrum  Last BM:  01/05/20  Height:   Ht Readings from Last 1 Encounters:  12/27/19 5\' 4"  (1.626 m)    Weight:   Wt Readings from Last 1 Encounters:  01/07/20 54 kg    Ideal Body Weight:  54.5 kg  BMI:  Body mass index is 20.43 kg/m.  Estimated Nutritional Needs:   Kcal:  [id number]  Protein:  90-105 grams  Fluid:  > 1.7 L    [name], RD, LDN, [place] Registered Dietitian II Certified Diabetes Care and Education Specialist Please refer to [facility] for RD and/or RD on-call/weekend/after hours pager

## 2020-01-07 NOTE — Plan of Care (Signed)

## 2020-01-07 NOTE — Progress Notes (Signed)
Family Medicine Teaching Service Daily Progress Note Intern Pager: [phone]  Patient name: Deanna Cunningham Medical record number: [id number] Date of birth: 1945/10/15 Age: 74 y.o. Gender: female  Primary Care Provider: Kristie Cowman, MD Consultants: GI, surg  Code Status: Full  Pt Overview and Major Events to Date:  03/26-Admitted 04/01-PICC inserted  Assessment and Plan: Deanna Cunningham a 74 y.o.femalewho presented w/ global weakness, weight loss.  PMHx s/f malnutrition, RA (naive to care), chronic anemia, GERD.  Underweight, severe protein caloriemalnutrition In the setting of known esophageal dysmotility, presbyesophagus-very severe but hopefully improving with TPN and good p.o. intake Stable.  Meeting nutritional needs. -GI and surgery recommends having albumin~2.5 prior to proceeding with PEG tube -TPN per pharmacy -Daily labs for pharmacy -Replete electrolytes as needed -Magnesium 2 g IV x1 -Daily weight -Strict I's and O's -PT/OT -Dietitian following appreciate recommendations  UTI: Acute, patient stable Afebrile.  No difficulties urinating. 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 -Avoid QT prolonging medication  Normocytic anemia: Chronic, stable Hemoglobin 7.0.  Patient asymptomatic.  Denies any dizziness, shortness of breath or chest pain.  No hematuria or hematochezia -CBC Mondays and Thursdays  GERD: Chronic, stable Currently well controlled.  Reports no acid reflux, abdominal pain, nausea or vomiting.  Reports that she does not like pured diet which aggravates symptoms. -Continue Pepcid 20 mg twice daily -Continue Protonix 40 mg daily -Continue soak her feet with meals and bedtime -May have GI cocktail at patient's request  Sacral wound: Stable/improved -Routine wound care -Position changes frequently every 2 hours -Courage out of bed -PT/OT following  Rheumatoid  arthritis: Chronic, stable. -Follows with rheumatology outpatient   FEN/GI: -Dysphagia 1 diet, patient said that she is doing well on the liquid - TPN  Prophylaxis: -Lovenox  Disposition: Continue medical therapy and discussion, disposition likely to be more clear by 4/20 (as to discharge to SNF with TPN vs maintain hospitalization)  Subjective:  No acute events overnight.  Denies any chest pain, shortness of breath, nausea or vomiting or abdominal pain.  Reports that she does not like the pured diet and would prefer broths instead.  Otherwise feeling well and plans to get out of bed today.  Objective: Temp:  [98.2 F (36.8 C)-98.7 F (37.1 C)] 98.7 F (37.1 C) (04/21 1400) Pulse Rate:  [104-108] 104 (04/21 1400) Resp:  [17-19] 18 (04/21 1400) BP: (102-125)/(61-72) 125/72 (04/21 1400) SpO2:  [99 %-100 %] 100 % (04/21 1400) Weight:  [54 kg] 54 kg (04/21 0458) Physical Exam:  General: 74 year old female, in no acute distress Cardiac: Regular rate and rhythm, no murmurs or gallops appreciated Respiratory: Chest clear to auscultation bilaterally, no wheezes or crackles noted and no increased work of breathing Abdomen.  Soft, nontender, nondistended.  Bowel sounds present Extremities: Moving all extremities and continues to have bilateral lower extremity edema.  Laboratory: Recent Labs  Lab 01/05/20 0909 01/06/20 1447 01/07/20 0338  WBC 5.6 6.3 5.9  HGB 7.2* 7.2* 7.0*  HCT 23.5* 23.9* 22.5*  PLT 356 348 328   Recent Labs  Lab 01/02/20 0816 01/02/20 0816 01/03/20 0358 01/04/20 0412 01/05/20 0418 01/06/20 1447 01/07/20 0338  NA 133*   < > 136   < > 137 134* 136  K 3.7   < > 3.1*   < > 4.2 4.0 4.3  CL 104   < > 104   < > 106 104 106  CO2 25   < >  25   < > 24 23 25   BUN 15   < > 15   < > 12 16 18   CREATININE 0.39*   < > 0.50   < > 0.44 0.44 0.38*  CALCIUM 7.2*   < > 7.3*   < > 7.6* 7.5* 7.6*  PROT 4.8*  --  4.6*  --  4.7*  --   --   BILITOT 0.2*  --  0.2*  --   0.2*  --   --   ALKPHOS 60  --  61  --  60  --   --   ALT 17  --  16  --  15  --   --   AST 13*  --  13*  --  16  --   --   GLUCOSE 91   < > 82   < > 89 123* 106*   < > = values in this interval not displayed.     Imaging/Diagnostic Tests: No results found.   Carollee Leitz, MD 01/07/2020, 7:13 PM PGY-1, [place] Intern pager: [phone], text pages welcome

## 2020-01-07 NOTE — Progress Notes (Signed)
PHARMACY - TOTAL PARENTERAL NUTRITION CONSULT NOTE   Indication: malnutrition  Patient Measurements: Height: 5\' 4"  (162.6 cm) Weight: 54 kg (119 lb 0.8 oz) IBW/kg (Calculated) : 54.7 TPN AdjBW (KG): 50.7 Body mass index is 20.43 kg/m.  Assessment: 74 yo female initially presented on 3/26 for weakness. Patient with PMH of RA, chronic anemia and GERD. She has had ongoing poor intake since Dec 2020 with LE swelling and unintentional weight loss. Pt previously on TPN from 4/5>4/10 which was stopped since pt had adequate oral intake. However, oral intake has been inconsistent so TPN to be restarted.  4/19 - per FMTS patient will remain on TPN until albumin > 2.5 and is able to have EGD with GI   4/20 - tolerated 14 hr cycle  Glucose / Insulin: CBGs well controlled, SSI dc'd Electrolytes: WNL  Renal: SCr 0.38 LFTs / TGs: Trigs 38>27, LFTs WNL Prealbumin / albumin: Prealbumin 9.2>11.1, Albumin 1.1 Intake / Output; MIVF: UOP 2.6 ml/kg/hr, LBM 4/20 x 2 GI Imaging: 3/31 - barium swallow - unable to pass tablet - study shows esophageal dysmotility, dilation, and narrowing  4/1 CT abd pelvis - anasarca and ascites; likely underlying liver disease  Surgeries / Procedures: see above GI recommends EGD with dilation but pt needs improved nutritional status   Central access: PICC 4/1 TPN start date: 12/22/2019>>4/10; 4/16>>  Nutritional Goals (per RD recommendation on 4/16): kCal: 1750-1950 per day, Protein: 90-105 g/day, Fluid: > 1.7 L  Current Nutrition:  TPN Dysphagia 1 diet - variable, eating 0-100% of meals Prostat TID - 100 cal, 15 g protein, 10 g carbs (each) x 3 yesterday Boost BreezeTID - taking consistently, each supplement provides 250kcal and 9g protein x 3 yesterday  Plan:  Continue current TPN with a cycle over 12 hours - 33 mL/hr x 1 hr, 65 mL/hr x 12 hr, then 33 mL/hr x 1 hr Oral nutritional supplements are providing 1050 kcal/day and 72g protein/day, meeting ~75% of protein  needs TPN Lytes: no changes Monitor TPN labs Mon/Thurs   Barth Kirks, PharmD, BCPS, BCCCP Clinical Pharmacist [phone]  Please check AMION for all [place] numbers  01/07/2020 7:18 AM

## 2020-01-08 DIAGNOSIS — K224 Dyskinesia of esophagus: Secondary | ICD-10-CM | POA: Diagnosis not present

## 2020-01-08 DIAGNOSIS — R531 Weakness: Secondary | ICD-10-CM | POA: Diagnosis not present

## 2020-01-08 DIAGNOSIS — E43 Unspecified severe protein-calorie malnutrition: Secondary | ICD-10-CM | POA: Diagnosis not present

## 2020-01-08 DIAGNOSIS — R1310 Dysphagia, unspecified: Secondary | ICD-10-CM | POA: Diagnosis not present

## 2020-01-08 LAB — COMPREHENSIVE METABOLIC PANEL
ALT: 14 U/L (ref 0–44)
AST: 15 U/L (ref 15–41)
Albumin: 1.4 g/dL — ABNORMAL LOW (ref 3.5–5.0)
Alkaline Phosphatase: 67 U/L (ref 38–126)
Anion gap: 6 (ref 5–15)
BUN: 18 mg/dL (ref 8–23)
CO2: 25 mmol/L (ref 22–32)
Calcium: 7.7 mg/dL — ABNORMAL LOW (ref 8.9–10.3)
Chloride: 106 mmol/L (ref 98–111)
Creatinine, Ser: 0.47 mg/dL (ref 0.44–1.00)
GFR calc Af Amer: >60 mL/min (ref >60)
GFR calc non Af Amer: >60 mL/min (ref >60)
Glucose, Bld: 104 mg/dL — ABNORMAL HIGH (ref 70–99)
Potassium: 4.3 mmol/L (ref 3.5–5.1)
Sodium: 137 mmol/L (ref 135–145)
Total Bilirubin: <0.1 mg/dL — ABNORMAL LOW (ref 0.3–1.2)
Total Protein: 4.9 g/dL — ABNORMAL LOW (ref 6.5–8.1)

## 2020-01-08 LAB — PHOSPHORUS: Phosphorus: 3.8 mg/dL (ref 2.5–4.6)

## 2020-01-08 LAB — CBC
HCT: 22.6 % — ABNORMAL LOW (ref 36.0–46.0)
Hemoglobin: 7 g/dL — ABNORMAL LOW (ref 12.0–15.0)
MCH: 30.7 pg (ref 26.0–34.0)
MCHC: 31 g/dL (ref 30.0–36.0)
MCV: 99.1 fL (ref 80.0–100.0)
Platelets: 339 10*3/uL (ref 150–400)
RBC: 2.28 MIL/uL — ABNORMAL LOW (ref 3.87–5.11)
RDW: 15.4 % (ref 11.5–15.5)
WBC: 6.2 10*3/uL (ref 4.0–10.5)
nRBC: 0 % (ref 0.0–0.2)

## 2020-01-08 LAB — MAGNESIUM: Magnesium: 1.7 mg/dL (ref 1.7–2.4)

## 2020-01-08 MED ORDER — TRAVASOL 10 % IV SOLN
INTRAVENOUS | Status: AC
  Filled 2020-01-08: qty 324

## 2020-01-08 MED ORDER — MAGNESIUM SULFATE 2 GM/50ML IV SOLN
2.0000 g | Freq: Once | INTRAVENOUS | Status: AC
  Administered 2020-01-08: 11:00:00 2 g via INTRAVENOUS
  Filled 2020-01-08: qty 50

## 2020-01-08 NOTE — Progress Notes (Signed)
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  Ongoing  GOAL:   Patient will meet greater than or equal to 90% of their needs  Progressing   MONITOR:   PO intake, Supplement acceptance, Diet advancement, Labs, Weight trends, Skin, I & O's  REASON FOR ASSESSMENT:   Consult Assessment of nutrition requirement/status  ASSESSMENT:   Deanna Cunningham is a 74 y.o. female presenting with weakness. PMH is significant for Rheumatoid Arthritis (no tx, no rheumatologist), chronic anemia and GERD.  4/2- PICC placed 4/5- TPN initiated 4/10- TPN d/c 4/17- TPN restarted due to nausea, vomiting, poor oral intake 4/20- transitioned to cyclic TPN  Reviewed I/O's: -2.9 L x 24 hours and -6.6 L since 12/25/19  UOP: 2.7 L x 24 hours  Stool output: 1 L x 24 hours  Per MD notes, plan to continue to TPN until albumin improves. Once albumin has improved, plan to proceed with EGD with dilation with GI. Pt may also require PEG tube in the future if dilation is not successful.   Spoke with pt over telephone. She reports good tolerance of liquids, such as broths, soups, and applesauce. She expresses frustration that pureed foods are sent, even though she did not order them. Pt shares that she still does not feel ready for solid foods- she was initially thinning out pureed foods with hot water, however,  "they kept getting stuck". She been consuming mostly clear liquids over the past week. She continues to consume [employer] and Prostat supplements, which provide 1050 kcals and 72 grams protein daily- pt amenable to continue these.   RD obtained food preferences from pt and adjusted in [employer] meal ordering system. Additionally, adjusted diet order to room service appropriate so pt could call down and pick items she desired. Since pt likes applesauce and other pureed items, RD will continue to dysphagia 1 diet to provide the largest variety of food selection (applesauce not offered on a full liquid diet). Pt amenable to plan.   Pt remains on cyclic TPN over 12 hours (33 mL/hr x 1 hr, 65 mL/hr x 10 hr, then 33 mL/hr x 1 hr). No meal order completions documented over the past 24 hours. Pt receiving approximately 1909 kcals and 104 grams of protein via TPN, meals, and supplements, meeting 100% of estimated needs.   Plan to d/c to SNF once medically stable.   Labs reviewed.   Diet Order:   Diet Order            DIET - DYS 1 Room service appropriate? Yes with Assist; Fluid consistency: Thin  Diet effective now              EDUCATION NEEDS:   Education needs have been addressed  Skin:  Skin Assessment: Skin Integrity Issues: Skin Integrity Issues:: Stage II Stage II: coccyx Other: MASD to groin, labia, buttocks, and sacrum  Last BM:  01/05/20  Height:   Ht Readings from Last 1 Encounters:  12/27/19 5\' 4"  (1.626 m)    Weight:   Wt Readings from Last 1 Encounters:  01/07/20 54 kg    Ideal Body Weight:  54.5 kg  BMI:  Body mass index is 20.43 kg/m.  Estimated Nutritional Needs:   Kcal:  [id number]  Protein:  90-105 grams  Fluid:  > 1.7 L    [name], RD, LDN, [place] Registered Dietitian II Certified Diabetes Care and Education Specialist Please refer to [facility] for RD and/or RD on-call/weekend/after hours pager

## 2020-01-08 NOTE — Plan of Care (Signed)

## 2020-01-08 NOTE — Plan of Care (Signed)

## 2020-01-08 NOTE — Progress Notes (Addendum)
PHARMACY - TOTAL PARENTERAL NUTRITION CONSULT NOTE   Indication: malnutrition  Patient Measurements: Height: 5\' 4"  (162.6 cm) Weight: 54 kg (119 lb 0.8 oz) IBW/kg (Calculated) : 54.7 TPN AdjBW (KG): 50.7 Body mass index is 20.43 kg/m.  Assessment: 74 yo female initially presented on 3/26 for weakness. Patient with PMH of RA, chronic anemia and GERD. She has had ongoing poor intake since Dec 2020 with LE swelling and unintentional weight loss. Pt previously on TPN from 4/5>4/10 which was stopped since pt had adequate oral intake. However, oral intake has been inconsistent so TPN to be restarted.  4/19 - per FMTS patient will remain on TPN until albumin > 2.5 and is able to have EGD with GI   Glucose / Insulin: CBGs well controlled, SSI dc'd Electrolytes: WNL  Renal: SCr 0.47 LFTs / TGs: Trigs 38>27, LFTs WNL Prealbumin / albumin: Prealbumin 9.2>11.1, Albumin 1.4 (up) Intake / Output; MIVF: UOP 2.1 ml/kg/hr, LBM 4/21 GI Imaging: 3/31 - barium swallow - unable to pass tablet - study shows esophageal dysmotility, dilation, and narrowing  4/1 CT abd pelvis - anasarca and ascites; likely underlying liver disease  Surgeries / Procedures: see above GI recommends EGD with dilation but pt needs improved nutritional status   Central access: PICC 4/1 TPN start date: 12/22/2019>>4/10; 4/16>>  Nutritional Goals (per RD recommendation on 4/21): kCal: 1750-1950 per day, Protein: 90-105 g/day, Fluid: > 1.7 L  Current Nutrition:  TPN Dysphagia 1 diet - variable, eating 0-100% of meals Prostat TID - 100 cal, 15 g protein, 10 g carbs (each) x 3 yesterday Boost BreezeTID - taking consistently, each supplement provides 250kcal and 9g protein x 3 yesterday  Plan:  Continue current TPN with a cycle over 12 hours - 33 mL/hr x 1 hr, 65 mL/hr x 10 hr, then 33 mL/hr x 1 hr - provides 32g protein and 859kcal per day Oral nutritional supplements are providing 1050 kcal/day and 72g protein/day, meeting ~75%  of protein needs Oral supplements and TPN meet 100% of needs, pt is also taking food as able so she is meeting >100% of nutritional needs  TPN Lytes: no changes Monitor TPN labs Mon/Thurs   Salome Arnt, PharmD, BCPS Clinical Pharmacist Please see AMION for all pharmacy numbers 01/08/2020 7:14 AM

## 2020-01-09 DIAGNOSIS — R1310 Dysphagia, unspecified: Secondary | ICD-10-CM | POA: Diagnosis not present

## 2020-01-09 DIAGNOSIS — E8809 Other disorders of plasma-protein metabolism, not elsewhere classified: Secondary | ICD-10-CM | POA: Diagnosis not present

## 2020-01-09 DIAGNOSIS — K224 Dyskinesia of esophagus: Secondary | ICD-10-CM | POA: Diagnosis not present

## 2020-01-09 DIAGNOSIS — E43 Unspecified severe protein-calorie malnutrition: Secondary | ICD-10-CM | POA: Diagnosis not present

## 2020-01-09 MED ORDER — TRAVASOL 10 % IV SOLN
INTRAVENOUS | Status: AC
  Filled 2020-01-09: qty 324

## 2020-01-09 NOTE — Progress Notes (Signed)
Physical Therapy Treatment Patient Details Name: Deanna Cunningham MRN: [id number] DOB: 01/11/1946 Today's Date: 01/09/2020    History of Present Illness Pt is a 74 y.o. female admitted 12/12/19 with progressive weakness, poor PO intake and BLE swelling; worked up for malnutrition (concern for malignancy). Pt also with anemia, electrolyte imbalance, stage 1 sacral wound. Pt with recurrent asymptomatic SVT; may occur in the settings of severe malnutrition and electrolyte imbalance. PMH includes dysphagia, CKD III, anemia, RA.    PT Comments    Pt supine in bed on arrival this session.  She continues to require external assistance to mobilize but she in making functional gains.  Pt continues to benefit from SNF placement to continue strengthening to allow for return home more independently.   Follow Up Recommendations  SNF;Supervision/Assistance - 24 hour     Equipment Recommendations  (defer to post acute)    Recommendations for Other Services       Precautions / Restrictions Precautions Precautions: Fall;Other (comment) Precaution Comments: Bowel incontinence, bilateral hand/finger RA, sacral wound, rectal prolapse (occurred 4/16) Restrictions Weight Bearing Restrictions: No    Mobility  Bed Mobility Overal bed mobility: Needs Assistance Bed Mobility: Supine to Sit;Sit to Supine     Supine to sit: Supervision;HOB elevated Sit to supine: Min assist;HOB elevated   General bed mobility comments: Min assistance to lift RLe back to bed against gravity  Transfers Overall transfer level: Needs assistance Equipment used: Rolling walker (2 wheeled) Transfers: Sit to/from Stand Sit to Stand: Min assist         General transfer comment: Cues for hand placement to and from seated surface.  Pt with poor hand placement during eccentric load.  Ambulation/Gait Ambulation/Gait assistance: Min guard Gait Distance (Feet): 80 Feet Assistive device: Rolling walker (2 wheeled) Gait  Pattern/deviations: Step-through pattern;Decreased stride length;Wide base of support     General Gait Details: No knee buckling noted.  Pt able to tolerate increased distance with decreased assistance.   Stairs             Wheelchair Mobility    Modified Rankin (Stroke Patients Only)       Balance Overall balance assessment: Needs assistance Sitting-balance support: Feet supported Sitting balance-Leahy Scale: Fair       Standing balance-Leahy Scale: Fair                              Cognition Arousal/Alertness: Awake/alert Behavior During Therapy: WFL for tasks assessed/performed Overall Cognitive Status: Within Functional Limits for tasks assessed Area of Impairment: Attention;Following commands;Awareness;Problem solving;Safety/judgement                   Current Attention Level: Selective   Following Commands: Follows one step commands with increased time;Follows multi-step commands inconsistently Safety/Judgement: Decreased awareness of deficits Awareness: Emergent Problem Solving: Requires verbal cues General Comments: WFL for basic mobility and ADL completion; not formally assessed      Exercises General Exercises - Lower Extremity Long Arc Quad: AROM;Both;10 reps;Seated Hip Flexion/Marching: AROM;Both;Seated;10 reps    General Comments        Pertinent Vitals/Pain Pain Assessment: Faces Faces Pain Scale: Hurts little more Pain Location: L foot with AROM Pain Descriptors / Indicators: Discomfort;Sore;Heaviness Pain Intervention(s): Monitored during session;Repositioned    Home Living                      Prior Function  PT Goals (current goals can now be found in the care plan section) Acute Rehab PT Goals Patient Stated Goal: agreeable to SNF, wants to continue to get stronger Potential to Achieve Goals: Fair Progress towards PT goals: Progressing toward goals    Frequency    Min  2X/week      PT Plan Current plan remains appropriate    Co-evaluation              AM-PAC PT "6 Clicks" Mobility   Outcome Measure  Help needed turning from your back to your side while in a flat bed without using bedrails?: A Little Help needed moving from lying on your back to sitting on the side of a flat bed without using bedrails?: A Little Help needed moving to and from a bed to a chair (including a wheelchair)?: A Lot Help needed standing up from a chair using your arms (e.g., wheelchair or bedside chair)?: A Lot Help needed to walk in hospital room?: A Little Help needed climbing 3-5 steps with a railing? : A Lot 6 Click Score: 15    End of Session Equipment Utilized During Treatment: Gait belt Activity Tolerance: Patient tolerated treatment well Patient left: in bed;with call bell/phone within reach;with bed alarm set Nurse Communication: Mobility status PT Visit Diagnosis: Other abnormalities of gait and mobility (R26.89);Muscle weakness (generalized) (M62.81);Difficulty in walking, not elsewhere classified (R26.2) Pain - Right/Left: (bilateral) Pain - part of body: Leg;Ankle and joints of foot     Time: [id number] PT Time Calculation (min) (ACUTE ONLY): 23 min  Charges:  $Gait Training: 8-22 mins $Therapeutic Exercise: 8-22 mins                     Erasmo Leventhal , PTA Acute Rehabilitation Services Pager [phone] Office [phone]     Deanna Cunningham Eli Hose 01/09/2020, 4:51 PM

## 2020-01-09 NOTE — Plan of Care (Signed)
  Problem: Education: Goal: Knowledge of General Education information will improve Description: Including pain rating scale, medication(s)/side effects and non-pharmacologic comfort measures Outcome: Progressing   Problem: Health Behavior/Discharge Planning: Goal: Ability to manage health-related needs will improve Outcome: Progressing   Problem: Nutrition: Goal: Adequate nutrition will be maintained Outcome: Progressing   

## 2020-01-09 NOTE — Progress Notes (Signed)
Family Medicine Teaching Service Daily Progress Note Intern Pager: [phone]  Patient name: Deanna Cunningham Medical record number: [id number] Date of birth: 05-Mar-1946 Age: 74 y.o. Gender: female  Primary Care Provider: Kristie Cowman, MD Consultants: GI, surg  Code Status: Full  Pt Overview and Major Events to Date:  03/26-Admitted 04/01-PICC inserted  Assessment and Plan: Deanna Cunningham a 74 y.o.femalewho presented w/ global weakness, weight loss.  PMHx s/f malnutrition, RA (naive to care), chronic anemia, GERD.  Underweight, severe protein caloriemalnutrition In the setting of known esophageal dysmotility, presbyesophagus-very severe but hopefully improving with TPN and good p.o. intake Stable. Meeting Nutritional need -GI and surgery recommends having albumin~2.5 prior to proceeding with PEG tube -TPN per pharmacy -Daily labs for pharmacy -Replete electrolytes as needed -Magnesium 2 g IV x1 -Daily weight -Strict I's and O's -PT/OT -Dietitian following appreciate recommendations  UTI: Acute, patient stable Last dose Macrobid at [place]. 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 -Avoid QT prolonging medication  Normocytic anemia: Chronic, stable Patient asymptomatic.  Denies any dizziness, shortness of breath or chest pain.  No hematuria or hematochezia -CBC Mondays and Thursdays  GERD: Chronic, stable Stable -Continue Pepcid 20 mg twice daily -Continue Protonix 40 mg daily -Continue soak her feet with meals and bedtime -May have GI cocktail at patient's request  Sacral wound: Stable/improved -Routine wound care -Position changes frequently every 2 hours -Courage out of bed -PT/OT following  Rheumatoid arthritis: Chronic, stable. -Follows with rheumatology outpatient   FEN/GI: -Dysphagia 1 diet, patient said that she is doing well on the liquid - TPN  Prophylaxis: -Lovenox  Disposition: Continue medical  therapy and discussion, disposition likely to be more clear by 4/20 (as to discharge to SNF with TPN vs maintain hospitalization)  Subjective:  No acute events overnight.  Feeling a little better, no concerns voiced.   Objective: Temp:  [98.3 F (36.8 C)-98.5 F (36.9 C)] 98.5 F (36.9 C) (04/23 0421) Pulse Rate:  [102-107] 102 (04/23 0421) Resp:  [17-18] 18 (04/23 0421) BP: (107-169)/(60-84) 107/65 (04/23 0421) SpO2:  [96 %-100 %] 100 % (04/23 0421) Weight:  [49.5 kg] 49.5 kg (04/23 0421) Physical Exam:   General: Alert and oriented, no apparent distress  Cardiovascular: RRR with no murmurs noted Respiratory: CTA bilaterally  Gastrointestinal: Bowel sounds present. No abdominal pain Extremities: lower extremity improving.  Psych: Behavior and speech appropriate to situation  Laboratory: Recent Labs  Lab 01/06/20 1447 01/07/20 0338 01/08/20 0315  WBC 6.3 5.9 6.2  HGB 7.2* 7.0* 7.0*  HCT 23.9* 22.5* 22.6*  PLT 348 328 339   Recent Labs  Lab 01/03/20 0358 01/04/20 0412 01/05/20 0418 01/05/20 0418 01/06/20 1447 01/07/20 0338 01/08/20 0315  NA 136   < > 137   < > 134* 136 137  K 3.1*   < > 4.2   < > 4.0 4.3 4.3  CL 104   < > 106   < > 104 106 106  CO2 25   < > 24   < > 23 25 25   BUN 15   < > 12   < > 16 18 18   CREATININE 0.50   < > 0.44   < > 0.44 0.38* 0.47  CALCIUM 7.3*   < > 7.6*   < > 7.5* 7.6* 7.7*  PROT 4.6*  --  4.7*  --   --   --  4.9*  BILITOT 0.2*  --  0.2*  --   --   --  <  0.1*  ALKPHOS 61  --  60  --   --   --  67  ALT 16  --  15  --   --   --  14  AST 13*  --  16  --   --   --  15  GLUCOSE 82   < > 89   < > 123* 106* 104*   < > = values in this interval not displayed.     Imaging/Diagnostic Tests: No results found.   Carollee Leitz, MD 01/09/2020, 7:46 AM PGY-1, [place] Intern pager: [phone], text pages welcome

## 2020-01-09 NOTE — Progress Notes (Signed)
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  Ongoing  GOAL:   Patient will meet greater than or equal to 90% of their needs  Progressing  MONITOR:   PO intake, Supplement acceptance, Diet advancement, Labs, Weight trends, Skin, I & O's  REASON FOR ASSESSMENT:   Consult Assessment of nutrition requirement/status  ASSESSMENT:   Deanna Cunningham is a 74 y.o. female presenting with weakness. PMH is significant for Rheumatoid Arthritis (no tx, no rheumatologist), chronic anemia and GERD.  4/2- PICC placed 4/5- TPN initiated 4/10- TPN d/c 4/17- TPN restarted due to nausea, vomiting, poor oral intake 4/20- transitioned to cyclic TPN  Reviewed I/O's: -1.1 L x 24 hours and -8.7 L since 12/26/19  UOP: 2.7 L x 24 hours  Per MD notes, plan to continue to TPN until albumin improves. Once albumin has improved, plan to proceed with EGD with dilation with GI. Pt may also require PEG tube in the future if dilation is not successful.   Pt continues to consume mainly liquids (applesauce, cream soups, broth, and soft drinks). She continues to consume [employer] and Prostat supplements, which provide 1050 kcals and 72 grams protein daily.  Pt remains on cyclic TPN over 12 hours (33 mL/hr x 1 hr, 65 mL/hr x 10hr, then 33 mL/hr x 1 hr), which provides 859 kcals and 32 grams protein. Pt  receiving approximately 3002 kcals and 114 grams of protein via TPN, meals, and supplements, meeting >100% of estimated needs.   Labs reviewed.   Diet Order:   Diet Order            DIET - DYS 1 Room service appropriate? Yes; Fluid consistency: Thin  Diet effective now              EDUCATION NEEDS:   Education needs have been addressed  Skin:  Skin Assessment: Skin Integrity Issues: Skin Integrity Issues:: Stage II Stage II: coccyx Other: MASD to groin, labia, buttocks, and sacrum  Last BM:  01/08/20  Height:   Ht Readings from Last 1 Encounters:  12/27/19 5\' 4"  (1.626 m)    Weight:   Wt Readings from Last 1 Encounters:  01/09/20 49.5 kg    Ideal Body Weight:  54.5 kg  BMI:  Body mass index is 18.73 kg/m.  Estimated Nutritional Needs:   Kcal:  [id number]  Protein:  90-105 grams  Fluid:  > 1.7 L    [name], RD, LDN, [place] Registered Dietitian II Certified Diabetes Care and Education Specialist Please refer to [facility] for RD and/or RD on-call/weekend/after hours pager

## 2020-01-09 NOTE — Progress Notes (Signed)
PT Cancellation Note  Patient Details Name: Deanna Cunningham MRN: [id number] DOB: 1946/03/09   Cancelled Treatment:    Reason Eval/Treat Not Completed: (P) Other (comment)(Pt eating right now, will f/u per POC this session.)   [name] 01/09/2020, 3:29 PM Erasmo Leventhal , PTA Acute Rehabilitation Services Pager [phone] Office [phone]

## 2020-01-09 NOTE — Progress Notes (Signed)
PHARMACY - TOTAL PARENTERAL NUTRITION CONSULT NOTE   Indication: malnutrition  Patient Measurements: Height: 5\' 4"  (162.6 cm) Weight: 49.5 kg (109 lb 2 oz) IBW/kg (Calculated) : 54.7 TPN AdjBW (KG): 50.7 Body mass index is 18.73 kg/m.  Assessment: 74 yo female initially presented on 3/26 for weakness. Patient with PMH of RA, chronic anemia and GERD. She has had ongoing poor intake since Dec 2020 with LE swelling and unintentional weight loss. Pt previously on TPN from 4/5>4/10 which was stopped since pt had adequate oral intake. However, oral intake has been inconsistent so TPN to be restarted.  4/19 - per FMTS patient will remain on TPN until albumin > 2.5 and is able to have EGD with GI   Glucose / Insulin: CBGs well controlled, SSI dc'd Electrolytes: WNL  Renal: SCr 0.47 LFTs / TGs: Trigs 38>27, LFTs WNL Prealbumin / albumin: Prealbumin 9.2>11.1, Albumin 1.4 (up) Intake / Output; MIVF: UOP 2.3 ml/kg/hr, LBM 4/22 GI Imaging: 3/31 - barium swallow - unable to pass tablet - study shows esophageal dysmotility, dilation, and narrowing  4/1 CT abd pelvis - anasarca and ascites; likely underlying liver disease  Surgeries / Procedures: see above GI recommends EGD with dilation but pt needs improved nutritional status   Central access: PICC 4/1 TPN start date: 12/22/2019>>4/10; 4/16>>  Nutritional Goals (per RD recommendation on 4/22): kCal: 1750-1950 per day, Protein: 90-105 g/day, Fluid: > 1.7 L  Current Nutrition:  TPN Dysphagia 1 diet - variable, eating 0-100% of meals Prostat TID - 100 cal, 15 g protein, 10 g carbs (each) x 3 yesterday Boost BreezeTID - taking consistently, each supplement provides 250kcal and 9g protein x 3 yesterday  Plan:  Continue current TPN with a cycle over 12 hours - 33 mL/hr x 1 hr, 65 mL/hr x 10 hr, then 33 mL/hr x 1 hr - provides 32g protein and 859kcal per day Oral nutritional supplements are providing 1050 kcal/day and 72g protein/day, meeting ~75%  of protein needs Oral supplements and TPN meet 100% of needs, pt is also taking food as able so she is meeting >100% of nutritional needs  TPN Lytes: no changes Monitor TPN labs Mon/Thurs, BMET in AM  Salome Arnt, PharmD, BCPS Clinical Pharmacist Please see AMION for all pharmacy numbers 01/09/2020 7:10 AM

## 2020-01-10 DIAGNOSIS — E8809 Other disorders of plasma-protein metabolism, not elsewhere classified: Secondary | ICD-10-CM | POA: Diagnosis not present

## 2020-01-10 DIAGNOSIS — R531 Weakness: Secondary | ICD-10-CM | POA: Diagnosis not present

## 2020-01-10 DIAGNOSIS — R634 Abnormal weight loss: Secondary | ICD-10-CM | POA: Diagnosis not present

## 2020-01-10 DIAGNOSIS — E43 Unspecified severe protein-calorie malnutrition: Secondary | ICD-10-CM | POA: Diagnosis not present

## 2020-01-10 LAB — BASIC METABOLIC PANEL
Anion gap: 5 (ref 5–15)
BUN: 19 mg/dL (ref 8–23)
CO2: 26 mmol/L (ref 22–32)
Calcium: 7.9 mg/dL — ABNORMAL LOW (ref 8.9–10.3)
Chloride: 104 mmol/L (ref 98–111)
Creatinine, Ser: 0.31 mg/dL — ABNORMAL LOW (ref 0.44–1.00)
GFR calc Af Amer: >60 mL/min (ref >60)
GFR calc non Af Amer: >60 mL/min (ref >60)
Glucose, Bld: 101 mg/dL — ABNORMAL HIGH (ref 70–99)
Potassium: 4.8 mmol/L (ref 3.5–5.1)
Sodium: 135 mmol/L (ref 135–145)

## 2020-01-10 MED ORDER — TRAVASOL 10 % IV SOLN
INTRAVENOUS | Status: AC
  Filled 2020-01-10: qty 324

## 2020-01-10 NOTE — Progress Notes (Signed)
PHARMACY - TOTAL PARENTERAL NUTRITION CONSULT NOTE   Indication: malnutrition  Patient Measurements: Height: 5\' 4"  (162.6 cm) Weight: 45.7 kg (100 lb 12 oz) IBW/kg (Calculated) : 54.7 TPN AdjBW (KG): 50.7 Body mass index is 17.29 kg/m.  Assessment: 74 yo female initially presented on 3/26 for weakness. Patient with PMH of RA, chronic anemia and GERD. She has had ongoing poor intake since Dec 2020 with LE swelling and unintentional weight loss. Pt previously on TPN from 4/5>4/10 which was stopped since pt had adequate oral intake. However, oral intake has been inconsistent so TPN to be restarted.  4/19 - per FMTS patient will remain on TPN until albumin > 2.5 and is able to have EGD with GI   Glucose / Insulin: CBGs well controlled, SSI dc'd Electrolytes: WNL  Renal: SCr 0.31 LFTs / TGs: Trigs 38>27, LFTs WNL Prealbumin / albumin: Prealbumin 9.2>11.1, Albumin 1.4 (up) Intake / Output; MIVF: UOP 3.3 ml/kg/hr, LBM 4/23 GI Imaging: 3/31 - barium swallow - unable to pass tablet - study shows esophageal dysmotility, dilation, and narrowing  4/1 CT abd pelvis - anasarca and ascites; likely underlying liver disease  Surgeries / Procedures: see above GI recommends EGD with dilation but pt needs improved nutritional status   Central access: PICC 4/1 TPN start date: 12/22/2019>>4/10; 4/16>>  Nutritional Goals (per RD recommendation on 4/23): kCal: 1750-1950 per day, Protein: 90-105 g/day, Fluid: > 1.7 L  Current Nutrition:  TPN Dysphagia 1 diet - variable, eating 0-100% of meals Prostat TID - 100 cal, 15 g protein, 10 g carbs (each) x 3 yesterday Boost BreezeTID - taking consistently, each supplement provides 250kcal and 9g protein x 3 yesterday  Plan:  Continue current TPN with a cycle over 12 hours - 33 mL/hr x 1 hr, 65 mL/hr x 10 hr, then 33 mL/hr x 1 hr - provides 32g protein and 859kcal per day Oral nutritional supplements are providing 1050 kcal/day and 72g protein/day, meeting  ~75% of protein needs Oral supplements and TPN meet 100% of needs, pt is also taking food as able so she is meeting >100% of nutritional needs  TPN Lytes: no changes Monitor TPN labs Mon/Thurs  Thank you for involving pharmacy in this patient's care.  Renold Genta, PharmD, BCPS Clinical Pharmacist Clinical phone for 01/10/2020 until 3p is [phone] 01/10/2020 7:24 AM  **Pharmacist phone directory can be found on [email or web address] listed under [place]**

## 2020-01-10 NOTE — Plan of Care (Signed)

## 2020-01-10 NOTE — Progress Notes (Signed)
Family Medicine Teaching Service Daily Progress Note Intern Pager: [phone]  Patient name: Deanna Cunningham Medical record number: [id number] Date of birth: 1946/06/04 Age: 74 y.o. Gender: female  Primary Care Provider: Kristie Cowman, MD Consultants: GI, gen surg Code Status: full  Pt Overview and Major Events to Date:  03/26-Admitted 04/01-PICC inserted  Assessment and Plan: Deanna Cunningham a 74 y.o.femalewho presented w/ global weakness, weight loss and failure to thrive PMHx s/f malnutrition, RA (naive to care), chronic anemia, GERD.  Underweight, severe protein caloriemalnutrition In the setting of known esophageal dysmotility, presbyesophagus-Stable. Meeting Nutritional need with commendation of TPN and p.o. intake. Continue current nutritional regimen.  -GI and surgery recommends having albumin~2.5 prior to proceeding with PEG tube -TPN per pharmacy -Daily labs for pharmacy -Replete electrolytes as needed -Daily weight -Strict I's and O's -PT/OT -Dietitian following appreciate recommendations  UTI: Acute, patient stable Greater than 100K E. coli.  Pansensitive.  Finished Macrobid. No complaints of dysuria currently.   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 -Avoid QT prolonging medication  Normocytic anemia: Chronic, stable Stable around 7.0.  Patient asymptomatic.  Denies any dizziness, shortness of breath or chest pain.  No hematuria or hematochezia -CBC Mondays and Thursdays  GERD: Chronic, stable Stable -Continue Pepcid 20 mg twice daily -Continue Protonix 40 mg daily -Continue sucralfate with meals and bedtime -May have GI cocktail at patient's request  Sacral wound: Stable/improved -Routine wound care -Position changes frequently every 2 hours -Encourage out of bed -PT/OT following  Rheumatoid arthritis: Chronic, stable. -Follows with rheumatology outpatient  FEN/GI: -Dysphagia 1 diet, patient said that she is doing well  on the liquid - TPN  Prophylaxis: -Lovenox  Disposition: likely SNF.  Pending improvement in nutritional status.   Subjective:  Patient feels fine today.  She does not have any complaints.  She would like to know how long she will need to be on TPN.  I told her it depends on improvement in her nutritional status with the eventual goal of placing a peg tube for enteral nutrition.    Objective: Temp:  [98.2 F (36.8 C)-98.5 F (36.9 C)] 98.4 F (36.9 C) (04/23 1937) Pulse Rate:  [87-108] 108 (04/23 1937) Resp:  [18] 18 (04/23 1937) BP: (107-110)/(49-65) 110/64 (04/23 1937) SpO2:  [100 %] 100 % (04/23 1937) Weight:  [49.5 kg] 49.5 kg (04/23 0421) Physical Exam: General: very cachectic. Alert and oriented.  Cardiovascular: holosystolic murmur heard best in the apex. Regular rhythm, normal rate.  Respiratory: LCTAB. No wheezes.  Abdomen: scaphoid.  No TTP.  Extremities: 1+ pitting edema b/l.    Laboratory: Recent Labs  Lab 01/06/20 1447 01/07/20 0338 01/08/20 0315  WBC 6.3 5.9 6.2  HGB 7.2* 7.0* 7.0*  HCT 23.9* 22.5* 22.6*  PLT 348 328 339   Recent Labs  Lab 01/03/20 0358 01/04/20 0412 01/05/20 0418 01/05/20 0418 01/06/20 1447 01/07/20 0338 01/08/20 0315  NA 136   < > 137   < > 134* 136 137  K 3.1*   < > 4.2   < > 4.0 4.3 4.3  CL 104   < > 106   < > 104 106 106  CO2 25   < > 24   < > 23 25 25   BUN 15   < > 12   < > 16 18 18   CREATININE 0.50   < > 0.44   < > 0.44 0.38* 0.47  CALCIUM 7.3*   < > 7.6*   < > 7.5*  7.6* 7.7*  PROT 4.6*  --  4.7*  --   --   --  4.9*  BILITOT 0.2*  --  0.2*  --   --   --  <0.1*  ALKPHOS 61  --  60  --   --   --  67  ALT 16  --  15  --   --   --  14  AST 13*  --  16  --   --   --  15  GLUCOSE 82   < > 89   < > 123* 106* 104*   < > = values in this interval not displayed.      Imaging/Diagnostic Tests:   Benay Pike, MD 01/10/2020, 2:49 AM PGY-2, [place] Intern pager: [phone], text pages  welcome

## 2020-01-10 NOTE — Progress Notes (Signed)
Family Medicine Teaching Service Daily Progress Note Intern Pager: [phone]  Patient name: Deanna Cunningham Medical record number: [id number] Date of birth: 1946-02-03 Age: 74 y.o. Gender: female  Primary Care Provider: Kristie Cowman, MD Consultants: GI, gen surg Code Status: full  Pt Overview and Major Events to Date:  03/26-Admitted 04/01-PICC inserted  Assessment and Plan: Deanna Cunningham a 74 y.o.femalewho presented w/ global weakness, weight loss and failure to thrive PMHx s/f malnutrition, RA (naive to care), chronic anemia, GERD.  Underweight, severe protein caloriemalnutrition In the setting of known esophageal dysmotility, presbyesophagus: Stable Patient has been meeting nutritional needs with TPN and p.o. intake.  P.o. intake in last 24 hours, 1.07L. -GI and surgery recommends having albumin~2.5 prior to proceeding with PEG tube -TPN per pharmacy -Labs per pharmacy: Every Monday, Thursday: CBC, CMP, mag, phosphorus.  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 -Avoid QT prolonging medication  Normocytic anemia: Chronic, stable Last hemoglobin 7.0 on 4/22.  Patient asymptomatic and vital signs remained stable. Denies any dizziness, shortness of breath or chest pain.  No hematuria or hematochezia -CBC Mondays and Thursdays  GERD: Chronic, stable Stable -Continue Pepcid 20 mg twice daily -Continue Protonix 40 mg daily -Continue sucralfate with meals and bedtime -May have GI cocktail at patient's request  Sacral wound: Stable/improved -Routine wound care -Position changes frequently every 2 hours -Encourage out of bed -PT/OT following  Rheumatoid arthritis: Chronic, stable. -Follows with rheumatology  outpatient  FEN/GI: -Dysphagia 1 diet, patient said that she is doing well on the liquid - TPN  Prophylaxis: -Lovenox  Disposition: Likely SNF.  Patient still relying upon TPN.  Subjective:  Patient denies complaints this AM.  No acute events overnight.  Objective: Temp:  [98 F (36.7 C)-98.2 F (36.8 C)] 98 F (36.7 C) (04/25 0434) Pulse Rate:  [100-108] 106 (04/25 0434) Resp:  [16-17] 17 (04/25 0434) BP: (97-115)/(54-66) 107/65 (04/25 0434) SpO2:  [100 %] 100 % (04/25 0434) Weight:  [45.6 kg] 45.6 kg (04/25 0437)  Physical Exam:  General: 74 y.o. female in NAD, cachectic Cardio: RRR Lungs: CTAB, no wheezing, no rhonchi, no crackles, no IWOB on RA Skin: warm and dry Extremities: 1+ bilateral lower extremity pitting edema  Laboratory: Recent Labs  Lab 01/06/20 1447 01/07/20 0338 01/08/20 0315  WBC 6.3 5.9 6.2  HGB 7.2* 7.0* 7.0*  HCT 23.9* 22.5* 22.6*  PLT 348 328 339   Recent Labs  Lab 01/05/20 0418 01/06/20 1447 01/07/20 0338 01/08/20 0315 01/10/20 0552  NA 137   < > 136 137 135  K 4.2   < > 4.3 4.3 4.8  CL 106   < > 106 106 104  CO2 24   < > 25 25 26   BUN 12   < > 18 18 19   CREATININE 0.44   < > 0.38* 0.47 0.31*  CALCIUM 7.6*   < > 7.6* 7.7* 7.9*  PROT 4.7*  --   --  4.9*  --   BILITOT 0.2*  --   --  <0.1*  --   ALKPHOS 60  --   --  67  --   ALT 15  --   --  14  --   AST 16  --   --  15  --   GLUCOSE 89   < > 106* 104* 101*   < > =  values in this interval not displayed.      Imaging/Diagnostic Tests: No results found.   [place], DO 01/11/2020, 5:53 AM PGY-2, [place] Intern pager: [phone], text pages welcome

## 2020-01-11 DIAGNOSIS — E876 Hypokalemia: Secondary | ICD-10-CM | POA: Diagnosis not present

## 2020-01-11 DIAGNOSIS — K224 Dyskinesia of esophagus: Secondary | ICD-10-CM | POA: Diagnosis not present

## 2020-01-11 DIAGNOSIS — E8809 Other disorders of plasma-protein metabolism, not elsewhere classified: Secondary | ICD-10-CM | POA: Diagnosis not present

## 2020-01-11 DIAGNOSIS — R1310 Dysphagia, unspecified: Secondary | ICD-10-CM | POA: Diagnosis not present

## 2020-01-11 MED ORDER — TRAVASOL 10 % IV SOLN
INTRAVENOUS | Status: AC
  Filled 2020-01-11: qty 324

## 2020-01-11 NOTE — Progress Notes (Signed)
PHARMACY - TOTAL PARENTERAL NUTRITION CONSULT NOTE   Indication: malnutrition  Patient Measurements: Height: 5\' 4"  (162.6 cm) Weight: 45.6 kg (100 lb 8.5 oz) IBW/kg (Calculated) : 54.7 TPN AdjBW (KG): 50.7 Body mass index is 17.26 kg/m.  Assessment: 74 yo female initially presented on 3/26 for weakness. Patient with PMH of RA, chronic anemia and GERD. She has had ongoing poor intake since Dec 2020 with LE swelling and unintentional weight loss. Pt previously on TPN from 4/5>4/10 which was stopped since pt had adequate oral intake. However, oral intake has been inconsistent so TPN to be restarted.  4/19 - per FMTS patient will remain on TPN until albumin > 2.5 and is able to have EGD with GI   Glucose / Insulin: CBGs well controlled, SSI dc'd Electrolytes: WNL  Renal: SCr 0.31 LFTs / TGs: Trigs 38>27, LFTs WNL Prealbumin / albumin: Prealbumin 9.2>11.1, Albumin 1.4 (up) Intake / Output; MIVF: UOP 4.6 ml/kg/hr, LBM 4/24 GI Imaging: 3/31 - barium swallow - unable to pass tablet - study shows esophageal dysmotility, dilation, and narrowing  4/1 CT abd pelvis - anasarca and ascites; likely underlying liver disease  Surgeries / Procedures: see above GI recommends EGD with dilation but pt needs improved nutritional status   Central access: PICC 4/1 TPN start date: 12/22/2019>>4/10; 4/16>>  Nutritional Goals (per RD recommendation on 4/23): kCal: 1750-1950 per day, Protein: 90-105 g/day, Fluid: > 1.7 L  Current Nutrition:  TPN Dysphagia 1 diet - variable, eating 0-100% of meals Prostat TID - 100 cal, 15 g protein, 10 g carbs (each) x 3 yesterday Boost BreezeTID - taking consistently, each supplement provides 250kcal and 9g protein x 3 yesterday  Plan:  Continue current TPN with a cycle over 12 hours - 33 mL/hr x 1 hr, 65 mL/hr x 10 hr, then 33 mL/hr x 1 hr - provides 32g protein and 859kcal per day Oral nutritional supplements are providing 1050 kcal/day and 72g protein/day, meeting  ~75% of protein needs Oral supplements and TPN meet 100% of needs, pt is also taking food as able so she is meeting >100% of nutritional needs  TPN Lytes: no changes Monitor TPN labs Mon/Thurs  Thank you for involving pharmacy in this patient's care.  Renold Genta, PharmD, BCPS Clinical Pharmacist Clinical phone for 01/11/2020 until 3p is [phone] 01/11/2020 7:42 AM  **Pharmacist phone directory can be found on [email or web address] listed under [email or web address]**

## 2020-01-11 NOTE — Plan of Care (Signed)

## 2020-01-11 NOTE — Plan of Care (Signed)
  Problem: Pain Managment: Goal: General experience of comfort will improve Outcome: Progressing   Problem: Safety: Goal: Ability to remain free from injury will improve Outcome: Progressing   Problem: Skin Integrity: Goal: Risk for impaired skin integrity will decrease Outcome: Progressing   

## 2020-01-11 NOTE — TOC Progression Note (Signed)
Transition of Care [facility]) - Progression Note    Patient Details  Name: Deanna Cunningham MRN: [id number] Date of Birth: [birth year]  [facility] CM/SW [place], [place] Phone Number: 01/11/2020, 9:49 AM  Clinical Narrative:     No current bed offers. CSW re-faxed the patient out.   CSW will continue to follow and assist with disposition planning.   Expected Discharge Plan: Skilled Nursing Facility(denied for inpatient rehab.) Barriers to Discharge: Insurance Authorization, SNF Pending bed offer  Expected Discharge Plan and Services Expected Discharge Plan: Skilled Nursing Facility(denied for inpatient rehab.)   Discharge Planning Services: CM Consult   Living arrangements for the past 2 months: Single Family Home                                       Social Determinants of Health (SDOH) Interventions    Readmission Risk Interventions No flowsheet data found.

## 2020-01-12 DIAGNOSIS — R1310 Dysphagia, unspecified: Secondary | ICD-10-CM | POA: Diagnosis not present

## 2020-01-12 DIAGNOSIS — K224 Dyskinesia of esophagus: Secondary | ICD-10-CM | POA: Diagnosis not present

## 2020-01-12 DIAGNOSIS — K222 Esophageal obstruction: Secondary | ICD-10-CM | POA: Diagnosis not present

## 2020-01-12 DIAGNOSIS — E8809 Other disorders of plasma-protein metabolism, not elsewhere classified: Secondary | ICD-10-CM | POA: Diagnosis not present

## 2020-01-12 LAB — CBC
HCT: 23.4 % — ABNORMAL LOW (ref 36.0–46.0)
Hemoglobin: 7.2 g/dL — ABNORMAL LOW (ref 12.0–15.0)
MCH: 30.6 pg (ref 26.0–34.0)
MCHC: 30.8 g/dL (ref 30.0–36.0)
MCV: 99.6 fL (ref 80.0–100.0)
Platelets: 355 10*3/uL (ref 150–400)
RBC: 2.35 MIL/uL — ABNORMAL LOW (ref 3.87–5.11)
RDW: 15.5 % (ref 11.5–15.5)
WBC: 7.1 10*3/uL (ref 4.0–10.5)
nRBC: 0 % (ref 0.0–0.2)

## 2020-01-12 LAB — COMPREHENSIVE METABOLIC PANEL
ALT: 13 U/L (ref 0–44)
AST: 16 U/L (ref 15–41)
Albumin: 1.7 g/dL — ABNORMAL LOW (ref 3.5–5.0)
Alkaline Phosphatase: 66 U/L (ref 38–126)
Anion gap: 7 (ref 5–15)
BUN: 22 mg/dL (ref 8–23)
CO2: 26 mmol/L (ref 22–32)
Calcium: 8.3 mg/dL — ABNORMAL LOW (ref 8.9–10.3)
Chloride: 101 mmol/L (ref 98–111)
Creatinine, Ser: 0.5 mg/dL (ref 0.44–1.00)
GFR calc Af Amer: >60 mL/min (ref >60)
GFR calc non Af Amer: >60 mL/min (ref >60)
Glucose, Bld: 99 mg/dL (ref 70–99)
Potassium: 5.3 mmol/L — ABNORMAL HIGH (ref 3.5–5.1)
Sodium: 134 mmol/L — ABNORMAL LOW (ref 135–145)
Total Bilirubin: 0.2 mg/dL — ABNORMAL LOW (ref 0.3–1.2)
Total Protein: 6.3 g/dL — ABNORMAL LOW (ref 6.5–8.1)

## 2020-01-12 LAB — PHOSPHORUS: Phosphorus: 4.4 mg/dL (ref 2.5–4.6)

## 2020-01-12 LAB — MAGNESIUM: Magnesium: 1.7 mg/dL (ref 1.7–2.4)

## 2020-01-12 LAB — PREALBUMIN: Prealbumin: 17.8 mg/dL — ABNORMAL LOW (ref 18–38)

## 2020-01-12 LAB — TRIGLYCERIDES: Triglycerides: 59 mg/dL (ref <150)

## 2020-01-12 MED ORDER — TRAVASOL 10 % IV SOLN
INTRAVENOUS | Status: AC
  Filled 2020-01-12: qty 324

## 2020-01-12 NOTE — Plan of Care (Signed)

## 2020-01-12 NOTE — Progress Notes (Signed)
Family Medicine Teaching Service Daily Progress Note Intern Pager: [phone]  Patient name: Deanna Cunningham Medical record number: [id number] Date of birth: Mar 16, 1946 Age: 74 y.o. Gender: female  Primary Care Provider: Kristie Cowman, MD Consultants: GI, gen surg Code Status: full  Pt Overview and Major Events to Date:  03/26-Admitted 04/01-PICC inserted   Assessment and Plan: Deanna Cunningham a 74 y.o.femalewho presented w/ global weakness, weight loss and failure to thrive PMHx s/f malnutrition, RA (naive to care), chronic anemia, GERD.  Underweight, severe protein caloriemalnutrition In the setting of known esophageal dysmotility, presbyesophagus: Stable Patient has been meeting nutritional needs with TPN and p.o. intake.  P.o. intake in last 24 hours, 1.74L. Albumin 1.7 -GI and surgery recommends having albumin~2.5 prior to proceeding with PEG tube -TPN per pharmacy -Labs per pharmacy: Every Monday, Thursday: CBC, CMP, mag, phosphorus.  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 -Avoid QT prolonging medication  Normocytic anemia: Chronic, stable Last hemoglobin 7.2 on 4/26.   -CBC Mondays and Thursdays  GERD: Chronic, stable Stable -Continue Pepcid 20 mg twice daily -Continue Protonix 40 mg daily -Continue sucralfate with meals and bedtime -May have GI cocktail at patient's request  Sacral wound: Stable/improved -Routine wound care -Position changes frequently every 2 hours -Encourage out of bed -PT/OT following  Rheumatoid arthritis: Chronic, stable. -Follows with rheumatology outpatient  FEN/GI: -Dysphagia 1 diet, patient said that she is doing well on the liquid - TPN  Prophylaxis: -Lovenox  Disposition:  Likely SNF.  Patient still relying upon TPN.  Subjective:  Doing well and feeling better  Objective: Temp:  [98.3 F (36.8 C)-98.7 F (37.1 C)] 98.3 F (36.8 C) (04/26 0407) Pulse Rate:  [96-110] 102 (04/26 0407) Resp:  [15-16] 16 (04/26 0407) BP: (108-118)/(61-64) 108/63 (04/26 0407) SpO2:  [100 %] 100 % (04/26 0407) Weight:  [43.4 kg] 43.4 kg (04/26 0341)  Physical Exam:   General: Appears well, no acute distress. Age appropriate. Cardiac: RRR, faint murmur heart best at 2nd intercostal space  Respiratory: CTAB, normal effort Abdomen: soft, nontender, nondistended, +BS Extremities: Improved LE edema +1 pitting Neuro: alert and oriented, no focal deficits Psych: normal affect  Laboratory: Recent Labs  Lab 01/07/20 0338 01/08/20 0315 01/12/20 0454  WBC 5.9 6.2 7.1  HGB 7.0* 7.0* 7.2*  HCT 22.5* 22.6* 23.4*  PLT 328 339 355   Recent Labs  Lab 01/08/20 0315 01/10/20 0552 01/12/20 0454  NA 137 135 134*  K 4.3 4.8 5.3*  CL 106 104 101  CO2 25 26 26   BUN 18 19 22   CREATININE 0.47 0.31* 0.50  CALCIUM 7.7* 7.9* 8.3*  PROT 4.9*  --  6.3*  BILITOT <0.1*  --  0.2*  ALKPHOS 67  --  66  ALT 14  --  13  AST 15  --  16  GLUCOSE 104* 101* 99   Imaging/Diagnostic Tests: No results found.   Gerlene Fee, DO 01/12/2020, 7:53 AM PGY-1, [place] Intern pager: [phone], text pages welcome

## 2020-01-12 NOTE — Progress Notes (Signed)
Physical Therapy Treatment Patient Details Name: Deanna Cunningham MRN: [id number] DOB: [birth year] Today's Date: 01/12/2020    History of Present Illness Pt is a 74 y.o. female admitted 12/12/19 with progressive weakness, poor PO intake and BLE swelling; worked up for malnutrition (concern for malignancy). Pt also with anemia, electrolyte imbalance, stage 1 sacral wound. Pt with recurrent asymptomatic SVT; may occur in the settings of severe malnutrition and electrolyte imbalance. PMH includes dysphagia, CKD III, anemia, RA.    PT Comments    Pt supine in bed on arrival this session.  Pt performed LE exercises and progression of gt training.  Continues to require assistance to mobilize.  Will continue to recommend snf placement.      Follow Up Recommendations  SNF;Supervision/Assistance - 24 hour     Equipment Recommendations  (defer to post acute)    Recommendations for Other Services       Precautions / Restrictions Precautions Precautions: Fall;Other (comment) Precaution Comments: Bowel incontinence, bilateral hand/finger RA, sacral wound, rectal prolapse (occurred 4/16) Restrictions Weight Bearing Restrictions: No    Mobility  Bed Mobility Overal bed mobility: Needs Assistance Bed Mobility: Supine to Sit;Sit to Supine Rolling: Modified independent (Device/Increase time)   Supine to sit: Supervision;HOB elevated     General bed mobility comments: Supervision and use of bed rail to move to the edge of the bed.  Transfers Overall transfer level: Needs assistance Equipment used: Rolling walker (2 wheeled) Transfers: Sit to/from Stand Sit to Stand: Min guard         General transfer comment: Cues for hand placement to and from seated surface.  Ambulation/Gait Ambulation/Gait assistance: Min guard Gait Distance (Feet): 100 Feet Assistive device: Rolling walker (2 wheeled) Gait Pattern/deviations: Step-through pattern;Decreased stride length;Wide base of  support Gait velocity: Decreased   General Gait Details: No knee buckling noted.  Pt able to tolerate increased distance with decreased assistance.   Stairs             Wheelchair Mobility    Modified Rankin (Stroke Patients Only)       Balance Overall balance assessment: Needs assistance Sitting-balance support: Feet supported Sitting balance-Leahy Scale: Fair Sitting balance - Comments: Can reach bilateral feet to fix socks and stretch hamstrings/back; limited by abdominal soreness/fullness     Standing balance-Leahy Scale: Fair                              Cognition Arousal/Alertness: Awake/alert Behavior During Therapy: WFL for tasks assessed/performed Overall Cognitive Status: Within Functional Limits for tasks assessed Area of Impairment: Attention;Following commands;Awareness;Problem solving;Safety/judgement                   Current Attention Level: Selective   Following Commands: Follows one step commands with increased time;Follows multi-step commands inconsistently Safety/Judgement: Decreased awareness of deficits Awareness: Emergent Problem Solving: Requires verbal cues General Comments: WFL for basic mobility and ADL completion; not formally assessed      Exercises General Exercises - Lower Extremity Ankle Circles/Pumps: AROM;Both;10 reps;Supine Quad Sets: Strengthening;Both;10 reps;Supine Heel Slides: AROM;Both;10 reps;Supine Hip ABduction/ADduction: AROM;Both;10 reps;Seated Straight Leg Raises: AROM;Both;10 reps;Supine    General Comments        Pertinent Vitals/Pain Pain Assessment: Faces Faces Pain Scale: Hurts little more Pain Location: L foot with AROM Pain Descriptors / Indicators: Discomfort;Sore;Heaviness Pain Intervention(s): Monitored during session;Repositioned;Ice applied    Home Living  Prior Function            PT Goals (current goals can now be found in the care plan  section) Acute Rehab PT Goals Patient Stated Goal: agreeable to SNF, wants to continue to get stronger Potential to Achieve Goals: Fair Progress towards PT goals: Progressing toward goals    Frequency    Min 2X/week      PT Plan Current plan remains appropriate    Co-evaluation              AM-PAC PT "6 Clicks" Mobility   Outcome Measure  Help needed turning from your back to your side while in a flat bed without using bedrails?: A Little Help needed moving from lying on your back to sitting on the side of a flat bed without using bedrails?: A Little Help needed moving to and from a bed to a chair (including a wheelchair)?: A Little Help needed standing up from a chair using your arms (e.g., wheelchair or bedside chair)?: A Little Help needed to walk in hospital room?: A Little Help needed climbing 3-5 steps with a railing? : A Little 6 Click Score: 18    End of Session Equipment Utilized During Treatment: Gait belt Activity Tolerance: Patient tolerated treatment well Patient left: in bed;with call bell/phone within reach;with bed alarm set Nurse Communication: Mobility status PT Visit Diagnosis: Other abnormalities of gait and mobility (R26.89);Muscle weakness (generalized) (M62.81);Difficulty in walking, not elsewhere classified (R26.2) Pain - Right/Left: (bilateral) Pain - part of body: Leg;Ankle and joints of foot     Time: [id number] PT Time Calculation (min) (ACUTE ONLY): 29 min  Charges:  $Gait Training: 8-22 mins $Therapeutic Exercise: 8-22 mins                     Erasmo Leventhal , PTA Acute Rehabilitation Services Pager [phone] Office [phone]     Deanna Cunningham 01/12/2020, 5:21 PM

## 2020-01-12 NOTE — Progress Notes (Signed)
PHARMACY - TOTAL PARENTERAL NUTRITION CONSULT NOTE   Indication: malnutrition  Patient Measurements: Height: 5\' 4"  (162.6 cm) Weight: 43.4 kg (95 lb 10.9 oz) IBW/kg (Calculated) : 54.7 TPN AdjBW (KG): 50.7 Body mass index is 16.42 kg/m.  Assessment: 74 yo female initially presented on 3/26 for weakness. Patient with PMH of RA, chronic anemia and GERD. She has had ongoing poor intake since Dec 2020 with LE swelling and unintentional weight loss. Pt previously on TPN from 4/5>4/10 which was stopped since pt had adequate oral intake. However, oral intake has been inconsistent so TPN to be restarted.  4/19 - per FMTS patient will remain on TPN until albumin > 2.5 and is able to have EGD with GI   Glucose / Insulin: CBGs well controlled, SSI dc'd Electrolytes: Na 134, K 5.3  Renal: SCr 0.31 LFTs / TGs: Trigs 38>27, LFTs WNL Prealbumin / albumin: Prealbumin 9.2>11.1>17.8, Albumin 1.4>1.7 Intake / Output; MIVF: UOP 4.6>5.8 ml/kg/hr, LBM 4/26 x 2 GI Imaging: 3/31 - barium swallow - unable to pass tablet - study shows esophageal dysmotility, dilation, and narrowing  4/1 CT abd pelvis - anasarca and ascites; likely underlying liver disease  Surgeries / Procedures: see above GI recommends EGD with dilation but pt needs improved nutritional status   Central access: PICC 4/1 TPN start date: 12/22/2019>>4/10; 4/16>>  Nutritional Goals (per RD recommendation on 4/23): kCal: 1750-1950 per day, Protein: 90-105 g/day, Fluid: > 1.7 L  Current Nutrition:  TPN Dysphagia 1 diet - variable, eating 0-100% of meals Prostat TID - 100 cal, 15 g protein, 10 g carbs (each) x 3 yesterday Boost BreezeTID - taking consistently, each supplement provides 250kcal and 9g protein x 3 yesterday  Plan:  Continue current TPN with a cycle over 12 hours - 33 mL/hr x 1 hr, 65 mL/hr x 10 hr, then 33 mL/hr x 1 hr - provides 32g protein and 859kcal per day Oral nutritional supplements are providing 1050 kcal/day and 72g  protein/day, meeting ~75% of protein needs Oral supplements and TPN meet 100% of needs, pt is also taking food as able so she is meeting >100% of nutritional needs  TPN Lytes: increase Na - drop K by 2/3 (FMTS not planning on treating the 5.3) Monitor TPN labs Mon/Thurs; BMET in am  Barth Kirks, PharmD, BCPS, BCCCP Clinical Pharmacist [phone]  Please check AMION for all [place] numbers  01/12/2020 8:07 AM

## 2020-01-12 NOTE — TOC Progression Note (Signed)
Transition of Care [facility]) - Progression Note    Patient Details  Name: Deanna Cunningham MRN: [id number] Date of Birth: April 25, 1946  Transition of [facility] CM/SW Contact  Sharin Mons, RN Phone Number: 01/12/2020, 9:28 AM  Clinical Narrative:    Hoping to transition pt to SNF when bed available vs home with home health services. Pt without SNF  bed offers 2/2 TPN.  NCM spoke with pt regarding potentially d/c with home health services as an option, however, pt stated she wants to go to SNF/ rehab ... too weak. Daughter works, limited help @ home. NCM placed call with daughter to discuss TOC needs. Voice message left, awaiting call back.  TOC team will continue to monitor and follow.... Deanna Cunningham (Daughter)     [phone]        Expected Discharge Plan: [place] Barriers to Discharge: No SNF bed  Expected Discharge Plan and Services Expected Discharge Plan: [place]   Discharge Planning Services: CM Consult   Living arrangements for the past 2 months: Single Family Home                                       Social Determinants of Health (SDOH) Interventions    Readmission Risk Interventions No flowsheet data found.

## 2020-01-13 DIAGNOSIS — E8809 Other disorders of plasma-protein metabolism, not elsewhere classified: Secondary | ICD-10-CM | POA: Diagnosis not present

## 2020-01-13 DIAGNOSIS — K224 Dyskinesia of esophagus: Secondary | ICD-10-CM | POA: Diagnosis not present

## 2020-01-13 DIAGNOSIS — R1310 Dysphagia, unspecified: Secondary | ICD-10-CM | POA: Diagnosis not present

## 2020-01-13 DIAGNOSIS — K222 Esophageal obstruction: Secondary | ICD-10-CM | POA: Diagnosis not present

## 2020-01-13 LAB — BASIC METABOLIC PANEL
Anion gap: 4 — ABNORMAL LOW (ref 5–15)
BUN: 22 mg/dL (ref 8–23)
CO2: 28 mmol/L (ref 22–32)
Calcium: 8.3 mg/dL — ABNORMAL LOW (ref 8.9–10.3)
Chloride: 102 mmol/L (ref 98–111)
Creatinine, Ser: 0.49 mg/dL (ref 0.44–1.00)
GFR calc Af Amer: >60 mL/min (ref >60)
GFR calc non Af Amer: >60 mL/min (ref >60)
Glucose, Bld: 89 mg/dL (ref 70–99)
Potassium: 4.7 mmol/L (ref 3.5–5.1)
Sodium: 134 mmol/L — ABNORMAL LOW (ref 135–145)

## 2020-01-13 MED ORDER — TRAVASOL 10 % IV SOLN
INTRAVENOUS | Status: AC
  Filled 2020-01-13: qty 324

## 2020-01-13 MED ORDER — TRAVASOL 10 % IV SOLN
INTRAVENOUS | Status: DC
  Filled 2020-01-13: qty 324

## 2020-01-13 NOTE — Progress Notes (Signed)
VAST consult to obtain labs for TPN. Returned phone call to number listed; pharmacy answered. Educated that the pt's nurse has to notify IVT when labs are due, but this VAST RN is on way to collect labs at this time.

## 2020-01-13 NOTE — Plan of Care (Signed)
  Problem: Pain Managment: Goal: General experience of comfort will improve Outcome: Progressing   Problem: Safety: Goal: Ability to remain free from injury will improve Outcome: Progressing   Problem: Skin Integrity: Goal: Risk for impaired skin integrity will decrease Outcome: Progressing   

## 2020-01-13 NOTE — Progress Notes (Signed)
PHARMACY - TOTAL PARENTERAL NUTRITION CONSULT NOTE   Indication: malnutrition  Patient Measurements: Height: 5\' 4"  (162.6 cm) Weight: 38.7 kg (85 lb 5.1 oz) IBW/kg (Calculated) : 54.7 TPN AdjBW (KG): 50.7 Body mass index is 14.64 kg/m.  Assessment: 74 yo female initially presented on 3/26 for weakness. Patient with PMH of RA, chronic anemia and GERD. She has had ongoing poor intake since Dec 2020 with LE swelling and unintentional weight loss. Pt previously on TPN from 4/5>4/10 which was stopped since pt had adequate oral intake. However, oral intake has been inconsistent so TPN to be restarted.  4/19 - per FMTS patient will remain on TPN until albumin > 2.5 and is able to have EGD with GI   Glucose / Insulin: CBGs well controlled, SSI dc'd Electrolytes: Na 134, K 4.7 Renal: SCr 0.31 LFTs / TGs: Trigs 38>27, LFTs WNL Prealbumin / albumin: Prealbumin 9.2>11.1>17.8, Albumin 1.4>1.7 Intake / Output; MIVF: UOP 4.9 ml/kg/hr, LBM 4/26 x 2 GI Imaging: 3/31 - barium swallow - unable to pass tablet - study shows esophageal dysmotility, dilation, and narrowing  4/1 CT abd pelvis - anasarca and ascites; likely underlying liver disease  Surgeries / Procedures: see above GI recommends EGD with dilation but pt needs improved nutritional status   Central access: PICC 4/1 TPN start date: 12/22/2019>>4/10; 4/16>>  Nutritional Goals (per RD recommendation on 4/23): kCal: 1750-1950 per day, Protein: 90-105 g/day, Fluid: > 1.7 L  Current Nutrition:  TPN Dysphagia 1 diet - variable, eating 0-100% of meals Prostat TID - 100 cal, 15 g protein, 10 g carbs (each) x 3 yesterday Boost BreezeTID - taking consistently, each supplement provides 250kcal and 9g protein x 3 yesterday  Plan:  Continue current TPN with a cycle over 12 hours - 33 mL/hr x 1 hr, 65 mL/hr x 10 hr, then 33 mL/hr x 1 hr - provides 32g protein and 859kcal per day Oral nutritional supplements are providing 1050 kcal/day and 72g  protein/day, meeting ~75% of protein needs Oral supplements and TPN meet 100% of needs, pt is also taking food as able so she is meeting >100% of nutritional needs  TPN Lytes: increase Na - continue lowered K Monitor TPN labs Mon/Thurs; BMET in am  Barth Kirks, PharmD, BCPS, BCCCP Clinical Pharmacist [phone]  Please check AMION for all [place] numbers  01/13/2020 9:52 AM

## 2020-01-13 NOTE — Progress Notes (Signed)
Family Medicine Teaching Service Daily Progress Note Intern Pager: [phone]  Patient name: Deanna Cunningham Medical record number: [id number] Date of birth: 10/30/1945 Age: 74 y.o. Gender: female  Primary Care Provider: Kristie Cowman, MD Consultants: GI, gen surg Code Status: full  Pt Overview and Major Events to Date:  03/26-Admitted 04/01-PICC inserted   Assessment and Plan: Deanna Cunningham a 74 y.o.femalewho presented w/ global weakness, weight loss and failure to thrive PMHx s/f malnutrition, RA (naive to care), chronic anemia, GERD.  Underweight, severe protein caloriemalnutrition In the setting of known esophageal dysmotility, presbyesophagus: Stable Patient has been meeting nutritional needs with TPN and p.o. intake.  P.o. intake in last 24 hours, 0.48L. Albumin 1.7. Wt. 38.7 kg from admission wt of 42.2kg. Improving LE edema.  -GI and surgery recommends having albumin~2.5 prior to proceeding with PEG tube -TPN per pharmacy -Labs per pharmacy: Every Monday, Thursday: CBC, CMP, mag, phosphorus.  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 -Avoid QT prolonging medication  Normocytic anemia: Chronic, stable Last hemoglobin 7.2 on 4/26.   -CBC Mondays and Thursdays  GERD: Chronic, stable Stable -Continue Pepcid 20 mg twice daily -Continue Protonix 40 mg daily -Continue sucralfate with meals and bedtime -May have GI cocktail at patient's request  Sacral wound: Stable/improved -Routine wound care -Position changes frequently every 2 hours -Encourage out of bed -PT/OT following  Rheumatoid arthritis: Chronic, stable. -Follows with rheumatology outpatient  FEN/GI: -Dysphagia 1 diet, patient said that she is doing well on  the liquid - TPN  Prophylaxis: -Lovenox  Disposition: Likely SNF.  Patient still relying upon TPN.  Subjective:  No concerns at this time.   Objective: Temp:  [97.8 F (36.6 C)-98.3 F (36.8 C)] 98.3 F (36.8 C) (04/27 0739) Pulse Rate:  [96-107] 105 (04/27 0739) Resp:  [14-18] 17 (04/27 0739) BP: (104-113)/(51-61) 113/61 (04/27 0739) SpO2:  [98 %-100 %] 100 % (04/27 0739) Weight:  [38.7 kg] 38.7 kg (04/27 0500)  Physical Exam:   General: Appears well, no acute distress. Age appropriate. Cardiac:S3 murmur at LRSB, RRR Respiratory: CTAB, normal effort Extremities: Mild pitting LE edema Neuro: alert and oriented x4  Laboratory: Recent Labs  Lab 01/07/20 0338 01/08/20 0315 01/12/20 0454  WBC 5.9 6.2 7.1  HGB 7.0* 7.0* 7.2*  HCT 22.5* 22.6* 23.4*  PLT 328 339 355   Recent Labs  Lab 01/08/20 0315 01/10/20 0552 01/12/20 0454  NA 137 135 134*  K 4.3 4.8 5.3*  CL 106 104 101  CO2 25 26 26   BUN 18 19 22   CREATININE 0.47 0.31* 0.50  CALCIUM 7.7* 7.9* 8.3*  PROT 4.9*  --  6.3*  BILITOT <0.1*  --  0.2*  ALKPHOS 67  --  66  ALT 14  --  13  AST 15  --  16  GLUCOSE 104* 101* 99   Imaging/Diagnostic Tests: No results found.   Gerlene Fee, DO 01/13/2020, 8:33 AM PGY-1, [place] Intern pager: [phone], text pages welcome

## 2020-01-13 NOTE — Plan of Care (Signed)

## 2020-01-14 DIAGNOSIS — E8809 Other disorders of plasma-protein metabolism, not elsewhere classified: Secondary | ICD-10-CM | POA: Diagnosis not present

## 2020-01-14 DIAGNOSIS — K224 Dyskinesia of esophagus: Secondary | ICD-10-CM | POA: Diagnosis not present

## 2020-01-14 DIAGNOSIS — K222 Esophageal obstruction: Secondary | ICD-10-CM | POA: Diagnosis not present

## 2020-01-14 DIAGNOSIS — R1310 Dysphagia, unspecified: Secondary | ICD-10-CM | POA: Diagnosis not present

## 2020-01-14 LAB — BASIC METABOLIC PANEL
Anion gap: 7 (ref 5–15)
BUN: 21 mg/dL (ref 8–23)
CO2: 26 mmol/L (ref 22–32)
Calcium: 8.4 mg/dL — ABNORMAL LOW (ref 8.9–10.3)
Chloride: 100 mmol/L (ref 98–111)
Creatinine, Ser: 0.54 mg/dL (ref 0.44–1.00)
GFR calc Af Amer: >60 mL/min (ref >60)
GFR calc non Af Amer: >60 mL/min (ref >60)
Glucose, Bld: 115 mg/dL — ABNORMAL HIGH (ref 70–99)
Potassium: 4.1 mmol/L (ref 3.5–5.1)
Sodium: 133 mmol/L — ABNORMAL LOW (ref 135–145)

## 2020-01-14 MED ORDER — TRAVASOL 10 % IV SOLN
INTRAVENOUS | Status: DC
  Filled 2020-01-14: qty 324

## 2020-01-14 MED ORDER — TRAVASOL 10 % IV SOLN
INTRAVENOUS | Status: AC
  Filled 2020-01-14: qty 324

## 2020-01-14 NOTE — Progress Notes (Signed)
Family Medicine Teaching Service Daily Progress Note Intern Pager: [phone]  Patient name: Deanna Cunningham Medical record number: [id number] Date of birth: [birth year] Age: 74 y.o. Gender: female  Primary Care Provider: Kristie Cowman, MD Consultants: GI, gen surg Code Status: full  Pt Overview and Major Events to Date:  03/26-Admitted 04/01-PICC inserted   Assessment and Plan: Deanna Cunningham a 74 y.o.femalewho presented w/ global weakness, weight loss and failure to thrive. PMHx s/f malnutrition, RA (naive to care), chronic anemia, GERD.  Underweight, severe protein caloriemalnutrition In the setting of known esophageal dysmotility, presbyesophagus: Stable Patient has been meeting nutritional needs with TPN and p.o. intake.  P.o. intake in last 24 hours, 0.9L. Albumin 1.7. Wt. 38.9 kg from admission wt of 42.2kg. Improving LE edema.  -GI and surgery recommends having albumin ~2-2.5 prior to proceeding with EGD. -TPN per pharmacy -Labs per pharmacy: Every Monday, Thursday: CBC, CMP, mag, phosphorus.  Every Monday prealbumin, triglycerides -Replete electrolytes as needed -Daily weight -Strict I's and O's -PT/OT -Dietitian following appreciate recommendations  Left toe discomfort 1 day of left podagra pain. No signs of infection. Will closely monitor during admssion.   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 -Avoid QT prolonging medication  Normocytic anemia: Chronic, stable Last hemoglobin 7.2 on 4/26.   -CBC Mondays and Thursdays  GERD: Chronic, stable Stable -Continue Pepcid 20 mg twice daily -Continue Protonix 40 mg daily -Continue sucralfate with meals and bedtime -May have GI cocktail at patient's request  Sacral wound: Stable/improved -Routine wound care -Position changes frequently every 2 hours -Encourage out of bed -PT/OT following  Rheumatoid arthritis: Chronic,  stable. -Follows with rheumatology outpatient  FEN/GI: -Dysphagia 1 diet, patient said that she is doing well on the liquid - TPN  Prophylaxis: -Lovenox  Disposition: Likely SNF.  Patient still relying upon TPN.  Subjective:  Left toe discomfort.    Objective: Temp:  [98.4 F (36.9 C)-98.7 F (37.1 C)] 98.7 F (37.1 C) (04/28 0228) Pulse Rate:  [95-98] 98 (04/28 0228) Resp:  [17-18] 17 (04/28 0228) BP: (98-110)/(55-56) 105/56 (04/28 0228) SpO2:  [98 %-100 %] 98 % (04/28 0228) Weight:  [38.9 kg] 38.9 kg (04/28 0500)  Physical Exam:  General: Appears well, no acute distress. Age appropriate. Cardiac: RRR, 2/6 murmur at RSB Respiratory: CTAB, normal effort Abdomen: soft, nontender, non distended, +bs Extremities: Mild LE edema. Unremarkable left podagra. Neuro: alert and oriented, no focal deficits Psych: normal affect  Laboratory: Recent Labs  Lab 01/08/20 0315 01/12/20 0454  WBC 6.2 7.1  HGB 7.0* 7.2*  HCT 22.6* 23.4*  PLT 339 355   Recent Labs  Lab 01/08/20 0315 01/10/20 0552 01/12/20 0454 01/13/20 0907 01/14/20 0200  NA 137   < > 134* 134* 133*  K 4.3   < > 5.3* 4.7 4.1  CL 106   < > 101 102 100  CO2 25   < > 26 28 26   BUN 18   < > 22 22 21   CREATININE 0.47   < > 0.50 0.49 0.54  CALCIUM 7.7*   < > 8.3* 8.3* 8.4*  PROT 4.9*  --  6.3*  --   --   BILITOT <0.1*  --  0.2*  --   --   ALKPHOS 67  --  66  --   --   ALT 14  --  13  --   --   AST 15  --  16  --   --  GLUCOSE 104*   < > 99 89 115*   < > = values in this interval not displayed.   Imaging/Diagnostic Tests: No results found.   Gerlene Fee, DO 01/14/2020, 8:13 AM PGY-1, [place] Intern pager: [phone], text pages welcome

## 2020-01-14 NOTE — Progress Notes (Signed)
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  Ongoing  GOAL:   Patient will meet greater than or equal to 90% of their needs  Met with TPN, meals, and supplements  MONITOR:   PO intake, Supplement acceptance, Diet advancement, Labs, Weight trends, Skin, I & O's  REASON FOR ASSESSMENT:   Consult Assessment of nutrition requirement/status  ASSESSMENT:   Deanna Cunningham is a 74 y.o. female presenting with weakness. PMH is significant for Rheumatoid Arthritis (no tx, no rheumatologist), chronic anemia and GERD.  4/2- PICC placed 4/5- TPN initiated 4/10- TPN d/c 4/17- TPN restarted due to nausea, vomiting, poor oral intake 4/20- transitioned to cyclic TPN  Reviewed I/O's: -3.1 L x 24 hours and -28.3 L since 12/31/19  UOP: 4.7 L x 24 hours  Per MD notes, plan to continue to TPN until albumin improves. Once albumin has improved, plan to proceed with EGD with dilation with GI. Pt may also require PEG tube in the future if dilation is not successful.   Pt continues to consume mainly liquids (applesauce, cream soups, broth, and soft drinks). She continues to consume [employer] and Prostat supplements, which provide 1050 kcals and 72 grams protein daily. Reviewed meal completion records over the past 72 hours- pt consuming 1146 kcals and 14 grams of protein on average via meal trays  daily.  Pt remains on cyclic TPN over 12 hours (33 mL/hr x 1 hr, 65 mL/hr x 10hr, then 33 mL/hr x 1 hr), which provides 859 kcals and 32 grams protein. Pt receiving approximately 3055 kcals and 1118 grams of protein via TPN, meals, and supplements, meeting >100% of estimated needs.   Plan for SNF placement at discharge.   Labs reviewed: Na: 133.   Diet Order:   Diet Order            DIET - DYS 1 Room service appropriate? Yes; Fluid consistency: Thin  Diet effective now              EDUCATION NEEDS:   Education needs have been addressed  Skin:  Skin Assessment: Skin Integrity Issues: Skin Integrity Issues:: Stage II Stage II: coccyx Other: MASD to groin, labia, buttocks, and sacrum  Last BM:  01/13/20  Height:   Ht Readings from Last 1 Encounters:  12/27/19 5' 4"  (1.626 m)    Weight:   Wt Readings from Last 1 Encounters:  01/14/20 38.9 kg    Ideal Body Weight:  54.5 kg  BMI:  Body mass index is 14.72 kg/m.  Estimated Nutritional Needs:   Kcal:  4503-8882  Protein:  90-105 grams  Fluid:  > 1.7 L    [name], RD, LDN, [place] Registered Dietitian II Certified Diabetes Care and Education Specialist Please refer to [facility] for RD and/or RD on-call/weekend/after hours pager

## 2020-01-14 NOTE — Progress Notes (Addendum)
PHARMACY - TOTAL PARENTERAL NUTRITION CONSULT NOTE   Indication: malnutrition  Patient Measurements: Height: 5\' 4"  (162.6 cm) Weight: 38.9 kg (85 lb 12.1 oz) IBW/kg (Calculated) : 54.7 TPN AdjBW (KG): 50.7 Body mass index is 14.72 kg/m.  Assessment: 74 yo female initially presented on 3/26 for weakness. Patient with PMH of RA, chronic anemia and GERD. She has had ongoing poor intake since Dec 2020 with LE swelling and unintentional weight loss. Pt previously on TPN from 4/5>4/10 which was stopped since pt had adequate oral intake. However, oral intake has been inconsistent so TPN to be restarted.  4/19 - per FMTS patient will remain on TPN until albumin > 2.5 and is able to have EGD with GI   Glucose / Insulin: CBGs well controlled, SSI dc'd Electrolytes: Na 133, K 4.1 Renal: SCr 0.54 LFTs / TGs: Trigs 38>27, LFTs WNL Prealbumin / albumin: Prealbumin 9.2>11.1>17.8, Albumin 1.4>1.7 Intake / Output; MIVF: UOP 5 ml/kg/hr, LBM 4/27 x 1 GI Imaging: 3/31 - barium swallow - unable to pass tablet - study shows esophageal dysmotility, dilation, and narrowing  4/1 CT abd pelvis - anasarca and ascites; likely underlying liver disease  Surgeries / Procedures: see above GI recommends EGD with dilation but pt needs improved nutritional status   Central access: PICC 4/1 TPN start date: 12/22/2019>>4/10; 4/16>>  Nutritional Goals (per RD recommendation on 4/23): kCal: 1750-1950 per day, Protein: 90-105 g/day, Fluid: > 1.7 L  Current Nutrition:  TPN Dysphagia 1 diet - variable, eating 0-100% of meals Prostat TID - 100 cal, 15 g protein, 10 g carbs (each) x 3 yesterday Boost BreezeTID - taking consistently, each supplement provides 250kcal and 9g protein x 3 yesterday  Plan:  Continue current TPN with a cycle over 12 hours - 33 mL/hr x 1 hr, 65 mL/hr x 10 hr, then 33 mL/hr x 1 hr - provides 32g protein and 859kcal per day Oral nutritional supplements are providing 1050 kcal/day and 72g  protein/day, meeting ~75% of protein needs Oral supplements and TPN meet 100% of needs, pt is also taking food as able so she is meeting >100% of nutritional needs  TPN Lytes: increase Na - continue lowered K Monitor TPN labs Mon/Thurs; BMET in am  Barth Kirks, PharmD, BCPS, BCCCP Clinical Pharmacist [phone]  Please check AMION for all [place] numbers  01/14/2020 7:41 AM

## 2020-01-14 NOTE — Plan of Care (Signed)

## 2020-01-14 NOTE — Progress Notes (Signed)
Occupational Therapy Treatment Patient Details Name: Deanna Cunningham MRN: [id number] DOB: 19-Oct-1945 Today's Date: 01/14/2020    History of present illness Pt is a 74 y.o. female admitted 12/12/19 with progressive weakness, poor PO intake and BLE swelling; worked up for malnutrition (concern for malignancy). Pt also with anemia, electrolyte imbalance, stage 1 sacral wound. Pt with recurrent asymptomatic SVT; may occur in the settings of severe malnutrition and electrolyte imbalance. PMH includes dysphagia, CKD III, anemia, RA.   OT comments  Pt continues to progress toward prior level of functioning, goals updated this session. Pt requires supervision for bed mobility and minguard for toileting and grooming at sink level. Pt donned socks with AE. Pt will continue to benefit from skilled OT services to maximize safety and independence with ADL/IADL and functional mobility. Will continue to follow acutely and progress as tolerated.    Follow Up Recommendations  SNF    Equipment Recommendations  3 in 1 bedside commode;Tub/shower bench    Recommendations for Other Services      Precautions / Restrictions Precautions Precautions: Fall;Other (comment) Precaution Comments: Bowel incontinence, bilateral hand/finger RA, sacral wound, rectal prolapse (occurred 4/16) Restrictions Weight Bearing Restrictions: No       Mobility Bed Mobility Overal bed mobility: Needs Assistance Bed Mobility: Supine to Sit;Sit to Supine     Supine to sit: Supervision;HOB elevated Sit to supine: HOB elevated;Supervision   General bed mobility comments: Supervision and use of bed rail to move to the edge of the bed.  Transfers Overall transfer level: Needs assistance Equipment used: Rolling walker (2 wheeled) Transfers: Sit to/from Stand Sit to Stand: Min guard         General transfer comment: demonstrated good carry over of safe hand placement    Balance Overall balance assessment: Needs  assistance Sitting-balance support: Feet supported Sitting balance-Leahy Scale: Fair Sitting balance - Comments: Can reach bilateral feet to fix socks    Standing balance support: Single extremity supported;During functional activity Standing balance-Leahy Scale: Fair Standing balance comment: reliant on single UE support in standing                           ADL either performed or assessed with clinical judgement   ADL Overall ADL's : Needs assistance/impaired     Grooming: Min guard;Wash/dry face;Oral care Grooming Details (indicate cue type and reason): at sink level             Lower Body Dressing: Set up;Sit to/from stand;Min guard Lower Body Dressing Details (indicate cue type and reason): donned socks using sock aide Toilet Transfer: Min guard;Ambulation Toilet Transfer Details (indicate cue type and reason): into bathroom, pt had BM and urinated Toileting- Clothing Manipulation and Hygiene: Moderate assistance;Sit to/from stand Toileting - Clothing Manipulation Details (indicate cue type and reason): assist for posterior care     Functional mobility during ADLs: Min guard;Rolling walker General ADL Comments: tolerated      Vision       Perception     Praxis      Cognition Arousal/Alertness: Awake/alert Behavior During Therapy: WFL for tasks assessed/performed Overall Cognitive Status: Within Functional Limits for tasks assessed                                 General Comments: utilized AE to assist with LB dressing without cues from therapist;demonstrate safe hand placement during transfers  Exercises Other Exercises Other Exercises: continued to review UE HEP, pt reports completing almost every day   Shoulder Instructions       General Comments      Pertinent Vitals/ Pain       Pain Assessment: Faces Faces Pain Scale: Hurts a little bit Pain Location: generalized Pain Descriptors / Indicators: Guarding Pain  Intervention(s): Monitored during session;Limited activity within patient's tolerance  Home Living                                          Prior Functioning/Environment              Frequency  Min 2X/week        Progress Toward Goals  OT Goals(current goals can now be found in the care plan section)  Progress towards OT goals: Progressing toward goals  Acute Rehab OT Goals Patient Stated Goal: agreeable to SNF, wants to continue to get stronger OT Goal Formulation: With patient Time For Goal Achievement: 01/28/20 Potential to Achieve Goals: Good ADL Goals Pt Will Perform Grooming: with modified independence;standing Pt Will Perform Upper Body Bathing: with modified independence;standing;sitting Pt Will Perform Lower Body Bathing: with modified independence;sit to/from stand Pt Will Perform Upper Body Dressing: with modified independence;sitting Pt Will Perform Lower Body Dressing: with modified independence;sit to/from stand Pt Will Transfer to Toilet: with modified independence;ambulating;regular height toilet Pt Will Perform Toileting - Clothing Manipulation and hygiene: with modified independence;sit to/from stand Pt/caregiver will Perform Home Exercise Program: Increased strength;Right Upper extremity;Left upper extremity;With Supervision;With written HEP provided  Plan Discharge plan remains appropriate    Co-evaluation                 AM-PAC OT "6 Clicks" Daily Activity     Outcome Measure   Help from another person eating meals?: A Little Help from another person taking care of personal grooming?: A Little Help from another person toileting, which includes using toliet, bedpan, or urinal?: A Little Help from another person bathing (including washing, rinsing, drying)?: A Little Help from another person to put on and taking off regular upper body clothing?: A Little Help from another person to put on and taking off regular lower body  clothing?: A Little 6 Click Score: 18    End of Session Equipment Utilized During Treatment: Rolling walker;Gait belt  OT Visit Diagnosis: Unsteadiness on feet (R26.81);Repeated falls (R29.6);Muscle weakness (generalized) (M62.81)   Activity Tolerance Patient tolerated treatment well   Patient Left in bed;with bed alarm set;with call bell/phone within reach   Nurse Communication Mobility status        Time: [id number] OT Time Calculation (min): 33 min  Charges: OT General Charges $OT Visit: 1 Visit OT Treatments $Self Care/Home Management : 23-37 mins  Helene Kelp OTR/L Acute Rehabilitation Services Office: [place] 01/14/2020, 12:43 PM

## 2020-01-15 DIAGNOSIS — E43 Unspecified severe protein-calorie malnutrition: Secondary | ICD-10-CM | POA: Diagnosis not present

## 2020-01-15 DIAGNOSIS — R1310 Dysphagia, unspecified: Secondary | ICD-10-CM | POA: Diagnosis not present

## 2020-01-15 DIAGNOSIS — E8809 Other disorders of plasma-protein metabolism, not elsewhere classified: Secondary | ICD-10-CM | POA: Diagnosis not present

## 2020-01-15 DIAGNOSIS — K222 Esophageal obstruction: Secondary | ICD-10-CM | POA: Diagnosis not present

## 2020-01-15 LAB — CBC
HCT: 23.1 % — ABNORMAL LOW (ref 36.0–46.0)
Hemoglobin: 7 g/dL — ABNORMAL LOW (ref 12.0–15.0)
MCH: 29.7 pg (ref 26.0–34.0)
MCHC: 30.3 g/dL (ref 30.0–36.0)
MCV: 97.9 fL (ref 80.0–100.0)
Platelets: 317 10*3/uL (ref 150–400)
RBC: 2.36 MIL/uL — ABNORMAL LOW (ref 3.87–5.11)
RDW: 15.2 % (ref 11.5–15.5)
WBC: 5.2 10*3/uL (ref 4.0–10.5)
nRBC: 0 % (ref 0.0–0.2)

## 2020-01-15 LAB — COMPREHENSIVE METABOLIC PANEL
ALT: 13 U/L (ref 0–44)
AST: 15 U/L (ref 15–41)
Albumin: 1.9 g/dL — ABNORMAL LOW (ref 3.5–5.0)
Alkaline Phosphatase: 61 U/L (ref 38–126)
Anion gap: 4 — ABNORMAL LOW (ref 5–15)
BUN: 23 mg/dL (ref 8–23)
CO2: 28 mmol/L (ref 22–32)
Calcium: 8.4 mg/dL — ABNORMAL LOW (ref 8.9–10.3)
Chloride: 104 mmol/L (ref 98–111)
Creatinine, Ser: 0.47 mg/dL (ref 0.44–1.00)
GFR calc Af Amer: >60 mL/min (ref >60)
GFR calc non Af Amer: >60 mL/min (ref >60)
Glucose, Bld: 101 mg/dL — ABNORMAL HIGH (ref 70–99)
Potassium: 4.1 mmol/L (ref 3.5–5.1)
Sodium: 136 mmol/L (ref 135–145)
Total Bilirubin: 0.6 mg/dL (ref 0.3–1.2)
Total Protein: 6.2 g/dL — ABNORMAL LOW (ref 6.5–8.1)

## 2020-01-15 LAB — MAGNESIUM: Magnesium: 1.8 mg/dL (ref 1.7–2.4)

## 2020-01-15 LAB — PHOSPHORUS: Phosphorus: 4.6 mg/dL (ref 2.5–4.6)

## 2020-01-15 MED ORDER — TRAVASOL 10 % IV SOLN
INTRAVENOUS | Status: AC
  Filled 2020-01-15: qty 324

## 2020-01-15 MED ORDER — K PHOS MONO-SOD PHOS DI & MONO 155-852-130 MG PO TABS
250.0000 mg | ORAL_TABLET | Freq: Every day | ORAL | Status: DC
  Administered 2020-01-16 – 2020-01-22 (×7): 250 mg via ORAL
  Filled 2020-01-15 (×7): qty 1

## 2020-01-15 MED ORDER — THIAMINE HCL 100 MG PO TABS
100.0000 mg | ORAL_TABLET | Freq: Every day | ORAL | Status: DC
  Administered 2020-01-16 – 2020-02-10 (×24): 100 mg via ORAL
  Filled 2020-01-15 (×24): qty 1

## 2020-01-15 NOTE — Plan of Care (Signed)

## 2020-01-15 NOTE — Progress Notes (Addendum)
PHARMACY - TOTAL PARENTERAL NUTRITION CONSULT NOTE   Indication: malnutrition  Patient Measurements: Height: 5\' 4"  (162.6 cm) Weight: 40.7 kg (89 lb 11.6 oz) IBW/kg (Calculated) : 54.7 TPN AdjBW (KG): 50.7 Body mass index is 15.4 kg/m.  Assessment: 74 yo female initially presented on 3/26 for weakness. Patient with PMH of RA, chronic anemia and GERD. She has had ongoing poor intake since Dec 2020 with LE swelling and unintentional weight loss. Pt previously on TPN from 4/5>4/10 which was stopped since pt had adequate oral intake. However, oral intake has been inconsistent so TPN to be restarted.  4/19 - per FMTS patient will remain on TPN until albumin > 2.5 and is able to have EGD with GI   Glucose / Insulin: CBGs well controlled, SSI dc'd Electrolytes: Na 136, K 4.1 Renal: SCr 0.47 LFTs / TGs: Trigs 59 LFTs WNL Prealbumin / albumin: Prealbumin 9.2>11.1>17.8, Albumin 1.4>1.7>1.9 Intake / Output; MIVF: UOP 3.7 ml/kg/hr, LBM 4/27 x 1 GI Imaging: 3/31 - barium swallow - unable to pass tablet - study shows esophageal dysmotility, dilation, and narrowing  4/1 CT abd pelvis - anasarca and ascites; likely underlying liver disease  Surgeries / Procedures: see above GI recommends EGD with dilation but pt needs improved nutritional status   Central access: PICC 4/1 TPN start date: 12/22/2019>>4/10; 4/16>>  Nutritional Goals (per RD recommendation on 4/23): kCal: 1750-1950 per day, Protein: 90-105 g/day, Fluid: > 1.7 L  Current Nutrition:  TPN Dysphagia 1 diet - variable, eating 0-100% of meals Prostat TID - 100 cal, 15 g protein, 10 g carbs (each) x 3 yesterday Boost BreezeTID - taking consistently, each supplement provides 250kcal and 9g protein x 3 yesterday  Plan:  Continue current TPN with a cycle over 12 hours - 33 mL/hr x 1 hr, 65 mL/hr x 10 hr, then 33 mL/hr x 1 hr - provides 32g protein and 859kcal per day Oral nutritional supplements are providing 1050 kcal/day and 72g  protein/day, meeting ~75% of protein needs Oral supplements and TPN meet 100% of needs, pt is also taking food as able so she is meeting >100% of nutritional needs  TPN Lytes: continue increased Na; lowered K Monitor TPN labs Mon/Thurs  Consider dc oral phos  Barth Kirks, PharmD, BCPS, BCCCP Clinical Pharmacist [phone]  Please check AMION for all [place] numbers  01/15/2020 7:45 AM

## 2020-01-15 NOTE — Progress Notes (Signed)
Physical Therapy Treatment Patient Details Name: KANYON ARBUTHNOT MRN: [id number] DOB: [birth year] Today's Date: 01/15/2020    History of Present Illness Pt is a 74 y.o. female admitted 12/12/19 with progressive weakness, poor PO intake and BLE swelling; worked up for malnutrition (concern for malignancy). Pt also with anemia, electrolyte imbalance, stage 1 sacral wound. Pt with recurrent asymptomatic SVT; may occur in the settings of severe malnutrition and electrolyte imbalance. PMH includes dysphagia, CKD III, anemia, RA.    PT Comments    Pt showing progression towards goals and improved motivation. During ambulation she stated "I usually turn around here but I'll go a little farther today."  Cues for RW safety. Will continue to follow acutely.   Follow Up Recommendations  SNF;Supervision/Assistance - 24 hour     Equipment Recommendations  (defer to post acute)    Recommendations for Other Services OT consult     Precautions / Restrictions Precautions Precautions: Fall;Other (comment) Precaution Comments: Bowel incontinence, bilateral hand/finger RA, sacral wound, rectal prolapse (occurred 4/16) Restrictions Weight Bearing Restrictions: No    Mobility  Bed Mobility Overal bed mobility: Modified Independent Bed Mobility: Supine to Sit     Supine to sit: Modified independent (Device/Increase time);HOB elevated     General bed mobility comments: HOB elevated, increased time and effort with use of bed rails. No physical assist or cueing required.  Transfers Overall transfer level: Needs assistance Equipment used: Rolling walker (2 wheeled) Transfers: Sit to/from Stand Sit to Stand: Min guard         General transfer comment: min guard for safety. Cues for safe hand placement  Ambulation/Gait Ambulation/Gait assistance: Min guard Gait Distance (Feet): 125 Feet Assistive device: Rolling walker (2 wheeled) Gait Pattern/deviations: Step-through pattern;Decreased  stride length;Wide base of support Gait velocity: Decreased   General Gait Details: No knee buckling noted.  Pt able to tolerate increased distance with decreased assistance.   Stairs             Wheelchair Mobility    Modified Rankin (Stroke Patients Only)       Balance Overall balance assessment: Needs assistance Sitting-balance support: Feet supported Sitting balance-Leahy Scale: Fair     Standing balance support: Single extremity supported;During functional activity Standing balance-Leahy Scale: Fair Standing balance comment: reliant on single UE support in standing                            Cognition Arousal/Alertness: Awake/alert Behavior During Therapy: WFL for tasks assessed/performed Overall Cognitive Status: Within Functional Limits for tasks assessed                                        Exercises      General Comments        Pertinent Vitals/Pain Pain Assessment: Faces Faces Pain Scale: Hurts a little bit Pain Location: L Foot at pressure sore Pain Descriptors / Indicators: Guarding Pain Intervention(s): Monitored during session;Limited activity within patient's tolerance;Repositioned    Home Living                      Prior Function            PT Goals (current goals can now be found in the care plan section) Acute Rehab PT Goals Patient Stated Goal: agreeable to SNF, wants to continue to get stronger PT Goal  Formulation: With patient Time For Goal Achievement: 01/16/20 Potential to Achieve Goals: Fair Progress towards PT goals: Progressing toward goals    Frequency    Min 2X/week      PT Plan Current plan remains appropriate    Co-evaluation              AM-PAC PT "6 Clicks" Mobility   Outcome Measure  Help needed turning from your back to your side while in a flat bed without using bedrails?: A Little Help needed moving from lying on your back to sitting on the side of a flat  bed without using bedrails?: A Little Help needed moving to and from a bed to a chair (including a wheelchair)?: A Little Help needed standing up from a chair using your arms (e.g., wheelchair or bedside chair)?: A Little Help needed to walk in hospital room?: A Little Help needed climbing 3-5 steps with a railing? : A Little 6 Click Score: 18    End of Session Equipment Utilized During Treatment: Gait belt Activity Tolerance: Patient tolerated treatment well Patient left: with call bell/phone within reach;in chair;with chair alarm set;with nursing/sitter in room Nurse Communication: Mobility status PT Visit Diagnosis: Other abnormalities of gait and mobility (R26.89);Muscle weakness (generalized) (M62.81);Difficulty in walking, not elsewhere classified (R26.2) Pain - Right/Left: (bilateral) Pain - part of body: Leg;Ankle and joints of foot     Time: 1100-1118 PT Time Calculation (min) (ACUTE ONLY): 18 min  Charges:  $Gait Training: 8-22 mins                     Benjiman Core, [place] Pager [phone] Acute Rehab   Allena Katz 01/15/2020, 2:19 PM

## 2020-01-15 NOTE — Progress Notes (Signed)
Family Medicine Teaching Service Daily Progress Note Intern Pager: [phone]  Patient name: Deanna Cunningham Medical record number: [id number] Date of birth: 10-06-1945 Age: 74 y.o. Gender: female  Primary Care Provider: Kristie Cowman, MD Consultants: GI, gen surg Code Status: full  Pt Overview and Major Events to Date:  03/26-Admitted 04/01-PICC inserted   Assessment and Plan: Deanna Cunningham a 74 y.o.femalewho presented w/ global weakness, weight loss and failure to thrive. PMHx s/f malnutrition, RA (naive to care), chronic anemia, GERD.  Underweight, severe protein caloriemalnutrition In the setting of known esophageal dysmotility, presbyesophagus: Stable Patient has been meeting nutritional needs with TPN and p.o. intake.  P.o. intake in last 24 hours, 0.9L. Albumin 1.7. Wt. 38.9 kg from admission wt of 42.2kg. Improving LE edema.  -GI and surgery recommends having albumin ~2-2.5 prior to proceeding with EGD. -TPN per pharmacy -Labs per pharmacy: Every Monday, Thursday: CBC, CMP, mag, phosphorus.  -Every Monday prealbumin, triglycerides -Decrease thiamine to 100mg  daily   -Reduce phos to 250mg  once daily  -Replete electrolytes as needed -Daily weight -Strict I's and O's -PT/OT -Dietitian following appreciate recommendations  Left toe discomfort 1 day of left podagra pain. No signs of infection. Will closely monitor during admssion.   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 -Avoid QT prolonging medication  Normocytic anemia: Chronic, stable Last hemoglobin 7.2 on 4/26.   -CBC Mondays and Thursdays  GERD: Chronic, stable Stable -Continue Pepcid 20 mg twice daily -Continue Protonix 40 mg daily -Continue sucralfate with meals and bedtime -May have GI cocktail at patient's request  Sacral wound: Stable/improved -Routine wound care -Position changes frequently every 2 hours -Encourage  out of bed -PT/OT following  Rheumatoid arthritis: Chronic, stable. -Follows with rheumatology outpatient  FEN/GI: -Dysphagia 1 diet, patient said that she is doing well on the liquid - TPN  Prophylaxis: -Lovenox  Disposition: Likely SNF.  Patient still relying upon TPN.  Subjective:  Left toe discomfort.    Objective: Temp:  [97.7 F (36.5 C)-98.8 F (37.1 C)] 97.7 F (36.5 C) (04/29 0725) Pulse Rate:  [93-103] 93 (04/29 0725) Resp:  [16-18] 16 (04/29 0725) BP: (98-133)/(56-65) 98/56 (04/29 0725) SpO2:  [98 %-100 %] 100 % (04/29 0725) Weight:  [40.7 kg] 40.7 kg (04/29 0500)  Physical Exam:  General: Appears well, no acute distress. Age appropriate. Cardiac: RRR, 2/6 murmur at RSB Respiratory: CTAB, normal effort Abdomen: soft, nontender, non distended, +bs Extremities: Mild LE edema. Unremarkable left podagra. Neuro: alert and oriented, no focal deficits Psych: normal affect  Laboratory: Recent Labs  Lab 01/12/20 0454 01/15/20 0550  WBC 7.1 5.2  HGB 7.2* 7.0*  HCT 23.4* 23.1*  PLT 355 317   Recent Labs  Lab 01/12/20 0454 01/12/20 0454 01/13/20 0907 01/14/20 0200 01/15/20 0550  NA 134*   < > 134* 133* 136  K 5.3*   < > 4.7 4.1 4.1  CL 101   < > 102 100 104  CO2 26   < > 28 26 28   BUN 22   < > 22 21 23   CREATININE 0.50   < > 0.49 0.54 0.47  CALCIUM 8.3*   < > 8.3* 8.4* 8.4*  PROT 6.3*  --   --   --  6.2*  BILITOT 0.2*  --   --   --  0.6  ALKPHOS 66  --   --   --  61  ALT 13  --   --   --  13  AST 16  --   --   --  15  GLUCOSE 99   < > 89 115* 101*   < > = values in this interval not displayed.   Imaging/Diagnostic Tests: No new imaging  Gerlene Fee, DO 01/15/2020, 7:59 AM PGY-1, [place] Intern pager: [phone], text pages welcome

## 2020-01-16 ENCOUNTER — Telehealth: Payer: Self-pay | Admitting: Oncology

## 2020-01-16 ENCOUNTER — Inpatient Hospital Stay: Payer: BC Managed Care – PPO

## 2020-01-16 ENCOUNTER — Inpatient Hospital Stay: Payer: BC Managed Care – PPO | Admitting: Nurse Practitioner

## 2020-01-16 DIAGNOSIS — E8809 Other disorders of plasma-protein metabolism, not elsewhere classified: Secondary | ICD-10-CM | POA: Diagnosis not present

## 2020-01-16 DIAGNOSIS — R1310 Dysphagia, unspecified: Secondary | ICD-10-CM | POA: Diagnosis not present

## 2020-01-16 DIAGNOSIS — E43 Unspecified severe protein-calorie malnutrition: Secondary | ICD-10-CM | POA: Diagnosis not present

## 2020-01-16 DIAGNOSIS — K222 Esophageal obstruction: Secondary | ICD-10-CM | POA: Diagnosis not present

## 2020-01-16 LAB — PHOSPHORUS: Phosphorus: 3.7 mg/dL (ref 2.5–4.6)

## 2020-01-16 MED ORDER — TRAVASOL 10 % IV SOLN
INTRAVENOUS | Status: AC
  Filled 2020-01-16: qty 324

## 2020-01-16 NOTE — Progress Notes (Addendum)
Family Medicine Teaching Service Daily Progress Note Intern Pager: [phone]  Patient name: Deanna Cunningham Medical record number: [id number] Date of birth: 1945-10-29 Age: 74 y.o. Gender: female  Primary Care Provider: Kristie Cowman, MD Consultants: GI, Gen Surg Code Status: full  Pt Overview and Major Events to Date:  03/26-Admitted 04/01-PICC inserted   Assessment and Plan:  Deanna Cunningham a 74 y.o.femalewho presented w/ global weakness, weight loss and failure to thrive. PMHx s/f malnutrition, RA (naive to care), chronic anemia, GERD.  Underweight, severe protein caloriemalnutrition In the setting of known esophageal dysmotility, presbyesophagus: Stable Eating broth, apple sauce, and tomato soup. No vomiting. Patient has been meeting nutritional needs with TPN and p.o. intake.  P.o. intake in last 24 hours, 1553ml.  Albumin 1.9 Wt.42.2kg from admission wt of 42.2kg.  -GI and surgery recommends having albumin ~2-2.5 prior to proceeding with EGD. -TPN per pharmacy -Labs per pharmacy: Every Monday, Thursday: CBC, CMP, mag, phosphorus.  -Every Monday prealbumin, triglycerides - every other day BMP to check albumin -thiamine 100mg  daily   -Reduce phos to 250mg  once daily; conisder disontinuing.  - compazine 10mg  q6h prn for n/v - feeding supplement TID -Replete electrolytes as needed -Daily weight  -Strict I's and O's -PT/OT -Dietitian following appreciate recommendations  UTI: Resolved -Completed course of Macrobid, asymptomatic  Bifascicular block  Mod MR/TR:Chronic, stable. -Avoid QT prolonging medication  Normocytic anemia: Chronic, stable Last hemoglobin 7.0 on 4/29.   -CBC Mondays and Thursdays  GERD: Chronic, stable Stable -Continue Pepcid 20 mg twice daily -Continue Protonix 40 mg daily -Continue sucralfate with meals and bedtime -May have GI cocktail at patient's request  Sacral wound: Stable/improved -Routine wound care -Position  changes frequently every 2 hours -Encourage out of bed -PT/OT following  Rheumatoid arthritis: Chronic, stable. Jearld Shines with rheumatology outpatient  Left foot discomfort. Resolved. Complaining of occasional throbbing of her left foot yesterday. No signs of infection. Not swollen on exam.  - continue to monitor for worsening.   FEN/GI: -Dysphagia 1 diet, patient said that she is doing well on the liquid - TPN  Prophylaxis: -Lovenox  Disposition: Likely SNF.  Patient still relying upon TPN.  Subjective:  No complaints other than occasional mild left foot 'throbbing' which she has had before.  Stated she had a nose bleed last night and she has had these in the past but 'not in a while'. Her nurse this morning states he did not hear anything about it in signout but states he knew she had a small nose bleed a few weeks ago when he was taking care of her.  States her 'palate' is a little sore from her dentures.   Objective: Temp:  [98.2 F (36.8 C)-99 F (37.2 C)] 99 F (37.2 C) (04/30 1945) Pulse Rate:  [58-106] 100 (04/30 1945) Resp:  [16-18] 18 (04/30 1945) BP: (93-119)/(48-65) 116/52 (04/30 1945) SpO2:  [98 %-100 %] 98 % (04/30 1945) Weight:  [42.2 kg] 42.2 kg (04/30 0545) Physical Exam: General: alert, oriented.  Cachectic. No acute distress.  HEENT: no erythema/ulcerations seen in oropharynx.  No bleeding seen in the nasal cavity.  Cardiovascular: RRR. No murmurs. 2+ pulse radial pulse.  Respiratory: LCTAB. No crackles or wheezes.   Abdomen: scaphoid abdomen. Nbs. No tenderness.   Extremities: 1+ pitting edema b/l on shins.    Laboratory: Recent Labs  Lab 01/12/20 0454 01/15/20 0550  WBC 7.1 5.2  HGB 7.2* 7.0*  HCT 23.4* 23.1*  PLT 355 317   Recent Labs  Lab 01/12/20 0454 01/12/20 0454 01/13/20 0907 01/14/20 0200 01/15/20 0550  NA 134*   < > 134* 133* 136  K 5.3*   < > 4.7 4.1 4.1  CL 101   < > 102 100 104  CO2 26   < > 28 26 28   BUN 22   < > 22 21  23   CREATININE 0.50   < > 0.49 0.54 0.47  CALCIUM 8.3*   < > 8.3* 8.4* 8.4*  PROT 6.3*  --   --   --  6.2*  BILITOT 0.2*  --   --   --  0.6  ALKPHOS 66  --   --   --  61  ALT 13  --   --   --  13  AST 16  --   --   --  15  GLUCOSE 99   < > 89 115* 101*   < > = values in this interval not displayed.     Imaging/Diagnostic Tests:   Benay Pike, MD 01/16/2020, 10:53 PM PGY-2, [place] Intern pager: [phone], text pages welcome

## 2020-01-16 NOTE — Progress Notes (Signed)
PHARMACY - TOTAL PARENTERAL NUTRITION CONSULT NOTE   Indication: malnutrition  Patient Measurements: Height: 5\' 4"  (162.6 cm) Weight: 42.2 kg (93 lb 0.6 oz) IBW/kg (Calculated) : 54.7 TPN AdjBW (KG): 50.7 Body mass index is 15.97 kg/m.  Assessment: 74 yo female initially presented on 3/26 for weakness. Patient with PMH of RA, chronic anemia and GERD. She has had ongoing poor intake since Dec 2020 with LE swelling and unintentional weight loss. Pt previously on TPN from 4/5>4/10 which was stopped since pt had adequate oral intake. However, oral intake has been inconsistent so TPN to be restarted.  4/19 - per FMTS patient will remain on TPN until albumin > 2.5 and is able to have EGD with GI   Glucose / Insulin: CBGs well controlled, SSI dc'd Electrolytes: Na 136, K 4.1, oral phos supplement reduced to once daily Renal: SCr 0.47 LFTs / TGs: Trigs 59, LFTs WNL Prealbumin / albumin: Prealbumin 9.2>11.1>17.8, Albumin 1.4>1.7>1.9 Intake / Output; MIVF: UOP 2.4 ml/kg/hr, LBM 4/27 x 1 GI Imaging: 3/31 - barium swallow - unable to pass tablet - study shows esophageal dysmotility, dilation, and narrowing  4/1 CT abd pelvis - anasarca and ascites; likely underlying liver disease  Surgeries / Procedures: see above GI recommends EGD with dilation but pt needs improved nutritional status   Central access: PICC 4/1 TPN start date: 12/22/2019>>4/10; 4/16>>  Nutritional Goals (per RD recommendation on 4/28): kCal: 1750-1950 per day, Protein: 90-105 g/day, Fluid: > 1.7 L  Current Nutrition:  TPN Dysphagia 1 diet - variable, eating 0-100% of meals Prostat TID - 100 cal, 15 g protein, 10 g carbs (each) x 3 yesterday Boost BreezeTID - taking consistently, each supplement provides 250kcal and 9g protein x 3 yesterday  Plan:  Continue current TPN with a cycle over 12 hours - 33 mL/hr x 1 hr, 65 mL/hr x 10 hr, then 33 mL/hr x 1 hr - provides 32g protein and 859kcal per day Oral nutritional  supplements are providing 1050 kcal/day and 72g protein/day, meeting ~75% of protein needs Oral supplements and TPN meet 100% of needs, pt is also taking food as able so she is meeting >100% of nutritional needs  TPN Lytes: continue increased Na; lowered K Monitor TPN labs Mon/Thurs  Salome Arnt, PharmD, BCPS Clinical Pharmacist Please see AMION for all pharmacy numbers 01/16/2020 8:03 AM

## 2020-01-16 NOTE — Progress Notes (Signed)
VAST consulted to obtain lab which was missed this am. VAST RN will collect lab with line care this evening to help prevent infection by minimizing breaks in the line.

## 2020-01-16 NOTE — Progress Notes (Signed)
Family Medicine Teaching Service Daily Progress Note Intern Pager: [phone]  Patient name: ROSHELL ACEITUNO Medical record number: [id number] Date of birth: [birth year] Age: 74 y.o. Gender: female  Primary Care Provider: Kristie Cowman, MD Consultants: GI, gen surg Code Status: full  Pt Overview and Major Events to Date:  03/26-Admitted 04/01-PICC inserted   Assessment and Plan:  TANTANIA CARGAL a 74 y.o.femalewho presented w/ global weakness, weight loss and failure to thrive. PMHx s/f malnutrition, RA (naive to care), chronic anemia, GERD.  Underweight, severe protein caloriemalnutrition In the setting of known esophageal dysmotility, presbyesophagus: Stable Patient has been meeting nutritional needs with TPN and p.o. intake.  P.o. intake in last 24 hours, 2.4L. Albumin 1.9. Wt. 42.2 kg from admission wt of 42.2kg. Improving LE edema.  -GI and surgery recommends having albumin ~2-2.5 prior to proceeding with EGD. -TPN per pharmacy -Labs per pharmacy: Every Monday, Thursday: CBC, CMP, mag, phosphorus.  -Every Monday prealbumin, triglycerides -Decrease thiamine to 100mg  daily   -Reduce phos to 250mg  once daily; conisder disontinuing.  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 -Avoid QT prolonging medication  Normocytic anemia: Chronic, stable Last hemoglobin 7.0 on 4/29.   -CBC Mondays and Thursdays  GERD: Chronic, stable Stable -Continue Pepcid 20 mg twice daily -Continue Protonix 40 mg daily -Continue sucralfate with meals and bedtime -May have GI cocktail at patient's request  Sacral wound: Stable/improved -Routine wound care -Position changes frequently every 2 hours -Encourage out of bed -PT/OT following  Rheumatoid arthritis: Chronic, stable. Jearld Shines with  rheumatology outpatient  Left toe discomfort. Resolved. 1 day of left podagra pain. No signs of infection. Will closely monitor during admssion.   FEN/GI: -Dysphagia 1 diet, patient said that she is doing well on the liquid - TPN  Prophylaxis: -Lovenox  Disposition: Likely SNF.  Patient still relying upon TPN.  Subjective:  No concerns today.    Objective: Temp:  [98.2 F (36.8 C)-98.5 F (36.9 C)] 98.5 F (36.9 C) (04/30 0545) Pulse Rate:  [100-106] 106 (04/30 0545) Resp:  [16-17] 16 (04/30 0545) BP: (93-114)/(48-65) 114/48 (04/30 0545) SpO2:  [96 %-100 %] 100 % (04/30 0545) Weight:  [42.2 kg] 42.2 kg (04/30 0545)  Physical Exam:  General: Appears well, no acute distress. Age appropriate. Cardiac: RRR, S3 heart sound at RSB Respiratory: CTAB, normal effort Abdomen: soft, nontender, nondistended Extremities: Mild LE edema improving Neuro: alert and oriented x4  Laboratory: Recent Labs  Lab 01/12/20 0454 01/15/20 0550  WBC 7.1 5.2  HGB 7.2* 7.0*  HCT 23.4* 23.1*  PLT 355 317   Recent Labs  Lab 01/12/20 0454 01/12/20 0454 01/13/20 0907 01/14/20 0200 01/15/20 0550  NA 134*   < > 134* 133* 136  K 5.3*   < > 4.7 4.1 4.1  CL 101   < > 102 100 104  CO2 26   < > 28 26 28   BUN 22   < > 22 21 23   CREATININE 0.50   < > 0.49 0.54 0.47  CALCIUM 8.3*   < > 8.3* 8.4* 8.4*  PROT 6.3*  --   --   --  6.2*  BILITOT 0.2*  --   --   --  0.6  ALKPHOS 66  --   --   --  61  ALT 13  --   --   --  13  AST 16  --   --   --  15  GLUCOSE 99   < > 89 115* 101*   < > = values in this interval not displayed.   Imaging/Diagnostic Tests: No new imaging  Gerlene Fee, DO 01/16/2020, 8:20 AM PGY-1, [place] Intern pager: [phone], text pages welcome

## 2020-01-16 NOTE — Telephone Encounter (Signed)
Scheduled appt per 4/30 sch message - unable to reach pt . Left message with appt date and time   

## 2020-01-17 ENCOUNTER — Encounter (HOSPITAL_COMMUNITY): Payer: Self-pay | Admitting: Family Medicine

## 2020-01-17 DIAGNOSIS — R1310 Dysphagia, unspecified: Secondary | ICD-10-CM | POA: Diagnosis not present

## 2020-01-17 DIAGNOSIS — K222 Esophageal obstruction: Secondary | ICD-10-CM | POA: Diagnosis not present

## 2020-01-17 DIAGNOSIS — K224 Dyskinesia of esophagus: Secondary | ICD-10-CM | POA: Diagnosis not present

## 2020-01-17 DIAGNOSIS — E8809 Other disorders of plasma-protein metabolism, not elsewhere classified: Secondary | ICD-10-CM | POA: Diagnosis not present

## 2020-01-17 DIAGNOSIS — L89152 Pressure ulcer of sacral region, stage 2: Secondary | ICD-10-CM

## 2020-01-17 LAB — BASIC METABOLIC PANEL
Anion gap: 5 (ref 5–15)
BUN: 25 mg/dL — ABNORMAL HIGH (ref 8–23)
CO2: 25 mmol/L (ref 22–32)
Calcium: 8.2 mg/dL — ABNORMAL LOW (ref 8.9–10.3)
Chloride: 108 mmol/L (ref 98–111)
Creatinine, Ser: 0.48 mg/dL (ref 0.44–1.00)
GFR calc Af Amer: >60 mL/min (ref >60)
GFR calc non Af Amer: >60 mL/min (ref >60)
Glucose, Bld: 103 mg/dL — ABNORMAL HIGH (ref 70–99)
Potassium: 3.6 mmol/L (ref 3.5–5.1)
Sodium: 138 mmol/L (ref 135–145)

## 2020-01-17 MED ORDER — TRAVASOL 10 % IV SOLN
INTRAVENOUS | Status: AC
  Filled 2020-01-17: qty 324

## 2020-01-17 NOTE — Progress Notes (Signed)
PHARMACY - TOTAL PARENTERAL NUTRITION CONSULT NOTE   Indication: malnutrition  Patient Measurements: Height: 5\' 4"  (162.6 cm) Weight: 42.2 kg (93 lb 0.6 oz) IBW/kg (Calculated) : 54.7 TPN AdjBW (KG): 50.7 Body mass index is 15.97 kg/m.  Assessment: 74 yo female initially presented on 3/26 for weakness. Patient with PMH of RA, chronic anemia and GERD. She has had ongoing poor intake since Dec 2020 with LE swelling and unintentional weight loss. Pt previously on TPN from 4/5>4/10 which was stopped since pt had adequate oral intake. However, oral intake has been inconsistent so TPN to be restarted.  4/19 - per FMTS patient will remain on TPN until albumin > 2.5 and is able to have EGD with GI   Glucose / Insulin: CBGs well controlled, SSI dc'd Electrolytes: Na 138, K 3.6 Renal: SCr 0. LFTs / TGs: Trigs 59, LFTs WNL Prealbumin / albumin: Prealbumin 9.2>11.1>17.8, Albumin 1.4>1.7>1.9 Intake / Output; MIVF: UOP 2.6 ml/kg/hr, LBM 4/29 x 1 GI Imaging: 3/31 - barium swallow - unable to pass tablet - study shows esophageal dysmotility, dilation, and narrowing  4/1 CT abd pelvis - anasarca and ascites; likely underlying liver disease  Surgeries / Procedures: see above GI recommends EGD with dilation but pt needs improved nutritional status   Central access: PICC 4/1 TPN start date: 12/22/2019>>4/10; 4/16>>  Nutritional Goals (per RD recommendation on 4/28): kCal: 1750-1950 per day, Protein: 90-105 g/day, Fluid: > 1.7 L  Current Nutrition:  TPN Dysphagia 1 diet - variable, eating 0-100% of meals Prostat TID - 100 cal, 15 g protein, 10 g carbs (each) x 3 yesterday Boost BreezeTID - taking consistently, each supplement provides 250kcal and 9g protein x 3 yesterday  Plan:  Continue current TPN with a cycle over 12 hours - 33 mL/hr x 1 hr, 65 mL/hr x 10 hr, then 33 mL/hr x 1 hr - provides 32g protein and 859kcal per day Oral nutritional supplements are providing 1050 kcal/day and 72g  protein/day, meeting ~75% of protein needs Oral supplements and TPN meet 100% of needs, pt is also taking food as able so she is meeting >100% of nutritional needs  TPN Lytes: continue increased Na; will increase K Monitor TPN labs Mon/Thurs  Barth Kirks, PharmD, BCPS, BCCCP Clinical Pharmacist [phone]  Please check AMION for all [place] numbers  01/17/2020 8:09 AM

## 2020-01-17 NOTE — Plan of Care (Signed)
  Problem: Education: Goal: Knowledge of General Education information will improve Description: Including pain rating scale, medication(s)/side effects and non-pharmacologic comfort measures Outcome: Progressing   Problem: Pain Managment: Goal: General experience of comfort will improve Outcome: Progressing   Problem: Safety: Goal: Ability to remain free from injury will improve Outcome: Progressing   

## 2020-01-17 NOTE — Plan of Care (Signed)

## 2020-01-18 DIAGNOSIS — R1310 Dysphagia, unspecified: Secondary | ICD-10-CM | POA: Diagnosis not present

## 2020-01-18 DIAGNOSIS — K222 Esophageal obstruction: Secondary | ICD-10-CM | POA: Diagnosis not present

## 2020-01-18 DIAGNOSIS — E8809 Other disorders of plasma-protein metabolism, not elsewhere classified: Secondary | ICD-10-CM | POA: Diagnosis not present

## 2020-01-18 DIAGNOSIS — K224 Dyskinesia of esophagus: Secondary | ICD-10-CM | POA: Diagnosis not present

## 2020-01-18 MED ORDER — TRAVASOL 10 % IV SOLN
INTRAVENOUS | Status: AC
  Filled 2020-01-18: qty 324

## 2020-01-18 NOTE — Progress Notes (Signed)
Family Medicine Teaching Service Daily Progress Note Intern Pager: [phone]  Patient name: Deanna Cunningham Medical record number: [id number] Date of birth: [birth year] Age: 74 y.o. Gender: female  Primary Care Provider: Kristie Cowman, MD Consultants: GI, Gen Surg Code Status: full  Pt Overview and Major Events to Date:  03/26-Admitted 04/01-PICC inserted   Assessment and Plan:  Deanna Cunningham a 74 y.o.femalewho presented w/ global weakness, weight loss and failure to thrive. PMHx s/f malnutrition, RA (naive to care), chronic anemia, GERD.  Underweight, severe protein caloriemalnutrition In the setting of known esophageal dysmotility, presbyesophagus: Stable PO intake almost a L yesterday +TPN.  Wt.stable at 42.2kg from admission  -GI and surgery recommends having albumin ~2-2.5 prior to proceeding with EGD. -TPN per pharmacy -Labs per pharmacy: Every Monday, Thursday: CBC, CMP, mag, phosphorus.  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 -Avoid QT prolonging medication  Normocytic anemia: Chronic, stable Last hemoglobin 7.0 on 4/29.   -CBC Mondays and Thursdays  GERD: Chronic, stable -Continue Pepcid 20 mg twice daily -Continue Protonix 40 mg daily -Continue sucralfate with meals and bedtime -May have GI cocktail at patient's request  Sacral wound: Stable/improved -Routine wound care -Position changes frequently every 2 hours -Encourage out of bed -PT/OT following  Rheumatoid arthritis: Chronic, stable. -Follows with rheumatology outpatient  FEN/GI: -Dysphagia 1 diet - TPN Prophylaxis: -Lovenox  Disposition: Likely SNF.  Subjective:  Patient has no complaints this morning. She feels that she is doing well. She is tired  as she stayed up late last night.   Objective: Temp:  [98.1 F (36.7 C)-98.8 F (37.1 C)] 98.5 F (36.9 C) (05/02 0337) Pulse Rate:  [73-98] 98 (05/02 0337) Resp:  [16-20] 20 (05/02 0337) BP: (96-113)/(54-60) 96/54 (05/02 0337) SpO2:  [98 %-100 %] 100 % (05/02 0337) Weight:  [42.2 kg] 42.2 kg (05/01 1030) Physical Exam: General: alert, oriented.  Cachectic. No acute distress.  Cardiovascular: RRR. No murmurs. 2+ pulse radial pulse.  Respiratory: CTAB. No crackles or wheezes.   Abdomen: scaphoid abdomen. Nbs. No tenderness.   Extremities: traceedema b/l on shins.    Laboratory: Recent Labs  Lab 01/12/20 0454 01/15/20 0550  WBC 7.1 5.2  HGB 7.2* 7.0*  HCT 23.4* 23.1*  PLT 355 317   Recent Labs  Lab 01/12/20 0454 01/13/20 0907 01/14/20 0200 01/15/20 0550 01/17/20 0418  NA 134*   < > 133* 136 138  K 5.3*   < > 4.1 4.1 3.6  CL 101   < > 100 104 108  CO2 26   < > 26 28 25   BUN 22   < > 21 23 25*  CREATININE 0.50   < > 0.54 0.47 0.48  CALCIUM 8.3*   < > 8.4* 8.4* 8.2*  PROT 6.3*  --   --  6.2*  --   BILITOT 0.2*  --   --  0.6  --   ALKPHOS 66  --   --  61  --   ALT 13  --   --  13  --   AST 16  --   --  15  --   GLUCOSE 99   < > 115* 101* 103*   < > = values in this interval not displayed.    Imaging/Diagnostic Tests: DG esophagus with barium  swallow: 1. Similar appearance to previous examination, with esophageal dysmotility, dilatation and persistent smooth narrowing of the distal esophageal lumen. 2. A barium tablet was not observed to pass into the stomach, although this may be related to the required supine nature of the examination. The lumen of the distal esophagus appears slightly larger than the diameter of the pill. No evidence of esophageal ulceration.  Deanna Osmond, DO 01/18/2020, 6:16 AM PGY-2, [place] Intern pager: [phone], text pages welcome

## 2020-01-18 NOTE — Progress Notes (Signed)
PHARMACY - TOTAL PARENTERAL NUTRITION CONSULT NOTE   Indication: malnutrition  Patient Measurements: Height: 5\' 4"  (162.6 cm) Weight: 42.2 kg (93 lb 0.6 oz) IBW/kg (Calculated) : 54.7 TPN AdjBW (KG): 42.2 Body mass index is 15.97 kg/m.  Assessment: 74 yo female initially presented on 3/26 for weakness. Patient with PMH of RA, chronic anemia and GERD. She has had ongoing poor intake since Dec 2020 with LE swelling and unintentional weight loss. Pt previously on TPN from 4/5>4/10 which was stopped since pt had adequate oral intake. However, oral intake has been inconsistent so TPN to be restarted.  4/19 - per FMTS patient will remain on TPN until albumin > 2.5 and is able to have EGD with GI   Glucose / Insulin: CBGs well controlled, SSI dc'd Electrolytes: no labs today Renal: no labs today LFTs / TGs: Trigs 59, LFTs WNL Prealbumin / albumin: Prealbumin 9.2>11.1>17.8, Albumin 1.4>1.7>1.9 Intake / Output; MIVF: UOP 2.2 ml/kg/hr, LBM 5/1 x 1 GI Imaging: 3/31 - barium swallow - unable to pass tablet - study shows esophageal dysmotility, dilation, and narrowing  4/1 CT abd pelvis - anasarca and ascites; likely underlying liver disease  Surgeries / Procedures: see above GI recommends EGD with dilation but pt needs improved nutritional status   Central access: PICC 4/1 TPN start date: 12/22/2019>>4/10; 4/16>>  Nutritional Goals (per RD recommendation on 4/28): kCal: 1750-1950 per day, Protein: 90-105 g/day, Fluid: > 1.7 L  Current Nutrition:  TPN Dysphagia 1 diet - variable, eating 0-100% of meals Prostat TID - 100 cal, 15 g protein, 10 g carbs (each) x 3 yesterday Boost BreezeTID - taking consistently, each supplement provides 250kcal and 9g protein x 3 yesterday  Plan:  Continue current TPN with a cycle over 12 hours - 33 mL/hr x 1 hr, 65 mL/hr x 10 hr, then 33 mL/hr x 1 hr - provides 32g protein and 859kcal per day Oral nutritional supplements are providing 1050 kcal/day and 72g  protein/day, meeting ~75% of protein needs Oral supplements and TPN meet 100% of needs, pt is also taking food as able so she is meeting >100% of nutritional needs  TPN Lytes: continue increased Na; will increase K Monitor TPN labs Mon/Thurs - dc tpn in am if alb > 2?  Barth Kirks, PharmD, BCPS, BCCCP Clinical Pharmacist [phone]  Please check AMION for all [place] numbers  01/18/2020 7:49 AM

## 2020-01-19 DIAGNOSIS — E43 Unspecified severe protein-calorie malnutrition: Secondary | ICD-10-CM | POA: Diagnosis not present

## 2020-01-19 DIAGNOSIS — R1310 Dysphagia, unspecified: Secondary | ICD-10-CM | POA: Diagnosis not present

## 2020-01-19 DIAGNOSIS — K222 Esophageal obstruction: Secondary | ICD-10-CM | POA: Diagnosis not present

## 2020-01-19 DIAGNOSIS — E8809 Other disorders of plasma-protein metabolism, not elsewhere classified: Secondary | ICD-10-CM | POA: Diagnosis not present

## 2020-01-19 LAB — CBC
HCT: 22.2 % — ABNORMAL LOW (ref 36.0–46.0)
Hemoglobin: 6.8 g/dL — CL (ref 12.0–15.0)
MCH: 30.2 pg (ref 26.0–34.0)
MCHC: 30.6 g/dL (ref 30.0–36.0)
MCV: 98.7 fL (ref 80.0–100.0)
Platelets: 278 10*3/uL (ref 150–400)
RBC: 2.25 MIL/uL — ABNORMAL LOW (ref 3.87–5.11)
RDW: 15.4 % (ref 11.5–15.5)
WBC: 5 10*3/uL (ref 4.0–10.5)
nRBC: 0 % (ref 0.0–0.2)

## 2020-01-19 LAB — COMPREHENSIVE METABOLIC PANEL
ALT: 13 U/L (ref 0–44)
AST: 15 U/L (ref 15–41)
Albumin: 1.9 g/dL — ABNORMAL LOW (ref 3.5–5.0)
Alkaline Phosphatase: 58 U/L (ref 38–126)
Anion gap: 5 (ref 5–15)
BUN: 24 mg/dL — ABNORMAL HIGH (ref 8–23)
CO2: 26 mmol/L (ref 22–32)
Calcium: 8.4 mg/dL — ABNORMAL LOW (ref 8.9–10.3)
Chloride: 107 mmol/L (ref 98–111)
Creatinine, Ser: 0.46 mg/dL (ref 0.44–1.00)
GFR calc Af Amer: >60 mL/min (ref >60)
GFR calc non Af Amer: >60 mL/min (ref >60)
Glucose, Bld: 98 mg/dL (ref 70–99)
Potassium: 3.8 mmol/L (ref 3.5–5.1)
Sodium: 138 mmol/L (ref 135–145)
Total Bilirubin: 0.2 mg/dL — ABNORMAL LOW (ref 0.3–1.2)
Total Protein: 6.2 g/dL — ABNORMAL LOW (ref 6.5–8.1)

## 2020-01-19 LAB — PHOSPHORUS: Phosphorus: 4 mg/dL (ref 2.5–4.6)

## 2020-01-19 LAB — PREPARE RBC (CROSSMATCH)

## 2020-01-19 LAB — TRIGLYCERIDES: Triglycerides: 55 mg/dL (ref <150)

## 2020-01-19 LAB — HEMOGLOBIN AND HEMATOCRIT, BLOOD
HCT: 27.3 % — ABNORMAL LOW (ref 36.0–46.0)
Hemoglobin: 8.5 g/dL — ABNORMAL LOW (ref 12.0–15.0)

## 2020-01-19 LAB — MAGNESIUM: Magnesium: 1.8 mg/dL (ref 1.7–2.4)

## 2020-01-19 LAB — PREALBUMIN: Prealbumin: 19.1 mg/dL (ref 18–38)

## 2020-01-19 MED ORDER — SODIUM CHLORIDE 0.9% IV SOLUTION: Freq: Once | INTRAVENOUS | Status: DC

## 2020-01-19 MED ORDER — TRAVASOL 10 % IV SOLN
INTRAVENOUS | Status: AC
  Filled 2020-01-19: qty 324

## 2020-01-19 NOTE — Progress Notes (Signed)
Family Medicine Teaching Service Daily Progress Note Intern Pager: [phone]  Patient name: Deanna Cunningham Medical record number: [id number] Date of birth: 1946-02-10 Age: 74 y.o. Gender: female  Primary Care Provider: Kristie Cowman, MD Consultants: GI, Gen Surg Code Status: full code  Pt Overview and Major Events to Date:  03/26-Admitted 04/01-PICC inserted   Assessment and Plan:  Deanna Cunningham a 74 y.o.femalewho presented w/ global weakness, weight loss and failure to thrive. PMHx s/f malnutrition, RA (naive to care), chronic anemia, GERD.  Underweight, severe protein caloriemalnutrition In the setting of known esophageal dysmotility, presbyesophagus: Stable Patient resting comfortably in bed with no acute complaints today.  Patient states that TPN was leaking but nursing was able to fix it for her.  She has had no problems since. -GI and surgery recommends having albumin ~2-2.5 prior to proceeding with EGD. -TPN per pharmacy -Labs per pharmacy: Every Monday & Thursday: CBC, CMP, mag, phosphorus.    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 - Avoid QT prolonging medication  Normocytic anemia: Chronic, stable Hemoglobin 6.8 today.  Patient to receive hemoglobin transfusion 1 unit. -Follow-up posttransfusion hemoglobin -CBC Mondays and Thursdays  GERD: Chronic, stable - Continue Pepcid 20 mg twice daily - Continue Protonix 40 mg daily - Continue sucralfate with meals and bedtime - May have GI cocktail at patient's request  Sacral wound: Stable/improved - Routine wound care - Position changes frequently every 2 hours - Encourage out of bed - PT/OT following  Rheumatoid arthritis: Chronic, stable. - Follows  with rheumatology outpatient  FEN/GI: - Dysphagia 1 diet - TPN  Prophylaxis: lovenox   Disposition: Likely SNF  Subjective:  Patient reports he is doing well this morning.  States that she had some leakage from her TPN access but was able to have that fixed by nursing.  Denies any pain, shortness of breath, or nausea.  Objective: Temp:  [97.9 F (36.6 C)-98.2 F (36.8 C)] 98 F (36.7 C) (05/03 0500) Pulse Rate:  [86-96] 86 (05/03 0500) Resp:  [15-17] 15 (05/03 0500) BP: (111-115)/(62-63) 111/63 (05/03 0500) SpO2:  [98 %-100 %] 100 % (05/03 0500)  Physical Exam: General: Cachectic, elderly-appearing female lying in bed in no acute distress Cardiovascular: Regular rate and rhythm without murmurs, radial pulses palpable bilaterally Respiratory: Clear to auscultation without wheezing, no increased work of breathing, no crackles Abdomen: Abdomen is soft, nontender, bowel sounds minimal Extremities: Trace lower extremity edema bilaterally  Laboratory: Recent Labs  Lab 01/15/20 0550 01/19/20 0441  WBC 5.2 5.0  HGB 7.0* 6.8*  HCT 23.1* 22.2*  PLT 317 278   Recent Labs  Lab 01/15/20 0550 01/17/20 0418 01/19/20 0441  NA 136 138 138  K 4.1 3.6 3.8  CL 104 108 107  CO2 28 25 26   BUN 23 25* 24*  CREATININE 0.47 0.48 0.46  CALCIUM 8.4* 8.2* 8.4*  PROT 6.2*  --  6.2*  BILITOT 0.6  --  0.2*  ALKPHOS 61  --  58  ALT 13  --  13  AST 15  --  15  GLUCOSE 101* 103* 98    Imaging/Diagnostic Tests: No results found.   Stark Klein, MD 01/19/2020, 9:50 AM PGY-2, [place] Intern pager: [phone], text pages welcome

## 2020-01-19 NOTE — Progress Notes (Signed)
PHARMACY - TOTAL PARENTERAL NUTRITION CONSULT NOTE   Indication: malnutrition  Patient Measurements: Height: 5\' 4"  (162.6 cm) Weight: 42.2 kg (93 lb 0.6 oz) IBW/kg (Calculated) : 54.7 TPN AdjBW (KG): 42.2 Body mass index is 15.97 kg/m.  Assessment: 74 yo female initially presented on 3/26 for weakness. Patient with PMH of RA, chronic anemia and GERD. She has had ongoing poor intake since Dec 2020 with LE swelling and unintentional weight loss. Pt previously on TPN from 4/5>4/10 which was stopped since pt had adequate oral intake. However, oral intake has been inconsistent so TPN to be restarted.  4/19 - per FMTS patient will remain on TPN until albumin > 2.5 and is able to have EGD with GI   Glucose / Insulin: CBGs well controlled, SSI dc'd Electrolytes: wnl Renal: wnl LFTs / TGs: Trigs 55, LFTs WNL Prealbumin / albumin: Prealbumin 9.2>11.1>17.8>19.1, Albumin 1.4>1.7>1.9>1.9 Intake / Output; MIVF: UOP 3.6 ml/kg/hr, LBM 5/1 x 1 GI Imaging: 3/31 - barium swallow - unable to pass tablet - study shows esophageal dysmotility, dilation, and narrowing  4/1 CT abd pelvis - anasarca and ascites; likely underlying liver disease  Surgeries / Procedures: see above GI recommends EGD with dilation but pt needs improved nutritional status   Central access: PICC 4/1 TPN start date: 12/22/2019>>4/10; 4/16>>  Nutritional Goals (per RD recommendation on 4/28): kCal: 1750-1950 per day, Protein: 90-105 g/day, Fluid: > 1.7 L  Current Nutrition:  TPN Dysphagia 1 diet - variable, eating 0-100% of meals Prostat TID - 100 cal, 15 g protein, 10 g carbs (each) x 3 yesterday Boost BreezeTID - taking consistently, each supplement provides 250kcal and 9g protein x 3 yesterday  Plan:  Continue current TPN with a cycle over 12 hours - 33 mL/hr x 1 hr, 65 mL/hr x 10 hr, then 33 mL/hr x 1 hr - provides 32g protein and 859kcal per day Oral nutritional supplements are providing 1050 kcal/day and 72g protein/day,  meeting ~75% of protein needs Oral supplements and TPN meet 100% of needs, pt is also taking food as able so she is meeting >100% of nutritional needs  TPN Lytes: continue increased Na; will increase K Monitor TPN labs Mon/Thurs - dc tpn if alb > 2?  Thank you for involving pharmacy in this patient's care.  Renold Genta, PharmD, BCPS Clinical Pharmacist Clinical phone for 01/19/2020 until 3p is [phone] 01/19/2020 4:03 PM  **Pharmacist phone directory can be found on [email or web address] listed under [place]**

## 2020-01-19 NOTE — Progress Notes (Addendum)
Called patient regarding this morning's lab draw. Let her know that Hb is 6.8 and she requires a blood transfusion. She provided consent for this and said she will let her brother know later in the day.   Called pharmacist and spoke with on call pharmacy re patient's TPN. She said that patient's TPN finishes at [facility] and gets a total of 713 ml overnight and should not cause an issue with a blood transfusion.   [name] MD PGY-1, [place] [facility]

## 2020-01-19 NOTE — Plan of Care (Signed)
  Problem: Education: Goal: Knowledge of General Education information will improve Description: Including pain rating scale, medication(s)/side effects and non-pharmacologic comfort measures Outcome: Progressing   Problem: Clinical Measurements: Goal: Diagnostic test results will improve Outcome: Not Progressing   Problem: Nutrition: Goal: Adequate nutrition will be maintained Outcome: Not Progressing   Problem: Safety: Goal: Ability to remain free from injury will improve Outcome: Progressing

## 2020-01-19 NOTE — Progress Notes (Signed)
CRITICAL VALUE ALERT  Critical Value:   Hemoglobin 6.8  Date & Time Notied:  01/19/2020   0533  Provider Notified: Dr. Posey Pronto  Orders Received/Actions taken: Order to transfuse 1 unit PRBC

## 2020-01-20 DIAGNOSIS — K222 Esophageal obstruction: Secondary | ICD-10-CM | POA: Diagnosis not present

## 2020-01-20 DIAGNOSIS — R634 Abnormal weight loss: Secondary | ICD-10-CM | POA: Diagnosis not present

## 2020-01-20 DIAGNOSIS — R1310 Dysphagia, unspecified: Secondary | ICD-10-CM | POA: Diagnosis not present

## 2020-01-20 DIAGNOSIS — E43 Unspecified severe protein-calorie malnutrition: Secondary | ICD-10-CM | POA: Diagnosis not present

## 2020-01-20 LAB — TYPE AND SCREEN
ABO/RH(D): O POS
Antibody Screen: NEGATIVE
Unit division: 0

## 2020-01-20 LAB — BPAM RBC
Blood Product Expiration Date: 202105292359
ISSUE DATE / TIME: 202105031058
Unit Type and Rh: 5100

## 2020-01-20 MED ORDER — TRAVASOL 10 % IV SOLN
INTRAVENOUS | Status: AC
  Filled 2020-01-20: qty 324

## 2020-01-20 NOTE — Progress Notes (Signed)
Family Medicine Teaching Service Daily Progress Note Intern Pager: [phone]  Patient name: Deanna Cunningham Medical record number: [id number] Date of birth: [birth year] Age: 74 y.o. Gender: female  Primary Care Provider: Kristie Cowman, MD Consultants: GI, Gen Surg Code Status: full code  Pt Overview and Major Events to Date:  03/26-Admitted 04/01-PICC inserted   Assessment and Plan:  Deanna Cunningham a 74 y.o.femalewho presented w/ global weakness, weight loss and failure to thrive. PMHx s/f malnutrition, RA (naive to care), chronic anemia, GERD.  Underweight, severe protein caloriemalnutrition In the setting of known esophageal dysmotility, presbyesophagus: Stable Patient resting comfortably in bed, no signs or complaints morning. Last labs from 5/3 with mag 1.8, albumin stable at 1.9 and prealbumin steadily increasing as of yesterday. -GI and surgery recommends having albumin ~2-2.5 prior to proceeding with EGD. -TPN per pharmacy -Labs per pharmacy: Every Monday & Thursday: CBC, CMP, mag, phosphorus.    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 - Avoid QT prolonging medication  Normocytic anemia: Chronic, stable Hemoglobin 6.8 yesterday and patient received 1 unit pRBCs. Hemoglobin today improved to 8.5.  -CBC Mondays and Thursdays  GERD: Chronic, stable - Continue Pepcid 20 mg twice daily - Continue Protonix 40 mg daily - Continue sucralfate with meals and bedtime - May have GI cocktail at patient's request  Sacral wound: Stable/improved - Routine wound care - Position changes frequently every 2 hours - Encourage out of bed - PT/OT following  Rheumatoid arthritis: Chronic, stable. - Follows with rheumatology  outpatient  FEN/GI: - Dysphagia 1 diet - TPN  Prophylaxis: lovenox   Disposition: Patient awaiting discharge to potential skilled nursing facility, medically stable on TPN  Subjective:  Patient reports she has no new complaints today and only question is regarding her albumin to which she was told it is 1.9 and stable.  Objective: Temp:  [98 F (36.7 C)-98.6 F (37 C)] 98.3 F (36.8 C) (05/04 0513) Pulse Rate:  [90-100] 100 (05/04 0513) Resp:  [15-20] 15 (05/04 0513) BP: (100-130)/(47-69) 105/60 (05/04 0513) SpO2:  [98 %-100 %] 100 % (05/04 0513)  Physical Exam: General: female cachectic appearing stated age in no acute distress, lying in bed HEENT: MMM Cardio: Normal S1 and S2, no S3 or S4. Rhythm is regular. No murmurs or rubs.  Bilateral radial pulses palpable Pulm: Clear to auscultation bilaterally, no crackles, wheezing, or diminished breath sounds. Normal respiratory effort, stable on room air Abdomen: Bowel sounds normal. Abdomen soft and non-tender. Extremities: Trace peripheral edema in bilateral lower extremities Neuro: pt alert and oriented x4   Laboratory: Recent Labs  Lab 01/15/20 0550 01/19/20 0441 01/19/20 1812  WBC 5.2 5.0  --   HGB 7.0* 6.8* 8.5*  HCT 23.1* 22.2* 27.3*  PLT 317 278  --    Recent Labs  Lab 01/15/20 0550 01/17/20 0418 01/19/20 0441  NA 136 138 138  K 4.1 3.6 3.8  CL 104 108 107  CO2 28 25 26   BUN 23 25* 24*  CREATININE 0.47 0.48 0.46  CALCIUM 8.4* 8.2* 8.4*  PROT 6.2*  --  6.2*  BILITOT 0.6  --  0.2*  ALKPHOS 61  --  58  ALT 13  --  13  AST 15  --  15  GLUCOSE  101* 103* 98    Imaging/Diagnostic Tests: No results found.   Stark Klein, MD 01/20/2020, 9:53 AM PGY-2, [place] Intern pager: [phone], text pages welcome

## 2020-01-20 NOTE — Progress Notes (Signed)
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  Ongoing  GOAL:   Patient will meet greater than or equal to 90% of their needs  Met with TPN, meals, and supplements  MONITOR:   PO intake, Supplement acceptance, Diet advancement, Labs, Weight trends, Skin, I & O's  REASON FOR ASSESSMENT:   Consult Assessment of nutrition requirement/status  ASSESSMENT:   Deanna Cunningham is a 74 y.o. female presenting with weakness. PMH is significant for Rheumatoid Arthritis (no tx, no rheumatologist), chronic anemia and GERD.  4/2- PICC placed 4/5- TPN initiated 4/10- TPN d/c 4/17- TPN restarted due to nausea, vomiting, poor oral intake 4/20- transitioned to cyclic TPN  Reviewed I/O's: -453 ml x 24 hours and -28.7 L since 01/06/20  UOP: 1.6 L x 24 hours   Per MD notes, plan to continue to TPN until albumin improves. Once albumin has improved, plan to proceed with EGD with dilation with GI. Pt may also require PEG tube in the future if dilation is not successful.  Pt continues to consume mainly liquids (applesauce, cream soups, broth, juices and soft drinks).She continues to consume [employer] and Prostat supplements, which provide 1050 kcals and 72 grams protein daily. Reviewed meal completion records over the past week hours- pt consuming 798 kcals and 14 grams of protein on  average via meal trays daily.  Pt remains on cyclic TPN over 12 hours (33 mL/hr x 1 hr, 65 mL/hr x 10hr, then 33 mL/hr x 1 hr), which provides 859 kcals and 32 grams protein. Pt receiving approximately 2707 kcals and 118 grams of protein via TPN, meals, and supplements, meeting >100% of estimated needs.   Wt has been stable since admission.  Plan for SNF placement at discharge.   Labs reviewed.   Diet Order:   Diet Order            DIET - DYS 1 Room service appropriate? Yes; Fluid consistency: Thin  Diet effective now              EDUCATION NEEDS:   Education needs have been addressed  Skin:  Skin Assessment: Skin Integrity Issues: Skin Integrity Issues:: Stage II Stage II: coccyx- epithelialized Other: MASD to groin, labia, buttocks, and sacrum  Last BM:  01/17/20  Height:   Ht Readings from Last 1 Encounters:  01/17/20 5' 4"  (1.626 m)    Weight:   Wt Readings from Last 1 Encounters:  01/17/20 42.2 kg    Ideal Body Weight:  54.5 kg  BMI:  Body mass index is 15.97 kg/m.  Estimated Nutritional Needs:   Kcal:  1601-0932  Protein:  90-105 grams  Fluid:  > 1.7 L    [name], RD, LDN, [place] Registered Dietitian II Certified Diabetes Care and Education Specialist Please refer to [facility] for RD and/or RD on-call/weekend/after hours pager

## 2020-01-20 NOTE — Progress Notes (Signed)
Physical Therapy Treatment Patient Details Name: Deanna Cunningham MRN: [id number] DOB: [birth year] Today's Date: 01/20/2020    History of Present Illness Pt is a 74 y.o. female admitted 12/12/19 with progressive weakness, poor PO intake and BLE swelling; worked up for malnutrition (concern for malignancy). Admitted with severe protein calorie malnutrition In the setting of known esophageal dysmotility. Pt also with anemia, electrolyte imbalance, stage 1 sacral wound. Pt with recurrent asymptomatic SVT; may occur in the settings of severe malnutrition and electrolyte imbalance. PMH includes dysphagia, CKD III, anemia, RA.    PT Comments    Patient progressing well towards PT goals. Improved ambulation distance with Min guard assist for balance/safety. Tolerated there ex of UE/LE with orange theraband. Pt performed 5xSTS in 39.9 seconds (norm for her age is 12.6 sec) indicating decreased functional strength and higher risk for falls. Encouraged walking 2 times daily with nursing. Pt hopeful to improve strength enough to return home. Will follow.    Follow Up Recommendations  SNF;Supervision/Assistance - 24 hour(with potential to dc home)     Equipment Recommendations  Other (comment)(defer to post acute)    Recommendations for Other Services       Precautions / Restrictions Precautions Precautions: Fall;Other (comment) Precaution Comments: Bowel incontinence, bilateral hand/finger RA, sacral wound, rectal prolapse (occurred 4/16) Restrictions Weight Bearing Restrictions: No    Mobility  Bed Mobility Overal bed mobility: Modified Independent Bed Mobility: Supine to Sit     Supine to sit: Modified independent (Device/Increase time);HOB elevated     General bed mobility comments: HOB elevated, increased time and effort. No physical assist or cueing required.  Transfers Overall transfer level: Needs assistance Equipment used: Rolling walker (2 wheeled) Transfers: Sit to/from  Stand Sit to Stand: Min guard         General transfer comment: min guard for safety. Cues for safe hand placement. Stood from [employer], from chair x5 for 5xSTS test.  Ambulation/Gait Ambulation/Gait assistance: Min guard Gait Distance (Feet): 150 Feet Assistive device: Rolling walker (2 wheeled) Gait Pattern/deviations: Step-through pattern;Decreased stride length;Narrow base of support Gait velocity: Decreased Gait velocity interpretation: <1.31 ft/sec, indicative of household ambulator General Gait Details: Slow, mostly steady gait with narrow BoS with some instances of scissoring (pt with decreased sensation bil feet).   Stairs             Wheelchair Mobility    Modified Rankin (Stroke Patients Only)       Balance Overall balance assessment: Needs assistance Sitting-balance support: Feet supported;No upper extremity supported Sitting balance-Leahy Scale: Good     Standing balance support: During functional activity Standing balance-Leahy Scale: Fair Standing balance comment: reliant on single UE support in standing                            Cognition Arousal/Alertness: Awake/alert Behavior During Therapy: WFL for tasks assessed/performed Overall Cognitive Status: Within Functional Limits for tasks assessed                                        Exercises General Exercises - Upper Extremity Elbow Flexion: Strengthening;Both;10 reps;Theraband Theraband Level (Elbow Flexion): Other (comment)(orange) Elbow Extension: Strengthening;10 reps;Theraband Theraband Level (Elbow Extension): Other (comment)(orange) General Exercises - Lower Extremity Quad Sets: Strengthening;Both;10 reps;Supine Hip ABduction/ADduction: Both;10 reps;Seated;Strengthening(resisted with orange theraband) Straight Leg Raises: Both;10 reps;Seated;Strengthening Shoulder Exercises Shoulder External Rotation: Strengthening;Both;10  reps;Theraband Theraband Level  (Shoulder External Rotation): Other (comment)(orange)    General Comments General comments (skin integrity, edema, etc.): Pt performed 5xSTS in 39.9 seconds (norm for her age is 12.6 sec) indicating decreased functional strength and higher risk for falls.      Pertinent Vitals/Pain Pain Assessment: No/denies pain    Home Living                      Prior Function            PT Goals (current goals can now be found in the care plan section) Progress towards PT goals: Progressing toward goals    Frequency    Min 2X/week      PT Plan Current plan remains appropriate    Co-evaluation              AM-PAC PT "6 Clicks" Mobility   Outcome Measure  Help needed turning from your back to your side while in a flat bed without using bedrails?: None Help needed moving from lying on your back to sitting on the side of a flat bed without using bedrails?: None Help needed moving to and from a bed to a chair (including a wheelchair)?: A Little Help needed standing up from a chair using your arms (e.g., wheelchair or bedside chair)?: A Little Help needed to walk in hospital room?: A Little Help needed climbing 3-5 steps with a railing? : A Little 6 Click Score: 20    End of Session Equipment Utilized During Treatment: Gait belt Activity Tolerance: Patient tolerated treatment well Patient left: in chair;with call bell/phone within reach(chair alarm malfunctioning) Nurse Communication: Mobility status PT Visit Diagnosis: Other abnormalities of gait and mobility (R26.89);Muscle weakness (generalized) (M62.81);Difficulty in walking, not elsewhere classified (R26.2)     Time: [id number] PT Time Calculation (min) (ACUTE ONLY): 26 min  Charges:  $Gait Training: 8-22 mins $Therapeutic Exercise: 8-22 mins                     Marisa Severin, PT, DPT Acute Rehabilitation Services Pager [phone] Office [place] 01/20/2020, 12:57 PM

## 2020-01-20 NOTE — Progress Notes (Signed)
PHARMACY - TOTAL PARENTERAL NUTRITION CONSULT NOTE   Indication: malnutrition  Patient Measurements: Height: 5\' 4"  (162.6 cm) Weight: 42.2 kg (93 lb 0.6 oz) IBW/kg (Calculated) : 54.7 TPN AdjBW (KG): 42.2 Body mass index is 15.97 kg/m.  Assessment: 74 yo female initially presented on 3/26 for weakness. Patient with PMH of RA, chronic anemia and GERD. She has had ongoing poor intake since Dec 2020 with LE swelling and unintentional weight loss. Pt previously on TPN from 4/5>4/10 which was stopped since pt had adequate oral intake. However, oral intake has been inconsistent so TPN to be restarted.  4/19 - per FMTS patient will remain on TPN until albumin > 2.5 and is able to have EGD with GI   Glucose / Insulin: CBGs well controlled, SSI dc'd Electrolytes: wnl Renal: wnl LFTs / TGs: Trigs 55, LFTs WNL Prealbumin / albumin: Prealbumin 9.2>11.1>17.8>19.1, Albumin 1.4>1.7>1.9>1.9 Intake / Output; MIVF: UOP 0.8 ml/kg/hr, LBM 5/1 x 1 GI Imaging: 3/31 - barium swallow - unable to pass tablet - study shows esophageal dysmotility, dilation, and narrowing  4/1 CT abd pelvis - anasarca and ascites; likely underlying liver disease  Surgeries / Procedures: see above GI recommends EGD with dilation but pt needs improved nutritional status   Central access: PICC 4/1 TPN start date: 12/22/2019>>4/10; 4/16>>  Nutritional Goals (per RD recommendation on 4/28): kCal: 1750-1950 per day, Protein: 90-105 g/day, Fluid: > 1.7 L  Current Nutrition:  TPN Dysphagia 1 diet - variable, eating 0-100% of meals Prostat TID - 100 cal, 15 g protein, 10 g carbs (each) x 3 yesterday Boost BreezeTID - taking consistently, each supplement provides 250kcal and 9g protein x 3 yesterday  Plan:  Continue current TPN with a cycle over 12 hours - 33 mL/hr x 1 hr, 65 mL/hr x 10 hr, then 33 mL/hr x 1 hr - provides 32g protein and 859kcal per day Oral nutritional supplements are providing 1050 kcal/day and 72g protein/day,  meeting ~75% of protein needs Oral supplements and TPN meet 100% of needs, pt is also taking food as able so she is meeting >100% of nutritional needs  TPN Lytes: continue increased Na; will increase K Monitor TPN labs Mon/Thurs - dc tpn if alb > 2?  Thank you for involving pharmacy in this patient's care.  Richardine Service, PharmD PGY1 Pharmacy Resident Phone: [phone] 01/20/2020  7:16 AM  Please check [email or web address] for unit-specific pharmacy phone numbers.

## 2020-01-20 NOTE — Discharge Summary (Signed)
[facility] Discharge Summary  Patient name: Deanna Cunningham Medical record number: [id number] Date of birth: [birth year] Age: 74 y.o. Gender: female Date of Admission: 12/12/2019  Date of Discharge: 02/10/2020 Admitting Physician: Martyn Malay, MD  Primary Care Provider: Kristie Cowman, MD Consultants: GI  Indication for Hospitalization: weakness   Discharge Diagnoses/Problem List:  Principal Problem:   Dysphagia Active Problems:   Chronic anemia   Weakness   Esophageal dysmotility   Weight loss   Pressure injury of coccygeal region, stage 2 ([place])   Protein-calorie malnutrition, severe   Hypoalbuminemia   Esophageal stricture   Rheumatoid arthritis ([place]) Tinea versicolor  Disposition:  SNF  Discharge Condition: stable, improved   Discharge Exam: Medically stable for discharge  Brief Hospital Course:  Deanna Cunningham is a 74 y.o. female who presented with weakness. PMH is significant for Rheumatoid Arthritis (no tx, no rheumatologist), chronic anemia and GERD.  Her hospital course is outlined below.   Severe protein calorie malnutrition Patient admitted for generalized weakness and poor po intake.  Admission details can be found in H&P.  Chest x-ray with only atelectasis of left lung.  Patient had a right upper quadrant ultrasound that was unrevealing.  Dietitian was consulted and perform calorie count.  Patient was not having adequate p.o. intake.  Family requested that F PTS reach out to her outpatient GI physician, Dr. Collene Mares, who was consulted.  Dr. Collene Mares had performed extensive outpatient work-up of patient's poor p.o. intake. Esophagram was performed on 3/31 that showed esophageal dysmotility, dilatation and persistent smooth narrowing of thedistal esophageal lumen. Patient was started on a FLD with a plan for repeat EKG when albumin increased to 2.5. She intermittently failed her calorie goal. 4/2 PICC placed. 4/5 TPN initiated. 4/10 TPN was  discontinue d/t patient meeting her calorie goal. 4/17 TPN restarted due to nausea, vomiting, poor oral intake. 4/19 Surgery consulted and felt as if endoscopic procedure will be considerably safer than surgery in light of her significant protein calorie malnutrition and recommended PEG by GI. 4/20- transitioned to cyclic TPN. Albumin reached 2.5 on 5/13 and 2.6 on 5/16.  Patient's TPN was stopped on 5/14.  TPN was resumed Monday Wednesday Friday from 5/17 which patient should continue for the first week after discharge to SNF and thereafter wean. Patient underwent EGD with Dr. Benson Norway with dilation on 5/19, recommends repeat dilation and EGD two weeks after first (approx 6/1).  Metabolic derangements Patient had various metabolic derangements including hypokalemia, hypomagnesemia, hypophosphatemia.  These were likely in the setting of refeeding syndrome as patient had increase her p.o. intake during the hospitalization. Electrolytes were very stable throughout rest of admission, however likely spurious hypokalemia on 5/24 as repeat stable on 5/25 at 4.2 and no GI losses. Recommend BMP in 1 week after discharge to ensure electrolyte stability.   Weakness Patient initially endorsed lower extremity weakness worse than upper extremity weakness on admission.  She had a known history of lumbar spondylosis with no significant change in imaging from February to March.  Given her report however imaging was performed of the cervical and thoracic spine as well.  This showed multilevel cervical disc degeneration with mild spinal stenosis at C4-C5 and C5-C6 with no cord compression.  Moderate neuroforaminal stenosis on the right at C4-C5 and on the left at C5-C6 and mild thoracic spondylosis without stenosis.  None of these findings were consistent with her complaints.  Her spinal surgeon Dr. Lynann Bologna was messaged through his office, but  he was on vacation during the week of her hospitalization.  Recommend that she follow-up  with him as an outpatient.   Oral candidiasis Patient found to have oral candidiasis on admission.  She was started on nystatin swish and swallow on 3/29.  Repeat EGD on 5/19 showed esophageal stricture which was dilated and esophageal candidiasis.  Patient was started on Diflucan IV and GI recommended repeat dilatation in 2 weeks time.   Anemia Patient noted to have significant anemia throughout her admission.  On 3/31, hemoglobin fell to 6.8, she was given 1 unit of PRBCs It was noted that previously patient had had work-up for possible myelodysplastic syndrome, but had declined bone marrow biopsy.  Will refer to hematology/oncology on discharge. Patient received an additional unit of pRBCs for hemoglobin of 6.8 on 01/19/20 and hemoglobin improved to 8.5. CBCs were monitored weekly during this admission.   Pressure sores on heels Patient noticed pressure sores had developed on her heels during the weekend of 5/22-5/23, R>L with dry eschar. Wounds were dressed and cleaned by wound care consult and padded boots to prevent pressure sores given. Recommend follow up with outpatient wound care with daily dressing changes and preventative measures such as regularly changing position.   Issues for Follow Up:   1. One episode of hyperkalemia prior to discharge. Likely lab error. Her kidney function is normal. K maintained through entire hospitalization. Her K was normal the next day. Please check BMP o/p  x 1  2. Pressure sores on heels: ensure regular dressing changes, position changes, and follow up with outpatient wound care for resolution 3. Patient was referred to Rheumatology on discharge given untreated rheumatoid arthritis. 4. Patient was referred to hematology/oncology on discharge given her profound anemia and history of possible myelodysplasia. 5. Recommend follow up with cardiology and spinal surgery as outpatient. 6. Recommend close follow-up with GI, repeat endoscopy in 2 weeks after discharge  for dilatation of esophagus 7. Tinea versicolor under L breast, treat with clotrimazole cr 1% BID x2 weeks, ensure area is clear after treatment. 8. Patient concerned that L breast larger than R. Last mammogram 05/2018 WNL, due for last one this year. Normal exam, recommend repeat mammogram this year for reassurance.  Significant Procedures: EGD 5/19   Patient received blood transfusions  12/17/19: 1 unit for hemoglobin of 6.8  01/19/20: 1 unit, hemoglobin 6.8 >8.5  Significant Labs and Imaging:  Recent Labs  Lab 02/09/20 0400  WBC 5.1  HGB 7.9*  HCT 26.0*  PLT 202   Recent Labs  Lab 02/05/20 0534 02/05/20 0534 02/09/20 0400 02/09/20 0400 02/09/20 1856 02/10/20 0450  NA 135  --  138  --  135 135  K 4.3   < > 2.7*   < > 3.5 4.2  CL 106  --  104  --  103 107  CO2 24  --  24  --  23 23  GLUCOSE 103*  --  96  --  105* 105*  BUN 41*  --  32*  --  34* 47*  CREATININE 0.59  --  0.58  --  0.59 0.52  CALCIUM 8.9  --  9.6  --  9.0 9.1  MG 1.7  --  1.6*  --   --  2.1  PHOS 4.2  --  3.9  --   --  3.1  ALKPHOS 53  --  56  --   --  61  AST 13*  --  13*  --   --  14*  ALT 12  --  12  --   --  11  ALBUMIN 2.5*  --  2.6*  --   --  2.6*   < > = values in this interval not displayed.   MR CERVICAL SPINE WO CONTRAST  Result Date: 12/13/2019 CLINICAL DATA:  Limb weakness. EXAM: MRI CERVICAL AND THORACIC SPINE WITHOUT CONTRAST TECHNIQUE: Multiplanar and multiecho pulse sequences of the cervical spine, to include the craniocervical junction and cervicothoracic junction, and the thoracic spine, were obtained without intravenous contrast. COMPARISON:  None. FINDINGS: MRI CERVICAL SPINE FINDINGS Alignment: Trace retrolisthesis of C4 on C5. Vertebrae: Heterogeneous bone marrow signal diffusely including diminished T1 signal intensity, nonspecific but may be related to the patient's chronic anemia. No fracture or destructive osseous lesion identified. Multilevel chronic degenerative endplate changes.  Cord: Normal signal and morphology. 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 Preserved vertebral artery flow voids. Disc levels: Disc space narrowing from C3-4 to C6-7, most severe at C3-4. C2-3: Negative. C3-4: Disc bulging and mild uncovertebral spurring result in mild right neural foraminal stenosis without significant spinal stenosis. C4-5: Disc bulging and uncovertebral spurring result in mild spinal stenosis and moderate right neural foraminal stenosis. C5-6: Broad left paracentral disc osteophyte complex and uncovertebral spurring result in mild spinal stenosis and moderate left neural foraminal stenosis. C6-7: Disc bulging and left uncovertebral spurring result in mild left neural foraminal stenosis without significant spinal stenosis. C7-T1: Negative. MRI THORACIC SPINE FINDINGS Alignment:  Normal. Vertebrae: Diffuse bone marrow heterogeneity as described above. No fracture or destructive osseous lesion identified. Cord:  Normal signal and morphology. Paraspinal and other soft tissues: Patulous thoracic esophagus containing small volume fluid. Disc levels: Mild thoracic spondylosis with small central disc protrusions at T8-9, T9-10, and T12-L1. No spinal cord mass effect or stenosis. IMPRESSION: 1. Multilevel cervical disc degeneration with with mild spinal stenosis at C4-5 and C5-6. No cord compression. 2. Moderate neural foraminal stenosis on the right at C4-5 and on the left at C5-6. 3. Mild thoracic spondylosis without stenosis. Electronically Signed   By: Logan Bores M.D.   On: 12/13/2019 18:54   MR THORACIC SPINE WO CONTRAST  Result Date: 12/13/2019 CLINICAL DATA:  Limb weakness. EXAM: MRI CERVICAL AND THORACIC SPINE WITHOUT CONTRAST TECHNIQUE: Multiplanar and multiecho pulse sequences of the cervical spine, to include the craniocervical junction and cervicothoracic junction, and the thoracic spine, were obtained without intravenous contrast.  COMPARISON:  None. FINDINGS: MRI CERVICAL SPINE FINDINGS Alignment: Trace retrolisthesis of C4 on C5. Vertebrae: Heterogeneous bone marrow signal diffusely including diminished T1 signal intensity, nonspecific but may be related to the patient's chronic anemia. No fracture or destructive osseous lesion identified. Multilevel chronic degenerative endplate changes. Cord: Normal signal and morphology. 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 Preserved vertebral artery flow voids. Disc levels: Disc space narrowing from C3-4 to C6-7, most severe at C3-4. C2-3: Negative. C3-4: Disc bulging and mild uncovertebral spurring result in mild right neural foraminal stenosis without significant spinal stenosis. C4-5: Disc bulging and uncovertebral spurring result in mild spinal stenosis and moderate right neural foraminal stenosis. C5-6: Broad left paracentral disc osteophyte complex and uncovertebral spurring result in mild spinal stenosis and moderate left neural foraminal stenosis. C6-7: Disc bulging and left uncovertebral spurring result in mild left neural foraminal stenosis without significant spinal stenosis. C7-T1: Negative. MRI THORACIC SPINE FINDINGS Alignment:  Normal. Vertebrae: Diffuse bone marrow heterogeneity as described above. No fracture or  destructive osseous lesion identified. Cord:  Normal signal and morphology. Paraspinal and other soft tissues: Patulous thoracic esophagus containing small volume fluid. Disc levels: Mild thoracic spondylosis with small central disc protrusions at T8-9, T9-10, and T12-L1. No spinal cord mass effect or stenosis. IMPRESSION: 1. Multilevel cervical disc degeneration with with mild spinal stenosis at C4-5 and C5-6. No cord compression. 2. Moderate neural foraminal stenosis on the right at C4-5 and on the left at C5-6. 3. Mild thoracic spondylosis without stenosis. Electronically Signed   By: Logan Bores M.D.   On: 12/13/2019  18:54   MR LUMBAR SPINE WO CONTRAST  Result Date: 12/12/2019 CLINICAL DATA:  Patient reporting new numbness and weakness since 11/08/2019 MRI, L4 distribution numbness, poor rectal tone, poor strength; low back pain, cauda equina syndrome suspected. Additional history provided: Generalized weakness and hypokalemia, patient reports numbness from the waist down for the past month, multiple falls. EXAM: MRI LUMBAR SPINE WITHOUT CONTRAST TECHNIQUE: Multiplanar, multisequence MR imaging of the lumbar spine was performed. No intravenous contrast was administered. COMPARISON:  Lumbar spine MRI 11/10/2019. FINDINGS: Segmentation: For the purposes of this dictation, five lumbar vertebrae are assumed and the caudal most well-formed intervertebral disc is designated L5-S1. Alignment: Lumbar dextrocurvature. Unchanged 4 mm L4-L5 grade 1 anterolisthesis. Vertebrae: Vertebral body height is maintained. Redemonstrated diffusely heterogeneous marrow signal with a mottled appearance. Conus medullaris and cauda equina: Conus extends to the L1-L2 level. No signal abnormality within the visualized distal spinal cord. Paraspinal and other soft tissues: Nonspecific diffuse edema within the lumbar paraspinal soft tissues which may reflect a fluid overloaded state. Colonic diverticulosis. Disc levels: Unless otherwise stated, the level by level findings below have not significantly changed since prior MRI 11/10/2019. Moderate disc degeneration throughout the lumbar spine, greatest at L4-L5. T12-L1: Small central disc protrusion. No significant spinal canal or foraminal stenosis. L1-L2: Disc bulge. Superimposed small right center disc protrusion. No significant spinal canal or foraminal stenosis. L2-L3: Disc bulge. Superimposed left center to left foraminal disc protrusion. Mild facet arthrosis. Unchanged left subarticular stenosis with slight crowding of the descending left L3 nerve root. Mild relative narrowing of the central canal.  Moderate left neural foraminal narrowing. L3-L4: Disc bulge. Superimposed broad-based left center/foraminal disc protrusion. Mild facet arthrosis/ligamentum flavum hypertrophy. Left subarticular stenosis with crowding of the descending left L4 nerve root. Central canal patent. Moderate left neural foraminal narrowing. L4-L5: Grade 1 anterolisthesis. Disc uncovering and disc bulge. Prominent facet arthrosis with ligamentum flavum hypertrophy. Moderate/severe bilateral subarticular stenosis with crowding of the descending L5 nerve roots. Moderate central canal stenosis. Bilateral neural foraminal narrowing (mild right, moderate left). L5-S1: Disc bulge with endplate spurring. Superimposed moderate-sized central disc protrusion at site of posterior annular fissure. Mild facet arthrosis. Bilateral subarticular stenosis with crowding of the descending S1 nerve roots (greater on the left) (series 8, image 29). Moderate central canal stenosis. No significant foraminal narrowing. IMPRESSION: Lumbar spondylosis has not significantly changed as compared MRI 11/10/2019. Findings are most notably as follows. At L4-L5, grade 1 anterolisthesis, disc uncovering and disc bulge, prominent facet arthrosis, ligamentum flavum hypertrophy. Moderate/severe bilateral subarticular stenosis with crowding of the descending L5 nerve roots. Moderate central canal stenosis. Bilateral neural foraminal narrowing (mild right, moderate left). At L5-S1, a broad-based central disc protrusion contributes to multifactorial bilateral subarticular narrowing with crowding of the descending S1 nerve roots (greater on the left). Moderate central canal stenosis. Multifactorial left subarticular narrowing at L2-L3 and L3-L4 with crowding of the descending L3 and L4 nerve roots respectively.  Redemonstrated diffuse heterogeneity of the osseous marrow signal. This finding has a broad range of differential causes, although in this patient may be secondary to  chronic anemia. Electronically Signed   By: Kellie Simmering DO   On: 12/12/2019 16:39   US Abdomen Limited RUQ  Result Date: 12/13/2019 CLINICAL DATA:  Weight loss EXAM: ULTRASOUND ABDOMEN LIMITED RIGHT UPPER QUADRANT COMPARISON:  None. FINDINGS: Gallbladder: No gallstones are noted. The gallbladder is decompressed with gallbladder wall measuring 5.3 mm. This may be secondary to decompression and surrounding ascites. No sonographic Murphy sign noted by sonographer. Common bile duct: Diameter: 2.5 mm Liver: No focal liver lesion is identified. There is diffuse increased echotexture of the liver. Portal vein is patent on color Doppler imaging with normal direction of blood flow towards the liver. Other: Small volume of ascites is identified in the right upper and right lower quadrant. IMPRESSION: No gallstones or sonographic Murphy sign identified. The gallbladder is decompressed with gallbladder wall measuring 5.3 mm which may be secondary to decompression and surrounding ascites. Fatty infiltration of liver. Small volume ascites in the right upper and right lower quadrants. Electronically Signed   By: Abelardo Diesel M.D.   On: 12/13/2019 09:15    Results/Tests Pending at Time of Discharge: none  Discharge Medications:  Allergies as of 02/10/2020      Reactions   Lactose Intolerance (gi)       Medication List    STOP taking these medications   famotidine 10 MG tablet Commonly known as: PEPCID     TAKE these medications   acetaminophen 500 MG tablet Commonly known as: TYLENOL Take 500 mg by mouth every 6 (six) hours as needed for mild pain.   clotrimazole 1 % cream Commonly known as: LOTRIMIN Apply topically 2 (two) times daily.   fluconazole 100 MG tablet Commonly known as: DIFLUCAN Take 1 tablet (100 mg total) by mouth daily for 4 days.   loratadine 10 MG tablet Commonly known as: CLARITIN Take 1 tablet (10 mg total) by mouth daily as needed for allergies.   pantoprazole 40 MG  tablet Commonly known as: PROTONIX Take 1 tablet (40 mg total) by mouth daily.            Durable Medical Equipment  (From admission, onward)         Start     Ordered   12/16/19 0818  For home use only DME Shower stool  Once     12/16/19 0817   12/16/19 0817  For home use only DME 3 n 1  Once     12/16/19 7322          Discharge Instructions: Please refer to Patient Instructions section of EMR for full details.  Patient was counseled important signs and symptoms that should prompt return to medical care, changes in medications, dietary instructions, activity restrictions, and follow up appointments.   Follow-Up Appointments:  Contact information for follow-up providers    Kristie Cowman, MD. Schedule an appointment as soon as possible for a visit in 1 week(s).   Specialty: Family Medicine Contact information: [place] [address] [phone]        Care, [facility] Follow up.   Specialty: Home Health Services Why: home health services arranged Contact information: [place] [address] [phone]            Contact information for after-discharge care    Destination    [facility] .  Service: Skilled Nursing Contact information: [place] [phone]                  Gladys Damme, MD 02/10/2020, 3:25 PM PGY-1, [place]

## 2020-01-21 MED ORDER — TRAVASOL 10 % IV SOLN
INTRAVENOUS | Status: AC
  Filled 2020-01-21: qty 324

## 2020-01-21 NOTE — Progress Notes (Signed)
Family Medicine Teaching Service Daily Progress Note Intern Pager: [phone]  Patient name: Deanna Cunningham Medical record number: [id number] Date of birth: [birth year] Age: 74 y.o. Gender: female  Primary Care Provider: Kristie Cowman, MD Consultants: GI, Gen Surg Code Status: full code  Pt Overview and Major Events to Date:  03/26-Admitted 04/01-PICC inserted   Assessment and Plan:  Deanna Cunningham a 74 y.o.femalewho presented w/ global weakness, weight loss and failure to thrive. PMHx s/f malnutrition, RA (naive to care), chronic anemia, GERD.  Underweight, severe protein caloriemalnutrition In the setting of known esophageal dysmotility, presbyesophagus: Stable Patient reports she is doing well with no complaints this morning. -GI and surgery recommends having albumin ~2-2.5 prior to proceeding with EGD. -TPN per pharmacy -Labs per pharmacy: Every Monday & Thursday: CBC, CMP, mag, phosphorus.    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 - Avoid QT prolonging medication  Normocytic anemia: Chronic, stable  Hemoglobin  improved to 8.5 from 6.8 following transfusion of 1 unit on 5/3.  -CBC Mondays and Thursdays  GERD: Chronic, stable - Continue Pepcid 20 mg twice daily - Continue Protonix 40 mg daily - Continue sucralfate with meals and bedtime - May have GI cocktail at patient's request  Sacral wound: Stable/improved - Routine wound care - Position changes frequently every 2 hours - Encourage out of bed - PT/OT following  Rheumatoid arthritis: Chronic, stable. - Follows with rheumatology outpatient  FEN/GI: - Dysphagia 1 diet - TPN  Prophylaxis: lovenox   Disposition: Patient stable on TPN, awaiting  SNF acceptance   Subjective:  Patient reports no new complaints.  Denies any nausea.  Objective: Temp:  [98.1 F (36.7 C)-98.7 F (37.1 C)] 98.1 F (36.7 C) (05/05 0803) Pulse Rate:  [59-98] 87 (05/05 0803) Resp:  [15-17] 17 (05/05 0803) BP: (106-118)/(54-72) 106/54 (05/05 0803) SpO2:  [96 %-100 %] 100 % (05/05 0803)  Physical Exam: General: female appearing stated age, cachetic,  in no acute distress sitting up in bed Cardio: Normal S1 and S2, no S3 or S4. Rhythm is regular. No murmurs or rubs.  Bilateral radial pulses palpable Pulm: Clear to auscultation bilaterally, some crackles at bilateral bases, wheezing, or diminished breath sounds. Normal respiratory effort, stable on RA Abdomen: Bowel sounds normal. Abdomen soft and non-tender.  Extremities: No peripheral edema. Warm/ well perfused. Trace LE edema more so on right LE  Neuro: pt alert and oriented x4    Laboratory: Recent Labs  Lab 01/15/20 0550 01/19/20 0441 01/19/20 1812  WBC 5.2 5.0  --   HGB 7.0* 6.8* 8.5*  HCT 23.1* 22.2* 27.3*  PLT 317 278  --    Recent Labs  Lab 01/15/20 0550 01/17/20 0418 01/19/20 0441  NA 136 138 138  K 4.1 3.6 3.8  CL 104 108 107  CO2 28 25 26   BUN 23 25* 24*  CREATININE 0.47 0.48 0.46  CALCIUM 8.4* 8.2* 8.4*  PROT 6.2*  --  6.2*  BILITOT 0.6  --  0.2*  ALKPHOS 61  --  58  ALT 13  --  13  AST 15  --  15  GLUCOSE 101* 103* 98    Imaging/Diagnostic Tests: No results found.   Stark Klein, MD 01/21/2020, 11:34 AM PGY-2, [place]  [place] Intern pager: [phone], text pages welcome

## 2020-01-21 NOTE — Progress Notes (Signed)
Occupational Therapy Treatment Patient Details Name: Deanna Cunningham MRN: [id number] DOB: 1946/04/05 Today's Date: 01/21/2020    History of present illness Pt is a 74 y.o. female admitted 12/12/19 with progressive weakness, poor PO intake and BLE swelling; worked up for malnutrition (concern for malignancy). Admitted with severe protein calorie malnutrition In the setting of known esophageal dysmotility. Pt also with anemia, electrolyte imbalance, stage 1 sacral wound. Pt with recurrent asymptomatic SVT; may occur in the settings of severe malnutrition and electrolyte imbalance. PMH includes dysphagia, CKD III, anemia, RA.   OT comments  Pt awake and agreeable to OT session this date. She required minguard for functional mobility at RW level. She had BM on commode and required minguard for pericare. Pt completed grooming at sink level with minguard for safety. She demonstrated good carry over of UE HEP with theraband and theraputty. Educated pt on importance of maximizing independence with self care and ADL with staff. Pt will continue to benefit from skilled OT services to maximize safety and independence with ADL/IADL and functional mobility. Will continue to follow acutely and progress as tolerated.    Follow Up Recommendations  SNF    Equipment Recommendations  3 in 1 bedside commode;Tub/shower bench    Recommendations for Other Services      Precautions / Restrictions Precautions Precautions: Fall;Other (comment) Precaution Comments: Bowel incontinence, bilateral hand/finger RA, sacral wound, rectal prolapse (occurred 4/16) Restrictions Weight Bearing Restrictions: No       Mobility Bed Mobility Overal bed mobility: Modified Independent Bed Mobility: Supine to Sit           General bed mobility comments: with bed completely flat  Transfers Overall transfer level: Needs assistance Equipment used: Rolling walker (2 wheeled) Transfers: Sit to/from Stand Sit to Stand: Min  guard         General transfer comment: minguard for safety;pt with increased time and effort to progress into standing, required forwared trunk flexion, no loss of balance noted    Balance Overall balance assessment: Needs assistance Sitting-balance support: Feet supported;No upper extremity supported Sitting balance-Leahy Scale: Good Sitting balance - Comments: Can reach bilateral feet to fix socks    Standing balance support: Single extremity supported;During functional activity Standing balance-Leahy Scale: Fair Standing balance comment: reliant on single UE support in standing                           ADL either performed or assessed with clinical judgement   ADL Overall ADL's : Needs assistance/impaired Eating/Feeding: Set up Eating/Feeding Details (indicate cue type and reason): pt finishing breakfast upon arrival Grooming: Min guard;Wash/dry face;Oral care;Standing Grooming Details (indicate cue type and reason): at sink level                 Toilet Transfer: Min guard;Ambulation Toilet Transfer Details (indicate cue type and reason): into bathroom, pt had BM and urinated Toileting- Clothing Manipulation and Hygiene: Minimal assistance;Sit to/from stand       Functional mobility during ADLs: Min guard;Rolling walker General ADL Comments: edcuated pt on importance of her independently participating in ADL with staff while in hospital to maximize progression toward prior level of functioning     Vision       Perception     Praxis      Cognition Arousal/Alertness: Awake/alert Behavior During Therapy: WFL for tasks assessed/performed Overall Cognitive Status: Within Functional Limits for tasks assessed  Exercises Shoulder Exercises Shoulder External Rotation: Strengthening;Both;10 reps;Theraband Theraband Level (Shoulder External Rotation): Other (comment)(orange) Other  Exercises Other Exercises: continued to review UE HEP, pt reports completing almost every day Other Exercises: performed 10 shoulder flexion holds Other Exercises: pt demonstrated exercises with use of super soft theraputty   Shoulder Instructions       General Comments vss    Pertinent Vitals/ Pain       Pain Assessment: No/denies pain Pain Intervention(s): Monitored during session  Home Living                                          Prior Functioning/Environment              Frequency  Min 2X/week        Progress Toward Goals  OT Goals(current goals can now be found in the care plan section)  Progress towards OT goals: Progressing toward goals  Acute Rehab OT Goals Patient Stated Goal: agreeable to SNF, wants to continue to get stronger OT Goal Formulation: With patient Time For Goal Achievement: 01/28/20 Potential to Achieve Goals: Good ADL Goals Pt Will Perform Grooming: with modified independence;standing Pt Will Perform Upper Body Bathing: with modified independence;standing;sitting Pt Will Perform Lower Body Bathing: with modified independence;sit to/from stand Pt Will Perform Upper Body Dressing: with modified independence;sitting Pt Will Perform Lower Body Dressing: with modified independence;sit to/from stand Pt Will Transfer to Toilet: with modified independence;ambulating;regular height toilet Pt Will Perform Toileting - Clothing Manipulation and hygiene: with modified independence;sit to/from stand Pt/caregiver will Perform Home Exercise Program: Increased strength;Right Upper extremity;Left upper extremity;With Supervision;With written HEP provided  Plan Discharge plan remains appropriate    Co-evaluation                 AM-PAC OT "6 Clicks" Daily Activity     Outcome Measure   Help from another person eating meals?: A Little Help from another person taking care of personal grooming?: A Little Help from another person  toileting, which includes using toliet, bedpan, or urinal?: A Little Help from another person bathing (including washing, rinsing, drying)?: A Little Help from another person to put on and taking off regular upper body clothing?: A Little Help from another person to put on and taking off regular lower body clothing?: A Little 6 Click Score: 18    End of Session Equipment Utilized During Treatment: Rolling walker;Gait belt  OT Visit Diagnosis: Unsteadiness on feet (R26.81);Repeated falls (R29.6);Muscle weakness (generalized) (M62.81) Pain - Right/Left: Right   Activity Tolerance Patient tolerated treatment well   Patient Left in chair;with chair alarm set;with call bell/phone within reach   Nurse Communication Mobility status        Time: [id number] OT Time Calculation (min): 41 min  Charges: OT General Charges $OT Visit: 1 Visit OT Treatments $Self Care/Home Management : 38-52 mins  Helene Kelp OTR/L Acute Rehabilitation Services Office: [place] 01/21/2020, 12:30 PM

## 2020-01-21 NOTE — Progress Notes (Signed)
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  Ongoing  GOAL:   Patient will meet greater than or equal to 90% of their needs  Met with TPN, meals, and supplements  MONITOR:   PO intake, Supplement acceptance, Diet advancement, Labs, Weight trends, Skin, I & O's  REASON FOR ASSESSMENT:   Consult Assessment of nutrition requirement/status  ASSESSMENT:   Deanna Cunningham is a 74 y.o. female presenting with weakness. PMH is significant for Rheumatoid Arthritis (no tx, no rheumatologist), chronic anemia and GERD.  4/2- PICC placed 4/5- TPN initiated 4/10- TPN d/c 4/17- TPN restarted due to nausea, vomiting, poor oral intake 4/20- transitioned to cyclic TPN  Reviewed I/O's: -1.7 L x 24 hours and -27.5 L since 01/07/20  UOP: 3.6 L x 24 hours  Per MD notes, plan to continue to TPN until albumin improves. Once albumin has improved, plan to proceed with EGD with dilation with GI. Pt may also require PEG tube in the future if dilation is not successful.  Spoke with pt at bedside, who reports she continues to feel better and stronger. Pt continues to consume mainly liquids (applesauce, cream soups, broth, juices and soft drinks).She continues to consume [employer] and Prostat supplements, which provide 1050 kcals and 72 grams protein daily.Reviewed meal completion records over the  past week hours- pt consuming 798 kcals and 14 grams of protein on average via meal trays daily.  Pt remains on cyclic TPN over 12 hours (33 mL/hr x 1 hr, 65 mL/hr x 10hr, then 33 mL/hr x 1 hr),which provides 859 kcals and 32 grams protein. Pt receiving approximately2707kcals and 118grams of protein via TPN, meals, and supplements, meeting >100% of estimated needs.  Wt has been stable since admission.  Plan for SNF placement at discharge.  Labs reviewed.   Diet Order:   Diet Order            DIET - DYS 1 Room service appropriate? Yes; Fluid consistency: Thin  Diet effective now              EDUCATION NEEDS:   Education needs have been addressed  Skin:  Skin Assessment: Skin Integrity Issues: Skin Integrity Issues:: Stage II Stage II: coccyx- epithelialized Other: MASD to groin, labia, buttocks, and sacrum  Last BM:  01/19/20  Height:   Ht Readings from Last 1 Encounters:  01/17/20 _0  (1.626 m)    Weight:   Wt Readings from Last 1 Encounters:  01/17/20 42.2 kg    Ideal Body Weight:  54.5 kg  BMI:  Body mass index is 15.97 kg/m.  Estimated Nutritional Needs:   Kcal:  8003-4917  Protein:  90-105 grams  Fluid:  > 1.7 L    [name], RD, LDN, [place] Registered Dietitian II Certified Diabetes Care and Education Specialist Please refer to [facility] for RD and/or RD on-call/weekend/after hours pager

## 2020-01-21 NOTE — Progress Notes (Signed)
PHARMACY - TOTAL PARENTERAL NUTRITION CONSULT NOTE   Indication: malnutrition  Patient Measurements: Height: 5\' 4"  (162.6 cm) Weight: 42.2 kg (93 lb 0.6 oz) IBW/kg (Calculated) : 54.7 TPN AdjBW (KG): 42.2 Body mass index is 15.97 kg/m.  Assessment: 74 yo female initially presented on 3/26 for weakness. Patient with PMH of RA, chronic anemia and GERD. She has had ongoing poor intake since Dec 2020 with LE swelling and unintentional weight loss. Pt previously on TPN from 4/5>4/10 which was stopped since pt had adequate oral intake. However, oral intake has been inconsistent so TPN to be restarted.  4/19 - per FMTS patient will remain on TPN until albumin > 2.5 and is able to have EGD with GI   Glucose / Insulin: CBGs well controlled, SSI dc'd Electrolytes: wnl Renal: wnl LFTs / TGs: Trigs 55, LFTs WNL Prealbumin / albumin: Prealbumin 9.2>11.1>17.8>19.1, Albumin 1.4>1.7>1.9>1.9 Intake / Output; MIVF: UOP 3.5 ml/kg/hr, LBM 5/1 x 1 GI Imaging: 3/31 - barium swallow - unable to pass tablet - study shows esophageal dysmotility, dilation, and narrowing  4/1 CT abd pelvis - anasarca and ascites; likely underlying liver disease  Surgeries / Procedures: see above GI recommends EGD with dilation but pt needs improved nutritional status   Central access: PICC 4/1 TPN start date: 12/22/2019>>4/10; 4/16>>  Nutritional Goals (per RD recommendation on 4/28): kCal: 1750-1950 per day, Protein: 90-105 g/day, Fluid: > 1.7 L  Current Nutrition:  TPN Dysphagia 1 diet - variable, eating 0-100% of meals Prostat TID - 100 cal, 15 g protein, 10 g carbs (each) x 3 yesterday Boost BreezeTID - taking consistently, each supplement provides 250kcal and 9g protein x 3 yesterday  Plan:  Continue current TPN with a cycle over 12 hours - 33 mL/hr x 1 hr, 65 mL/hr x 10 hr, then 33 mL/hr x 1 hr - provides 32g protein and 859kcal per day Oral nutritional supplements are providing 1050 kcal/day and 72g protein/day,  meeting ~75% of protein needs Oral supplements and TPN meet 100% of needs, pt is also taking food as able so she is meeting >100% of nutritional needs  TPN Lytes: continue increased Na; will increase K Monitor TPN labs Mon/Thurs - dc tpn if alb > 2?  Thank you for involving pharmacy in this patient's care.  Richardine Service, PharmD PGY1 Pharmacy Resident Phone: [phone] 01/21/2020  7:07 AM  Please check [email or web address] for unit-specific pharmacy phone numbers.

## 2020-01-22 LAB — COMPREHENSIVE METABOLIC PANEL
ALT: 11 U/L (ref 0–44)
AST: 13 U/L — ABNORMAL LOW (ref 15–41)
Albumin: 2 g/dL — ABNORMAL LOW (ref 3.5–5.0)
Alkaline Phosphatase: 59 U/L (ref 38–126)
Anion gap: 3 — ABNORMAL LOW (ref 5–15)
BUN: 27 mg/dL — ABNORMAL HIGH (ref 8–23)
CO2: 26 mmol/L (ref 22–32)
Calcium: 8.3 mg/dL — ABNORMAL LOW (ref 8.9–10.3)
Chloride: 108 mmol/L (ref 98–111)
Creatinine, Ser: 0.52 mg/dL (ref 0.44–1.00)
GFR calc Af Amer: >60 mL/min (ref >60)
GFR calc non Af Amer: >60 mL/min (ref >60)
Glucose, Bld: 104 mg/dL — ABNORMAL HIGH (ref 70–99)
Potassium: 3.9 mmol/L (ref 3.5–5.1)
Sodium: 137 mmol/L (ref 135–145)
Total Bilirubin: 0.4 mg/dL (ref 0.3–1.2)
Total Protein: 6 g/dL — ABNORMAL LOW (ref 6.5–8.1)

## 2020-01-22 LAB — PHOSPHORUS: Phosphorus: 4.5 mg/dL (ref 2.5–4.6)

## 2020-01-22 LAB — CBC
HCT: 26.5 % — ABNORMAL LOW (ref 36.0–46.0)
Hemoglobin: 8.4 g/dL — ABNORMAL LOW (ref 12.0–15.0)
MCH: 31.3 pg (ref 26.0–34.0)
MCHC: 31.7 g/dL (ref 30.0–36.0)
MCV: 98.9 fL (ref 80.0–100.0)
Platelets: 241 10*3/uL (ref 150–400)
RBC: 2.68 MIL/uL — ABNORMAL LOW (ref 3.87–5.11)
RDW: 15.5 % (ref 11.5–15.5)
WBC: 5.4 10*3/uL (ref 4.0–10.5)
nRBC: 0 % (ref 0.0–0.2)

## 2020-01-22 LAB — MAGNESIUM: Magnesium: 1.8 mg/dL (ref 1.7–2.4)

## 2020-01-22 MED ORDER — TRAVASOL 10 % IV SOLN
INTRAVENOUS | Status: AC
  Filled 2020-01-22: qty 324

## 2020-01-22 NOTE — Progress Notes (Signed)
Subjective: Feeling well.  No complaints.  Objective: Vital signs in last 24 hours: Temp:  [98.2 F (36.8 C)-98.4 F (36.9 C)] 98.2 F (36.8 C) (05/06 0349) Pulse Rate:  [95-98] 95 (05/06 0349) Resp:  [15-17] 16 (05/06 0349) BP: (103-114)/(54-67) 103/55 (05/06 0349) SpO2:  [99 %-100 %] 99 % (05/06 0349) Last BM Date: 01/20/20  Intake/Output from previous day: 05/05 0701 - 05/06 0700 In: 1423.8 [P.O.:920; I.V.:503.8] Out: 3000 [Urine:3000] Intake/Output this shift: Total I/O In: 240 [P.O.:240] Out: 800 [Urine:800]  General appearance: cachectic GI: soft, non-tender; bowel sounds normal; no masses,  no organomegaly  Lab Results: Recent Labs    01/19/20 1812 01/22/20 0306  WBC  --  5.4  HGB 8.5* 8.4*  HCT 27.3* 26.5*  PLT  --  241   BMET Recent Labs    01/22/20 0306  NA 137  K 3.9  CL 108  CO2 26  GLUCOSE 104*  BUN 27*  CREATININE 0.52  CALCIUM 8.3*   LFT Recent Labs    01/22/20 0306  PROT 6.0*  ALBUMIN 2.0*  AST 13*  ALT 11  ALKPHOS 59  BILITOT 0.4   PT/INR No results for input(s): LABPROT, INR in the last 72 hours. Hepatitis Panel No results for input(s): HEPBSAG, HCVAB, HEPAIGM, HEPBIGM in the last 72 hours. C-Diff No results for input(s): CDIFFTOX in the last 72 hours. Fecal Lactopherrin No results for input(s): FECLLACTOFRN in the last 72 hours.  Studies/Results: No results found.  Medications:  Scheduled: . Chlorhexidine Gluconate Cloth  6 each Topical Daily  . enoxaparin (LOVENOX) injection  30 mg Subcutaneous Q24H  . famotidine  20 mg Oral BID  . feeding supplement  1 Container Oral TID BM  . feeding supplement (PRO-STAT SUGAR FREE 64)  30 mL Oral TID  . mouth rinse  15 mL Mouth Rinse BID  . multivitamin with minerals  1 tablet Oral Daily  . pantoprazole  40 mg Oral Daily  . sodium chloride flush  10-40 mL Intracatheter Q12H  . sucralfate  1 g Oral TID WC & HS  . thiamine  100 mg Oral Daily   Continuous: . TPN CYCLIC-ADULT  (ION)      Assessment/Plan: 1) Severe malnutrition - improving. 2) Dysphagia.   I conferred with Dr. Collene Mares, who is in contact with her brother Dr. Mahala Menghini.  Her current albumin is at 2.0, but her brother is very concerned about Ms. Fidel Levy undergoing any procedures with the current albumin.  He feels that an albumin at 2.5 is much safer to perform an EGD with possible dilation.  I agree that hypoalbuminemia is a risk for complications with surgery and procedures.  It is not unreasonable to continue TPN and perform the EGD when her albumin is at 2.5.  She states that she tried puree, but it caused her issues with swallowing.  Plan: 1) Continue TPN.  Hopefully at an albumin of 2.5 all parties will be agreeable for Ms. Skupien to undergo an EGD with dilation. 2) Please call when albumin reaches 2.5.  LOS: 41 days   Margeart Allender D 01/22/2020, 12:26 PM

## 2020-01-22 NOTE — Plan of Care (Signed)
  Problem: Education: Goal: Knowledge of General Education information will improve Description: Including pain rating scale, medication(s)/side effects and non-pharmacologic comfort measures Outcome: Progressing   Problem: Health Behavior/Discharge Planning: Goal: Ability to manage health-related needs will improve Outcome: Progressing   Problem: Nutrition: Goal: Adequate nutrition will be maintained Outcome: Not Progressing   Problem: Safety: Goal: Ability to remain free from injury will improve Outcome: Progressing   Problem: Skin Integrity: Goal: Risk for impaired skin integrity will decrease Outcome: Progressing

## 2020-01-22 NOTE — Progress Notes (Signed)
PHARMACY - TOTAL PARENTERAL NUTRITION CONSULT NOTE   Indication: malnutrition  Patient Measurements: Height: 5\' 4"  (162.6 cm) Weight: 42.2 kg (93 lb 0.6 oz) IBW/kg (Calculated) : 54.7 TPN AdjBW (KG): 42.2 Body mass index is 15.97 kg/m.  Assessment: 74 yo female initially presented on 3/26 for weakness. Patient with PMH of RA, chronic anemia and GERD. She has had ongoing poor intake since Dec 2020 with LE swelling and unintentional weight loss. Pt previously on TPN from 4/5>4/10 which was stopped since pt had adequate oral intake. However, oral intake has been inconsistent so TPN to be restarted.  4/19 - per FMTS patient will remain on TPN until albumin > 2.5 and is able to have EGD with GI   Glucose / Insulin: CBGs well controlled, SSI dc'd Electrolytes: wnl Renal: wnl LFTs / TGs: Trigs 55, LFTs WNL Prealbumin / albumin: Prealbumin 9.2>11.1>17.8>19.1, Albumin 1.4>1.7>1.9>1.9>2.0 Intake / Output; MIVF: UOP 3 ml/kg/hr, LBM 5/3 x 1 GI Imaging: 3/31 - barium swallow - unable to pass tablet - study shows esophageal dysmotility, dilation, and narrowing  4/1 CT abd pelvis - anasarca and ascites; likely underlying liver disease  Surgeries / Procedures: see above GI recommends EGD with dilation but pt needs improved nutritional status   Central access: PICC 4/1 TPN start date: 12/22/2019>>4/10; 4/16>>  Nutritional Goals (per RD recommendation on 4/28): kCal: 1750-1950 per day, Protein: 90-105 g/day, Fluid: > 1.7 L  Current Nutrition:  TPN Dysphagia 1 diet - variable, eating 0-100% of meals Prostat TID - 100 cal, 15 g protein, 10 g carbs (each) x 3 yesterday Boost BreezeTID - taking consistently, each supplement provides 250kcal and 9g protein x 3 yesterday  Plan:  Continue current TPN with a cycle over 12 hours - 33 mL/hr x 1 hr, 65 mL/hr x 10 hr, then 33 mL/hr x 1 hr - provides 32g protein and 859kcal per day Oral nutritional supplements are providing 1050 kcal/day and 72g  protein/day, meeting ~75% of protein needs Oral supplements and TPN meet 100% of needs, pt is also taking food as able so she is meeting >100% of nutritional needs  TPN Lytes: continue increased Na and K Monitor TPN labs Mon/Thurs - F/u GI recommendations to stop tpn since albumin is 2  Thank you for involving pharmacy in this patient's care.  Richardine Service, PharmD PGY1 Pharmacy Resident Phone: [phone] 01/22/2020  6:45 AM  Please check [email or web address] for unit-specific pharmacy phone numbers.

## 2020-01-22 NOTE — Progress Notes (Addendum)
Brief Nutrition Follow-Up Note  Pt just assessed yesterday. See progress note on 01/21/20 for further details.   Pt remains on a dysphagia 1 diet, however, orders items such as soups, broths, applesauce, juices, and soft drinks. She has attempted to eat purees in the past (thinned with hot water), however, had difficulty with vomiting and heartburn when attempted. She is complains with Boost Breeze and Prostat supplements, which provides 1050 kcals and 72 grams protein.   Case discussed with pharmacist regarding TPN, who reports possible decrease in calories in TPN. Per pharmacy note, plan to continue current TPN with a cycle over 12 hours - 33 mL/hr x 1 hr, 65 mL/hr x 10 hr, then 33 mL/hr x 1 hr - provides 32g protein and 859kcal per day.   Reviewed GI note, who has re-evaluated pt for EGD secondary to albumin increasing to 2. MD, pt, and family would like to postpone EGD until albumin has reached 2.5.   INTERVENTION:   -TPN management per pharmacy -Continue30 ml Prostat TID, each supplement provides 100 kcals and 15 grams protein -ContinueBoost Breeze poTID, each supplement provides 250 kcal and 9 grams of protein -Continue MVI with minerals daily -Continue dysphagia 1 diet with thin liquids  [name], RD, LDN, CDCES Registered Dietitian II Certified Diabetes Care and Education Specialist Please refer to [facility] for RD and/or RD on-call/weekend/after hours pager

## 2020-01-22 NOTE — Progress Notes (Addendum)
Family Medicine Teaching Service Daily Progress Note Intern Pager: [phone]  Patient name: Deanna Cunningham Medical record number: [id number] Date of birth: [birth year] Age: 74 y.o. Gender: female  Primary Care Provider: Kristie Cowman, MD Consultants: GI, Gen Surg Code Status: full code  Pt Overview and Major Events to Date:  03/26-Admitted 04/01-PICC inserted  01/22/20: Albumin reached 2.0  Assessment and Plan:  Deanna Cunningham a 74 y.o.femalewho presented w/ global weakness, weight loss and failure to thrive. PMHx s/f malnutrition, RA (naive to care), chronic anemia, GERD.  Underweight, severe protein caloriemalnutrition In the setting of known esophageal dysmotility, presbyesophagus: Stable Patient reports that she is doing well this AM.  Albumin increased to 2.0 this AM.  Discussed updated albumin with Dr. Collene Mares, patient's outpatient GI physician, who states that she will have Dr. Almyra Free see the patient and determine if she is safe for EGD..  Magnesium 1.8, phosphorus 4.5, -GI and surgery recommends having albumin ~2-2.5 prior to proceeding with EGD. -Follow-up GI recommendations after evaluating patient today -TPN per pharmacy -Labs per pharmacy: Every Monday & Thursday: CBC, CMP, mag, phosphorus.    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 - Avoid QT prolonging medication  Normocytic anemia: Chronic, stable Hemoglobin this AM stable at 8.4.  -CBC Mondays and Thursdays  GERD: Chronic, stable - Continue Pepcid 20 mg twice daily - Continue Protonix 40 mg daily - Continue sucralfate with meals and bedtime - May have GI cocktail at patient's request  Sacral wound: Stable/improved - Routine wound care - Position  changes frequently every 2 hours - Encourage out of bed - PT/OT following  Rheumatoid arthritis: Chronic, stable. - Follows with rheumatology outpatient  FEN/GI: - Dysphagia 1 diet - TPN  Prophylaxis: lovenox   Disposition: Patient stable on TPN, awaiting SNF acceptance   Subjective:  Patient reports that she is doing well this morning.  Reports that her left foot was bothering her overnight but that is now resolved  Objective: Temp:  [98.2 F (36.8 C)-98.4 F (36.9 C)] 98.2 F (36.8 C) (05/06 0349) Pulse Rate:  [95-98] 95 (05/06 0349) Resp:  [15-17] 16 (05/06 0349) BP: (103-114)/(54-67) 103/55 (05/06 0349) SpO2:  [99 %-100 %] 99 % (05/06 0349)  Physical Exam: General: Cachectic female appearing stated age in no acute distress, lying in bed, diffuse muscle atrophy in face and upper and lower extremities Cardio: Normal S1 and S2, no S3 or S4. Rhythm is regular. No murmurs or rubs.  Bilateral radial pulses palpable Pulm: Bilateral bases crackles, no wheezing, or diminished breath sounds. Normal respiratory effort, stable on room air Abdomen: Bowel sounds normal. Abdomen soft and non-tender.  Extremities: trace LE edema in RLE>LLE Neuro: pt alert and oriented x4   Laboratory: Recent Labs  Lab 01/19/20 0441 01/19/20 1812 01/22/20 0306  WBC 5.0  --  5.4  HGB 6.8* 8.5* 8.4*  HCT 22.2* 27.3* 26.5*  PLT 278  --  241   Recent Labs  Lab 01/17/20 0418 01/19/20 0441 01/22/20 0306  NA 138 138 137  K 3.6 3.8 3.9  CL 108 107 108  CO2 25 26 26   BUN 25* 24* 27*  CREATININE 0.48 0.46 0.52  CALCIUM 8.2* 8.4* 8.3*  PROT  --  6.2* 6.0*  BILITOT  --  0.2* 0.4  ALKPHOS  --  58 59  ALT  --  13 11  AST  --  15 13*  GLUCOSE 103* 98 104*    Imaging/Diagnostic Tests: No results found.   Stark Klein, MD 01/22/2020, 9:42 AM PGY-2, [place] Intern pager: [phone], text pages welcome

## 2020-01-22 NOTE — Progress Notes (Signed)
Physical Therapy Treatment Patient Details Name: Deanna Cunningham MRN: [id number] DOB: 1946/02/04 Today's Date: 01/22/2020    History of Present Illness Pt is a 74 y.o. female admitted 12/12/19 with progressive weakness, poor PO intake and BLE swelling; worked up for malnutrition (concern for malignancy). Admitted with severe protein calorie malnutrition In the setting of known esophageal dysmotility. Pt also with anemia, electrolyte imbalance, stage 1 sacral wound. Pt with recurrent asymptomatic SVT; may occur in the settings of severe malnutrition and electrolyte imbalance. PMH includes dysphagia, CKD III, anemia, RA.    PT Comments    Pt making progress towards her physical therapy goals, remains very motivated to participate. Session focused on ambulation and therapeutic exercises for strengthening/ROM. Pt requiring up to min assist for transfers, ambulating 150 feet with a walker at a min guard assist level. Continues with generalized weakness/debility, balance impairments, decreased sensation and decreased endurance. Will continue to progress as tolerated.     Follow Up Recommendations  SNF;Supervision/Assistance - 24 hour     Equipment Recommendations  3in1 (PT)    Recommendations for Other Services       Precautions / Restrictions Precautions Precautions: Fall;Other (comment) Precaution Comments: Bilateral hand/finger RA, sacral wound, rectal prolapse (occurred 4/16) Restrictions Weight Bearing Restrictions: No    Mobility  Bed Mobility Overal bed mobility: Modified Independent Bed Mobility: Supine to Sit              Transfers Overall transfer level: Needs assistance Equipment used: Rolling walker (2 wheeled) Transfers: Sit to/from Stand Sit to Stand: Min guard;Min assist         General transfer comment: Min guard for safety, increased time to rise. MinA with fatigue  Ambulation/Gait Ambulation/Gait assistance: Min guard Gait Distance (Feet): 150  Feet Assistive device: Rolling walker (2 wheeled) Gait Pattern/deviations: Step-through pattern;Decreased stride length;Narrow base of support;Decreased dorsiflexion - right;Decreased dorsiflexion - left Gait velocity: decreased   General Gait Details: Slow, steady gait with decreased bilateral heel strike (pt has decreased sensation in both feet)   Stairs             Wheelchair Mobility    Modified Rankin (Stroke Patients Only)       Balance Overall balance assessment: Needs assistance Sitting-balance support: Feet supported;No upper extremity supported Sitting balance-Leahy Scale: Good Sitting balance - Comments: Can reach bilateral feet to fix socks    Standing balance support: Single extremity supported;During functional activity Standing balance-Leahy Scale: Fair                              Cognition Arousal/Alertness: Awake/alert Behavior During Therapy: WFL for tasks assessed/performed Overall Cognitive Status: Within Functional Limits for tasks assessed                                        Exercises General Exercises - Lower Extremity Hip ABduction/ADduction: Both;15 reps;Supine;Other (comment)(with orange theraband) Other Exercises Other Exercises: Supine: bridging (2 sets of 10) Other Exercises: Sitting: ankle dorsiflexion, eversion, inversion x 10 each with orange theraband. Education provided on performing "ABC's" for ankle ROM Other Exercises: Serial sit to stands x 5    General Comments        Pertinent Vitals/Pain Pain Assessment: No/denies pain    Home Living  Prior Function            PT Goals (current goals can now be found in the care plan section) Acute Rehab PT Goals Patient Stated Goal: pt wanting to go home now PT Goal Formulation: With patient Time For Goal Achievement: 02/05/20 Potential to Achieve Goals: Fair Progress towards PT goals: Progressing toward goals     Frequency    Min 2X/week      PT Plan Current plan remains appropriate    Co-evaluation              AM-PAC PT "6 Clicks" Mobility   Outcome Measure  Help needed turning from your back to your side while in a flat bed without using bedrails?: None Help needed moving from lying on your back to sitting on the side of a flat bed without using bedrails?: None Help needed moving to and from a bed to a chair (including a wheelchair)?: A Little Help needed standing up from a chair using your arms (e.g., wheelchair or bedside chair)?: A Little Help needed to walk in hospital room?: A Little Help needed climbing 3-5 steps with a railing? : A Lot 6 Click Score: 19    End of Session Equipment Utilized During Treatment: Gait belt Activity Tolerance: Patient tolerated treatment well Patient left: in chair;with call bell/phone within reach;with chair alarm set Nurse Communication: Mobility status PT Visit Diagnosis: Other abnormalities of gait and mobility (R26.89);Muscle weakness (generalized) (M62.81);Difficulty in walking, not elsewhere classified (R26.2) Pain - part of body: Leg;Ankle and joints of foot     Time: [id number] PT Time Calculation (min) (ACUTE ONLY): 36 min  Charges:  $Therapeutic Exercise: 8-22 mins $Therapeutic Activity: 8-22 mins                       Wyona Almas, PT, DPT Acute Rehabilitation Services Pager [phone] Office [phone]    Deanna Cunningham 01/22/2020, 5:27 PM

## 2020-01-23 MED ORDER — TRAVASOL 10 % IV SOLN
INTRAVENOUS | Status: AC
  Filled 2020-01-23: qty 324

## 2020-01-23 NOTE — Progress Notes (Signed)
PHARMACY - TOTAL PARENTERAL NUTRITION CONSULT NOTE   Indication: malnutrition  Patient Measurements: Height: 5\' 4"  (162.6 cm) Weight: 42.2 kg (93 lb 0.6 oz) IBW/kg (Calculated) : 54.7 TPN AdjBW (KG): 42.2 Body mass index is 15.97 kg/m.  Assessment: 74 yo female initially presented on 3/26 for weakness. Patient with PMH of RA, chronic anemia and GERD. She has had ongoing poor intake since Dec 2020 with LE swelling and unintentional weight loss. Pt previously on TPN from 4/5>4/10 which was stopped since pt had adequate oral intake. However, oral intake has been inconsistent so TPN to be restarted. Known esophageal dysmotility, presbyesophagus  4/19 - per FMTS patient will remain on TPN until albumin > 2.5 and is able to have EGD with GI.  Glucose / Insulin: CBGs well controlled, SSI dc'd Electrolytes: WNL on 5/6. None new 5/7 AM. Renal: Scr 0.52.  LFTs / TGs: Trigs 55, LFTs ok Prealbumin / albumin: Prealbumin 9.2>11.1>17.8>19.1, Albumin 1.4>1.7>1.9>1.9>2.0 Intake / Output; MIVF: UOP 3.5 ml/kg/hr, LBM 5/4  GI Imaging: 3/31 - barium swallow - unable to pass tablet - study shows esophageal dysmotility, dilation, and narrowing  4/1 CT abd pelvis - anasarca and ascites; likely underlying liver disease  Surgeries / Procedures: see above GI recommends EGD with dilation but pt needs improved nutritional status   Central access: PICC 4/1 TPN start date: 12/22/2019>>4/10; 4/16>>  Nutritional Goals (per RD recommendation on 4/28): kCal: 1750-1950 per day, Protein: 90-105 g/day, Fluid: > 1.7 L  Current Nutrition:  TPN Dysphagia 1 diet - variable, eating 0-100% of meals (50% x 2 meals on 5/7) Prostat TID - 100 cal, 15 g protein, 10 g carbs (each) x 3 yesterday Boost BreezeTID - taking consistently, each supplement provides 250kcal and 9g protein x 3 yesterday  Plan:  - Continue current TPN with inappropriate indication and cycle over 12 hours - 33 mL/hr x 1 hr, 65 mL/hr x 10 hr, then 33 mL/hr  x 1 hr - provides 32g protein and 859kcal per day ($1600/d charge) - Oral nutritional supplements are providing 1050 kcal/day and 72g protein/day, meeting ~75% of protein needs - Oral supplements and TPN meet 100% of needs, pt is also taking food as able so she is meeting >100% of nutritional needs  - TPN Lytes: continue increased Na and K. No change 5/7. - Monitor TPN labs Mon/Thurs - Will not reduce TPN macronutrients, though slightly overfeeding, in hopes to get to albumin 2.5 sooner.   Carmalita Wakefield S. Alford Highland, PharmD, BCPS Clinical Staff Pharmacist [email or web address] Please check [email or web address] for unit-specific pharmacy phone numbers.

## 2020-01-23 NOTE — Plan of Care (Signed)
  Problem: Activity: Goal: Risk for activity intolerance will decrease Outcome: Progressing   Problem: Nutrition: Goal: Adequate nutrition will be maintained Outcome: Progressing   

## 2020-01-23 NOTE — Progress Notes (Signed)
Family Medicine Teaching Service Daily Progress Note Intern Pager: [phone]  Patient name: Deanna Cunningham Medical record number: [id number] Date of birth: 03/07/1946 Age: 74 y.o. Gender: female  Primary Care Provider: Kristie Cowman, MD Consultants: GI, Gen Surg Code Status: full code  Pt Overview and Major Events to Date:  03/26-Admitted 04/01-PICC inserted  01/22/20: Albumin reached 2.0  Assessment and Plan:  Deanna Cunningham a 74 y.o.femalewho presented w/ global weakness, weight loss and failure to thrive. PMHx s/f malnutrition, RA (naive to care), chronic anemia, GERD.  Underweight, severe protein caloriemalnutrition In the setting of known esophageal dysmotility, presbyesophagus: Stable Albumin up to 2.0 yesterday, GI evaluated and recommend to call back when albumin increased to 2.5 -GI and surgery recommends having albumin 2.5 before safely proceeding with EGD and dilation, patient and family agreeable with this plan. -continue TPN per pharmacy -Labs per pharmacy: Every Monday & Thursday: CBC, CMP, mag, phosphorus.    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 - Avoid QT prolonging medication  Normocytic anemia: Chronic, stable Hemoglobin this AM stable at 8.4.  -CBC Mondays and Thursdays  GERD: Chronic, stable - Continue Pepcid 20 mg twice daily - Continue Protonix 40 mg daily - Continue sucralfate with meals and bedtime - May have GI cocktail at patient's request  Sacral wound: Stable/improved - Routine wound care - Position changes frequently every 2 hours - Encourage out of bed - PT/OT following  Rheumatoid arthritis: Chronic, stable. - Follows with rheumatology outpatient  FEN/GI: - Dysphagia 1  diet - TPN  Prophylaxis: lovenox   Disposition: Patient stable on TPN, awaiting SNF acceptance   Subjective:  Patient reports that she is doing well this morning.  Patient updated on goal of 2.5 for albumin prior to EGD/dilation procedure.   Objective: Temp:  [98 F (36.7 C)-98.1 F (36.7 C)] 98 F (36.7 C) (05/07 0826) Pulse Rate:  [89-98] 89 (05/07 0826) Resp:  [17-18] 17 (05/07 0826) BP: (109-120)/(60-61) 109/60 (05/07 0826) SpO2:  [99 %-100 %] 99 % (05/07 0826)  Physical Exam: General: female cachectic appearing rash that no acute distress, lying in bed in no acute distress HEENT: Mucous membranes appear moist today Cardio: Normal S1 and S2, no S3 or S4. Rhythm is regular. No murmurs or rubs.  Bilateral radial pulses palpable Pulm: Patient continues to have crackles at bilateral lung bases, normal respiratory effort, stable on room air Abdomen: Bowel sounds normal. Abdomen soft and non-tender. Extremities: Warm/ well perfused.  Patient with improved trace lower extremity edema Neuro: pt alert and oriented x4   Laboratory: Recent Labs  Lab 01/19/20 0441 01/19/20 1812 01/22/20 0306  WBC 5.0  --  5.4  HGB 6.8* 8.5* 8.4*  HCT 22.2* 27.3* 26.5*  PLT 278  --  241   Recent Labs  Lab 01/17/20 0418 01/19/20 0441 01/22/20 0306  NA 138 138 137  K 3.6 3.8 3.9  CL 108 107 108  CO2 25 26 26   BUN 25* 24* 27*  CREATININE 0.48 0.46 0.52  CALCIUM 8.2* 8.4* 8.3*  PROT  --  6.2* 6.0*  BILITOT  --  0.2* 0.4  ALKPHOS  --  58 59  ALT  --  13 11  AST  --  15 13*  GLUCOSE 103* 98  104*    Imaging/Diagnostic Tests: No results found.   Stark Klein, MD 01/23/2020, 9:53 AM PGY-2, [place] Intern pager: [phone], text pages welcome

## 2020-01-23 NOTE — TOC Progression Note (Signed)
Transition of Care [facility]) - Progression Note    Patient Details  Name: JAMAR RUMMELL MRN: [id number] Date of Birth: 1946/02/25  Transition of [facility]) CM/SW Contact  Wandra Feinstein [place], [place] Phone Number: 01/23/2020, 3:13 PM  Clinical Narrative:   Per MD, possible transition to TPN 3 days/week. Re-sent pt's information to [facility], [facility] facilities, and [facility]. Offer received from [place] [facility] in [place]. Janie with [facility] and Juliann Pulse with [facility] still reviewing. Also had [facility]s ([facility]) review for possible admission, but pt does not currently meet LTAC criteria. Will await determination from [facility] and [facility] before presenting SNF offers to pt/family. Will need BCBS auth once facility chosen.   Wandra Feinstein, MSW, LCSW [phone] (coverage)       Expected Discharge Plan: [place] Barriers to Discharge: No SNF bed(pt requiring TPN)  Expected Discharge Plan and Services Expected Discharge Plan: [place]   Discharge Planning Services: CM Consult   Living arrangements for the past 2 months: Single Family Home                                       Social Determinants of Health (SDOH) Interventions    Readmission Risk Interventions No flowsheet data found.

## 2020-01-23 NOTE — Progress Notes (Signed)
Physical Therapy Treatment Patient Details Name: Deanna Cunningham MRN: [id number] DOB: [birth year] Today's Date: 01/23/2020    History of Present Illness Pt is a 74 y.o. female admitted 12/12/19 with progressive weakness, poor PO intake and BLE swelling; worked up for malnutrition (concern for malignancy). Admitted with severe protein calorie malnutrition In the setting of known esophageal dysmotility. Pt also with anemia, electrolyte imbalance, stage 1 sacral wound. Pt with recurrent asymptomatic SVT; may occur in the settings of severe malnutrition and electrolyte imbalance. PMH includes dysphagia, CKD III, anemia, RA.    PT Comments    Pt making steady progress towards her physical therapy goals, remains very motivated to participate in therapy. Her goal is to get her shoulders stronger so she will be able to stand independently. Session focused on therapeutic exercises for strengthening bilateral lower extremities and upper extremities with resistance. Pt ambulating limited hallway distances with a walker at a min guard assist level. Continues with significant debility/deconditioning, balance impairments, and decreased endurance. Will continue to progress as tolerated.     Follow Up Recommendations  SNF;Supervision/Assistance - 24 hour     Equipment Recommendations  3in1 (PT)    Recommendations for Other Services       Precautions / Restrictions Precautions Precautions: Fall;Other (comment) Precaution Comments: Bilateral hand/finger RA, sacral wound, rectal prolapse (occurred 4/16) Restrictions Weight Bearing Restrictions: No    Mobility  Bed Mobility Overal bed mobility: Independent Bed Mobility: Supine to Sit           General bed mobility comments: Bed flat  Transfers Overall transfer level: Needs assistance Equipment used: Rolling walker (2 wheeled) Transfers: Sit to/from Stand Sit to Stand: Min guard         General transfer comment: Min guard to rise from  edge of bed and recliner  Ambulation/Gait Ambulation/Gait assistance: Min guard Gait Distance (Feet): 250 Feet Assistive device: Rolling walker (2 wheeled) Gait Pattern/deviations: Step-through pattern;Decreased stride length;Narrow base of support;Decreased dorsiflexion - right;Decreased dorsiflexion - left Gait velocity: decreased   General Gait Details: Very slow gait with min guard assist for mobility. Tendency to supinate on left foot due to pain and decreased sensation.    Stairs             Wheelchair Mobility    Modified Rankin (Stroke Patients Only)       Balance Overall balance assessment: Needs assistance Sitting-balance support: Feet supported;No upper extremity supported Sitting balance-Leahy Scale: Good     Standing balance support: Single extremity supported;During functional activity Standing balance-Leahy Scale: Fair                              Cognition Arousal/Alertness: Awake/alert Behavior During Therapy: WFL for tasks assessed/performed Overall Cognitive Status: Within Functional Limits for tasks assessed                                        Exercises General Exercises - Upper Extremity Shoulder Flexion: Both;10 reps;Seated;Other (comment)(with ~1 lb weight) Shoulder Exercises Shoulder External Rotation: Strengthening;Both;10 reps;Theraband Theraband Level (Shoulder External Rotation): Other (comment)(orange) Other Exercises Other Exercises: Supine: bridging (2 sets of 10) Other Exercises: Sitting: ankle dorsiflexion, eversion, inversion x 10 each with orange theraband. Education provided on performing "ABC's" for ankle ROM Other Exercises: Serial sit to stands x 3 Other Exercises: Sitting: rows (2 sets of 10) with orange  theraband     General Comments        Pertinent Vitals/Pain Pain Assessment: No/denies pain    Home Living                      Prior Function            PT Goals  (current goals can now be found in the care plan section) Acute Rehab PT Goals Patient Stated Goal: pt wanting to go home now PT Goal Formulation: With patient Time For Goal Achievement: 02/05/20 Potential to Achieve Goals: Fair Progress towards PT goals: Progressing toward goals    Frequency    Min 2X/week      PT Plan Current plan remains appropriate    Co-evaluation              AM-PAC PT "6 Clicks" Mobility   Outcome Measure  Help needed turning from your back to your side while in a flat bed without using bedrails?: None Help needed moving from lying on your back to sitting on the side of a flat bed without using bedrails?: None Help needed moving to and from a bed to a chair (including a wheelchair)?: A Little Help needed standing up from a chair using your arms (e.g., wheelchair or bedside chair)?: A Little Help needed to walk in hospital room?: A Little Help needed climbing 3-5 steps with a railing? : A Lot 6 Click Score: 19    End of Session Equipment Utilized During Treatment: Gait belt Activity Tolerance: Patient tolerated treatment well Patient left: in chair;with call bell/phone within reach;with chair alarm set Nurse Communication: Mobility status PT Visit Diagnosis: Other abnormalities of gait and mobility (R26.89);Muscle weakness (generalized) (M62.81);Difficulty in walking, not elsewhere classified (R26.2) Pain - part of body: Leg;Ankle and joints of foot     Time: 1425-1503 PT Time Calculation (min) (ACUTE ONLY): 38 min  Charges:  $Therapeutic Exercise: 23-37 mins $Therapeutic Activity: 8-22 mins                       Wyona Almas, PT, DPT Acute Rehabilitation Services Pager [phone] Office [phone]    Deno Etienne 01/23/2020, 5:13 PM

## 2020-01-24 MED ORDER — TRAVASOL 10 % IV SOLN
INTRAVENOUS | Status: AC
  Filled 2020-01-24: qty 324

## 2020-01-24 NOTE — Progress Notes (Signed)
Family Medicine Teaching Service Daily Progress Note Intern Pager: [phone]  Patient name: Deanna Cunningham Medical record number: [id number] Date of birth: [birth year] Age: 74 y.o. Gender: female  Primary Care Provider: Kristie Cowman, MD Consultants: GI, Gen Surg Code Status: full code  Pt Overview and Major Events to Date:  03/26-Admitted 04/01-PICC inserted  01/22/20: Albumin reached 2.0  Assessment and Plan:  Deanna Cunningham a 74 y.o.femalewho presented w/ global weakness, weight loss and failure to thrive. PMHx s/f malnutrition, RA (naive to care), chronic anemia, GERD.  Underweight, severe protein caloriemalnutrition In the setting of known esophageal dysmotility, presbyesophagus: Stable Patient has been tolerating TPN and PO diet with dysphagia diet.  -GI and surgery recommends having albumin 2.5 before safely proceeding with EGD and dilation, patient and family agreeable with this plan. -continue TPN per pharmacy -Labs per pharmacy: Every Monday & Thursday: CBC, CMP, mag, phosphorus.    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 - Avoid QT prolonging medication  Normocytic anemia: Chronic, stable Hemoglobin this AM stable at 8.4.  -CBC Mondays and Thursdays  GERD: Chronic, stable - Continue Pepcid 20 mg twice daily - Continue Protonix 40 mg daily - Continue sucralfate with meals and bedtime - May have GI cocktail at patient's request  Sacral wound: Stable/improved - Routine wound care - Position changes frequently every 2 hours - Encourage out of bed - PT/OT following  Rheumatoid arthritis: Chronic, stable. - Follows with rheumatology outpatient  FEN/GI: - Dysphagia 1 diet - TPN  Prophylaxis:  lovenox   Disposition: Patient stable on TPN, awaiting SNF acceptance   Subjective:  Patient states that she has some knee pain in her right knee that started last night.  He attributes this to recently exercising her legs.  Patient offered heating pad and declined at this time stating she treatment within her knee and it will "work itself out".   Objective: Temp:  [98.4 F (36.9 C)-98.5 F (36.9 C)] 98.5 F (36.9 C) (05/08 0908) Pulse Rate:  [89-104] 89 (05/08 0908) Resp:  [15-17] 17 (05/08 0908) BP: (101-111)/(53-71) 101/53 (05/08 0908) SpO2:  [100 %] 100 % (05/08 0908) Weight:  [43.1 kg] 43.1 kg (05/08 0606)  Physical Exam:  General: female appearing stated age in no acute distress, and in bed watching television Cardio: Normal S1 and S2, no S3 or S4. Rhythm is regular. No murmurs or rubs.  Bilateral radial pulses palpable Pulm: Clear to auscultation bilaterally, no crackles on today's exam, wheezing, or diminished breath sounds. Normal respiratory effort, stable on room air Abdomen: Bowel sounds normal. Abdomen soft and non-tender. Extremities: No peripheral edema on today's exam. Warm/ well perfused.  No abnormalities of the skin or joint line for right knee, do not appreciate erythema or bony deformities, patient demonstrates normal range of motion with flexion and extension of the knee, no crepitus Neuro: pt alert and oriented x4   Laboratory: Recent Labs  Lab 01/19/20 0441 01/19/20 1812 01/22/20 0306  WBC 5.0  --  5.4  HGB 6.8* 8.5* 8.4*  HCT 22.2* 27.3* 26.5*  PLT 278  --  241   Recent Labs  Lab 01/19/20 0441 01/22/20 0306  NA 138 137  K 3.8 3.9  CL 107 108  CO2 26 26  BUN 24*  27*  CREATININE 0.46 0.52  CALCIUM 8.4* 8.3*  PROT 6.2* 6.0*  BILITOT 0.2* 0.4  ALKPHOS 58 59  ALT 13 11  AST 15 13*  GLUCOSE 98 104*    Imaging/Diagnostic Tests: No results found.   Deanna Klein, MD 01/24/2020, 10:07 AM PGY-2, [place] Intern  pager: [phone], text pages welcome

## 2020-01-24 NOTE — Progress Notes (Signed)
PHARMACY - TOTAL PARENTERAL NUTRITION CONSULT NOTE   Indication: malnutrition  Patient Measurements: Height: 5\' 4"  (162.6 cm) Weight: 43.1 kg (95 lb 0.3 oz) IBW/kg (Calculated) : 54.7 TPN AdjBW (KG): 42.2 Body mass index is 16.31 kg/m.  Assessment: 74 yo female initially presented on 3/26 for weakness. Patient with PMH of RA, chronic anemia and GERD. She has had ongoing poor intake since Dec 2020 with LE swelling and unintentional weight loss. Pt previously on TPN from 4/5>4/10 which was stopped since pt had adequate oral intake. However, oral intake has been inconsistent so TPN to be restarted. Known esophageal dysmotility, presbyesophagus  4/19 - per FMTS patient will remain on TPN until albumin > 2.5 and is able to have EGD with GI.  Glucose / Insulin: CBGs well controlled, SSI dc'd Electrolytes: WNL on 5/6. None new 5/7 or 5/8 AM. Renal: Scr 0.52.  LFTs / TGs: Trigs 55, LFTs ok Prealbumin / albumin: Prealbumin 9.2>11.1>17.8>19.1, Albumin 1.4>1.7>1.9>1.9>2.0 Intake / Output; MIVF: UOP 3.5 ml/kg/hr, LBM 5/4  GI Imaging: 3/31 - barium swallow - unable to pass tablet - study shows esophageal dysmotility, dilation, and narrowing  4/1 CT abd pelvis - anasarca and ascites; likely underlying liver disease  Surgeries / Procedures: see above GI recommends EGD with dilation but pt needs improved nutritional status   Central access: PICC 4/1 TPN start date: 12/22/2019>>4/10; 4/16>>  Nutritional Goals (per RD recommendation on 4/28): kCal: 1750-1950 per day, Protein: 90-105 g/day, Fluid: > 1.7 L  Current Nutrition:  TPN Dysphagia 1 diet - variable, eating 0-100% of meals (50% x 2 meals on 5/7) Prostat TID - 100 cal, 15 g protein, 10 g carbs (each) x 3 yesterday Boost BreezeTID - taking consistently, each supplement provides 250kcal and 9g protein x 3 yesterday  Plan:  - Continue current TPN with inappropriate indication and cycle over 12 hours - 33 mL/hr x 1 hr, 65 mL/hr x 10 hr, then  33 mL/hr x 1 hr - provides 32g protein and 859kcal per day ($1600/d charge) - Oral nutritional supplements are providing 1050 kcal/day and 72g protein/day, meeting ~75% of protein needs - Oral supplements and TPN meet 100% of needs, pt is also taking food as able so she is meeting >100% of nutritional needs  - TPN Lytes: continue increased Na and K. No change 5/8. - Monitor TPN labs Mon/Thurs - Will not reduce TPN macronutrients, though slightly overfeeding, in hopes to get to albumin 2.5 sooner.   Alanda Slim, PharmD, [facility] Clinical Pharmacist Please see AMION for all Pharmacists' Contact Phone Numbers 01/24/2020, 8:30 AM

## 2020-01-24 NOTE — Plan of Care (Signed)
  Problem: Education: Goal: Knowledge of General Education information will improve Description: Including pain rating scale, medication(s)/side effects and non-pharmacologic comfort measures Outcome: Progressing   Problem: Nutrition: Goal: Adequate nutrition will be maintained Outcome: Progressing   Problem: Safety: Goal: Ability to remain free from injury will improve Outcome: Progressing   

## 2020-01-24 NOTE — TOC Progression Note (Addendum)
Transition of Care [facility]) - Progression Note    Patient Details  Name: Deanna Cunningham MRN: [id number] Date of Birth: 01-30-1946  Transition of Care [facility]) CM/SW [place], [place] Phone Number: [phone] 01/24/2020, 2:06 PM  Clinical Narrative:     CSW reached out to [facility] to check on the status of a possible bed offer. Spoke with [facility] and they stated they needed to get back to me. [place] awaiting a return call.  2:40pm- spoke with Juliann Pulse at [facility] and they are unable to accept patient.  TOC team will continue to assist with discharge planning needs.  Expected Discharge Plan: [place] Barriers to Discharge: No SNF bed(pt requiring TPN)  Expected Discharge Plan and Services Expected Discharge Plan: [place]   Discharge Planning Services: CM Consult   Living arrangements for the past 2 months: Single Family Home                                       Social Determinants of Health (SDOH) Interventions    Readmission Risk Interventions No flowsheet data found.

## 2020-01-25 MED ORDER — TRAVASOL 10 % IV SOLN
INTRAVENOUS | Status: AC
  Filled 2020-01-25: qty 324

## 2020-01-25 NOTE — Progress Notes (Signed)
PHARMACY - TOTAL PARENTERAL NUTRITION CONSULT NOTE   Indication: malnutrition  Patient Measurements: Height: 5\' 4"  (162.6 cm) Weight: 42.7 kg (94 lb 2.2 oz) IBW/kg (Calculated) : 54.7 TPN AdjBW (KG): 42.2 Body mass index is 16.16 kg/m.  Assessment: 74 yo female initially presented on 3/26 for weakness. Patient with PMH of RA, chronic anemia and GERD. She has had ongoing poor intake since Dec 2020 with LE swelling and unintentional weight loss. Pt previously on TPN from 4/5>4/10 which was stopped since pt had adequate oral intake. However, oral intake has been inconsistent so TPN to be restarted. Known esophageal dysmotility, presbyesophagus  4/19 - per FMTS patient will remain on TPN until albumin > 2.5 and is able to have EGD with GI.  Glucose / Insulin: CBGs well controlled, SSI dc'd Electrolytes: WNL on 5/6. None new 5/7 - 5/9 AM. Renal: Scr 0.52.  LFTs / TGs: Trigs 55, LFTs ok Prealbumin / albumin: Prealbumin 9.2>11.1>17.8>19.1, Albumin 1.4>1.7>1.9>1.9>2.0 Intake / Output; MIVF: UOP 2.4 ml/kg/hr, LBM 5/8  GI Imaging: 3/31 - barium swallow - unable to pass tablet - study shows esophageal dysmotility, dilation, and narrowing  4/1 CT abd pelvis - anasarca and ascites; likely underlying liver disease  Surgeries / Procedures: see above GI recommends EGD with dilation but pt needs improved nutritional status   Central access: PICC 4/1 TPN start date: 12/22/2019>>4/10; 4/16>>  Nutritional Goals (per RD recommendation on 4/28): kCal: 1750-1950 per day, Protein: 90-105 g/day, Fluid: > 1.7 L  Current Nutrition:  TPN Dysphagia 1 diet - variable, eating 0-100% of meals (50% x 2 meals on 5/7) Prostat TID - 100 cal, 15 g protein, 10 g carbs (each) x 3 yesterday Boost BreezeTID - taking consistently, each supplement provides 250kcal and 9g protein x 3 yesterday  Plan:  - Continue current TPN with inappropriate indication and cycle over 12 hours - 33 mL/hr x 1 hr, 65 mL/hr x 10 hr, then 33  mL/hr x 1 hr - provides 32g protein and 859kcal per day ($1600/d charge) - Oral nutritional supplements are providing 1050 kcal/day and 72g protein/day, meeting ~75% of protein needs - Oral supplements and TPN meet 100% of needs, pt is also taking food as able so she is meeting >100% of nutritional needs  - TPN Lytes: continue increased Na and K. No change 5/8. - Monitor TPN labs Mon/Thurs - Will not reduce TPN macronutrients, though slightly overfeeding, in hopes to get to albumin 2.5 sooner.   Alanda Slim, PharmD, [facility] Clinical Pharmacist Please see AMION for all Pharmacists' Contact Phone Numbers 01/25/2020, 8:06 AM

## 2020-01-25 NOTE — Progress Notes (Signed)
Family Medicine Teaching Service Daily Progress Note Intern Pager: [phone]  Patient name: Deanna Cunningham Medical record number: [id number] Date of birth: 29-Jul-1946 Age: 74 y.o. Gender: female  Primary Care Provider: Kristie Cowman, MD Consultants: TOC,  Code Status: Full Code   Pt Overview and Major Events to Date:  Hospital Day: 44 12/12/2019: admitted for Weakness and Abnormal Lab 12/18/19 PICC line placed  4/19-4/23 macrobid for UTI  01/22/20 albumin 2.0  Assessment and Plan: Deanna Cunningham is a 74 y.o. female who presented w/ global weakness, weight loss, failure to thrive.  Past medical history significant for malnutrition, rheumatoid arthritis (nave to care), chronic anemia, GERD.   FTT Presbyesophagus, esophageal dysmotility GERD Plans for EGD with dilation with GI and surgery.  However, prior to this, albumin should reach 2.5 to safely proceed. Meals were not recorded yesterday. 3 nutritionial supplements in last 24 hours  -Continue TPN and lab monitoring per pharmacy. qMon/Thurs CBC,CMP Mg, Phos. qMon prealbumin, triglyc -Appreciate dietitian recs  -Daily weights -Continue Pepcid 20 mg twice, Protonix 40 mg, sucralfate with meals and at bedtime, thiamine 100 mg daily   - PRN: Compazine q6 n/v, GI cocktail   Generalized Weakness  Likely deconditioned in setting of long hospital stay and poor nutritional status  -continue PT/OT -DME recs ordered  Bifascicular block Moderate MR/TR, stable Last EKG 12/27/2019, QTc 479 -Avoid QT prolonging medications  Anemia, stable Required transfusions during this admission. -Continue CBC Mondays and Thursdays  Rheumatoid arthritis, chronic -Follows with rheumatology outpatient  FEN/GI:  . Fluids: None  . Electrolytes: Replete PRN  . Nutrition: Dysphagia 1, TPN  Access: PICC day 37 (4/1/)  VTE prophylaxis: Lovenox 30 (CrCl <30)   Disposition: SNF, however, no SNFs offering bed 2/2 TPN status.   Subjective:  NAEO.    Objective: Temp:  [98.2 F (36.8 C)-99.1 F (37.3 C)] 99.1 F (37.3 C) (05/09 0345) Pulse Rate:  [89-95] 93 (05/09 0345) Resp:  [17] 17 (05/09 0345) BP: (101-115)/(53-67) 107/67 (05/09 0345) SpO2:  [94 %-100 %] 99 % (05/09 0345) Weight:  [42.7 kg] 42.7 kg (05/09 0500) Intake/Output      05/08 0701 - 05/09 0700 05/09 0701 - 05/10 0700   P.O. 600    I.V. (mL/kg) 717.7 (16.8)    Blood     Total Intake(mL/kg) 1317.7 (30.9)    Urine (mL/kg/hr) 2450 (2.4)    Stool 0    Total Output 2450    Net -1132.3         Stool Occurrence 1 x        Physical Exam: General: cachectic, smiling and alert  Chest: RRR, no murmurs. CTAB. No respiratory distress  Abdomen: + BS, NTND to palpation  MSK: No BLEE, moving extremities spontaneously   Laboratory: I have personally read and reviewed all labs and imaging studies.  CBC: Recent Labs  Lab 01/19/20 0441 01/19/20 1812 01/22/20 0306  WBC 5.0  --  5.4  HGB 6.8* 8.5* 8.4*  HCT 22.2* 27.3* 26.5*  MCV 98.7  --  98.9  PLT 278  --  241   CMP: Recent Labs  Lab 01/19/20 0441 01/22/20 0306  NA 138 137  K 3.8 3.9  CL 107 108  CO2 26 26  GLUCOSE 98 104*  BUN 24* 27*  CREATININE 0.46 0.52  CALCIUM 8.4* 8.3*  MG 1.8 1.8  PHOS 4.0 4.5  ALBUMIN 1.9* 2.0*   CBG: No results for input(s): GLUCAP in the last 168 hours. Micro: Covid Negative  No results found for this or any previous visit (from the past 240 hour(s)).   Imaging/Diagnostic Tests: No results found.  EKG Interpretation  Date/Time:  Friday December 12 2019 13:08:15 EDT Ventricular Rate:  99 PR Interval:  168 QRS Duration: 132 QT Interval:  402 QTC Calculation: 515 R Axis:   -66 Text Interpretation: Sinus rhythm with occasional Premature ventricular complexes and Premature atrial complexes Left axis deviation Right bundle branch block Anterior infarct , age undetermined Abnormal ECG Porlonged QTc, no STEMI , similar to prior ecg pattern Confirmed by Octaviano Glow  [phone]) on 12/12/2019 2:23:01 PM        Procedures:   Procedure Orders     Critical Care     ED EKG     EKG 12-Lead     EKG 12-Lead     EKG 12-Lead     EKG     EKG 12-Lead     EKG 12-Lead     EKG 12-Lead  Wilber Oliphant, MD 01/25/2020, 9:01 AM PGY-2, [place] Intern pager: [phone], text pages welcome

## 2020-01-25 NOTE — Plan of Care (Signed)
  Problem: Education: Goal: Knowledge of General Education information will improve Description: Including pain rating scale, medication(s)/side effects and non-pharmacologic comfort measures Outcome: Progressing   Problem: Pain Managment: Goal: General experience of comfort will improve Outcome: Progressing   

## 2020-01-25 NOTE — Plan of Care (Signed)
  Problem: Education: Goal: Knowledge of General Education information will improve Description: Including pain rating scale, medication(s)/side effects and non-pharmacologic comfort measures Outcome: Progressing   Problem: Elimination: Goal: Will not experience complications related to bowel motility Outcome: Progressing   Problem: Safety: Goal: Ability to remain free from injury will improve 01/25/2020 1229 by Melina Schools, RN Outcome: Progressing 01/25/2020 1227 by Melina Schools, RN Outcome: Progressing

## 2020-01-25 NOTE — TOC Progression Note (Signed)
Transition of Care [facility]) - Progression Note    Patient Details  Name: Deanna Cunningham MRN: [id number] Date of Birth: 1946-04-10  Transition of Care [facility]) CM/SW [place], [place] Phone Number: [phone] 01/25/2020, 3:23 PM  Clinical Narrative:    CSW spoke with Wendi at [facility] and she stated that they would not be able to give an answer on acceptance until the beginning of the week.  TOC team will continue to assist with discharge planning needs.  Expected Discharge Plan: [place] Barriers to Discharge: No SNF bed(pt requiring TPN)  Expected Discharge Plan and Services Expected Discharge Plan: [place]   Discharge Planning Services: CM Consult   Living arrangements for the past 2 months: Single Family Home                                       Social Determinants of Health (SDOH) Interventions    Readmission Risk Interventions No flowsheet data found.

## 2020-01-26 LAB — COMPREHENSIVE METABOLIC PANEL
ALT: 11 U/L (ref 0–44)
AST: 15 U/L (ref 15–41)
Albumin: 2.2 g/dL — ABNORMAL LOW (ref 3.5–5.0)
Alkaline Phosphatase: 56 U/L (ref 38–126)
Anion gap: 7 (ref 5–15)
BUN: 29 mg/dL — ABNORMAL HIGH (ref 8–23)
CO2: 26 mmol/L (ref 22–32)
Calcium: 8.7 mg/dL — ABNORMAL LOW (ref 8.9–10.3)
Chloride: 103 mmol/L (ref 98–111)
Creatinine, Ser: 0.52 mg/dL (ref 0.44–1.00)
GFR calc Af Amer: >60 mL/min (ref >60)
GFR calc non Af Amer: >60 mL/min (ref >60)
Glucose, Bld: 104 mg/dL — ABNORMAL HIGH (ref 70–99)
Potassium: 4 mmol/L (ref 3.5–5.1)
Sodium: 136 mmol/L (ref 135–145)
Total Bilirubin: 0.3 mg/dL (ref 0.3–1.2)
Total Protein: 6.3 g/dL — ABNORMAL LOW (ref 6.5–8.1)

## 2020-01-26 LAB — CBC
HCT: 27.8 % — ABNORMAL LOW (ref 36.0–46.0)
Hemoglobin: 8.5 g/dL — ABNORMAL LOW (ref 12.0–15.0)
MCH: 30.9 pg (ref 26.0–34.0)
MCHC: 30.6 g/dL (ref 30.0–36.0)
MCV: 101.1 fL — ABNORMAL HIGH (ref 80.0–100.0)
Platelets: 240 10*3/uL (ref 150–400)
RBC: 2.75 MIL/uL — ABNORMAL LOW (ref 3.87–5.11)
RDW: 15.1 % (ref 11.5–15.5)
WBC: 5.6 10*3/uL (ref 4.0–10.5)
nRBC: 0 % (ref 0.0–0.2)

## 2020-01-26 LAB — TRIGLYCERIDES: Triglycerides: 105 mg/dL (ref <150)

## 2020-01-26 LAB — PREALBUMIN: Prealbumin: 21.5 mg/dL (ref 18–38)

## 2020-01-26 LAB — MAGNESIUM: Magnesium: 1.8 mg/dL (ref 1.7–2.4)

## 2020-01-26 LAB — PHOSPHORUS: Phosphorus: 4.7 mg/dL — ABNORMAL HIGH (ref 2.5–4.6)

## 2020-01-26 MED ORDER — TRAVASOL 10 % IV SOLN
INTRAVENOUS | Status: DC
  Filled 2020-01-26: qty 324

## 2020-01-26 MED ORDER — TRAVASOL 10 % IV SOLN
INTRAVENOUS | Status: AC
  Filled 2020-01-26 (×2): qty 324

## 2020-01-26 NOTE — Plan of Care (Signed)
  Problem: Activity: Goal: Risk for activity intolerance will decrease Outcome: Progressing   

## 2020-01-26 NOTE — TOC Progression Note (Signed)
CM contacted Janie at [facility] who advised the pt's insurance does not contract with their facility, so they are also unable to accept.  TOC team will continue to follow.

## 2020-01-26 NOTE — Progress Notes (Signed)
Physical Therapy Treatment Patient Details Name: Deanna Cunningham MRN: [id number] DOB: May 16, 1946 Today's Date: 01/26/2020    History of Present Illness Pt is a 74 y.o. female admitted 12/12/19 with progressive weakness, poor PO intake and BLE swelling; worked up for malnutrition (concern for malignancy). Admitted with severe protein calorie malnutrition In the setting of known esophageal dysmotility. Pt also with anemia, electrolyte imbalance, stage 1 sacral wound. Pt with recurrent asymptomatic SVT; may occur in the settings of severe malnutrition and electrolyte imbalance. PMH includes dysphagia, CKD III, anemia, RA.    PT Comments    Pt supine in bed on arrival.  Pt eager to mobilize but hesitant to mobilize without the RW.  She is more unsteady without device and required moderate assistance to right multiple LOB.  As gt progressed she started to require min.  Performed gt without device for balance challenge.  Pt also performed LE exercises in standing.  Pt continues to present with decreased strength, coordination and balance this session.  Continue to recommend rehab in a post acute setting.     Follow Up Recommendations  SNF;Supervision/Assistance - 24 hour     Equipment Recommendations  3in1 (PT)    Recommendations for Other Services       Precautions / Restrictions Precautions Precautions: Fall;Other (comment) Precaution Comments: Bilateral hand/finger RA, sacral wound, rectal prolapse (occurred 4/16) Restrictions Weight Bearing Restrictions: No    Mobility  Bed Mobility Overal bed mobility: Modified Independent Bed Mobility: Supine to Sit;Sit to Supine           General bed mobility comments: bed flat and no assistance.  Able to move to edge of bed and back to bed with increased time and use of bed railings.  Transfers Overall transfer level: Needs assistance Equipment used: None Transfers: Sit to/from Stand Sit to Stand: Min assist         General  transfer comment: Cues for hand placement to and from seated surface.  Minor LOB forward requiring assistance to right her balance.  Ambulation/Gait Ambulation/Gait assistance: Min assist;Mod assist Gait Distance (Feet): 40 Feet(+ 20 ft) Assistive device: None Gait Pattern/deviations: Step-through pattern;Decreased stride length;Narrow base of support;Decreased dorsiflexion - right;Decreased dorsiflexion - left;Staggering right;Staggering left;Ataxic;Scissoring Gait velocity: decreased   General Gait Details: Pt with flexed posture and L LE scissors to mid line.  Required min to mod assistance to correct LOB without the RW.  As gt progressed she required min assistance.   Stairs             Wheelchair Mobility    Modified Rankin (Stroke Patients Only)       Balance Overall balance assessment: Needs assistance Sitting-balance support: Feet supported;No upper extremity supported Sitting balance-Leahy Scale: Good     Standing balance support: No upper extremity supported Standing balance-Leahy Scale: Poor Standing balance comment: Reliant on external support due to LOB during dynamic gt.                            Cognition Arousal/Alertness: Awake/alert Behavior During Therapy: WFL for tasks assessed/performed Overall Cognitive Status: Within Functional Limits for tasks assessed                                        Exercises General Exercises - Lower Extremity Hip ABduction/ADduction: AROM;Both;10 reps;Standing Hip Flexion/Marching: AROM;Both;10 reps;Standing Heel Raises: AROM;Both;Standing;10 reps Mini-Sqauts:  AROM;Both;10 reps;Standing    General Comments        Pertinent Vitals/Pain Pain Assessment: No/denies pain    Home Living                      Prior Function            PT Goals (current goals can now be found in the care plan section) Acute Rehab PT Goals Patient Stated Goal: pt wanting to go home  now Potential to Achieve Goals: Fair Progress towards PT goals: Progressing toward goals    Frequency    Min 2X/week      PT Plan Current plan remains appropriate    Co-evaluation              AM-PAC PT "6 Clicks" Mobility   Outcome Measure  Help needed turning from your back to your side while in a flat bed without using bedrails?: None Help needed moving from lying on your back to sitting on the side of a flat bed without using bedrails?: None Help needed moving to and from a bed to a chair (including a wheelchair)?: A Little Help needed standing up from a chair using your arms (e.g., wheelchair or bedside chair)?: A Little Help needed to walk in hospital room?: A Little Help needed climbing 3-5 steps with a railing? : A Little 6 Click Score: 20    End of Session Equipment Utilized During Treatment: Gait belt Activity Tolerance: Patient tolerated treatment well;Patient limited by fatigue Patient left: in bed;with call bell/phone within reach;with bed alarm set Nurse Communication: Mobility status PT Visit Diagnosis: Other abnormalities of gait and mobility (R26.89);Muscle weakness (generalized) (M62.81);Difficulty in walking, not elsewhere classified (R26.2) Pain - Right/Left: (Bilateral) Pain - part of body: Leg;Ankle and joints of foot     Time: [id number] PT Time Calculation (min) (ACUTE ONLY): 23 min  Charges:  $Gait Training: 8-22 mins $Therapeutic Exercise: 8-22 mins                     Erasmo Leventhal , PTA Acute Rehabilitation Services Pager [phone] Office [phone]     Deanna Cunningham Eli Hose 01/26/2020, 4:56 PM

## 2020-01-26 NOTE — Progress Notes (Signed)
Family Medicine Teaching Service Daily Progress Note Intern Pager: [phone]  Patient name: Deanna Cunningham Medical record number: [id number] Date of birth: Jan 23, 1946 Age: 74 y.o. Gender: female  Primary Care Provider: Kristie Cowman, MD Consultants: TOC,  Code Status: Full Code   Pt Overview and Major Events to Date:  Hospital Day: 45 12/12/2019: admitted for Weakness and Abnormal Lab 12/18/19 PICC line placed  4/19-4/23 macrobid for UTI  01/22/20 albumin 2.0  Assessment and Plan: Deanna Cunningham is a 74 y.o. female who presented w/ global weakness, weight loss, failure to thrive.  Past medical history significant for malnutrition, rheumatoid arthritis (nave to care), chronic anemia, GERD.   FTT Presbyesophagus, esophageal dysmotility GERD Albumin 2.2 today, 2 on 5/6, phosp 4.7 today, 4.5 on 5/6. TG 105 today, 55 on 5.6. Meals eaten not recorded over the last day. Received Boost x 3 and Pro-stat 50ml yesterday. Plan is for EGD with dilation with GI and surgery. However, prior to this, albumin should reach 2.5 to safely proceed.  -Continue TPN and lab monitoring per pharmacy. qMon/Thurs CBC,CMP Mg, Phos. qMon prealbumin, triglyc -Appreciate dietitian recs  -Daily weights -Continue Pepcid 20 mg twice, Protonix 40 mg, sucralfate with meals and at bedtime, thiamine 100 mg daily   - PRN: Compazine q6 n/v, GI cocktail   Generalized Weakness  Likely deconditioned in setting of long hospital stay and poor nutritional status  -continue PT/OT -DME recs ordered  Bifascicular block Moderate MR/TR, stable Last EKG 12/27/2019, QTc 479 -Avoid QT prolonging medications  Anemia, stable Hb today 8.5, 8.4 on 5/6 Required transfusions during this admission. -Continue CBC Mondays and Thursdays  Rheumatoid arthritis, chronic -Follows with rheumatology outpatient  FEN/GI:  . Fluids: None  . Electrolytes: Replete PRN  . Nutrition: Dysphagia 1, TPN  Access: PICC day 37 (4/1/)  VTE  prophylaxis: Lovenox 30 (CrCl <30)   Disposition: SNF, however, no SNFs offering bed 2/2 TPN status.   Subjective:  Feels well, denies concerns this morning.   Objective: Temp:  [97.7 F (36.5 C)-98.4 F (36.9 C)] 98.4 F (36.9 C) (05/10 0757) Pulse Rate:  [83-96] 83 (05/10 0757) Resp:  [16-18] 16 (05/10 0757) BP: (104-110)/(54-69) 110/54 (05/10 0757) SpO2:  [98 %-100 %] 100 % (05/10 0757) Intake/Output      05/09 0701 - 05/10 0700 05/10 0701 - 05/11 0700   P.O. 480    I.V. (mL/kg) 660.3 (15.5)    Total Intake(mL/kg) 1140.3 (26.7)    Urine (mL/kg/hr) 3800 (3.7) 950 (8.5)   Stool     Total Output 3800 950   Net -2659.7 -950            Physical Exam: General: cachectic appearing 74 yr old female, lying in bed, no acute distress, pleasant  Cardio: S1 and S2 present, RRR Pulm: CTAB, normal WOB Abdomen: Bowel sounds normal. Abdomen soft and non-tender.  Extremities: No peripheral edema. Warm/ well perfused.  Strong radial pulse  Neuro: Cranial nerves grossly intact  CBC: Recent Labs  Lab 01/19/20 1812 01/22/20 0306 01/26/20 0357  WBC  --  5.4 5.6  HGB 8.5* 8.4* 8.5*  HCT 27.3* 26.5* 27.8*  MCV  --  98.9 101.1*  PLT  --  241 240   CMP: Recent Labs  Lab 01/22/20 0306 01/26/20 0358  NA 137 136  K 3.9 4.0  CL 108 103  CO2 26 26  GLUCOSE 104* 104*  BUN 27* 29*  CREATININE 0.52 0.52  CALCIUM 8.3* 8.7*  MG 1.8 1.8  PHOS 4.5 4.7*  ALBUMIN 2.0* 2.2*   CBG: No results for input(s): GLUCAP in the last 168 hours. Micro: Covid Negative  No results found for this or any previous visit (from the past 240 hour(s)).   Imaging/Diagnostic Tests: No results found.  EKG Interpretation  Date/Time:  Friday December 12 2019 13:08:15 EDT Ventricular Rate:  99 PR Interval:  168 QRS Duration: 132 QT Interval:  402 QTC Calculation: 515 R Axis:   -66 Text Interpretation: Sinus rhythm with occasional Premature ventricular complexes and Premature atrial complexes Left axis  deviation Right bundle branch block Anterior infarct , age undetermined Abnormal ECG Porlonged QTc, no STEMI , similar to prior ecg pattern Confirmed by Octaviano Glow [id number]) on 12/12/2019 2:23:01 PM        Procedures:   Procedure Orders     Critical Care     ED EKG     EKG 12-Lead     EKG 12-Lead     EKG 12-Lead     EKG     EKG 12-Lead     EKG 12-Lead     EKG 12-Lead  [name], MD 01/26/2020, 9:38 AM PGY-1, [place] Intern pager: [phone], text pages welcome

## 2020-01-26 NOTE — Plan of Care (Signed)

## 2020-01-26 NOTE — Progress Notes (Signed)
PHARMACY - TOTAL PARENTERAL NUTRITION CONSULT NOTE   Indication: malnutrition  Patient Measurements: Height: 5\' 4"  (162.6 cm) Weight: 42.7 kg (94 lb 2.2 oz) IBW/kg (Calculated) : 54.7 TPN AdjBW (KG): 42.2 Body mass index is 16.16 kg/m.  Assessment: 74 yo female initially presented on 3/26 for weakness. Patient with PMH of RA, chronic anemia and GERD. She has had ongoing poor intake since Dec 2020 with LE swelling and unintentional weight loss. Pt previously on TPN from 4/5>4/10 which was stopped since pt had adequate oral intake. However, oral intake has been inconsistent so TPN to be restarted. Known esophageal dysmotility, presbyesophagus  4/19 - per FMTS patient will remain on TPN until albumin > 2.5 and is able to have EGD with GI.  Glucose / Insulin: CBGs well controlled, SSI dc'd Electrolytes: K 4, Phos 4.7, Mag 1.8. Renal: Scr 0.52, BUN 29 LFTs / TGs: Trigs 105, LFTs WNL Prealbumin / albumin: Prealbumin 9.2>11.1>17.8>19.1>21.5, Albumin 1.4>1.7>1.9>1.9>2.0>2.2 Intake / Output; MIVF: UOP 3.7 ml/kg/hr, LBM 5/8  GI Imaging: 3/31 - barium swallow - unable to pass tablet - study shows esophageal dysmotility, dilation, and narrowing  4/1 CT abd pelvis - anasarca and ascites; likely underlying liver disease  Surgeries / Procedures: see above GI recommends EGD with dilation but pt needs improved nutritional status   Central access: PICC 4/1 TPN start date: 12/22/2019>>4/10; 4/16>>  Nutritional Goals (per RD recommendation on 4/28): kCal: 1750-1950 per day, Protein: 90-105 g/day, Fluid: > 1.7 L  Current Nutrition:  TPN CLD Prostat TID - 100 cal, 15 g protein, 10 g carbs (each) x 3 yesterday Boost BreezeTID - taking consistently, each supplement provides 250kcal and 9g protein x 3 yesterday  Plan:  - Continue current TPN with inappropriate indication and cycle over 12 hours - 33 mL/hr x 1 hr, 65 mL/hr x 10 hr, then 33 mL/hr x 1 hr - provides 32g protein and 859kcal per day ($1600/d  charge) - Oral nutritional supplements are providing 1050 kcal/day and 72g protein/day, meeting ~75% of protein needs - Oral supplements and TPN meet 100% of needs, pt is also taking food as able so she is meeting >100% of nutritional needs  - TPN Lytes: continue increased Na and K.  - Monitor TPN labs Mon/Thurs - Will not reduce TPN macronutrients, though slightly overfeeding, in hopes to get to albumin 2.5 sooner.   Alanda Slim, PharmD, [facility] Clinical Pharmacist Please see AMION for all Pharmacists' Contact Phone Numbers 01/26/2020, 7:47 AM

## 2020-01-27 MED ORDER — TRAVASOL 10 % IV SOLN
INTRAVENOUS | Status: AC
  Filled 2020-01-27: qty 324

## 2020-01-27 MED ORDER — LORATADINE 10 MG PO TABS: 10.0000 mg | ORAL_TABLET | Freq: Every day | ORAL | Status: DC | PRN

## 2020-01-27 NOTE — Progress Notes (Signed)
Family Medicine Teaching Service Daily Progress Note Intern Pager: [phone]  Patient name: Deanna Cunningham Medical record number: [id number] Date of birth: [birth year] Age: 74 y.o. Gender: female  Primary Care Provider: Kristie Cowman, MD Consultants: TOC,  Code Status: Full Code   Pt Overview and Major Events to Date:  Hospital Day: 47 12/12/2019: admitted for Weakness and Abnormal Lab 12/18/19 PICC line placed  4/19-4/23 macrobid for UTI  01/22/20 albumin 2.0  Assessment and Plan: Deanna Cunningham is a 74 y.o. female who presented w/ global weakness, weight loss, failure to thrive.  Past medical history significant for malnutrition, rheumatoid arthritis (nave to care), chronic anemia, GERD.   FTT Presbyesophagus, esophageal dysmotility GERD Most recent labs on 5/10: Albumin 2.2, phosp 4.7 and TG 105. 75-100% of meals eaten yesterday and patient endorses good appetite. Continues to drink Boost x 3 and Pro-stat 6ml TID.   Plan is for EGD with dilation with GI and surgery. However, prior to this, albumin should reach 2.5 to safely proceed.  -Continue TPN and lab monitoring per pharmacy. qMon/Thurs CBC,CMP Mg, Phos. qMon prealbumin, triglyc -Appreciate dietitian recs  -Daily weights -Continue Pepcid 20 mg twice, Protonix 40 mg, sucralfate with meals and at bedtime, thiamine 100 mg daily   - PRN: Compazine q6 n/v, GI cocktail   Generalized Weakness  Likely deconditioned in setting of long hospital stay and poor nutritional status  -continue PT/OT -DME recs ordered  Bifascicular block Moderate MR/TR, stable Last EKG 12/27/2019, QTc 479 -Avoid QT prolonging medications  Anemia, stable Most recent Hb 8.5, stable  Required transfusions during this admission. -Continue CBC Mondays and Thursdays  Rheumatoid arthritis, chronic -Follows with rheumatology outpatient  FEN/GI:  . Fluids: None  . Electrolytes: Replete PRN  . Nutrition: Dysphagia 1, TPN  Access: PICC day 37 (4/1/)   VTE prophylaxis: Lovenox 30 (CrCl <30)   Disposition: SNF, however, no SNFs offering bed 2/2 TPN status.   Subjective:  Has new pruritus which started 2 days ago all over her torso and arms, she thinks its because she ate tomato soup the other day and she has allergies to tomatoes.  Objective: Temp:  [98 F (36.7 C)-98.6 F (37 C)] 98 F (36.7 C) (05/11 0428) Pulse Rate:  [83-91] 87 (05/11 0428) Resp:  [16-18] 18 (05/11 0428) BP: (102-110)/(54-69) 107/56 (05/11 0428) SpO2:  [98 %-100 %] 98 % (05/11 0428) Intake/Output      05/10 0701 - 05/11 0700 05/11 0701 - 05/12 0700   P.O. 760    I.V. (mL/kg) 665.6 (15.6)    Total Intake(mL/kg) 1425.6 (33.4)    Urine (mL/kg/hr) 4525 (4.4)    Stool 0    Total Output 4525    Net -3099.5         Urine Occurrence 1 x    Stool Occurrence 1 x        Physical Exam: General: Alert, pleasant, cachetic appearing 74 yr old female  Cardio: Normal S1 and S2, RRR Pulm: CTAB, normal WOB Abdomen: Bowel sounds normal. Abdomen soft and non-tender.  Extremities: No peripheral edema. Warm/ well perfused.  Neuro: Cranial nerves grossly intact Skin: pruritic rash over arms and torso, persistently scratching   CBC: Recent Labs  Lab 01/22/20 0306 01/26/20 0357  WBC 5.4 5.6  HGB 8.4* 8.5*  HCT 26.5* 27.8*  MCV 98.9 101.1*  PLT 241 240   CMP: Recent Labs  Lab 01/22/20 0306 01/26/20 0358  NA 137 136  K 3.9 4.0  CL 108 103  CO2 26 26  GLUCOSE 104* 104*  BUN 27* 29*  CREATININE 0.52 0.52  CALCIUM 8.3* 8.7*  MG 1.8 1.8  PHOS 4.5 4.7*  ALBUMIN 2.0* 2.2*   CBG: No results for input(s): GLUCAP in the last 168 hours. Micro: Covid Negative  No results found for this or any previous visit (from the past 240 hour(s)).   Imaging/Diagnostic Tests: No results found.  EKG Interpretation  Date/Time:  Friday December 12 2019 13:08:15 EDT Ventricular Rate:  99 PR Interval:  168 QRS Duration: 132 QT Interval:  402 QTC Calculation: 515 R  Axis:   -66 Text Interpretation: Sinus rhythm with occasional Premature ventricular complexes and Premature atrial complexes Left axis deviation Right bundle branch block Anterior infarct , age undetermined Abnormal ECG Porlonged QTc, no STEMI , similar to prior ecg pattern Confirmed by Octaviano Glow [id number]) on 12/12/2019 2:23:01 PM        Procedures:   Procedure Orders     Critical Care     ED EKG     EKG 12-Lead     EKG 12-Lead     EKG 12-Lead     EKG     EKG 12-Lead     EKG 12-Lead     EKG 12-Lead  [name], MD 01/27/2020, 7:37 AM PGY-1, [place] Intern pager: [phone], text pages welcome

## 2020-01-27 NOTE — Plan of Care (Signed)
  Problem: Education: Goal: Knowledge of General Education information will improve Description: Including pain rating scale, medication(s)/side effects and non-pharmacologic comfort measures Outcome: Progressing   Problem: Clinical Measurements: Goal: Ability to maintain clinical measurements within normal limits will improve Outcome: Progressing   Problem: Activity: Goal: Risk for activity intolerance will decrease Outcome: Progressing   Problem: Coping: Goal: Level of anxiety will decrease Outcome: Progressing   Problem: Elimination: Goal: Will not experience complications related to bowel motility Outcome: Progressing   Problem: Pain Managment: Goal: General experience of comfort will improve Outcome: Progressing   Problem: Safety: Goal: Ability to remain free from injury will improve Outcome: Progressing   Problem: Skin Integrity: Goal: Risk for impaired skin integrity will decrease Outcome: Progressing

## 2020-01-27 NOTE — Plan of Care (Signed)
  Problem: Education: Goal: Knowledge of General Education information will improve Description: Including pain rating scale, medication(s)/side effects and non-pharmacologic comfort measures Outcome: Progressing   Problem: Health Behavior/Discharge Planning: Goal: Ability to manage health-related needs will improve Outcome: Progressing   Problem: Activity: Goal: Risk for activity intolerance will decrease Outcome: Progressing   Problem: Nutrition: Goal: Adequate nutrition will be maintained Outcome: Progressing   Problem: Coping: Goal: Level of anxiety will decrease Outcome: Progressing   Problem: Elimination: Goal: Will not experience complications related to bowel motility Outcome: Progressing   Problem: Pain Managment: Goal: General experience of comfort will improve Outcome: Progressing   Problem: Skin Integrity: Goal: Risk for impaired skin integrity will decrease Outcome: Progressing   

## 2020-01-27 NOTE — Progress Notes (Signed)
PHARMACY - TOTAL PARENTERAL NUTRITION CONSULT NOTE   Indication: malnutrition  Patient Measurements: Height: 5\' 4"  (162.6 cm) Weight: 42.7 kg (94 lb 2.2 oz) IBW/kg (Calculated) : 54.7 TPN AdjBW (KG): 42.2 Body mass index is 16.16 kg/m.  Assessment: 74 yo female initially presented on 3/26 for weakness. Patient with PMH of RA, chronic anemia and GERD. She has had ongoing poor intake since Dec 2020 with LE swelling and unintentional weight loss. Pt previously on TPN from 4/5>4/10 which was stopped since pt had adequate oral intake. However, oral intake has been inconsistent, so TPN to be restarted. Known esophageal dysmotility, presbyesophagus  4/19 - per FMTS patient will remain on TPN until albumin > 2.5 and is able to have EGD with GI.  Glucose / Insulin: CBGs well controlled, SSI dc'd Electrolytes: Phos slightly high 4.7, others WNL Renal: Scr 0.52 stable, BUN 29 (last 5/10) LFTs / TGs: LFTs / Tbili / TG WNL Prealbumin / albumin: Prealbumin 9.2>>21.5, Albumin up to 2.2 Intake / Output; MIVF: UOP 4.4 ml/kg/hr, LBM 5/10 GI Imaging: 3/31 - barium swallow - unable to pass tablet - study shows esophageal dysmotility, dilation, and narrowing  4/1 CT abd pelvis - anasarca and ascites; likely underlying liver disease Surgeries / Procedures: see above GI recommends EGD with dilation but pt needs improved nutritional status   Central access: PICC 4/1 TPN start date: 12/22/2019>>4/10; 4/16>>  Nutritional Goals (per RD recommendation on 5/5): kCal: 1750-1950 per day, Protein: 90-105 g/day, Fluid: > 1.7 L  Current Nutrition:  TPN Dysphagia 1 diet - good appetite per patient, 75-100% meals charted yesterday Prostat TID - 100 kcal, 15 g protein (each) x 3 yesterday Boost Breeze TID - taking consistently, each supplement provides 250kcal and 9g protein x 3 yesterday  Plan:  - Continue cyclic TPN. Cycle 720 ml over 12 hrs - 33 mL/hr x 1 hr, 65 mL/hr x 10 hr, then 33 mL/hr x 1 hr - provides 32g  protein and 859kcal per day. Note, TPN indication not supported by ASPEN guideline recommendations ($1600/d charge) - Oral nutritional supplements are providing 1050 kcal/day and 72g protein/day, meeting ~75% of protein needs - Oral supplements and TPN meet 100% of needs. Pt also taking in food as able, so currently meeting >100% of nutritional needs  - TPN Lytes: continue increased Na 125 mEq/L, standard K / Ca, Mag 15 mEq/L, reduce Phos to 15 mmol/L; Cl:Ac 1:1 - Continue daily PO multivitamin - Monitor TPN labs Mon/Thurs - Will not reduce TPN macronutrients, though slightly overfeeding, in attempt to get to albumin 2.5 sooner.   Arturo Morton, PharmD, BCPS Please check AMION for all [place] contact numbers Clinical Pharmacist 01/27/2020 9:14 AM

## 2020-01-27 NOTE — Plan of Care (Signed)

## 2020-01-28 MED ORDER — TRAVASOL 10 % IV SOLN
INTRAVENOUS | Status: AC
  Filled 2020-01-28: qty 324

## 2020-01-28 NOTE — Progress Notes (Signed)
PHARMACY - TOTAL PARENTERAL NUTRITION CONSULT NOTE   Indication: malnutrition  Patient Measurements: Height: 5\' 4"  (162.6 cm) Weight: 42.7 kg (94 lb 2.2 oz) IBW/kg (Calculated) : 54.7 TPN AdjBW (KG): 42.2 Body mass index is 16.16 kg/m.  Assessment: 74 yo female initially presented on 3/26 for weakness. Patient with PMH of RA, chronic anemia and GERD. She has had ongoing poor intake since Dec 2020 with LE swelling and unintentional weight loss. Pt previously on TPN from 4/5>4/10 which was stopped since pt had adequate oral intake. However, oral intake has been inconsistent, so TPN to be restarted. Known esophageal dysmotility, presbyesophagus  4/19 - per FMTS patient will remain on TPN until albumin > 2.5 and is able to have EGD with GI.  Glucose / Insulin: CBGs well controlled, SSI dc'd Electrolytes: Phos slightly high 4.7, others WNL Renal: Scr 0.52 stable, BUN 29 LFTs / TGs: LFTs / Tbili / TG WNL Prealbumin / albumin: Prealbumin 9.2>>21.5, Albumin up to 2.2 Intake / Output; MIVF: UOP 3.3 ml/kg/hr, LBM 5/10 GI Imaging: 3/31 - barium swallow - unable to pass tablet - study shows esophageal dysmotility, dilation, and narrowing  4/1 CT abd pelvis - anasarca and ascites; likely underlying liver disease Surgeries / Procedures: see above GI recommends EGD with dilation but pt needs improved nutritional status   Central access: PICC 4/1 TPN start date: 12/22/2019>>4/10; 4/16>>  Nutritional Goals (per RD recommendation on 5/5): kCal: 1750-1950 per day, Protein: 90-105 g/day, Fluid: > 1.7 L  Current Nutrition:  TPN Dysphagia 1 diet - good appetite per patient, 75-100% meals charted yesterday Prostat TID - 100 kcal, 15 g protein (each) x 3 yesterday Boost Breeze TID - taking consistently, each supplement provides 250kcal and 9g protein x 3 yesterday  Plan:  - Continue cyclic TPN. Cycle 720 ml over 12 hrs - 33 mL/hr x 1 hr, 65 mL/hr x 10 hr, then 33 mL/hr x 1 hr - provides 32g protein and  859kcal per day. Note, TPN indication not supported by ASPEN guideline recommendations ($1600/d charge) - Oral nutritional supplements are providing 1050 kcal/day and 72g protein/day, meeting ~75% of protein needs - Oral supplements and TPN meet 100% of needs. Pt also taking in food as able, so currently meeting >100% of nutritional needs  - TPN Lytes: continue increased Na 125 mEq/L, standard K / Ca, Mag 15 mEq/L, reduce Phos to 15 mmol/L; Cl:Ac 1:1 - Continue daily PO multivitamin - Monitor TPN labs Mon/Thurs - Will not reduce TPN macronutrients, though slightly overfeeding, in attempt to get to albumin 2.5 sooner.   Arturo Morton, PharmD, BCPS Please check AMION for all [place] contact numbers Clinical Pharmacist 01/28/2020 7:51 AM

## 2020-01-28 NOTE — Progress Notes (Signed)
Occupational Therapy Treatment Patient Details Name: Deanna Cunningham MRN: [id number] DOB: 1945-10-20 Today's Date: 01/28/2020    History of present illness Pt is a 74 y.o. female admitted 12/12/19 with progressive weakness, poor PO intake and BLE swelling; worked up for malnutrition (concern for malignancy). Admitted with severe protein calorie malnutrition In the setting of known esophageal dysmotility. Pt also with anemia, electrolyte imbalance, stage 1 sacral wound. Pt with recurrent asymptomatic SVT; may occur in the settings of severe malnutrition and electrolyte imbalance. PMH includes dysphagia, CKD III, anemia, RA.   OT comments  Pt eating lunch upon OT arrival, session focused on pt's independence with eating and opening containers as well as education on benefit and importance of self advocating during hospital stay. Pt reports she has been participating more independently in ADL (bathing and grooming at sink level. Educated pt on additional areas to continue to maximize independence. Pt demonstrated great improvement with fine motor skills this session and is now modified independent with self-feeding. Pt is able to open containers with increased time and effort. Pt will continue to benefit from skilled OT services to maximize safety and independence with ADL/IADL and functional mobility. Will continue to follow acutely and progress as tolerated.     Follow Up Recommendations  SNF    Equipment Recommendations  3 in 1 bedside commode;Tub/shower bench    Recommendations for Other Services      Precautions / Restrictions Precautions Precautions: Fall;Other (comment) Precaution Comments: Bilateral hand/finger RA, sacral wound, rectal prolapse (occurred 4/16) Restrictions Weight Bearing Restrictions: No       Mobility Bed Mobility                  Transfers                 General transfer comment: pt requested to continue eating    Balance                                           ADL either performed or assessed with clinical judgement   ADL Overall ADL's : Needs assistance/impaired Eating/Feeding: Sitting;Modified independent Eating/Feeding Details (indicate cue type and reason): pt demonstrated ability to open containters with modified independence this session requiring increased time and effort                                   General ADL Comments: continued to reinforce importance of independence with participation during ADL tasks;     Vision       Perception     Praxis      Cognition Arousal/Alertness: Awake/alert Behavior During Therapy: WFL for tasks assessed/performed Overall Cognitive Status: Within Functional Limits for tasks assessed                                 General Comments: educated pt on importance of self advocation and importance of engaging in ADL with increased independence;educated pt on benefits of frequently getting out of bed, encouraged pt to get out of bed at least 3x/day        Exercises Exercises: Other exercises Other Exercises Other Exercises: pt verbally reported correct engagement of general UE exercises and fine motor exercises Other Exercises: educated pt on general UE and LE  exercises to complete while in bed/chair   Shoulder Instructions       General Comments vss    Pertinent Vitals/ Pain       Pain Assessment: No/denies pain  Home Living                                          Prior Functioning/Environment              Frequency  Min 2X/week        Progress Toward Goals  OT Goals(current goals can now be found in the care plan section)  Progress towards OT goals: Progressing toward goals  Acute Rehab OT Goals Patient Stated Goal: pt wanting to go home now OT Goal Formulation: With patient Time For Goal Achievement: 02/11/20 Potential to Achieve Goals: Good ADL Goals Pt Will Perform  Grooming: with modified independence;standing Pt Will Perform Upper Body Bathing: with modified independence;standing;sitting Pt Will Perform Lower Body Bathing: with modified independence;sit to/from stand Pt Will Perform Upper Body Dressing: with modified independence;sitting Pt Will Perform Lower Body Dressing: with modified independence;sit to/from stand Pt Will Transfer to Toilet: with modified independence;ambulating;regular height toilet Pt Will Perform Toileting - Clothing Manipulation and hygiene: with modified independence;sit to/from stand Pt/caregiver will Perform Home Exercise Program: Increased strength;Right Upper extremity;Left upper extremity;With Supervision;With written HEP provided  Plan Discharge plan remains appropriate    Co-evaluation                 AM-PAC OT "6 Clicks" Daily Activity     Outcome Measure   Help from another person eating meals?: None Help from another person taking care of personal grooming?: A Little Help from another person toileting, which includes using toliet, bedpan, or urinal?: A Little Help from another person bathing (including washing, rinsing, drying)?: A Little Help from another person to put on and taking off regular upper body clothing?: A Little Help from another person to put on and taking off regular lower body clothing?: A Little 6 Click Score: 19    End of Session    OT Visit Diagnosis: Unsteadiness on feet (R26.81);Repeated falls (R29.6);Muscle weakness (generalized) (M62.81)   Activity Tolerance Patient tolerated treatment well   Patient Left in bed;with call bell/phone within reach;with bed alarm set;with nursing/sitter in room   Nurse Communication Mobility status        Time: [id number] OT Time Calculation (min): 11 min  Charges: OT General Charges $OT Visit: 1 Visit OT Treatments $Self Care/Home Management : 8-22 mins  Helene Kelp OTR/L Acute Rehabilitation Services Office: [place] 01/28/2020, 3:31 PM

## 2020-01-28 NOTE — Progress Notes (Signed)
Nutrition Follow-up  RD working remotely.  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  Ongoing  GOAL:   Patient will meet greater than or equal to 90% of their needs  Met with TPN, meals, and supplements  MONITOR:   PO intake, Supplement acceptance, Diet advancement, Labs, Weight trends, Skin, I & O's  REASON FOR ASSESSMENT:   Consult Assessment of nutrition requirement/status  ASSESSMENT:   Deanna Cunningham is a 74 y.o. female presenting with weakness. PMH is significant for Rheumatoid Arthritis (no tx, no rheumatologist), chronic anemia and GERD.  4/2- PICC placed 4/5- TPN initiated 4/10- TPN d/c 4/17- TPN restarted due to nausea, vomiting, poor oral intake 4/20- transitioned to cyclic TPN  Reviewed I/O's: -2.4 L x 24 hours and -20.7 L since 01/14/20  UOP: 3.3 L x 24 hours  Attempted to speak with pt via phone, however, no answer.   Per MD notes, plan to continue to TPN until albumin improves. Once albumin has improved, plan to proceed with EGD with dilation with GI. Pt may also require PEG tube in the future if dilation is not successful.GI re-evaluated pt on 01/22/20; plan to continue TPN and reassess when albumin is at least 2.5.   Pt remains in dysphagia 1 diet, however, consuming mostly liquids of minimal nutritional density due to severe stricture (meal trays consists of cream soups,  broths, juices, and applesauce). Reviewed meal completion records over the past week; pt is consuming approximately 507 kcals and 10 grams protein daily off meal , meeting 29% of estimated kcal needs and 11% of protein needs. Pt continues to consume Boost Breeze and Prostat supplements. Average daily PO intake over the past week (meals and supplements) is 1557 kcals and 82 grams protein, meeting 89% of estimated kcal needs and 91% of estimated protein needs.   Pt remains on cyclic TPN (183 ml over 12 hrs - 33 mL/hr x 1 hr, 65 mL/hr x 10 hr, then 33 mL/hr x 1 hr); regimen provides 859 kcals and 32 grams protein daily, meeting 49% of estimated kcal needs and 36% of estimated protein needs. Average nutritional intake via meals, supplements and TPN is 2416 kcals and 114 grams protein, meeting >100% of estimated nutritional needs.   Wt has been stable since admission.  Pt awaiting SNF placement, however, difficult placement secondary to TPN requirements.   Labs reviewed.   Diet Order:   Diet Order            DIET - DYS 1 Room service appropriate? Yes; Fluid consistency: Thin  Diet effective now              EDUCATION NEEDS:   Education needs have been addressed  Skin:  Skin Assessment: Skin Integrity Issues: Skin Integrity Issues:: Stage II Stage II: coccyx- epithelialized Other: MASD to groin, labia, buttocks, and sacrum  Last BM:  01/26/20  Height:   Ht Readings from Last 1 Encounters:  01/17/20 5' 4" (1.626 m)  Weight:   Wt Readings from Last 1 Encounters:  01/25/20 42.7 kg    Ideal Body Weight:  54.5 kg  BMI:  Body mass index is 16.16 kg/m.  Estimated Nutritional Needs:   Kcal:  9800-1239  Protein:  90-105 grams  Fluid:  > 1.7 L    [name], RD, LDN, [place] Registered Dietitian II Certified Diabetes Care and Education Specialist Please refer to [facility] for RD and/or RD on-call/weekend/after hours pager

## 2020-01-28 NOTE — Progress Notes (Signed)
Family Medicine Teaching Service Daily Progress Note Intern Pager: [phone]  Patient name: Deanna Cunningham Medical record number: [id number] Date of birth: [birth year] Age: 74 y.o. Gender: female  Primary Care Provider: Kristie Cowman, MD Consultants: TOC,  Code Status: Full Code   Pt Overview and Major Events to Date:  Hospital Day: 48 12/12/2019: admitted for Weakness and Abnormal Lab 12/18/19 PICC line placed  4/19-4/23 macrobid for UTI  01/22/20 albumin 2.0  Assessment and Plan: Deanna Cunningham is a 74 y.o. female who presented w/ global weakness, weight loss, failure to thrive.  Past medical history significant for malnutrition, rheumatoid arthritis (nave to care), chronic anemia, GERD.   FTT Presbyesophagus, esophageal dysmotility GERD Most recent labs on 5/10: Albumin 2.2, phosp 4.7 and TG 105. 100% of meals eaten yesterday and patient endorses good appetite. Continues to drink Boost x 3 and Pro-stat 83ml TID.   Plan is for EGD with dilation with GI and surgery. However, prior to this, albumin should reach 2.5 to safely proceed.  -Continue TPN and lab monitoring per pharmacy. qMon/Thurs CBC,CMP Mg, Phos. qMon prealbumin, triglyc -Appreciate dietitian recs  -Daily weights -Continue Pepcid 20 mg twice, Protonix 40 mg, sucralfate with meals and at bedtime, thiamine 100 mg daily   - PRN: Compazine q6 n/v, GI cocktail   Generalized Weakness  Likely deconditioned in setting of long hospital stay and poor nutritional status  -continue PT/OT -DME recs ordered  Bifascicular block Moderate MR/TR, stable Last EKG 12/27/2019, QTc 479 -Avoid QT prolonging medications  Anemia, stable Most recent Hb 8.5, stable  Required transfusions during this admission. -Continue CBC Mondays and Thursdays  Rheumatoid arthritis, chronic -Follows with rheumatology outpatient  FEN/GI:  . Fluids: None  . Electrolytes: Replete PRN  . Nutrition: Dysphagia 1, TPN  Access: PICC day 37 (4/1/)   VTE prophylaxis: Lovenox 30 (CrCl <30)   Disposition: SNF, however, no SNFs offering bed 2/2 TPN status.   Subjective:  Doing well, no acute concerns, pruritus improved.  Objective: Temp:  [97.9 F (36.6 C)-98.2 F (36.8 C)] 98.2 F (36.8 C) (05/12 0330) Pulse Rate:  [94-103] 103 (05/12 0330) Resp:  [16-18] 18 (05/12 0330) BP: (104-126)/(61-69) 126/69 (05/12 0330) SpO2:  [99 %-100 %] 99 % (05/12 0330) Intake/Output      05/11 0701 - 05/12 0700 05/12 0701 - 05/13 0700   P.O. 240    I.V. (mL/kg) 713.1 (16.7)    Total Intake(mL/kg) 953.1 (22.3)    Urine (mL/kg/hr) 3345 (3.3)    Stool     Total Output 3345    Net -2391.9             Physical Exam: General: Alert, 74 year old female sitting up in bed, cachectic Cardio: Normal S1 and S2,  No murmurs or rubs.   Pulm: CTAB, normal work of breathing Abdomen: Bowel sounds normal. Abdomen soft and non-tender.  Extremities: No peripheral edema. Warm/ well perfused.   Neuro: Cranial nerves grossly intact  CBC: Recent Labs  Lab 01/22/20 0306 01/26/20 0357  WBC 5.4 5.6  HGB 8.4* 8.5*  HCT 26.5* 27.8*  MCV 98.9 101.1*  PLT 241 240   CMP: Recent Labs  Lab 01/22/20 0306 01/26/20 0358  NA 137 136  K 3.9 4.0  CL 108 103  CO2 26 26  GLUCOSE 104* 104*  BUN 27* 29*  CREATININE 0.52 0.52  CALCIUM 8.3* 8.7*  MG 1.8 1.8  PHOS 4.5 4.7*  ALBUMIN 2.0* 2.2*   CBG: No results for input(s): GLUCAP  in the last 168 hours. Micro: Covid Negative  No results found for this or any previous visit (from the past 240 hour(s)).   Imaging/Diagnostic Tests: No results found.  EKG Interpretation  Date/Time:  Friday December 12 2019 13:08:15 EDT Ventricular Rate:  99 PR Interval:  168 QRS Duration: 132 QT Interval:  402 QTC Calculation: 515 R Axis:   -66 Text Interpretation: Sinus rhythm with occasional Premature ventricular complexes and Premature atrial complexes Left axis deviation Right bundle branch block Anterior infarct , age  undetermined Abnormal ECG Porlonged QTc, no STEMI , similar to prior ecg pattern Confirmed by Octaviano Glow [phone]) on 12/12/2019 2:23:01 PM        Procedures:   Procedure Orders     Critical Care     ED EKG     EKG 12-Lead     EKG 12-Lead     EKG 12-Lead     EKG     EKG 12-Lead     EKG 12-Lead     EKG 12-Lead  [name], MD 01/28/2020, 8:07 AM PGY-1, [place] Intern pager: [phone], text pages welcome

## 2020-01-28 NOTE — TOC Progression Note (Signed)
Transition of Care [facility]) - Progression Note    Patient Details  Name: Deanna Cunningham MRN: [id number] Date of Birth: 1945-09-27  Transition of Care [facility]) CM/SW Contact  Sharin Mons, RN Phone Number: 01/28/2020, 10:55 AM  Clinical Narrative:    Pt without SNF bed offers. Search expanded to [facility] and [facility] [place]. Awaiting response from [facility]. TOC team will continue to monitor and follow...   Expected Discharge Plan: [place] Barriers to Discharge: Continued Medical Work up, No SNF bed  Expected Discharge Plan and Services Expected Discharge Plan: [place]   Discharge Planning Services: CM Consult   Living arrangements for the past 2 months: Single Family Home                                       Social Determinants of Health (SDOH) Interventions    Readmission Risk Interventions No flowsheet data found.

## 2020-01-29 LAB — COMPREHENSIVE METABOLIC PANEL
ALT: 12 U/L (ref 0–44)
AST: 14 U/L — ABNORMAL LOW (ref 15–41)
Albumin: 2.5 g/dL — ABNORMAL LOW (ref 3.5–5.0)
Alkaline Phosphatase: 57 U/L (ref 38–126)
Anion gap: 5 (ref 5–15)
BUN: 30 mg/dL — ABNORMAL HIGH (ref 8–23)
CO2: 27 mmol/L (ref 22–32)
Calcium: 9 mg/dL (ref 8.9–10.3)
Chloride: 104 mmol/L (ref 98–111)
Creatinine, Ser: 0.58 mg/dL (ref 0.44–1.00)
GFR calc Af Amer: >60 mL/min (ref >60)
GFR calc non Af Amer: >60 mL/min (ref >60)
Glucose, Bld: 102 mg/dL — ABNORMAL HIGH (ref 70–99)
Potassium: 4.3 mmol/L (ref 3.5–5.1)
Sodium: 136 mmol/L (ref 135–145)
Total Bilirubin: <0.1 mg/dL — ABNORMAL LOW (ref 0.3–1.2)
Total Protein: 6.7 g/dL (ref 6.5–8.1)

## 2020-01-29 LAB — CBC
HCT: 26.8 % — ABNORMAL LOW (ref 36.0–46.0)
Hemoglobin: 8.3 g/dL — ABNORMAL LOW (ref 12.0–15.0)
MCH: 30.5 pg (ref 26.0–34.0)
MCHC: 31 g/dL (ref 30.0–36.0)
MCV: 98.5 fL (ref 80.0–100.0)
Platelets: 222 10*3/uL (ref 150–400)
RBC: 2.72 MIL/uL — ABNORMAL LOW (ref 3.87–5.11)
RDW: 14.7 % (ref 11.5–15.5)
WBC: 4.6 10*3/uL (ref 4.0–10.5)
nRBC: 0 % (ref 0.0–0.2)

## 2020-01-29 LAB — MAGNESIUM: Magnesium: 1.9 mg/dL (ref 1.7–2.4)

## 2020-01-29 LAB — PHOSPHORUS: Phosphorus: 4.4 mg/dL (ref 2.5–4.6)

## 2020-01-29 MED ORDER — TRAVASOL 10 % IV SOLN
INTRAVENOUS | Status: AC
  Filled 2020-01-29: qty 322.2

## 2020-01-29 NOTE — Plan of Care (Signed)

## 2020-01-29 NOTE — Progress Notes (Signed)
Physical Therapy Treatment Patient Details Name: RIKKI-LEE CORCORAN MRN: [id number] DOB: June 27, 1946 Today's Date: 01/29/2020    History of Present Illness Pt is a 74 y.o. female admitted 12/12/19 with progressive weakness, poor PO intake and BLE swelling; worked up for malnutrition (concern for malignancy). Admitted with severe protein calorie malnutrition In the setting of known esophageal dysmotility. Pt also with anemia, electrolyte imbalance, stage 1 sacral wound. Pt with recurrent asymptomatic SVT; may occur in the settings of severe malnutrition and electrolyte imbalance. PMH includes dysphagia, CKD III, anemia, RA.    PT Comments    Pt supine in bed on arrival.  Pt required assistance to rise into standing without device.  Pt required assistance to maintain and correct balance.  Pt remains to benefit from post acute SNF placement for rehab to improve strength and function before returning home.     Follow Up Recommendations  SNF;Supervision/Assistance - 24 hour     Equipment Recommendations  3in1 (PT)    Recommendations for Other Services       Precautions / Restrictions Precautions Precautions: Fall;Other (comment) Precaution Comments: Bilateral hand/finger RA, sacral wound, rectal prolapse (occurred 4/16) Restrictions Weight Bearing Restrictions: No    Mobility  Bed Mobility Overal bed mobility: Modified Independent Bed Mobility: Supine to Sit     Supine to sit: Modified independent (Device/Increase time)     General bed mobility comments: bed flat and no assistance.  Transfers Overall transfer level: Needs assistance Equipment used: None Transfers: Sit to/from Stand Sit to Stand: Min assist         General transfer comment: Pt required assistance to rise into standing.  No device used this session.  Ambulation/Gait Ambulation/Gait assistance: Min assist;Mod assist Gait Distance (Feet): 40 Feet(+ 30 ft) Assistive device: None Gait Pattern/deviations:  Step-through pattern;Decreased stride length;Narrow base of support;Decreased dorsiflexion - left;Staggering right;Staggering left;Ataxic;Scissoring;Decreased dorsiflexion - right     General Gait Details: Pt with flexed posture and L LE scissors to mid line.  Required min to mod assistance to correct LOB without the RW.  As gt progressed she required min assistance.  Noted wioth LOB x 3 with turn to L.  Required moderate assistance to correct.   Stairs             Wheelchair Mobility    Modified Rankin (Stroke Patients Only)       Balance Overall balance assessment: Needs assistance Sitting-balance support: Feet supported;No upper extremity supported Sitting balance-Leahy Scale: Good       Standing balance-Leahy Scale: Poor                              Cognition Arousal/Alertness: Awake/alert Behavior During Therapy: WFL for tasks assessed/performed Overall Cognitive Status: Within Functional Limits for tasks assessed                                        Exercises General Exercises - Lower Extremity Long Arc Quad: AROM;Both;Seated;15 reps Heel Slides: AROM;Both;10 reps;Supine Hip ABduction/ADduction: AROM;Both;10 reps;Supine Straight Leg Raises: AROM;Both;10 reps;Supine;Limitations Straight Leg Raises Limitations: extensor lag on L. Hip Flexion/Marching: AROM;Both;15 reps;Seated    General Comments        Pertinent Vitals/Pain Pain Assessment: No/denies pain Faces Pain Scale: Hurts a little bit Pain Location: L Foot at pressure sore Pain Descriptors / Indicators: Guarding Pain Intervention(s): Monitored during session;Repositioned  Home Living                      Prior Function            PT Goals (current goals can now be found in the care plan section) Acute Rehab PT Goals Patient Stated Goal: pt wanting to go home now Potential to Achieve Goals: Fair Progress towards PT goals: Progressing toward  goals    Frequency    Min 2X/week      PT Plan Current plan remains appropriate    Co-evaluation              AM-PAC PT "6 Clicks" Mobility   Outcome Measure  Help needed turning from your back to your side while in a flat bed without using bedrails?: None Help needed moving from lying on your back to sitting on the side of a flat bed without using bedrails?: None Help needed moving to and from a bed to a chair (including a wheelchair)?: A Little Help needed standing up from a chair using your arms (e.g., wheelchair or bedside chair)?: A Little Help needed to walk in hospital room?: A Little Help needed climbing 3-5 steps with a railing? : A Little 6 Click Score: 20    End of Session Equipment Utilized During Treatment: Gait belt Activity Tolerance: Patient tolerated treatment well;Patient limited by fatigue Patient left: with call bell/phone within reach;in chair;with chair alarm set Nurse Communication: Mobility status PT Visit Diagnosis: Other abnormalities of gait and mobility (R26.89);Muscle weakness (generalized) (M62.81);Difficulty in walking, not elsewhere classified (R26.2) Pain - Right/Left: (Bilateral) Pain - part of body: Leg;Ankle and joints of foot     Time: [id number] PT Time Calculation (min) (ACUTE ONLY): 19 min  Charges:  $Gait Training: 8-22 mins                     Erasmo Leventhal , PTA Acute Rehabilitation Services Pager [phone] Office [place] 01/29/2020, 5:04 PM

## 2020-01-29 NOTE — Progress Notes (Addendum)
Telephone call to Dr Benson Norway (GI) and Dr Collene Mares who conveyed similar messages. They had been in contact with Ms Deanna Cunningham's brother today who does not want to proceed with EGD despite GIs recommendation that it will be safe with an albumin of 2.5. He would like to wait until the albumin 3.5-5.5.  [name] MD PGY-1, [place]

## 2020-01-29 NOTE — Plan of Care (Signed)
  Problem: Education: Goal: Knowledge of General Education information will improve Description: Including pain rating scale, medication(s)/side effects and non-pharmacologic comfort measures Outcome: Progressing   Problem: Nutrition: Goal: Adequate nutrition will be maintained Outcome: Not Progressing   Problem: Skin Integrity: Goal: Risk for impaired skin integrity will decrease Outcome: Progressing

## 2020-01-29 NOTE — Progress Notes (Signed)
PHARMACY - TOTAL PARENTERAL NUTRITION CONSULT NOTE   Indication: malnutrition  Patient Measurements: Height: 5\' 4"  (162.6 cm) Weight: 42.7 kg (94 lb 2.2 oz) IBW/kg (Calculated) : 54.7 TPN AdjBW (KG): 42.2 Body mass index is 16.16 kg/m.  Assessment: 74 yo female initially presented on 3/26 for weakness. Patient with PMH of RA, chronic anemia and GERD. She has had ongoing poor intake since Dec 2020 with LE swelling and unintentional weight loss. Pt previously on TPN from 4/5>4/10 which was stopped since pt had adequate oral intake. However, oral intake has been inconsistent, so TPN to be restarted. Known esophageal dysmotility, presbyesophagus  4/19 - per FMTS patient will remain on TPN until albumin > 2.5 and is able to have EGD with GI.  Glucose / Insulin: CBGs well controlled, SSI dc'd Electrolytes: all WNL Renal: Scr stable, BUN 30 LFTs / TGs: LFTs / Tbili / TG WNL Prealbumin / albumin: Prealbumin 9.2>>21.5, Albumin up to 2.5 Intake / Output; MIVF: UOP 2.7 ml/kg/hr, LBM 5/10 GI Imaging: 3/31 - barium swallow - unable to pass tablet - study shows esophageal dysmotility, dilation, and narrowing  4/1 CT abd pelvis - anasarca and ascites; likely underlying liver disease Surgeries / Procedures: see above GI recommends EGD with dilation but pt needs improved nutritional status   Central access: PICC 4/1 TPN start date: 12/22/2019>>4/10; 4/16>>  Nutritional Goals (per RD recommendation on 5/5): kCal: 1750-1950 per day, Protein: 90-105 g/day, Fluid: > 1.7 L  Current Nutrition:  TPN Dysphagia 1 diet - good appetite per patient, 100% meals charted yesterday Prostat TID - 100 kcal, 15 g protein (each) x 3 yesterday Boost Breeze TID - taking consistently, each supplement provides 250kcal and 9g protein x 3 yesterday  Plan:  - Continue cyclic TPN. Cycle 720 ml over 12 hrs - 33 mL/hr x 1 hr, 65 mL/hr x 10 hr, then 33 mL/hr x 1 hr - provides 32g protein and 859kcal per day. Note, TPN  indication not supported by ASPEN guideline recommendations ($1600/d charge) - Oral nutritional supplements are providing 1050 kcal/day and 72g protein/day, meeting ~75% of protein needs - Oral supplements and TPN meet 100% of needs. Pt also taking in food as able, so currently meeting >100% of nutritional needs  - TPN Lytes: no changes - Continue daily PO multivitamin - Monitor TPN labs Mon/Thurs - Will not reduce TPN macronutrients, though slightly overfeeding, in attempt to get to albumin 2.5 sooner - F/U stopping TPN: albumin = 2.5   Vallery Sa, PharmD, BCPS Please check AMION for all [place] contact numbers Clinical Pharmacist 01/29/2020 8:24 AM

## 2020-01-29 NOTE — Progress Notes (Signed)
Family Medicine Teaching Service Daily Progress Note Intern Pager: [phone]  Patient name: Deanna Cunningham Medical record number: [id number] Date of birth: [birth year] Age: 74 y.o. Gender: female  Primary Care Provider: Kristie Cowman, MD Consultants: TOC,  Code Status: Full Code   Pt Overview and Major Events to Date:  Hospital Day: 49 12/12/2019: admitted for Weakness and Abnormal Lab 12/18/19 PICC line placed  4/19-4/23 macrobid for UTI  01/22/20 albumin 2.0  Assessment and Plan: Deanna Cunningham is a 74 y.o. female who presented w/ global weakness, weight loss, failure to thrive.  Past medical history significant for malnutrition, rheumatoid arthritis (nave to care), chronic anemia, GERD.   FTT Presbyesophagus, esophageal dysmotility GERD Most recent labs on 5/10: Albumin 2.5, phosp 4.4, Mg 1.9. 20-100% of meals eaten yesterday. Continues to drink Boost x 3 and Pro-stat 4ml TID.   -Continue TPN and lab monitoring per pharmacy. qMon/Thurs CBC,CMP Mg, Phos. qMon prealbumin, triglyc -Appreciate dietitian recs  -Daily weights -Continue Pepcid 20 mg twice, Protonix 40 mg, sucralfate with meals and at bedtime, thiamine 100 mg daily   - PRN: Compazine q6 n/v, GI cocktail  -Contact GI regarding EGD  Generalized Weakness  Likely deconditioned in setting of long hospital stay and poor nutritional status  -continue PT/OT -DME recs ordered  Bifascicular block Moderate MR/TR, stable Last EKG 12/27/2019, QTc 479 -Avoid QT prolonging medications  Anemia, stable Most recent Hb 8.5, stable  Required transfusions during this admission. -Continue CBC Mondays and Thursdays  Rheumatoid arthritis, chronic -Follows with rheumatology outpatient  FEN/GI:  . Fluids: None  . Electrolytes: Replete PRN  . Nutrition: Dysphagia 1, TPN  Access: PICC day 37 (4/1/)  VTE prophylaxis: Lovenox 30 (CrCl <30)   Disposition: SNF, however, no SNFs offering bed 2/2 TPN status.   Subjective:  No  acute concerns overnight.   Objective: Temp:  [98.3 F (36.8 C)-98.9 F (37.2 C)] 98.4 F (36.9 C) (05/13 0418) Pulse Rate:  [76-94] 94 (05/13 0418) Resp:  [14-16] 16 (05/13 0418) BP: (99-117)/(53-67) 99/53 (05/13 0418) SpO2:  [100 %] 100 % (05/13 0418) Intake/Output      05/12 0701 - 05/13 0700 05/13 0701 - 05/14 0700   P.O. 880    I.V. (mL/kg) 577.1 (13.5)    Total Intake(mL/kg) 1457.1 (34.1)    Urine (mL/kg/hr) 2750 (2.7)    Total Output 2750    Net -1293             Physical Exam: General: Alert, no acute distress, frail appearing female  Cardio: Normal S1 and S2, RRR. Pulm: CTAB, normal WOB  Abdomen: Bowel sounds normal. Abdomen soft and non-tender.  Extremities: No peripheral edema. Warm/ well perfused.   Neuro: Cranial nerves grossly intact  CBC: Recent Labs  Lab 01/26/20 0357 01/29/20 0418  WBC 5.6 4.6  HGB 8.5* 8.3*  HCT 27.8* 26.8*  MCV 101.1* 98.5  PLT 240 222   CMP: Recent Labs  Lab 01/26/20 0358 01/29/20 0418  NA 136 136  K 4.0 4.3  CL 103 104  CO2 26 27  GLUCOSE 104* 102*  BUN 29* 30*  CREATININE 0.52 0.58  CALCIUM 8.7* 9.0  MG 1.8 1.9  PHOS 4.7* 4.4  ALBUMIN 2.2* 2.5*   CBG: No results for input(s): GLUCAP in the last 168 hours. Micro: Covid Negative  No results found for this or any previous visit (from the past 240 hour(s)).   Imaging/Diagnostic Tests: No results found.  EKG Interpretation  Date/Time:  Friday December 12 2019 13:08:15 EDT Ventricular Rate:  99 PR Interval:  168 QRS Duration: 132 QT Interval:  402 QTC Calculation: 515 R Axis:   -66 Text Interpretation: Sinus rhythm with occasional Premature ventricular complexes and Premature atrial complexes Left axis deviation Right bundle branch block Anterior infarct , age undetermined Abnormal ECG Porlonged QTc, no STEMI , similar to prior ecg pattern Confirmed by Octaviano Glow [phone]) on 12/12/2019 2:23:01 PM        Procedures:   Procedure Orders     Critical  Care     ED EKG     EKG 12-Lead     EKG 12-Lead     EKG 12-Lead     EKG     EKG 12-Lead     EKG 12-Lead     EKG 12-Lead  [name], MD 01/29/2020, 8:40 AM PGY-1, [place] Intern pager: [phone], text pages welcome

## 2020-01-29 NOTE — TOC Progression Note (Addendum)
Transition of Care [facility]) - Progression Note    Patient Details  Name: Deanna Cunningham MRN: [id number] Date of Birth: [birth year]  [facility]) CM/SW Contact  Sharin Mons, RN Phone Number: 01/29/2020, 9:43 AM  Clinical Narrative:    NCM received call from [place] with SNF bed offer. [facility] stated they will accept  pt receiving TPN 3 days a week, hoping to wean. NCM made pt aware. Pt then shared text message from brother( MD)" don't let them rush you , your albumin needs to be a 3 ..." Voice message left with daughter to share bed offer . NCM paged MD to make aware...awaiting response. TOC team will continue to monitor.    Expected Discharge Plan: [place] Barriers to Discharge: Continued Medical Work up  Expected Discharge Plan and Services Expected Discharge Plan: [place]   Discharge Planning Services: CM Consult   Living arrangements for the past 2 months: Single Family Home                                       Social Determinants of Health (SDOH) Interventions    Readmission Risk Interventions No flowsheet data found.

## 2020-01-30 ENCOUNTER — Inpatient Hospital Stay: Payer: BC Managed Care – PPO | Admitting: Oncology

## 2020-01-30 ENCOUNTER — Inpatient Hospital Stay: Payer: BC Managed Care – PPO

## 2020-01-30 MED ORDER — TRAVASOL 10 % IV SOLN
INTRAVENOUS | Status: DC
  Filled 2020-01-30: qty 322.2

## 2020-01-30 NOTE — Progress Notes (Signed)
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  Ongoing  GOAL:   Patient will meet greater than or equal to 90% of their needs  Met with TPN, meals, and supplements  MONITOR:   PO intake, Supplement acceptance, Diet advancement, Labs, Weight trends, Skin, I & O's  REASON FOR ASSESSMENT:   Consult Assessment of nutrition requirement/status  ASSESSMENT:   Deanna Cunningham is a 74 y.o. female presenting with weakness. PMH is significant for Rheumatoid Arthritis (no tx, no rheumatologist), chronic anemia and GERD.  4/2- PICC placed 4/5- TPN initiated 4/10- TPN d/c 4/17- TPN restarted due to nausea, vomiting, poor oral intake 4/20- transitioned to cyclic TPN  Reviewed I/O's: -3.2 L x 24 hours and -23.3 L since 4.30/21  UOP: 4.4 L x 24 hours  Per MD notes, plan to continue to TPN until albumin improves. Once albumin has improved, plan to proceed with EGD with dilation with GI. Pt may also require PEG tube in the future if dilation is not successful.GI re-evaluated pt on 01/22/20; plan to continue TPN and reassess when albumin is at least 2.5. Per MD notes, albumin is now 2.5. MD plans to discuss plan moving forward further with GI and pt's brother.   Case discussed with pharmacist, who reports concern of continued overfeeding of pt; requests RD perform another calorie count to  determine if pt is consuming adequate PO's, as albumin level has risen to 2.5. Pharmacist also discussed that, per ASPEN guidelines, TPN use is not supported in this case.   Reviewed meal completion records over the past 48 hours. Pt has been consuming 1044 kcals and 16 grams protein, meeting approximately 60% of estimated kcal needs and 67% of estimated needs. Pt also very dedicated to drinking oral nutrition supplements of Prostat and Boost Breeze. Over the past 48 hours, pt has consumed on average 2094 kcals and 88 grams protein, meeting 100% of estimated kcal needs and 98% of estimated protein needs via meals and supplements alone.   Per pharmacy note, plan to continue cyclic TPN (945 ml over 12 hrs - 33 mL/hr x 1 hr, 65 mL/hr x 10 hr, then 33 mL/hr x 1 hr); regimen provides 859 kcals and 32 grams protein daily, meeting 49% of estimated kcal needs and 36% of estimated protein needs. Average nutritional intake via meals, supplements and TPN is 2953 kcals and 123 grams protein, meeting >100% of estimated nutritional needs.   Labs reviewed.   Diet Order:   Diet Order            DIET - DYS 1 Room service appropriate? Yes; Fluid consistency: Thin  Diet effective now              EDUCATION NEEDS:   Education needs have been addressed  Skin:  Skin Assessment: Skin Integrity Issues: Skin Integrity Issues:: Stage II Stage II: coccyx- epithelialized Other: MASD to groin, labia, buttocks, and sacrum  Last BM:  01/26/20  Height:  Ht Readings from Last 1 Encounters:  01/17/20 5' 4"  (1.626 m)    Weight:   Wt Readings from Last 1 Encounters:  01/25/20 42.7 kg    Ideal Body Weight:  54.5 kg  BMI:  Body mass index is 16.16 kg/m.  Estimated Nutritional Needs:   Kcal:  1674-2552  Protein:  90-105 grams  Fluid:  > 1.7 L    [name], RD, LDN, [place] Registered Dietitian II Certified Diabetes Care and Education Specialist Please refer to [facility] for RD and/or RD  on-call/weekend/after hours pager

## 2020-01-30 NOTE — Progress Notes (Signed)
Family Medicine Teaching Service Daily Progress Note Intern Pager: [phone]  Patient name: Deanna Cunningham Medical record number: [id number] Date of birth: Nov 04, 1945 Age: 74 y.o. Gender: female  Primary Care Provider: Kristie Cowman, MD Consultants: TOC,  Code Status: Full Code   Pt Overview and Major Events to Date:  Hospital Day: 50 12/12/2019: admitted for Weakness and Abnormal Lab 12/18/19 PICC line placed  4/19-4/23 macrobid for UTI  01/22/20 albumin 2.0  Assessment and Plan: Deanna Cunningham is a 74 y.o. female who presented w/ global weakness, weight loss, failure to thrive.  Past medical history significant for malnutrition, rheumatoid arthritis (nave to care), chronic anemia, GERD.   FTT Presbyesophagus, esophageal dysmotility GERD Most recent labs on 5/10: Albumin 2.5, phosp 4.4, Mg 1.9. % of meals eaten yesterday is not documented. Continues to drink Boost x 3 and Pro-stat 38ml TID.  After discussions of Albumin reaching 2.5 patient's brother prefers Deanna Cunningham not to have EGD until Albumin >3. See discussion with Deanna Cunningham below. -Continue TPN and lab monitoring per pharmacy. qMon/Thurs CBC,CMP Mg, Phos. qMon prealbumin, triglyc -Appreciate dietitian recs  -Daily weights -Continue Pepcid 20 mg twice, Protonix 40 mg, sucralfate with meals and at bedtime, thiamine 100 mg daily   -PRN: Compazine q6 n/v, GI cocktail  -Contact GI regarding EGD  Generalized Weakness  Likely deconditioned in setting of long hospital stay and poor nutritional status  -continue PT/OT -DME recs ordered  Bifascicular block Moderate MR/TR, stable Last EKG 12/27/2019, QTc 479 -Avoid QT prolonging medications  Anemia, stable Most recent Hb 8.5, stable  Required transfusions during this admission. -Continue CBC Mondays and Thursdays  Rheumatoid arthritis, chronic -Follows with rheumatology outpatient  FEN/GI:  . Fluids: None  . Electrolytes: Replete PRN  . Nutrition: Dysphagia 1,  TPN  Access: PICC day 37 (4/1/)  VTE prophylaxis: Lovenox 30 (CrCl <30)   Disposition: SNF, however, no SNFs offering bed 2/2 TPN status.   Subjective:  Patient would like to speak with Deanna Cunningham and Deanna Cunningham about the EGD today and also discuss with family and make her decision regarding EGD after that. She was concerned about developing an infection after the EGD. I reassured her that there is a low risk of this and that safety precautions are taken to minimize infection risk. I also explained that continued hospitalization for TPN is not indicated and that it may be better to go to a SNF which offers TPN services and monitor albumin levels as an outpatient and once they have improved she could have an outpatient EGD.  Objective: Temp:  [98 F (36.7 C)-98.5 F (36.9 C)] 98.1 F (36.7 C) (05/14 0714) Pulse Rate:  [71-96] 96 (05/14 0714) Resp:  [16-19] 18 (05/14 0714) BP: (101-110)/(53-62) 108/55 (05/14 0714) SpO2:  [98 %-99 %] 98 % (05/14 0714) Intake/Output      05/13 0701 - 05/14 0700 05/14 0701 - 05/15 0700   P.O. 720    I.V. (mL/kg) 429.6 (10.1)    Total Intake(mL/kg) 1149.6 (26.9)    Urine (mL/kg/hr) 4375 (4.3)    Total Output 4375    Net -3225.4             Physical Exam: General: Alert, frail, no acute distress Cardio: Normal S1 and S2, RRR Pulm: CTAB, normal WOB Abdomen: Bowel sounds normal. Abdomen soft and non-tender.  Extremities: No peripheral edema. Warm/ well perfused.  Strong radial pulse  Neuro: Cranial nerves grossly intact  CBC: Recent Labs  Lab 01/26/20 0357 01/29/20  0418  WBC 5.6 4.6  HGB 8.5* 8.3*  HCT 27.8* 26.8*  MCV 101.1* 98.5  PLT 240 222   CMP: Recent Labs  Lab 01/26/20 0358 01/29/20 0418  NA 136 136  K 4.0 4.3  CL 103 104  CO2 26 27  GLUCOSE 104* 102*  BUN 29* 30*  CREATININE 0.52 0.58  CALCIUM 8.7* 9.0  MG 1.8 1.9  PHOS 4.7* 4.4  ALBUMIN 2.2* 2.5*   CBG: No results for input(s): GLUCAP in the last 168 hours. Micro: Covid  Negative  No results found for this or any previous visit (from the past 240 hour(s)).   Imaging/Diagnostic Tests: No results found.  EKG Interpretation  Date/Time:  Friday December 12 2019 13:08:15 EDT Ventricular Rate:  99 PR Interval:  168 QRS Duration: 132 QT Interval:  402 QTC Calculation: 515 R Axis:   -66 Text Interpretation: Sinus rhythm with occasional Premature ventricular complexes and Premature atrial complexes Left axis deviation Right bundle branch block Anterior infarct , age undetermined Abnormal ECG Porlonged QTc, no STEMI , similar to prior ecg pattern Confirmed by Deanna Cunningham [phone]) on 12/12/2019 2:23:01 PM        Procedures:   Procedure Orders     Critical Care     ED EKG     EKG 12-Lead     EKG 12-Lead     EKG 12-Lead     EKG     EKG 12-Lead     EKG 12-Lead     EKG 12-Lead  Deanna Haw, MD 01/30/2020, 8:04 AM PGY-1, [place] Intern pager: [phone], text pages welcome

## 2020-01-30 NOTE — Progress Notes (Addendum)
PHARMACY - TOTAL PARENTERAL NUTRITION CONSULT NOTE   Indication: malnutrition  Patient Measurements: Height: 5\' 4"  (162.6 cm) Weight: 42.7 kg (94 lb 2.2 oz) IBW/kg (Calculated) : 54.7 TPN AdjBW (KG): 42.2 Body mass index is 16.16 kg/m.  Assessment: 74 yo female initially presented on 3/26 for weakness. Patient with PMH of RA, chronic anemia and GERD. She has had ongoing poor intake since Dec 2020 with LE swelling and unintentional weight loss. Pt previously on TPN from 4/5 to 4/10 which was stopped since pt had adequate oral intake. However, oral intake has been inconsistent, so TPN was restarted. Known esophageal dysmotility, presbyesophagus.  Discussed with patient/brother regarding risk/benefit/indication of TPN and relaying ASPEN guidelines indicate that TPN could be discontinued once patient is meeting > 60% of needs. Risk of overfeeding include increased respiratory workload, hepatic steatosis that could progress to fibrosis or cirrhosis. Brother acknowledges and wishes to continue TPN until albumin is at least 3.0  Glucose / Insulin: CBGs well controlled, SSI dc'd Electrolytes: all WNL Renal: Scr stable, BUN 30 LFTs / TGs: LFTs / Tbili / TG WNL Prealbumin / albumin: Prealbumin 9.2>>21.5, Albumin up to 2.5 Intake / Output; MIVF: UOP 2.7 ml/kg/hr, LBM 5/10 GI Imaging: 3/31 - barium swallow - unable to pass tablet - study shows esophageal dysmotility, dilation, and narrowing  4/1 CT abd pelvis - anasarca and ascites; likely underlying liver disease Surgeries / Procedures: see above GI recommends EGD with dilation but pt needs improved nutritional status   Central access: PICC 4/1 TPN start date: 12/22/2019>>4/10; 4/16>>  Nutritional Goals (per RD recommendation on 5/5): kCal: 1750-1950 per day, Protein: 90-105 g/day, Fluid: > 1.7 L  Current Nutrition:  TPN Dysphagia 1 diet - good appetite per patient, 100% meals charted yesterday Prostat TID - 100 kcal, 15 g protein (each) x 3  yesterday Boost Breeze TID - taking consistently, each supplement provides 250kcal and 9g protein x 3 yesterday  Plan:  - Continue cyclic TPN. Cycle 720 ml over 12 hrs - 33 mL/hr x 1 hr, 65 mL/hr x 10 hr, then 33 mL/hr x 1 hr - provides 32g protein and 859kcal per day. Note, TPN indication not supported by ASPEN guideline recommendations ($1600/d charge) - Oral nutritional supplements are providing 1050 kcal/day and 72g protein/day, meeting ~75% of protein needs - Oral supplements and TPN meet 100% of needs. Pt also taking in food as able, so currently meeting >100% of nutritional needs  - TPN Lytes: no changes - Continue daily PO multivitamin - Monitor TPN labs Mon/Thurs --consult RD for calorie count   Vallery Sa, PharmD, BCPS Please check AMION for all [facility] contact numbers Clinical Pharmacist 01/30/2020 8:11 AM

## 2020-01-30 NOTE — Plan of Care (Signed)
  Problem: Safety: Goal: Ability to remain free from injury will improve Outcome: Progressing   

## 2020-01-31 NOTE — Plan of Care (Signed)
  Problem: Skin Integrity: Goal: Risk for impaired skin integrity will decrease Outcome: Progressing   

## 2020-01-31 NOTE — Progress Notes (Signed)
Family Medicine Teaching Service Daily Progress Note Intern Pager: [phone]  Patient name: Deanna Cunningham Medical record number: [id number] Date of birth: September 28, 1945 Age: 74 y.o. Gender: female  Primary Care Provider: Kristie Cowman, MD Consultants: TOC,  Code Status: Full Code   Pt Overview and Major Events to Date:  Hospital Day: 51 12/12/2019: admitted for Weakness and Abnormal Lab 12/18/19 PICC line placed  4/19-4/23 macrobid for UTI  01/22/20 albumin 2.0  Assessment and Plan: Deanna Cunningham is a 74 y.o. female who presented w/ global weakness, weight loss, failure to thrive.  Past medical history significant for malnutrition, rheumatoid arthritis (nave to care), chronic anemia, GERD.   FTT Presbyesophagus, esophageal dysmotility GERD Most recent labs 5/13: alb 2.5, phos 4.4, mg 1.9. hgb 8.3. Continues to do well with liquid, states intolerance to puree and solids. Continues prostat tid and boost tid. Patient and her brother prefer to get EGD when albumin above 3. TPN stopped 5/14. - labs every Monday and Thursday - appreciate dietitian recs - daily weights - continue pepcid 20mg  bid, protonix 40mg , sucralfate with meals and at bedtime, thiamine 100mg  daily - compazine q 6hour n/v, gi cocktail - continue to discuss egd w/ patient and gi  Generalized Weakness  Likely deconditioned in setting of long hospital stay and poor nutritional status  -continue PT/OT -DME recs ordered  Bifascicular block Moderate MR/TR, stable Last EKG 12/27/2019, QTc 479 -Avoid QT prolonging medications  Anemia, stable Most recent Hb 8.3 on 5/13 Required transfusions during this admission. - Continue CBC Mondays and Thursdays  Rheumatoid arthritis, chronic -Follows with rheumatology outpatient  FEN/GI:  . Fluids: None  . Electrolytes: Replete PRN  . Nutrition: Dysphagia 1, TPN  Access: PICC, placed 12/19/2019 VTE prophylaxis: Lovenox 30  Disposition: SNF, however, no SNFs offering bed  2/2 TPN status.   Subjective: doing well this morning. No complaints, keeping fluids down.  Objective: Temp:  [98.1 F (36.7 C)-98.2 F (36.8 C)] 98.2 F (36.8 C) (05/15 0459) Pulse Rate:  [87-96] 87 (05/15 0459) Resp:  [14-18] 14 (05/14 1309) BP: (108-117)/(55-61) 117/61 (05/15 0459) SpO2:  [97 %-100 %] 100 % (05/15 0459) Intake/Output      05/14 0701 - 05/15 0700   P.O. 480   I.V. (mL/kg) 10 (0.2)   Total Intake(mL/kg) 490 (11.5)   Urine (mL/kg/hr) 3000 (2.9)   Stool 0   Total Output 3000   Net -2510       Stool Occurrence 1 x       Physical Exam: General: 74 year old AA female, no acute distress Cardio: rrr, no m/r/g Pulm: ctab, no accessory muscle use Abdomen: Bowel sounds normal. Abdomen soft and non-tender.  Extremities: No peripheral edema. Warm/ well perfused.  Strong radial pulse  Neuro: Cranial nerves grossly intact  CBC: Recent Labs  Lab 01/26/20 0357 01/29/20 0418  WBC 5.6 4.6  HGB 8.5* 8.3*  HCT 27.8* 26.8*  MCV 101.1* 98.5  PLT 240 222   CMP: Recent Labs  Lab 01/26/20 0358 01/29/20 0418  NA 136 136  K 4.0 4.3  CL 103 104  CO2 26 27  GLUCOSE 104* 102*  BUN 29* 30*  CREATININE 0.52 0.58  CALCIUM 8.7* 9.0  MG 1.8 1.9  PHOS 4.7* 4.4  ALBUMIN 2.2* 2.5*   CBG: No results for input(s): GLUCAP in the last 168 hours. Micro: Covid Negative  No results found for this or any previous visit (from the past 240 hour(s)).   Imaging/Diagnostic Tests: No results  found.  EKG Interpretation  Date/Time:  Friday December 12 2019 13:08:15 EDT Ventricular Rate:  99 PR Interval:  168 QRS Duration: 132 QT Interval:  402 QTC Calculation: 515 R Axis:   -66 Text Interpretation: Sinus rhythm with occasional Premature ventricular complexes and Premature atrial complexes Left axis deviation Right bundle branch block Anterior infarct , age undetermined Abnormal ECG Porlonged QTc, no STEMI , similar to prior ecg pattern Confirmed by Deanna Cunningham [id number]) on  12/12/2019 2:23:01 PM        Procedures:   Procedure Orders     Critical Care     ED EKG     EKG 12-Lead     EKG 12-Lead     EKG 12-Lead     EKG     EKG 12-Lead     EKG 12-Lead     EKG 12-Lead  Deanna Dawn, MD 01/31/2020, 7:00 AM PGY-3, [place] Intern pager: [phone], text pages welcome

## 2020-02-01 LAB — COMPREHENSIVE METABOLIC PANEL
ALT: 12 U/L (ref 0–44)
AST: 17 U/L (ref 15–41)
Albumin: 2.6 g/dL — ABNORMAL LOW (ref 3.5–5.0)
Alkaline Phosphatase: 62 U/L (ref 38–126)
Anion gap: 7 (ref 5–15)
BUN: 26 mg/dL — ABNORMAL HIGH (ref 8–23)
CO2: 25 mmol/L (ref 22–32)
Calcium: 9.4 mg/dL (ref 8.9–10.3)
Chloride: 103 mmol/L (ref 98–111)
Creatinine, Ser: 0.58 mg/dL (ref 0.44–1.00)
GFR calc Af Amer: >60 mL/min (ref >60)
GFR calc non Af Amer: >60 mL/min (ref >60)
Glucose, Bld: 109 mg/dL — ABNORMAL HIGH (ref 70–99)
Potassium: 3.6 mmol/L (ref 3.5–5.1)
Sodium: 135 mmol/L (ref 135–145)
Total Bilirubin: 0.2 mg/dL — ABNORMAL LOW (ref 0.3–1.2)
Total Protein: 7.1 g/dL (ref 6.5–8.1)

## 2020-02-01 NOTE — Progress Notes (Signed)
Family Medicine Teaching Service Daily Progress Note Intern Pager: [phone]  Patient name: Deanna Cunningham Medical record number: [id number] Date of birth: 1946-06-02 Age: 74 y.o. Gender: female  Primary Care Provider: Kristie Cowman, MD Consultants: TOC,  Code Status: Full Code   Pt Overview and Major Events to Date:  Hospital Day: 52 12/12/2019: admitted for Weakness and Abnormal Lab 12/18/19 PICC line placed  4/19-4/23 macrobid for UTI  5/6 albumin 2.0 5/13 albumin 2.5 5/14 TPN stopped  Assessment and Plan: Deanna Cunningham is a 74 y.o. female who presented w/ global weakness, weight loss, failure to thrive.  Past medical history significant for malnutrition, rheumatoid arthritis (nave to care), chronic anemia, GERD.   FTT Presbyesophagus, esophageal dysmotility GERD Slow improvement.  Most recent labs 5/16: alb 2.6, 5/13 phos 4.4, mg 1.9. hgb 8.3. Continues to do well with liquid diet.  Continues prostat tid and boost tid. Patient and her brother prefer to get EGD when albumin above 3. TPN stopped 5/14. - labs every Monday and Thursday - appreciate dietitian recs - daily weights - continue pepcid 20mg  bid, protonix 40mg , sucralfate with meals and at bedtime, thiamine 100mg  daily - compazine q 6hour n/v, GI cocktail - continue to discuss egd w/ patient and GI - follow up discharge plan per CSW  Generalized Weakness  Likely deconditioned in setting of long hospital stay and poor nutritional status  -continue PT/OT -DME recs ordered  Bifascicular block Moderate MR/TR, stable Last EKG 12/27/2019, QTc 479 -Avoid QT prolonging medications  Anemia, stable Most recent Hb 8.3 on 5/13 Required transfusions during this admission. - Continue CBC Mondays and Thursdays  Rheumatoid arthritis, chronic -Follows with rheumatology outpatient  FEN/GI:  . Fluids: None  . Electrolytes: Replete PRN  . Nutrition: Dysphagia 1, TPN  Access: PICC, placed 12/19/2019 VTE prophylaxis:  Lovenox 30  Disposition: SNF, however, no SNFs offering bed 2/2 TPN status.   Subjective: Tolerating liquid diet, no complaints today.  Objective: Temp:  [98 F (36.7 C)-98.7 F (37.1 C)] 98.7 F (37.1 C) (05/15 2006) Pulse Rate:  [74-90] 74 (05/15 2006) Resp:  [17-18] 17 (05/15 2006) BP: (103-117)/(58-68) 108/68 (05/15 2006) SpO2:  [92 %-100 %] 100 % (05/15 2006) Intake/Output      05/15 0701 - 05/16 0700   P.O. 1440   Total Intake(mL/kg) 1440 (33.7)   Urine (mL/kg/hr) 2725 (2.7)   Total Output 2725   Net -1285           Physical Exam: General: thin, frail appearing female lying comfortably in bed Cardio: RRR, no MRG Pulm: CTAB, normal WOB on room air Abdomen: Bowel sounds normal. Abdomen soft and non-tender.  Extremities: No peripheral edema. Warm/ well perfused.  CBC: Recent Labs  Lab 01/26/20 0357 01/29/20 0418  WBC 5.6 4.6  HGB 8.5* 8.3*  HCT 27.8* 26.8*  MCV 101.1* 98.5  PLT 240 222   CMP: Recent Labs  Lab 01/26/20 0358 01/29/20 0418  NA 136 136  K 4.0 4.3  CL 103 104  CO2 26 27  GLUCOSE 104* 102*  BUN 29* 30*  CREATININE 0.52 0.58  CALCIUM 8.7* 9.0  MG 1.8 1.9  PHOS 4.7* 4.4  ALBUMIN 2.2* 2.5*   CBG: No results for input(s): GLUCAP in the last 168 hours. Micro: Covid Negative  No results found for this or any previous visit (from the past 240 hour(s)).   Imaging/Diagnostic Tests: No results found.  EKG Interpretation  Date/Time:  Friday December 12 2019 13:08:15 EDT Ventricular Rate:  99 PR Interval:  168 QRS Duration: 132 QT Interval:  402 QTC Calculation: 515 R Axis:   -66 Text Interpretation: Sinus rhythm with occasional Premature ventricular complexes and Premature atrial complexes Left axis deviation Right bundle branch block Anterior infarct , age undetermined Abnormal ECG Porlonged QTc, no STEMI , similar to prior ecg pattern Confirmed by Octaviano Glow [phone]) on 12/12/2019 2:23:01 PM        Procedures:   Procedure  Orders     Critical Care     ED EKG     EKG 12-Lead     EKG 12-Lead     EKG 12-Lead     EKG     EKG 12-Lead     EKG 12-Lead     EKG 12-Lead  Kathrene Alu, MD 02/01/2020, 12:33 AM PGY-3, [place] Intern pager: [phone], text pages welcome

## 2020-02-01 NOTE — Plan of Care (Signed)
  Problem: Education: Goal: Knowledge of General Education information will improve Description: Including pain rating scale, medication(s)/side effects and non-pharmacologic comfort measures Outcome: Progressing   Problem: Health Behavior/Discharge Planning: Goal: Ability to manage health-related needs will improve Outcome: Progressing   Problem: Activity: Goal: Risk for activity intolerance will decrease Outcome: Progressing   Problem: Elimination: Goal: Will not experience complications related to bowel motility Outcome: Progressing Goal: Will not experience complications related to urinary retention Outcome: Progressing   Problem: Pain Managment: Goal: General experience of comfort will improve Outcome: Progressing   Problem: Safety: Goal: Ability to remain free from injury will improve Outcome: Progressing   Problem: Skin Integrity: Goal: Risk for impaired skin integrity will decrease Outcome: Progressing

## 2020-02-02 LAB — CBC WITH DIFFERENTIAL/PLATELET
Abs Immature Granulocytes: 0.03 10*3/uL (ref 0.00–0.07)
Basophils Absolute: 0 10*3/uL (ref 0.0–0.1)
Basophils Relative: 0 %
Eosinophils Absolute: 0.2 10*3/uL (ref 0.0–0.5)
Eosinophils Relative: 4 %
HCT: 28 % — ABNORMAL LOW (ref 36.0–46.0)
Hemoglobin: 8.6 g/dL — ABNORMAL LOW (ref 12.0–15.0)
Immature Granulocytes: 1 %
Lymphocytes Relative: 24 %
Lymphs Abs: 1.1 10*3/uL (ref 0.7–4.0)
MCH: 30.8 pg (ref 26.0–34.0)
MCHC: 30.7 g/dL (ref 30.0–36.0)
MCV: 100.4 fL — ABNORMAL HIGH (ref 80.0–100.0)
Monocytes Absolute: 0.6 10*3/uL (ref 0.1–1.0)
Monocytes Relative: 13 %
Neutro Abs: 2.6 10*3/uL (ref 1.7–7.7)
Neutrophils Relative %: 58 %
Platelets: 227 10*3/uL (ref 150–400)
RBC: 2.79 MIL/uL — ABNORMAL LOW (ref 3.87–5.11)
RDW: 14.2 % (ref 11.5–15.5)
WBC: 4.6 10*3/uL (ref 4.0–10.5)
nRBC: 0 % (ref 0.0–0.2)

## 2020-02-02 LAB — COMPREHENSIVE METABOLIC PANEL
ALT: 12 U/L (ref 0–44)
AST: 15 U/L (ref 15–41)
Albumin: 2.5 g/dL — ABNORMAL LOW (ref 3.5–5.0)
Alkaline Phosphatase: 62 U/L (ref 38–126)
Anion gap: 5 (ref 5–15)
BUN: 26 mg/dL — ABNORMAL HIGH (ref 8–23)
CO2: 25 mmol/L (ref 22–32)
Calcium: 9.3 mg/dL (ref 8.9–10.3)
Chloride: 106 mmol/L (ref 98–111)
Creatinine, Ser: 0.54 mg/dL (ref 0.44–1.00)
GFR calc Af Amer: >60 mL/min (ref >60)
GFR calc non Af Amer: >60 mL/min (ref >60)
Glucose, Bld: 90 mg/dL (ref 70–99)
Potassium: 3.7 mmol/L (ref 3.5–5.1)
Sodium: 136 mmol/L (ref 135–145)
Total Bilirubin: 0.4 mg/dL (ref 0.3–1.2)
Total Protein: 6.8 g/dL (ref 6.5–8.1)

## 2020-02-02 LAB — PREALBUMIN: Prealbumin: 22.9 mg/dL (ref 18–38)

## 2020-02-02 LAB — MAGNESIUM: Magnesium: 1.5 mg/dL — ABNORMAL LOW (ref 1.7–2.4)

## 2020-02-02 LAB — PHOSPHORUS: Phosphorus: 4.5 mg/dL (ref 2.5–4.6)

## 2020-02-02 LAB — TRIGLYCERIDES: Triglycerides: 106 mg/dL (ref <150)

## 2020-02-02 MED ORDER — TRAVASOL 10 % IV SOLN
INTRAVENOUS | Status: AC
  Filled 2020-02-02: qty 747.24

## 2020-02-02 MED ORDER — MAGNESIUM SULFATE IN D5W 1-5 GM/100ML-% IV SOLN
1.0000 g | Freq: Once | INTRAVENOUS | Status: AC
  Administered 2020-02-02: 1 g via INTRAVENOUS
  Filled 2020-02-02: qty 100

## 2020-02-02 MED ORDER — SODIUM CHLORIDE 0.9 % IV SOLN: INTRAVENOUS | Status: DC

## 2020-02-02 NOTE — Progress Notes (Signed)
Nutrition Follow-up  RD working remotely.  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  Ongoing  GOAL:   Patient will meet greater than or equal to 90% of their needs  Progressing   MONITOR:   PO intake, Supplement acceptance, Diet advancement, Labs, Weight trends, Skin, I & O's  REASON FOR ASSESSMENT:   Consult Assessment of nutrition requirement/status  ASSESSMENT:   Deanna Cunningham is a 74 y.o. female presenting with weakness. PMH is significant for Rheumatoid Arthritis (no tx, no rheumatologist), chronic anemia and GERD.  4/2- PICC placed 4/5- TPN initiated 4/10- TPN d/c 4/17- TPN restarted due to nausea, vomiting, poor oral intake 4/20- transitioned to cyclic TPN [id number]- TPN d/c  Reviewed I/O's: -1.5 L x 24 hours and -25.9 L since 01/19/20  UOP: 2.7 L x 24 hours  Pt awaiting GI consult to determine if able to proceed with EGD. Pt albumin has been 2.5 or greater over the weekend.   TPN d/c due to concern of overfeeding.   Pt remains in dysphagia 1 diet, however, consuming mostly liquids of minimal nutritional density due to severe stricture (meal trays consists of cream soups, broths, juices, and applesauce). Reviewed meal completion records over the past 72 hours; pt is consuming approximately 851 kcals and 10 grams protein daily off meal trays, meeting 49% of estimated kcal needs and 11% of protein needs. Pt  continues to consume Boost Breeze and Prostat supplements. Average daily PO intake over the past 72 hours (meals and supplements) is 1901 kcals and 82 grams protein, meeting 100% of estimated kcal needs and 91% of estimated protein needs.   Labs reviewed: Mg: 1.5. K and Mg WDL.   Diet Order:   Diet Order            DIET - DYS 1 Room service appropriate? Yes; Fluid consistency: Thin  Diet effective now              EDUCATION NEEDS:   Education needs have been addressed  Skin:  Skin Assessment: Skin Integrity Issues: Skin Integrity Issues:: Stage II Stage II: coccyx- epithelialized Other: MASD to groin, labia, buttocks, and sacrum  Last BM:  02/01/20  Height:   Ht Readings from Last 1 Encounters:  01/17/20 5\' 4"  (1.626 m)    Weight:   Wt Readings from Last 1 Encounters:  02/02/20 41.6 kg    Ideal Body Weight:  54.5 kg  BMI:  Body mass index is 15.74 kg/m.  Estimated Nutritional Needs:   Kcal:  [id number]  Protein:  90-105 grams  Fluid:  > 1.7 L    [name], RD, LDN, [place] Registered Dietitian II Certified Diabetes Care and Education Specialist Please refer to [facility] for RD and/or RD on-call/weekend/after hours pager

## 2020-02-02 NOTE — Plan of Care (Signed)

## 2020-02-02 NOTE — Progress Notes (Signed)
PHARMACY - TOTAL PARENTERAL NUTRITION CONSULT NOTE  Indication: malnutrition  Patient Measurements: Height: 5\' 4"  (162.6 cm) Weight: 41.6 kg (91 lb 11.4 oz) IBW/kg (Calculated) : 54.7 TPN AdjBW (KG): 42.2 Body mass index is 15.74 kg/m.  Assessment:  46 YOF initially presented on 3/26 for weakness. Patient with PMH of RA, chronic anemia and GERD. She has had ongoing poor intake since Dec 2020 with LE swelling and unintentional weight loss.  Known esophageal dysmotility, presbyesophagus.  Pharmacy consulted to restart TPN  Discussed with patient/brother regarding risk/benefit/indication of TPN and relaying ASPEN guidelines indicate that TPN could be discontinued once patient is meeting > 60% of needs. Risk of overfeeding include increased respiratory workload, hepatic steatosis that could progress to fibrosis or cirrhosis. Brother acknowledges and wishes to continue TPN until albumin is at least 3.0  Glucose / Insulin: CBGs well controlled, SSI dc'd Electrolytes: all WNL except Mag 1.5 (CoCa high normal at 10.5) Renal: SCr stable, BUN 26 LFTs / TGs: LFTs / tbili / TG WNL Prealbumin / albumin: prealbumin increased to WNL at 22.9, albumin up to 2.5 Intake / Output; MIVF: UOP 2.7 ml/kg/hr, LBM 5/16 GI Imaging:  3/31 MBS - unable to pass tablet, esophageal dysmotility, dilation and narrowing  4/1 CT abd pelvis - anasarca and ascites; likely underlying liver disease Surgeries / Procedures:  GI recommends EGD with dilation but patient/family postponed   Central access: PICC 12/18/19 TPN start date: 12/22/2019>>4/10; 4/16>>5/14; 5/17 >>  Nutritional Goals (per RD rec on 5/17): kCal: 1750-1950 per day, Protein: 90-105 g/day, Fluid: > 1.7 L  Each TPN bag to provide 500kCal and 75gm AA per discussion with MD on 5/17 (weekly avg of 32g AA per day).   Current Nutrition:  Dysphagia 1 diet - good appetite per patient, 100% meals charted yesterday Prostat TID - 100 kcal, 15 g protein (each) x 3  yesterday Boost Breeze TID - taking consistently, each supplement provides 250kcal and 9g protein x 3 yesterday  Plan:  Resume cyclic TPN on MWF only per MD Cycle 975ml over 12 hrs: 44 mL/hr x 1 hr, 87 mL/hr x 10 hr, then 44 mL/hr x 1 hr  Each TPN bag will provide 75g AA and 512 kCal Oral supplements and dysphagia diet meet 100% of kCal and 91% of protein need as of 5/17 TPN and oral intake is meeting > 100% of patient needs Lytes in TPN: increased Na/Mag and reduced K/Phos from standard, Cl:Ac 1:1 Continue daily PO multivitamin (no multivitamin and trace elements in TPN) Mag sulfate 1gm I x 1 Monitor weight, albumin and re-assess TPN provision as needed F/U AM labs  Deanna Cunningham, PharmD, BCPS, [place] 02/02/2020, 11:54 AM

## 2020-02-02 NOTE — Progress Notes (Signed)
Family Medicine Teaching Service Daily Progress Note Intern Pager: [phone]  Patient name: Deanna Cunningham Medical record number: [id number] Date of birth: 07-16-1946 Age: 74 y.o. Gender: female  Primary Care Provider: Kristie Cowman, MD Consultants: TOC,  Code Status: Full Code   Pt Overview and Major Events to Date:  Hospital Day: 53 12/12/2019: admitted for Weakness and Abnormal Lab 12/18/19 PICC line placed  4/19-4/23 macrobid for UTI  5/6 albumin 2.0 5/13 albumin 2.5 5/14 TPN stopped  Assessment and Plan: Deanna Cunningham is a 74 y.o. female who presented w/ global weakness, weight loss, failure to thrive.  Past medical history significant for malnutrition, rheumatoid arthritis (nave to care), chronic anemia, GERD.   FTT Presbyesophagus, esophageal dysmotility GERD Most recent labs today: alb 2.5, 2.6 on 5/16, Na 136, K 3.7, gluc 90, Cr 0.54, phosp 4.5, Mg 1.5, Alb LFTs wnl Patient is continuing to do well on liquid diet, ate 50% of meals yesterday. Continues prostat tid and boost tid. Patient and her brother prefer to get EGD when albumin above 3. TPN stopped 5/14 and will restart today for MWF scheduled. Hoping to discharge to SNF which provides 3 times weekly TPN in order to increase Albumin>3 and have OP EGD. Since patient is already meeting nutritional goal of 2000 calories/day and last week received above this (3000 calories including TPN calories) will need to adjust TPN dosing. -Labs every Monday and Thursday -Appreciate dietitian recs -Daily weights -Continue pepcid 20mg  bid, protonix 40mg , sucralfate with meals and at bedtime, thiamine 100mg  daily -Compazine q 6hour n/v, GI cocktail -Continue to discuss egd w/ patient and GI -Follow up discharge plan per CSW  Generalized Weakness  Likely deconditioned in setting of long hospital stay and poor nutritional status  -continue PT/OT -DME recs ordered  Bifascicular block Moderate MR/TR, stable Last EKG 12/27/2019, QTc  479 -Avoid QT prolonging medications  Anemia, stable Hb 8.6 stable  Required transfusions during this admission. - Continue CBC Mondays and Thursdays  Rheumatoid arthritis, chronic -Follows with rheumatology outpatient  FEN/GI:  . Fluids: None  . Electrolytes: Replete PRN  . Nutrition: Dysphagia 1, TPN  Access: PICC, placed 12/19/2019 VTE prophylaxis: Lovenox 30  Disposition: SNF which offers TPN   Subjective:  No acute events overnight. Is waiting to speak to GI regarding EGD.   Objective: Temp:  [98 F (36.7 C)-98.8 F (37.1 C)] 98 F (36.7 C) (05/17 0749) Pulse Rate:  [87-104] 89 (05/17 0749) Resp:  [15-16] 16 (05/17 0749) BP: (106-117)/(59-73) 116/59 (05/17 0749) SpO2:  [86 %-100 %] 100 % (05/17 0749) Weight:  [41.6 kg] 41.6 kg (05/17 0359) Intake/Output      05/16 0701 - 05/17 0700 05/17 0701 - 05/18 0700   P.O. 1200    I.V. (mL/kg) 10 (0.2)    Total Intake(mL/kg) 1210 (29.1)    Urine (mL/kg/hr) 2685 (2.7)    Stool 0    Total Output 2685    Net -1475         Urine Occurrence 2 x    Stool Occurrence 1 x        Physical Exam: General: Alert, frail, no acute distress, sitting up in bed  Cardio: Normal S1 and S2, RRR Pulm: CTAB, normal WOB Abdomen: Bowel sounds normal. Abdomen soft and non-tender.  Extremities: No peripheral edema. Warm/ well perfused.  Neuro: Cranial nerves grossly intact  CBC: Recent Labs  Lab 01/29/20 0418 02/02/20 0442  WBC 4.6 4.6  NEUTROABS  --  2.6  HGB 8.3*  8.6*  HCT 26.8* 28.0*  MCV 98.5 100.4*  PLT 222 227   CMP: Recent Labs  Lab 01/29/20 0418 02/01/20 0445 02/02/20 0442  NA 136 135 136  K 4.3 3.6 3.7  CL 104 103 106  CO2 27 25 25   GLUCOSE 102* 109* 90  BUN 30* 26* 26*  CREATININE 0.58 0.58 0.54  CALCIUM 9.0 9.4 9.3  MG 1.9  --  1.5*  PHOS 4.4  --  4.5  ALBUMIN 2.5* 2.6* 2.5*   CBG: No results for input(s): GLUCAP in the last 168 hours. Micro: Covid Negative  No results found for this or any previous  visit (from the past 240 hour(s)).   Imaging/Diagnostic Tests: No results found.  EKG Interpretation  Date/Time:  Friday December 12 2019 13:08:15 EDT Ventricular Rate:  99 PR Interval:  168 QRS Duration: 132 QT Interval:  402 QTC Calculation: 515 R Axis:   -66 Text Interpretation: Sinus rhythm with occasional Premature ventricular complexes and Premature atrial complexes Left axis deviation Right bundle branch block Anterior infarct , age undetermined Abnormal ECG Porlonged QTc, no STEMI , similar to prior ecg pattern Confirmed by Octaviano Glow [phone]) on 12/12/2019 2:23:01 PM        Procedures:   Procedure Orders     Critical Care     ED EKG     EKG 12-Lead     EKG 12-Lead     EKG 12-Lead     EKG     EKG 12-Lead     EKG 12-Lead     EKG 12-Lead  [name], MD 02/02/2020, 8:00 AM PGY-3, [place] Intern pager: [phone], text pages welcome

## 2020-02-02 NOTE — Progress Notes (Signed)
Subjective: Since I last evaluated the patient, she seems to be doing fairly well.  Her albumin has increased to 2.6 g/dL and she is tolerating the TPN well.  After extensive discussion with her brother over the phone today, the patient seems to be be willing to proceed with an EGD tomorrow.  She is tolerating liquids well and also is eating some applesauce but is unable to eat solids.  Objective: Vital signs in last 24 hours: Temp:  [98 F (36.7 C)-98.8 F (37.1 C)] 98.3 F (36.8 C) (05/17 1544) Pulse Rate:  [65-104] 65 (05/17 1544) Resp:  [15-16] 16 (05/17 1544) BP: (106-117)/(59-73) 111/64 (05/17 1544) SpO2:  [100 %] 100 % (05/17 1544) Weight:  [41.6 kg] 41.6 kg (05/17 0359) Last BM Date: 02/01/20  Intake/Output from previous day: 05/16 0701 - 05/17 0700 In: 1210 [P.O.:1200; I.V.:10] Out: 2685 [Urine:2685] Intake/Output this shift: No intake/output data recorded.  General appearance: alert, cooperative, appears stated age, cachectic and no distress Resp: clear to auscultation bilaterally Cardio: regular rate and rhythm, S1, S2 normal, no murmur, click, rub or gallop GI: soft, non-tender; bowel sounds normal; no masses,  no organomegaly  Lab Results: Recent Labs    02/02/20 0442  WBC 4.6  HGB 8.6*  HCT 28.0*  PLT 227   BMET Recent Labs    02/01/20 0445 02/02/20 0442  NA 135 136  K 3.6 3.7  CL 103 106  CO2 25 25  GLUCOSE 109* 90  BUN 26* 26*  CREATININE 0.58 0.54  CALCIUM 9.4 9.3   LFT Recent Labs    02/02/20 0442  PROT 6.8  ALBUMIN 2.5*  AST 15  ALT 12  ALKPHOS 62  BILITOT 0.4  : No results found. Medications: I have reviewed the patient's current medications.  Assessment/Plan: Solid food dysphagia/abnormal barium swallow with significant esophageal dysmotility ? distal esophageal stricture/protein calorie malnutrition-we will proceed with a gentle EGD tomorrow and possible balloon dilation if needed.  The risks and benefits of the procedure been  discussed with the patient in great detail.  Her brother is aware of the fact that if she does not have the EGD she will be sent to skilled nursing facility to receive TPN there and that we cannot keep her in the hospital for unlimited period of time to wait for albumin to recheck 3.5 level which her family expects should be the cutoff point before an EGD is done.  Therefore the plans are to proceed with an EGD tomorrow.  Patient does understand the risks and benefits of the procedure.  Further recommendation made after Dr. Benson Norway has done the EGD.    LOS: 52 days   Juanita Craver 02/02/2020, 4:08 PM

## 2020-02-03 LAB — PHOSPHORUS: Phosphorus: 3.9 mg/dL (ref 2.5–4.6)

## 2020-02-03 LAB — BASIC METABOLIC PANEL
Anion gap: 7 (ref 5–15)
BUN: 40 mg/dL — ABNORMAL HIGH (ref 8–23)
CO2: 23 mmol/L (ref 22–32)
Calcium: 9 mg/dL (ref 8.9–10.3)
Chloride: 107 mmol/L (ref 98–111)
Creatinine, Ser: 0.52 mg/dL (ref 0.44–1.00)
GFR calc Af Amer: >60 mL/min (ref >60)
GFR calc non Af Amer: >60 mL/min (ref >60)
Glucose, Bld: 103 mg/dL — ABNORMAL HIGH (ref 70–99)
Potassium: 3.7 mmol/L (ref 3.5–5.1)
Sodium: 137 mmol/L (ref 135–145)

## 2020-02-03 LAB — MAGNESIUM: Magnesium: 1.8 mg/dL (ref 1.7–2.4)

## 2020-02-03 LAB — SARS CORONAVIRUS 2 (TAT 6-24 HRS): SARS Coronavirus 2: NEGATIVE

## 2020-02-03 NOTE — Progress Notes (Signed)
Occupational Therapy Treatment Patient Details Name: Deanna Cunningham MRN: [id number] DOB: [birth year] Today's Date: 02/03/2020    History of present illness Pt is a 74 y.o. female admitted 12/12/19 with progressive weakness, poor PO intake and BLE swelling; worked up for malnutrition (concern for malignancy). Admitted with severe protein calorie malnutrition In the setting of known esophageal dysmotility. Pt also with anemia, electrolyte imbalance, stage 1 sacral wound. Pt with recurrent asymptomatic SVT; may occur in the settings of severe malnutrition and electrolyte imbalance. PMH includes dysphagia, CKD III, anemia, RA.   OT comments  Pt demonstrated great improvement toward prior level of functioning this session. She required minA without AD for functional mobility. Pt completed grooming task while standing at sink level without UE support and minguard-minA for stability. She tolerated standing for 52min. Following task pt stated "that's the first time I've done that since I've been here", referring to standing for entire grooming task. Pt will continue to benefit from skilled OT services to maximize safety and independence with ADL/IADL and functional mobility. Will continue to follow acutely and progress as tolerated.    Follow Up Recommendations  Home health OT;Supervision/Assistance - 24 hour    Equipment Recommendations  3 in 1 bedside commode;Tub/shower bench    Recommendations for Other Services      Precautions / Restrictions Precautions Precautions: Fall;Other (comment) Precaution Comments: Bilateral hand/finger RA, sacral wound, rectal prolapse (occurred 4/16) Restrictions Weight Bearing Restrictions: No       Mobility Bed Mobility Overal bed mobility: Modified Independent                Transfers Overall transfer level: Needs assistance Equipment used: None Transfers: Sit to/from Stand Sit to Stand: Min assist         General transfer comment: minA for  stability in standing, no AD used this session    Balance Overall balance assessment: Needs assistance Sitting-balance support: Feet supported;No upper extremity supported Sitting balance-Leahy Scale: Good Sitting balance - Comments: Can reach bilateral feet to fix socks    Standing balance support: No upper extremity supported Standing balance-Leahy Scale: Fair Standing balance comment: minA for stability while completing bimanual grooming task while standing at sink                            ADL either performed or assessed with clinical judgement   ADL Overall ADL's : Needs assistance/impaired     Grooming: Minimal assistance;Standing Grooming Details (indicate cue type and reason): at sink level unsupported during bimanual task for 37minutes             Lower Body Dressing: Minimal assistance;Sit to/from stand Lower Body Dressing Details (indicate cue type and reason): minA for 1 hand support for stability in standing Toilet Transfer: Minimal assistance;Ambulation Toilet Transfer Details (indicate cue type and reason): 1 person hand held support  Toileting- Clothing Manipulation and Hygiene: Minimal assistance;Sit to/from stand       Functional mobility during ADLs: Minimal assistance(1 person hand held support, pt pushing IV pole at times) General ADL Comments: pt stood at sink level to complete grooming for 42minutes with minA for stability     Vision       Perception     Praxis      Cognition Arousal/Alertness: Awake/alert Behavior During Therapy: WFL for tasks assessed/performed Overall Cognitive Status: Within Functional Limits for tasks assessed  Exercises     Shoulder Instructions       General Comments vss;pt's daughter present during session    Pertinent Vitals/ Pain       Pain Assessment: No/denies pain Pain Intervention(s): Monitored during session  Home Living                                           Prior Functioning/Environment              Frequency  Min 2X/week        Progress Toward Goals  OT Goals(current goals can now be found in the care plan section)  Progress towards OT goals: Progressing toward goals  Acute Rehab OT Goals Patient Stated Goal: pt wanting to go home now OT Goal Formulation: With patient Time For Goal Achievement: 02/11/20 Potential to Achieve Goals: Good ADL Goals Pt Will Perform Grooming: with modified independence;standing Pt Will Perform Upper Body Bathing: with modified independence;standing;sitting Pt Will Perform Lower Body Bathing: with modified independence;sit to/from stand Pt Will Perform Upper Body Dressing: with modified independence;sitting Pt Will Perform Lower Body Dressing: with modified independence;sit to/from stand Pt Will Transfer to Toilet: with modified independence;ambulating;regular height toilet Pt Will Perform Toileting - Clothing Manipulation and hygiene: with modified independence;sit to/from stand Pt/caregiver will Perform Home Exercise Program: Increased strength;Right Upper extremity;Left upper extremity;With Supervision;With written HEP provided  Plan Discharge plan remains appropriate    Co-evaluation                 AM-PAC OT "6 Clicks" Daily Activity     Outcome Measure   Help from another person eating meals?: A Little Help from another person taking care of personal grooming?: A Little Help from another person toileting, which includes using toliet, bedpan, or urinal?: A Little Help from another person bathing (including washing, rinsing, drying)?: A Little Help from another person to put on and taking off regular upper body clothing?: A Little Help from another person to put on and taking off regular lower body clothing?: A Little 6 Click Score: 18    End of Session Equipment Utilized During Treatment: Gait belt  OT Visit Diagnosis:  Unsteadiness on feet (R26.81);Repeated falls (R29.6);Muscle weakness (generalized) (M62.81)   Activity Tolerance Patient tolerated treatment well   Patient Left with call bell/phone within reach;in chair;with chair alarm set;with family/visitor present   Nurse Communication Mobility status        Time: [id number] OT Time Calculation (min): 23 min  Charges: OT General Charges $OT Visit: 1 Visit OT Treatments $Self Care/Home Management : 23-37 mins  Helene Kelp OTR/L [place] Office: [place] 02/03/2020, 4:22 PM

## 2020-02-03 NOTE — Progress Notes (Signed)
PHARMACY - TOTAL PARENTERAL NUTRITION CONSULT NOTE  Indication: malnutrition  Patient Measurements: Height: 5\' 4"  (162.6 cm) Weight: 41.6 kg (91 lb 11.4 oz) IBW/kg (Calculated) : 54.7 TPN AdjBW (KG): 42.2 Body mass index is 15.74 kg/m.  Assessment:  65 YOF initially presented on 3/26 for weakness. Patient with PMH of RA, chronic anemia and GERD. She has had ongoing poor intake since Dec 2020 with LE swelling and unintentional weight loss.  Known esophageal dysmotility, presbyesophagus.  Pharmacy consulted to restart TPN  Discussed with patient/brother regarding risk/benefit/indication of TPN and relaying ASPEN guidelines indicate that TPN could be discontinued once patient is meeting > 60% of needs. Risk of overfeeding include increased respiratory workload, hepatic steatosis that could progress to fibrosis or cirrhosis. Brother acknowledges and wishes to continue TPN until albumin is at least 3.0  Glucose / Insulin: CBGs well controlled, SSI dc'd Electrolytes: all WNL  (CoCa high normal at 10.5) Renal: SCr stable, BUN up to 40 LFTs / TGs: LFTs / tbili / TG WNL Prealbumin / albumin: prealbumin increased to WNL at 22.9, albumin up to 2.5 Intake / Output; MIVF: UOP 2.2 ml/kg/hr, LBM 5/16 GI Imaging:  3/31 MBS - unable to pass tablet, esophageal dysmotility, dilation and narrowing  4/1 CT abd pelvis - anasarca and ascites; likely underlying liver disease Surgeries / Procedures:  GI recommends EGD with dilation but patient/family postponed 5/18 Plan for EGD with possible balloon dilation today   Central access: PICC 12/18/19 TPN start date: 12/22/2019>>4/10; 4/16>>5/14; 5/17 >>  Nutritional Goals (per RD rec on 5/17): kCal: 1750-1950 per day, Protein: 90-105 g/day, Fluid: > 1.7 L  Each TPN bag to provide 500kCal and 75gm AA per discussion with MD on 5/17 (weekly avg of 32g AA per day).   Current Nutrition:  Dysphagia 1 diet - good appetite per patient Prostat TID - 100 kcal, 15 g  protein (each) x 3 yesterday Boost Breeze TID - taking consistently, each supplement provides 250kcal and 9g protein x 3 yesterday  Plan:  NO TPN today, providing cyclic TPN on MWF only per MD Cycle 951ml over 12 hrs: 44 mL/hr x 1 hr, 87 mL/hr x 10 hr, then 44 mL/hr x 1 hr  Each TPN bag will provide 75g AA and 512 kCal Oral supplements and dysphagia diet meet 100% of kCal and 91% of protein need as of 5/17 TPN and oral intake is meeting > 100% of patient needs Lytes in TPN: increased Na/Mag and reduced K/Phos from standard, Cl:Ac 1:1 Continue daily PO multivitamin (no multivitamin and trace elements in TPN) Monitor weight, albumin and re-assess TPN provision as needed  Alanda Slim, PharmD, [facility] Clinical Pharmacist Please see AMION for all Pharmacists' Contact Phone Numbers 02/03/2020, 7:27 AM

## 2020-02-03 NOTE — Progress Notes (Signed)
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  Ongoing  GOAL:   Patient will meet greater than or equal to 90% of their needs  Progressing   MONITOR:   PO intake, Supplement acceptance, Diet advancement, Labs, Weight trends, Skin, I & O's  REASON FOR ASSESSMENT:   Consult Assessment of nutrition requirement/status  ASSESSMENT:   Deanna Cunningham is a 74 y.o. female presenting with weakness. PMH is significant for Rheumatoid Arthritis (no tx, no rheumatologist), chronic anemia and GERD.  4/2- PICC placed 4/5- TPN initiated 4/10- TPN d/c 4/17- TPN restarted due to nausea, vomiting, poor oral intake 4/20- transitioned to cyclic TPN [id number]- TPN d/c  Per GI notes, pt NPO for EGD today.   TPN re-started on 01/31/20. Plan to provide cyclic TPN on Monday, Wednesday, and Friday only; Cycle 955ml over 12 hrs: 44 mL/hr x 1 hr, 87 mL/hr x 10 hr, then 44 mL/hr x 1 hr. Regimen to provide 512 kcals and 75 grams protein, meeting 29% of estimated kcal needs and 83% of estimated protein needs.   Labs reviewed.   Diet Order:   Diet Order            Diet NPO time specified  Diet effective midnight              EDUCATION NEEDS:   Education needs have been addressed  Skin:  Skin Assessment: Skin Integrity Issues: Skin Integrity Issues:: Stage II Stage II: coccyx-  epithelialized Other: MASD to groin, labia, buttocks, and sacrum  Last BM:  02/01/20  Height:   Ht Readings from Last 1 Encounters:  01/17/20 5\' 4"  (1.626 m)    Weight:   Wt Readings from Last 1 Encounters:  02/02/20 41.6 kg    Ideal Body Weight:  54.5 kg  BMI:  Body mass index is 15.74 kg/m.  Estimated Nutritional Needs:   Kcal:  [id number]  Protein:  90-105 grams  Fluid:  > 1.7 L    Deanna Cunningham, RD, LDN, Deanna Cunningham Registered Dietitian II Certified Diabetes Care and Education Specialist Please refer to [facility] [place] for RD and/or RD on-call/weekend/after hours pager

## 2020-02-03 NOTE — Progress Notes (Signed)
Physical Therapy Treatment Patient Details Name: Deanna Cunningham MRN: [id number] DOB: [birth year] Today's Date: 02/03/2020    History of Present Illness Pt is a 74 y.o. female admitted 12/12/19 with progressive weakness, poor PO intake and BLE swelling; worked up for malnutrition (concern for malignancy). Admitted with severe protein calorie malnutrition In the setting of known esophageal dysmotility. Pt also with anemia, electrolyte imbalance, stage 1 sacral wound. Pt with recurrent asymptomatic SVT; may occur in the settings of severe malnutrition and electrolyte imbalance. PMH includes dysphagia, CKD III, anemia, RA.    PT Comments    Pt seated in recliner.  Pt continues to require min to moderate assistance. To ambulate without device.  Pt able to increase gt distance this session.  Continue to recommend SNF placement at this time to continue to improve strength, coordination and balance before returning home.      Follow Up Recommendations  SNF;Supervision/Assistance - 24 hour     Equipment Recommendations  3in1 (PT)    Recommendations for Other Services       Precautions / Restrictions Precautions Precautions: Fall;Other (comment) Precaution Comments: Bilateral hand/finger RA, sacral wound, rectal prolapse (occurred 4/16) Restrictions Weight Bearing Restrictions: No    Mobility  Bed Mobility Overal bed mobility: Modified Independent             General bed mobility comments: Pt seated in recliner on arrival.  Transfers Overall transfer level: Needs assistance Equipment used: None Transfers: Sit to/from Stand Sit to Stand: Min assist         General transfer comment: Cues for hand placement with min assistance to maintain balance once in standing.  Ambulation/Gait Ambulation/Gait assistance: Min assist;Mod assist Gait Distance (Feet): 150 Feet Assistive device: None Gait Pattern/deviations: Step-through pattern;Decreased stride length;Narrow base of  support;Decreased dorsiflexion - left;Staggering right;Staggering left;Decreased dorsiflexion - right Gait velocity: decreased   General Gait Details: Continued cues for posture and min to mod assistance to right balance.  Pt with improved dorsiflexion and less scissoring noted.  Pt continues to become more confident without device but LOB continues.   Stairs             Wheelchair Mobility    Modified Rankin (Stroke Patients Only)       Balance Overall balance assessment: Needs assistance Sitting-balance support: Feet supported;No upper extremity supported Sitting balance-Leahy Scale: Good Sitting balance - Comments: Can reach bilateral feet to fix socks    Standing balance support: No upper extremity supported Standing balance-Leahy Scale: Fair Standing balance comment: minA for stability while completing bimanual grooming task while standing at sink                             Cognition Arousal/Alertness: Awake/alert Behavior During Therapy: WFL for tasks assessed/performed Overall Cognitive Status: Within Functional Limits for tasks assessed                                 General Comments: educated pt on importance of self advocation and importance of engaging in ADL with increased independence;educated pt on benefits of frequently getting out of bed, encouraged pt to get out of bed at least 3x/day      Exercises General Exercises - Lower Extremity Long Arc Quad: AROM;Both;10 reps;Seated Hip Flexion/Marching: AROM;Both;10 reps;Seated Mini-Sqauts: AROM;Both;10 reps;Standing(via chair push-ups.)    General Comments General comments (skin integrity, edema, etc.): vss;pt's daughter present during  session      Pertinent Vitals/Pain Pain Assessment: Faces Faces Pain Scale: Hurts a little bit Pain Location: L Foot at pressure sore Pain Descriptors / Indicators: Guarding Pain Intervention(s): Monitored during session;Repositioned     Home Living                      Prior Function            PT Goals (current goals can now be found in the care plan section) Acute Rehab PT Goals Patient Stated Goal: pt wanting to go home now Potential to Achieve Goals: Fair Progress towards PT goals: Progressing toward goals    Frequency    Min 2X/week      PT Plan Current plan remains appropriate    Co-evaluation              AM-PAC PT "6 Clicks" Mobility   Outcome Measure  Help needed turning from your back to your side while in a flat bed without using bedrails?: None Help needed moving from lying on your back to sitting on the side of a flat bed without using bedrails?: None Help needed moving to and from a bed to a chair (including a wheelchair)?: A Little Help needed standing up from a chair using your arms (e.g., wheelchair or bedside chair)?: A Little Help needed to walk in hospital room?: A Little Help needed climbing 3-5 steps with a railing? : A Little 6 Click Score: 20    End of Session Equipment Utilized During Treatment: Gait belt Activity Tolerance: Patient tolerated treatment well;Patient limited by fatigue Patient left: with call bell/phone within reach;in chair;with chair alarm set Nurse Communication: Mobility status PT Visit Diagnosis: Other abnormalities of gait and mobility (R26.89);Muscle weakness (generalized) (M62.81);Difficulty in walking, not elsewhere classified (R26.2) Pain - Right/Left: (bilateral) Pain - part of body: Leg;Ankle and joints of foot     Time: [id number] PT Time Calculation (min) (ACUTE ONLY): 24 min  Charges:  $Gait Training: 8-22 mins $Therapeutic Exercise: 8-22 mins                     Erasmo Leventhal , PTA Acute Rehabilitation Services Pager [phone] Office [place] 02/03/2020, 5:19 PM

## 2020-02-03 NOTE — Progress Notes (Signed)
Family Medicine Teaching Service Daily Progress Note Intern Pager: [phone]  Patient name: Deanna Cunningham Medical record number: [id number] Date of birth: 1946/03/18 Age: 74 y.o. Gender: female  Primary Care Provider: Kristie Cowman, MD Consultants: TOC,  Code Status: Full Code   Pt Overview and Major Events to Date:  Hospital Day: 54 12/12/2019: admitted for Weakness and Abnormal Lab 12/18/19 PICC line placed  4/19-4/23 macrobid for UTI  5/6 albumin 2.0 5/13 albumin 2.5 5/14 TPN stopped  Assessment and Plan: Deanna Cunningham is a 74 y.o. female who presented w/ global weakness, weight loss, failure to thrive.  Past medical history significant for malnutrition, rheumatoid arthritis (nave to care), chronic anemia, GERD.   FTT Presbyesophagus, esophageal dysmotility GERD Most recent labs on 5/17: alb 2.5, 2.6 on 5/16, Na 136, K 3.7, gluc 90, Cr 0.54, phosp 4.5, Mg 1.5, Alb LFTs wnl Patient is continuing to do well on liquid diet, % of meals eaten yesterday not documented. Continues prostat tid and boost tid. Restarted TPN on 5/17 MWF 500 calories. Seen by GI on 5/17, will proceed with EGD today  -Labs every Monday and Thursday -Appreciate dietitian recs -Daily weights -Continue pepcid 20mg  bid, protonix 40mg , sucralfate with meals and at bedtime, thiamine 100mg  daily -Compazine q 6hour n/v, GI cocktail -Continue to discuss egd w/ patient and GI -Follow up discharge plan per CSW  Generalized Weakness  Likely deconditioned in setting of long hospital stay and poor nutritional status  -continue PT/OT -DME recs ordered  Bifascicular block Moderate MR/TR, stable Last EKG 12/27/2019, QTc 479 -Avoid QT prolonging medications  Anemia, stable Hb 8.6 on 5/17. stable  Required transfusions during this admission. - Continue CBC Mondays and Thursdays  Rheumatoid arthritis, chronic -Follows with rheumatology outpatient  FEN/GI:  . Fluids: N.S 8ml/hour  . Electrolytes: Replete  PRN  . Nutrition: Dysphagia 1, TPN  Access: PICC, placed 12/19/2019 VTE prophylaxis: Lovenox 30  Disposition: SNF which offers TPN   Subjective:  No acute events overnight. Waiting for egd today and its results to decide disposition.  Objective: Temp:  [98 F (36.7 C)-98.4 F (36.9 C)] 98.4 F (36.9 C) (05/18 0748) Pulse Rate:  [65-97] 88 (05/18 0748) Resp:  [16-17] 17 (05/18 0748) BP: (101-121)/(55-71) 101/55 (05/18 0748) SpO2:  [97 %-100 %] 100 % (05/18 0748) Intake/Output      05/17 0701 - 05/18 0700 05/18 0701 - 05/19 0700   P.O.     I.V. (mL/kg) 489 (11.8)    Total Intake(mL/kg) 489 (11.8)    Urine (mL/kg/hr) 2175 (2.2)    Stool     Total Output 2175    Net -1686             Physical Exam: General: Alert, no acute distress, frail  Cardio: Normal S1 and S2, RRR Pulm: CTAB, RRR Abdomen: Bowel sounds normal. Abdomen soft and non-tender.  Extremities: No peripheral edema. Warm/ well perfused. Neuro: Cranial nerves grossly intact  CBC: Recent Labs  Lab 01/29/20 0418 02/02/20 0442  WBC 4.6 4.6  NEUTROABS  --  2.6  HGB 8.3* 8.6*  HCT 26.8* 28.0*  MCV 98.5 100.4*  PLT 222 227   CMP: Recent Labs  Lab 01/29/20 0418 01/29/20 0418 02/01/20 0445 02/02/20 0442 02/03/20 0432  NA 136   < > 135 136 137  K 4.3   < > 3.6 3.7 3.7  CL 104   < > 103 106 107  CO2 27   < > 25 25 23   GLUCOSE  102*   < > 109* 90 103*  BUN 30*   < > 26* 26* 40*  CREATININE 0.58   < > 0.58 0.54 0.52  CALCIUM 9.0   < > 9.4 9.3 9.0  MG 1.9  --   --  1.5* 1.8  PHOS 4.4  --   --  4.5 3.9  ALBUMIN 2.5*  --  2.6* 2.5*  --    < > = values in this interval not displayed.   CBG: No results for input(s): GLUCAP in the last 168 hours. Micro: Covid Negative  Recent Results (from the past 240 hour(s))  SARS CORONAVIRUS 2 (TAT 6-24 HRS) Nasopharyngeal Nasopharyngeal Swab     Status: None   Collection Time: 02/02/20  9:32 PM   Specimen: Nasopharyngeal Swab  Result Value Ref Range Status    SARS Coronavirus 2 NEGATIVE NEGATIVE Final    Comment: (NOTE) SARS-CoV-2 target nucleic acids are NOT DETECTED. The SARS-CoV-2 RNA is generally detectable in upper and lower respiratory specimens during the acute phase of infection. Negative results do not preclude SARS-CoV-2 infection, do not rule out co-infections with other pathogens, and should not be used as the sole basis for treatment or other patient management decisions. Negative results must be combined with clinical observations, patient history, and epidemiological information. The expected result is Negative. Fact Sheet for Patients: SugarRoll.be Fact Sheet for Healthcare Providers: https://www.woods-mathews.com/ This test is not yet approved or cleared by the [place] FDA and  has been authorized for detection and/or diagnosis of SARS-CoV-2 by FDA under an Emergency Use Authorization (EUA). This EUA will remain  in effect (meaning this test can be used) for the duration of the COVID-19 declaration under Section 56 4(b)(1) of the Act, 21 U.S.C. section 360bbb-3(b)(1), unless the authorization is terminated or revoked sooner. Performed at [facility], [place] [address], [place], [place] [address]      Imaging/Diagnostic Tests: No results found.  EKG Interpretation  Date/Time:  Friday December 12 2019 13:08:15 EDT Ventricular Rate:  99 PR Interval:  168 QRS Duration: 132 QT Interval:  402 QTC Calculation: 515 R Axis:   -66 Text Interpretation: Sinus rhythm with occasional Premature ventricular complexes and Premature atrial complexes Left axis deviation Right bundle branch block Anterior infarct , age undetermined Abnormal ECG Porlonged QTc, no STEMI , similar to prior ecg pattern Confirmed by Octaviano Glow [phone]) on 12/12/2019 2:23:01 PM        Procedures:   Procedure Orders     Critical Care     Procedural/ Surgical Case Request: ESOPHAGOGASTRODUODENOSCOPY  (EGD) WITH PROPOFOL     ED EKG     EKG 12-Lead     EKG 12-Lead     EKG 12-Lead     EKG     EKG 12-Lead     EKG 12-Lead     EKG 12-Lead  [name], MD 02/03/2020, 7:52 AM PGY-3, [place] Intern pager: [phone], text pages welcome

## 2020-02-03 NOTE — Plan of Care (Signed)

## 2020-02-03 NOTE — H&P (View-Only) (Signed)
Subjective: No complaints feeling well.  Objective: Vital signs in last 24 hours: Temp:  [98 F (36.7 C)-98.5 F (36.9 C)] 98.5 F (36.9 C) (05/18 1420) Pulse Rate:  [83-97] 86 (05/18 1420) Resp:  [16-17] 17 (05/18 0748) BP: (101-121)/(55-71) 105/60 (05/18 1420) SpO2:  [97 %-100 %] 100 % (05/18 1420) Last BM Date: 01/30/20  Intake/Output from previous day: 05/17 0701 - 05/18 0700 In: 489 [I.V.:489] Out: 2175 [Urine:2175] Intake/Output this shift: Total I/O In: 133.7 [I.V.:133.7] Out: 1775 [Urine:1775]  General appearance: alert and no distress GI: soft, non-tender; bowel sounds normal; no masses,  no organomegaly  Lab Results: Recent Labs    02/02/20 0442  WBC 4.6  HGB 8.6*  HCT 28.0*  PLT 227   BMET Recent Labs    02/01/20 0445 02/02/20 0442 02/03/20 0432  NA 135 136 137  K 3.6 3.7 3.7  CL 103 106 107  CO2 25 25 23   GLUCOSE 109* 90 103*  BUN 26* 26* 40*  CREATININE 0.58 0.54 0.52  CALCIUM 9.4 9.3 9.0   LFT Recent Labs    02/02/20 0442  PROT 6.8  ALBUMIN 2.5*  AST 15  ALT 12  ALKPHOS 62  BILITOT 0.4   PT/INR No results for input(s): LABPROT, INR in the last 72 hours. Hepatitis Panel No results for input(s): HEPBSAG, HCVAB, HEPAIGM, HEPBIGM in the last 72 hours. C-Diff No results for input(s): CDIFFTOX in the last 72 hours. Fecal Lactopherrin No results for input(s): FECLLACTOFRN in the last 72 hours.  Studies/Results: No results found.  Medications:  Scheduled: . Chlorhexidine Gluconate Cloth  6 each Topical Daily  . enoxaparin (LOVENOX) injection  30 mg Subcutaneous Q24H  . famotidine  20 mg Oral BID  . feeding supplement  1 Container Oral TID BM  . feeding supplement (PRO-STAT SUGAR FREE 64)  30 mL Oral TID  . mouth rinse  15 mL Mouth Rinse BID  . multivitamin with minerals  1 tablet Oral Daily  . pantoprazole  40 mg Oral Daily  . sodium chloride flush  10-40 mL Intracatheter Q12H  . sucralfate  1 g Oral TID WC & HS  . thiamine   100 mg Oral Daily   Continuous: . sodium chloride 20 mL/hr at 02/03/20 1500    Assessment/Plan: 1) Dysphagia. 2) Severe malnutrition.   I spoke to the patient, her daughter, and her brother about tomorrow's procedure.  She will undergo an EGD with dilation.  The mode of dilation and size of dilation will be determined at the time of the procedure.  All parties are in agreeance of the plan.  Plan: 1) EGD with dilation tomorrow at 1:15 PM. 2) Hold tomorrow's Lovenox.  LOS: 53 days   Khylin Gutridge D 02/03/2020, 4:29 PM

## 2020-02-03 NOTE — Plan of Care (Signed)
  Problem: Nutrition: Goal: Adequate nutrition will be maintained Outcome: Progressing   Problem: Coping: Goal: Level of anxiety will decrease Outcome: Progressing   Problem: Elimination: Goal: Will not experience complications related to bowel motility Outcome: Progressing Goal: Will not experience complications related to urinary retention Outcome: Completed/Met   Problem: Safety: Goal: Ability to remain free from injury will improve Outcome: Progressing   Problem: Skin Integrity: Goal: Risk for impaired skin integrity will decrease Outcome: Progressing

## 2020-02-03 NOTE — Progress Notes (Signed)
Subjective: No complaints feeling well.  Objective: Vital signs in last 24 hours: Temp:  [98 F (36.7 C)-98.5 F (36.9 C)] 98.5 F (36.9 C) (05/18 1420) Pulse Rate:  [83-97] 86 (05/18 1420) Resp:  [16-17] 17 (05/18 0748) BP: (101-121)/(55-71) 105/60 (05/18 1420) SpO2:  [97 %-100 %] 100 % (05/18 1420) Last BM Date: 01/30/20  Intake/Output from previous day: 05/17 0701 - 05/18 0700 In: 489 [I.V.:489] Out: 2175 [Urine:2175] Intake/Output this shift: Total I/O In: 133.7 [I.V.:133.7] Out: 1775 [Urine:1775]  General appearance: alert and no distress GI: soft, non-tender; bowel sounds normal; no masses,  no organomegaly  Lab Results: Recent Labs    02/02/20 0442  WBC 4.6  HGB 8.6*  HCT 28.0*  PLT 227   BMET Recent Labs    02/01/20 0445 02/02/20 0442 02/03/20 0432  NA 135 136 137  K 3.6 3.7 3.7  CL 103 106 107  CO2 25 25 23   GLUCOSE 109* 90 103*  BUN 26* 26* 40*  CREATININE 0.58 0.54 0.52  CALCIUM 9.4 9.3 9.0   LFT Recent Labs    02/02/20 0442  PROT 6.8  ALBUMIN 2.5*  AST 15  ALT 12  ALKPHOS 62  BILITOT 0.4   PT/INR No results for input(s): LABPROT, INR in the last 72 hours. Hepatitis Panel No results for input(s): HEPBSAG, HCVAB, HEPAIGM, HEPBIGM in the last 72 hours. C-Diff No results for input(s): CDIFFTOX in the last 72 hours. Fecal Lactopherrin No results for input(s): FECLLACTOFRN in the last 72 hours.  Studies/Results: No results found.  Medications:  Scheduled: . Chlorhexidine Gluconate Cloth  6 each Topical Daily  . enoxaparin (LOVENOX) injection  30 mg Subcutaneous Q24H  . famotidine  20 mg Oral BID  . feeding supplement  1 Container Oral TID BM  . feeding supplement (PRO-STAT SUGAR FREE 64)  30 mL Oral TID  . mouth rinse  15 mL Mouth Rinse BID  . multivitamin with minerals  1 tablet Oral Daily  . pantoprazole  40 mg Oral Daily  . sodium chloride flush  10-40 mL Intracatheter Q12H  . sucralfate  1 g Oral TID WC & HS  . thiamine   100 mg Oral Daily   Continuous: . sodium chloride 20 mL/hr at 02/03/20 1500    Assessment/Plan: 1) Dysphagia. 2) Severe malnutrition.   I spoke to the patient, her daughter, and her brother about tomorrow's procedure.  She will undergo an EGD with dilation.  The mode of dilation and size of dilation will be determined at the time of the procedure.  All parties are in agreeance of the plan.  Plan: 1) EGD with dilation tomorrow at 1:15 PM. 2) Hold tomorrow's Lovenox.  LOS: 53 days   Cejay Cambre D 02/03/2020, 4:29 PM

## 2020-02-04 ENCOUNTER — Encounter (HOSPITAL_COMMUNITY)
Admission: EM | Disposition: A | Discharge status: Skilled Nursing Facility | Payer: Self-pay | Source: Home / Self Care | Attending: Family Medicine

## 2020-02-04 ENCOUNTER — Inpatient Hospital Stay (HOSPITAL_COMMUNITY): Payer: BC Managed Care – PPO | Admitting: Certified Registered Nurse Anesthetist

## 2020-02-04 ENCOUNTER — Encounter (HOSPITAL_COMMUNITY): Payer: Self-pay | Admitting: Family Medicine

## 2020-02-04 HISTORY — PX: ESOPHAGOGASTRODUODENOSCOPY (EGD) WITH PROPOFOL: SHX5813

## 2020-02-04 HISTORY — PX: SAVORY DILATION: SHX5439

## 2020-02-04 SURGERY — ESOPHAGOGASTRODUODENOSCOPY (EGD) WITH PROPOFOL
Anesthesia: Monitor Anesthesia Care

## 2020-02-04 MED ORDER — TRAVASOL 10 % IV SOLN
INTRAVENOUS | Status: AC
  Filled 2020-02-04: qty 747.24

## 2020-02-04 MED ORDER — PROPOFOL 500 MG/50ML IV EMUL
INTRAVENOUS | Status: DC | PRN
  Administered 2020-02-04: 100 ug/kg/min via INTRAVENOUS

## 2020-02-04 MED ORDER — LIDOCAINE 2% (20 MG/ML) 5 ML SYRINGE
INTRAMUSCULAR | Status: DC | PRN
  Administered 2020-02-04: 40 mg via INTRAVENOUS

## 2020-02-04 MED ORDER — LACTATED RINGERS IV SOLN: INTRAVENOUS | Status: DC

## 2020-02-04 MED ORDER — FLUCONAZOLE 100MG IVPB
100.0000 mg | INTRAVENOUS | Status: DC
  Administered 2020-02-04 – 2020-02-05 (×2): 100 mg via INTRAVENOUS
  Filled 2020-02-04 (×3): qty 50

## 2020-02-04 SURGICAL SUPPLY — 15 items

## 2020-02-04 NOTE — Progress Notes (Signed)
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  Ongoing  GOAL:   Patient will meet greater than or equal to 90% of their needs  Progressing   MONITOR:   PO intake, Supplement acceptance, Diet advancement, Labs, Weight trends, Skin, I & O's  REASON FOR ASSESSMENT:   Consult Assessment of nutrition requirement/status  ASSESSMENT:   Deanna Cunningham is a 74 y.o. female presenting with weakness. PMH is significant for Rheumatoid Arthritis (no tx, no rheumatologist), chronic anemia and GERD.  4/2- PICC placed 4/5- TPN initiated 4/10- TPN d/c 4/17- TPN restarted due to nausea, vomiting, poor oral intake 4/20- transitioned to cyclic TPN [id number]- TPN d/c  Reviewed I/O's: -1.6 L x 24 hours and -27.1 L since 01/21/20  UOP: 2.1 L x 24 hours  Per GI notes, pt now agreeable to EGD; plan to occur today at 1315. Pt currently NPO for procedure.   TPN re-started on 01/31/20. Plan to provide cyclic TPN on Monday, Wednesday, and Friday only; Cycle 927ml over 12 hrs: 44 mL/hr x 1 hr, 87 mL/hr x 10 hr, then 44 mL/hr x 1 hr. Regimen to provide 512 kcals and 75 grams protein, meeting 29% of estimated kcal needs and 83% of estimated protein needs. Per pharmacy note, pt will receive TPN today.   Labs reviewed.   Diet Order:   Diet Order            Diet NPO time specified  Diet effective midnight               EDUCATION NEEDS:   Education needs have been addressed  Skin:  Skin Assessment: Skin Integrity Issues: Skin Integrity Issues:: Stage II Stage II: coccyx- epithelialized Other: MASD to groin, labia, buttocks, and sacrum  Last BM:  02/03/20  Height:   Ht Readings from Last 1 Encounters:  01/17/20 5\' 4"  (1.626 m)    Weight:   Wt Readings from Last 1 Encounters:  02/02/20 41.6 kg    Ideal Body Weight:  54.5 kg  BMI:  Body mass index is 15.74 kg/m.  Estimated Nutritional Needs:   Kcal:  [id number]  Protein:  90-105 grams  Fluid:  > 1.7 L    [name], RD, LDN, [place] Registered Dietitian II Certified Diabetes Care and Education Specialist Please refer to [facility] for RD and/or RD on-call/weekend/after hours pager

## 2020-02-04 NOTE — Progress Notes (Signed)
Pt arrived back to the unit. Pt not in distress and tolerated well. Pt can now have clear liquids post procedure.

## 2020-02-04 NOTE — Interval H&P Note (Signed)
History and Physical Interval Note:  02/04/2020 12:37 PM  Deanna Cunningham  has presented today for surgery, with the diagnosis of Severe dysphagia with protein calorie malnutrition and an abnormal barium swallow.  The various methods of treatment have been discussed with the patient and family. After consideration of risks, benefits and other options for treatment, the patient has consented to  Procedure(s): ESOPHAGOGASTRODUODENOSCOPY (EGD) WITH PROPOFOL (Left) as a surgical intervention.  The patient's history has been reviewed, patient examined, no change in status, stable for surgery.  I have reviewed the patient's chart and labs.  Questions were answered to the patient's satisfaction.     Jamare Vanatta D

## 2020-02-04 NOTE — Op Note (Addendum)
[facility] Patient Name: Deanna Cunningham Procedure Date : 02/04/2020 MRN: [id number] Attending MD: Carol Ada , MD Date of Birth: 1946/06/28 CSN: [id number] Age: 74 Admit Type: Inpatient Procedure:                Upper GI endoscopy Indications:              Dysphagia Providers:                Carol Ada, MD, Carlyn Reichert, RN, Theodora Blow,                            Technician, Elspeth Cho Tech., Technician Referring MD:              Medicines:                Propofol per Anesthesia Complications:            No immediate complications. Estimated Blood Loss:     Estimated blood loss: none. Procedure:                Pre-Anesthesia Assessment:                           - Prior to the procedure, a History and Physical                            was performed, and patient medications and                            allergies were reviewed. The patient's tolerance of                            previous anesthesia was also reviewed. The risks                            and benefits of the procedure and the sedation                            options and risks were discussed with the patient.                            All questions were answered, and informed consent                            was obtained. Prior Anticoagulants: The patient has                            taken no previous anticoagulant or antiplatelet                            agents. ASA Grade Assessment: III - A patient with                            severe systemic disease. After reviewing the risks  and benefits, the patient was deemed in                            satisfactory condition to undergo the procedure.                           - Sedation was administered by an anesthesia                            professional. Deep sedation was attained.                           After obtaining informed consent, the endoscope was                            passed under direct  vision. Throughout the                            procedure, the patient's blood pressure, pulse, and                            oxygen saturations were monitored continuously. The                            [id number] Olympus gastroscope was                            introduced through the mouth, and advanced to the                            second part of duodenum. The upper GI endoscopy was                            accomplished without difficulty. The patient                            tolerated the procedure well. Scope In: Scope Out: Findings:      One benign-appearing, intrinsic moderate stenosis was found at the       cricopharyngeus. This stenosis measured 1 cm (inner diameter) x less       than one cm (in length). The stenosis was traversed. A guidewire was       placed and the scope was withdrawn. Dilation was performed with a Savary       dilator with no resistance at 12.8 mm. The dilation site was examined       following endoscope reinsertion and showed moderate improvement in       luminal narrowing. Estimated blood loss: none.      Diffuse, white plaques were found in the entire esophagus.      A 5 cm hiatal hernia was present.      The stomach was normal.      The examined duodenum was normal.      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 It was initially       difficult to pass through the area, but with tip defelction of the scope  the area was traversed. There was a heavy Candidal esophagitis       infection. In the distal esophagus, a widely patent Schatzki's ring was       found. In the distal esophagus there was stasis of fluid and the       esohpagus was noted to be markedly dilated. A 5 cm hiatal hernia was       also noted. Savary dilation over a guidewire was performed with the 11       mm, 12 mm, and 12.8 mm dilator. Some resistance was encoutered with the       12 mm dilator, but there was no resistance with the 12.8 mm dilator.        Minimal heme was noted on the dilators. Impression:               - Benign-appearing esophageal stenosis. Dilated.                           - Esophageal plaques were found, consistent with                            candidiasis.                           - 5 cm hiatal hernia.                           - Normal stomach.                           - Normal examined duodenum.                           - No specimens collected. Recommendation:           - Return patient to hospital ward for ongoing care.                           - Clear liquid diet.                           - Continue present medications.                           - Diflucan (fluconazole) 100 mg PO daily for 10                            days.                           - Repeat dilation in 2 weeks. Procedure Code(s):        --- Professional ---                           [phone], Esophagogastroduodenoscopy, flexible,                            transoral; with insertion of guide wire followed by  passage of dilator(s) through esophagus over guide                            wire Diagnosis Code(s):        --- Professional ---                           K22.2, Esophageal obstruction                           K22.9, Disease of esophagus, unspecified                           K44.9, Diaphragmatic hernia without obstruction or                            gangrene                           R13.10, Dysphagia, unspecified CPT copyright 2019 American Medical Association. All rights reserved. The codes documented in this report are preliminary and upon coder review may  be revised to meet current compliance requirements. Carol Ada, MD Carol Ada, MD 02/04/2020 1:43:54 PM This report has been signed electronically. Number of Addenda: 0

## 2020-02-04 NOTE — Plan of Care (Signed)
  Problem: Nutrition: Goal: Adequate nutrition will be maintained Outcome: Progressing   

## 2020-02-04 NOTE — Anesthesia Preprocedure Evaluation (Signed)
Anesthesia Evaluation  Patient identified by MRN, date of birth, ID band Patient awake    Reviewed: Allergy & Precautions, H&P , NPO status , Patient's Chart, lab work & pertinent test results  History of Anesthesia Complications (+) AWARENESS UNDER ANESTHESIA  Airway Mallampati: II  TM Distance: >3 FB Neck ROM: Full    Dental no notable dental hx. (+) Edentulous Upper, Edentulous Lower, Dental Advisory Given   Pulmonary neg pulmonary ROS,    Pulmonary exam normal breath sounds clear to auscultation       Cardiovascular negative cardio ROS   Rhythm:Regular Rate:Normal     Neuro/Psych negative neurological ROS  negative psych ROS   GI/Hepatic negative GI ROS, Neg liver ROS,   Endo/Other  negative endocrine ROS  Renal/GU negative Renal ROS  negative genitourinary   Musculoskeletal  (+) Arthritis ,   Abdominal   Peds  Hematology  (+) Blood dyscrasia, anemia ,   Anesthesia Other Findings   Reproductive/Obstetrics negative OB ROS                             Anesthesia Physical Anesthesia Plan  ASA: II  Anesthesia Plan: MAC   Post-op Pain Management:    Induction: Intravenous  PONV Risk Score and Plan: 2 and Propofol infusion and Treatment may vary due to age or medical condition  Airway Management Planned: Nasal Cannula  Additional Equipment:   Intra-op Plan:   Post-operative Plan:   Informed Consent: I have reviewed the patients History and Physical, chart, labs and discussed the procedure including the risks, benefits and alternatives for the proposed anesthesia with the patient or authorized representative who has indicated his/her understanding and acceptance.     Dental advisory given  Plan Discussed with: CRNA  Anesthesia Plan Comments:         Anesthesia Quick Evaluation

## 2020-02-04 NOTE — Anesthesia Procedure Notes (Signed)
Procedure Name: MAC Date/Time: 02/04/2020 12:59 PM Performed by: Alain Marion, CRNA Pre-anesthesia Checklist: Patient identified, Emergency Drugs available, Suction available and Patient being monitored Oxygen Delivery Method: Nasal cannula Placement Confirmation: positive ETCO2

## 2020-02-04 NOTE — Anesthesia Postprocedure Evaluation (Signed)
Anesthesia Post Note  Patient: Deanna Cunningham  Procedure(s) Performed: ESOPHAGOGASTRODUODENOSCOPY (EGD) WITH PROPOFOL (Left ) SAVORY DILATION (N/A )     Patient location during evaluation: Endoscopy Anesthesia Type: MAC Level of consciousness: awake and alert Pain management: pain level controlled Vital Signs Assessment: post-procedure vital signs reviewed and stable Respiratory status: spontaneous breathing, nonlabored ventilation and respiratory function stable Cardiovascular status: blood pressure returned to baseline and stable Postop Assessment: no apparent nausea or vomiting Anesthetic complications: no    Last Vitals:  Vitals:   02/04/20 1355 02/04/20 1435  BP: 129/65 116/73  Pulse: 90 93  Resp: 17 16  Temp:  36.8 C  SpO2: 100% 96%    Last Pain:  Vitals:   02/04/20 1435  TempSrc: Oral  PainSc:                  Lynda Rainwater

## 2020-02-04 NOTE — Progress Notes (Signed)
PHARMACY - TOTAL PARENTERAL NUTRITION CONSULT NOTE  Indication: malnutrition  Patient Measurements: Height: 5\' 4"  (162.6 cm) Weight: 41.6 kg (91 lb 11.4 oz) IBW/kg (Calculated) : 54.7 TPN AdjBW (KG): 42.2 Body mass index is 15.74 kg/m.  Assessment:  23 YOF initially presented on 3/26 for weakness. Patient with PMH of RA, chronic anemia and GERD. She has had ongoing poor intake since Dec 2020 with LE swelling and unintentional weight loss.  Known esophageal dysmotility, presbyesophagus.  Pharmacy consulted to restart TPN  Discussed with patient/brother regarding risk/benefit/indication of TPN and relaying ASPEN guidelines indicate that TPN could be discontinued once patient is meeting > 60% of needs. Risk of overfeeding include increased respiratory workload, hepatic steatosis that could progress to fibrosis or cirrhosis. Brother acknowledges and wishes to continue TPN until albumin is at least 3.0  Glucose / Insulin: CBGs well controlled, SSI dc'd Electrolytes: no new labs this am, all WNL  (CoCa high normal at 10.5) Renal: SCr stable, BUN up to 40 LFTs / TGs: LFTs / tbili / TG WNL Prealbumin / albumin: prealbumin increased to WNL at 22.9, albumin up to 2.5 Intake / Output; MIVF: UOP 2.1 ml/kg/hr, LBM 5/16 GI Imaging:  3/31 MBS - unable to pass tablet, esophageal dysmotility, dilation and narrowing  4/1 CT abd pelvis - anasarca and ascites; likely underlying liver disease Surgeries / Procedures:  GI recommends EGD with dilation but patient/family postponed 5/18 Plan for EGD with possible balloon dilation 5/19   Central access: PICC 12/18/19 TPN start date: 12/22/2019>>4/10; 4/16>>5/14; 5/17 >>  Nutritional Goals (per RD rec on 5/17): kCal: 1750-1950 per day, Protein: 90-105 g/day, Fluid: > 1.7 L  Each TPN bag to provide 500kCal and 75gm AA per discussion with MD on 5/17 (weekly avg of 32g AA per day).   Current Nutrition:  Dysphagia 1 diet - good appetite per patient Prostat TID -  100 kcal, 15 g protein (each) x 3 yesterday Boost Breeze TID - taking consistently, each supplement provides 250kcal and 9g protein x 3 yesterday  Plan:  Cyclic TPN on MWF only per MD Cycle 942ml over 12 hrs: 44 mL/hr x 1 hr, 87 mL/hr x 10 hr, then 44 mL/hr x 1 hr  Each TPN bag will provide 75g AA and 512 kCal Oral supplements and dysphagia diet meet 100% of kCal and 91% of protein need as of 5/17 TPN and oral intake is meeting > 100% of patient needs Lytes in TPN: increased Na/Mag and reduced K/Phos from standard, Cl:Ac 1:1 Continue daily PO multivitamin (no multivitamin and trace elements in TPN) Monitor weight, albumin and re-assess TPN provision as needed  Alanda Slim, PharmD, [facility] Clinical Pharmacist Please see AMION for all Pharmacists' Contact Phone Numbers 02/04/2020, 7:16 AM

## 2020-02-04 NOTE — Transfer of Care (Signed)
Immediate Anesthesia Transfer of Care Note  Patient: Deanna Cunningham  Procedure(s) Performed: ESOPHAGOGASTRODUODENOSCOPY (EGD) WITH PROPOFOL (Left ) SAVORY DILATION (N/A )  Patient Location: PACU, endo  Anesthesia Type:MAC  Level of Consciousness: awake, alert  and oriented  Airway & Oxygen Therapy: Patient Spontanous Breathing and Patient connected to nasal cannula oxygen  Post-op Assessment: Report given to RN and Post -op Vital signs reviewed and stable  Post vital signs: Reviewed and stable  Last Vitals:  Vitals Value Taken Time  BP 124/78   Temp    Pulse 96   Resp 25 02/04/20 1327  SpO2 100   Vitals shown include unvalidated device data.  Last Pain:  Vitals:   02/04/20 1222  TempSrc: Oral  PainSc: 0-No pain      Patients Stated Pain Goal: 0 (31/43/88 8757)  Complications: No apparent anesthesia complications

## 2020-02-04 NOTE — Progress Notes (Signed)
Family Medicine Teaching Service Daily Progress Note Intern Pager: [phone]  Patient name: Deanna Cunningham Medical record number: [id number] Date of birth: 1945/11/20 Age: 74 y.o. Gender: female  Primary Care Provider: Kristie Cowman, MD Consultants: TOC,  Code Status: Full Code   Pt Overview and Major Events to Date:  Hospital Day: 54 12/12/2019: admitted for Weakness and Abnormal Lab 12/18/19 PICC line placed  4/19-4/23 macrobid for UTI  5/6 albumin 2.0 5/13 albumin 2.5 5/14 TPN stopped  Assessment and Plan: Deanna Cunningham is a 74 y.o. female who presented w/ global weakness, weight loss, failure to thrive.  Past medical history significant for malnutrition, rheumatoid arthritis (nave to care), chronic anemia, GERD.   FTT Presbyesophagus, esophageal dysmotility GERD Most recent labs on 5/17: alb 2.5, 2.6 on 5/16, Na 136, K 3.7, gluc 90, Cr 0.54, phosp 4.5, Mg 1.5, Alb LFTs wnl Continues prostat tid and boost tid. Restarted TPN on 5/17 MWF 500 calories. Seen by GI on 5/17, will proceed with EGD today  -GI following, appreciate recommendations  -Labs every Monday and Thursday -Continue MWF TPN  -Appreciate dietitian recs -Daily weights -Continue pepcid 20mg  bid, protonix 40mg , sucralfate with meals and at bedtime, thiamine 100mg  daily -Compazine q 6hour n/v, GI cocktail -Follow up discharge plan per CSW -Lovenox held prior to EGD today, restart after procedure   Generalized Weakness  Likely deconditioned in setting of long hospital stay and poor nutritional status  -continue PT/OT -DME recs ordered  Bifascicular block Moderate MR/TR, stable Last EKG 12/27/2019, QTc 479 -Avoid QT prolonging medications  Anemia, stable Hb 8.6 on 5/17. Stable  Required transfusions during this admission. - Continue CBC Mondays and Thursdays  Rheumatoid arthritis, chronic -Follows with rheumatology outpatient  FEN/GI:  . Fluids: N.S 67ml/hour  . Electrolytes: Replete PRN   . Nutrition: Dysphagia 1, TPN  Access: PICC, placed 12/19/2019 VTE prophylaxis: Lovenox on hold, restart EGD  Disposition: SNF which offers TPN   Subjective:  No acute events overnight. Will discuss disposition after EGD.  Objective: Temp:  [97.9 F (36.6 C)-98.5 F (36.9 C)] 98.2 F (36.8 C) (05/19 0734) Pulse Rate:  [83-95] 86 (05/19 0734) Resp:  [14-17] 14 (05/19 0734) BP: (103-117)/(56-68) 104/57 (05/19 0734) SpO2:  [98 %-100 %] 98 % (05/19 0734) Intake/Output      05/18 0701 - 05/19 0700 05/19 0701 - 05/20 0700   P.O. 300    I.V. (mL/kg) 453.5 (10.9)    Total Intake(mL/kg) 753.5 (18.1)    Urine (mL/kg/hr) 2575 (2.6)    Total Output 2575    Net -1821.6             Physical Exam: General: Alert, frail appearing 74 yr old female, no acute distress Cardio: Normal S1 and S2, RRR Pulm: CTAB, normal WOB Abdomen: Bowel sounds normal. Abdomen soft and non-tender.  Extremities: No peripheral edema. Warm/ well perfused.  Neuro: Cranial nerves grossly intact  CBC: Recent Labs  Lab 01/29/20 0418 02/02/20 0442  WBC 4.6 4.6  NEUTROABS  --  2.6  HGB 8.3* 8.6*  HCT 26.8* 28.0*  MCV 98.5 100.4*  PLT 222 227   CMP: Recent Labs  Lab 01/29/20 0418 01/29/20 0418 02/01/20 0445 02/02/20 0442 02/03/20 0432  NA 136   < > 135 136 137  K 4.3   < > 3.6 3.7 3.7  CL 104   < > 103 106 107  CO2 27   < > 25 25 23   GLUCOSE 102*   < > 109*  90 103*  BUN 30*   < > 26* 26* 40*  CREATININE 0.58   < > 0.58 0.54 0.52  CALCIUM 9.0   < > 9.4 9.3 9.0  MG 1.9  --   --  1.5* 1.8  PHOS 4.4  --   --  4.5 3.9  ALBUMIN 2.5*  --  2.6* 2.5*  --    < > = values in this interval not displayed.   CBG: No results for input(s): GLUCAP in the last 168 hours. Micro: Covid Negative  Recent Results (from the past 240 hour(s))  SARS CORONAVIRUS 2 (TAT 6-24 HRS) Nasopharyngeal Nasopharyngeal Swab     Status: None   Collection Time: 02/02/20  9:32 PM   Specimen: Nasopharyngeal Swab  Result Value  Ref Range Status   SARS Coronavirus 2 NEGATIVE NEGATIVE Final    Comment: (NOTE) SARS-CoV-2 target nucleic acids are NOT DETECTED. The SARS-CoV-2 RNA is generally detectable in upper and lower respiratory specimens during the acute phase of infection. Negative results do not preclude SARS-CoV-2 infection, do not rule out co-infections with other pathogens, and should not be used as the sole basis for treatment or other patient management decisions. Negative results must be combined with clinical observations, patient history, and epidemiological information. The expected result is Negative. Fact Sheet for Patients: SugarRoll.be Fact Sheet for Healthcare Providers: https://www.woods-mathews.com/ This test is not yet approved or cleared by the [place] FDA and  has been authorized for detection and/or diagnosis of SARS-CoV-2 by FDA under an Emergency Use Authorization (EUA). This EUA will remain  in effect (meaning this test can be used) for the duration of the COVID-19 declaration under Section 56 4(b)(1) of the Act, 21 U.S.C. section 360bbb-3(b)(1), unless the authorization is terminated or revoked sooner. Performed at [facility], [place] [address] [place], [place] [address]      Imaging/Diagnostic Tests: No results found.  EKG Interpretation  Date/Time:  Friday December 12 2019 13:08:15 EDT Ventricular Rate:  99 PR Interval:  168 QRS Duration: 132 QT Interval:  402 QTC Calculation: 515 R Axis:   -66 Text Interpretation: Sinus rhythm with occasional Premature ventricular complexes and Premature atrial complexes Left axis deviation Right bundle branch block Anterior infarct , age undetermined Abnormal ECG Porlonged QTc, no STEMI , similar to prior ecg pattern Confirmed by Octaviano Glow [phone]) on 12/12/2019 2:23:01 PM        Procedures:   Procedure Orders     Critical Care     Procedural/ Surgical Case Request:  ESOPHAGOGASTRODUODENOSCOPY (EGD) WITH PROPOFOL     ED EKG     EKG 12-Lead     EKG 12-Lead     EKG 12-Lead     EKG     EKG 12-Lead     EKG 12-Lead     EKG 12-Lead  [name], MD 02/04/2020, 7:58 AM PGY-3, [place] Intern pager: [phone], text pages welcome

## 2020-02-05 LAB — COMPREHENSIVE METABOLIC PANEL
ALT: 12 U/L (ref 0–44)
AST: 13 U/L — ABNORMAL LOW (ref 15–41)
Albumin: 2.5 g/dL — ABNORMAL LOW (ref 3.5–5.0)
Alkaline Phosphatase: 53 U/L (ref 38–126)
Anion gap: 5 (ref 5–15)
BUN: 41 mg/dL — ABNORMAL HIGH (ref 8–23)
CO2: 24 mmol/L (ref 22–32)
Calcium: 8.9 mg/dL (ref 8.9–10.3)
Chloride: 106 mmol/L (ref 98–111)
Creatinine, Ser: 0.59 mg/dL (ref 0.44–1.00)
GFR calc Af Amer: >60 mL/min (ref >60)
GFR calc non Af Amer: >60 mL/min (ref >60)
Glucose, Bld: 103 mg/dL — ABNORMAL HIGH (ref 70–99)
Potassium: 4.3 mmol/L (ref 3.5–5.1)
Sodium: 135 mmol/L (ref 135–145)
Total Bilirubin: <0.1 mg/dL — ABNORMAL LOW (ref 0.3–1.2)
Total Protein: 6.5 g/dL (ref 6.5–8.1)

## 2020-02-05 LAB — MAGNESIUM: Magnesium: 1.7 mg/dL (ref 1.7–2.4)

## 2020-02-05 LAB — PHOSPHORUS: Phosphorus: 4.2 mg/dL (ref 2.5–4.6)

## 2020-02-05 MED ORDER — ENOXAPARIN SODIUM 30 MG/0.3ML ~~LOC~~ SOLN
30.0000 mg | Freq: Every day | SUBCUTANEOUS | Status: DC
  Administered 2020-02-05 – 2020-02-10 (×6): 30 mg via SUBCUTANEOUS
  Filled 2020-02-05 (×6): qty 0.3

## 2020-02-05 NOTE — Progress Notes (Signed)
New orders for covid test placed by provider, covid swab collected and labeled. Covid swab left with night shift nurse Writer.

## 2020-02-05 NOTE — Progress Notes (Signed)
Subjective: Minimal soreness in her throat.  Objective: Vital signs in last 24 hours: Temp:  [97.6 F (36.4 C)-98.7 F (37.1 C)] 98.1 F (36.7 C) (05/20 0355) Pulse Rate:  [85-96] 92 (05/20 0355) Resp:  [14-19] 14 (05/20 0355) BP: (104-129)/(50-73) 106/50 (05/20 0355) SpO2:  [96 %-100 %] 96 % (05/20 0355) Last BM Date: 02/03/20  Intake/Output from previous day: 05/19 0701 - 05/20 0700 In: 210 [I.V.:210] Out: 250 [Urine:250] Intake/Output this shift: No intake/output data recorded.  General appearance: alert, cachectic and no distress GI: soft, non-tender; bowel sounds normal; no masses,  no organomegaly  Lab Results: No results for input(s): WBC, HGB, HCT, PLT in the last 72 hours. BMET Recent Labs    02/03/20 0432 02/05/20 0534  NA 137 135  K 3.7 4.3  CL 107 106  CO2 23 24  GLUCOSE 103* 103*  BUN 40* 41*  CREATININE 0.52 0.59  CALCIUM 9.0 8.9   LFT Recent Labs    02/05/20 0534  PROT 6.5  ALBUMIN 2.5*  AST 13*  ALT 12  ALKPHOS 53  BILITOT <0.1*   PT/INR No results for input(s): LABPROT, INR in the last 72 hours. Hepatitis Panel No results for input(s): HEPBSAG, HCVAB, HEPAIGM, HEPBIGM in the last 72 hours. C-Diff No results for input(s): CDIFFTOX in the last 72 hours. Fecal Lactopherrin No results for input(s): FECLLACTOFRN in the last 72 hours.  Studies/Results: No results found.  Medications:  Scheduled: . Chlorhexidine Gluconate Cloth  6 each Topical Daily  . famotidine  20 mg Oral BID  . feeding supplement  1 Container Oral TID BM  . feeding supplement (PRO-STAT SUGAR FREE 64)  30 mL Oral TID  . mouth rinse  15 mL Mouth Rinse BID  . multivitamin with minerals  1 tablet Oral Daily  . pantoprazole  40 mg Oral Daily  . sodium chloride flush  10-40 mL Intracatheter Q12H  . sucralfate  1 g Oral TID WC & HS  . thiamine  100 mg Oral Daily   Continuous: . fluconazole (DIFLUCAN) IV 100 mg (02/04/20 1658)    Assessment/Plan: 1) Cervical  esophageal web. 2) Heavy Candidal esophagitis infection. 3) Severe malnutrition.   She will be advanced to a puree diet.  It is not clear if the cervical esophageal web or the candidal esophagitis or a combination of both issues were causing her dysphagia.  However, she does have a very long history of dysphagia issues predating 2012.    Plan: 1) Advance to a puree diet.  LOS: 55 days   Grayden Burley D 02/05/2020, 7:13 AM

## 2020-02-05 NOTE — Progress Notes (Signed)
PHARMACY - TOTAL PARENTERAL NUTRITION CONSULT NOTE  Indication: malnutrition  Patient Measurements: Height: 5\' 4"  (162.6 cm) Weight: 41.6 kg (91 lb 11.4 oz) IBW/kg (Calculated) : 54.7 TPN AdjBW (KG): 42.2 Body mass index is 15.74 kg/m.  Assessment:  42 YOF initially presented on 3/26 for weakness. Patient with PMH of RA, chronic anemia and GERD. She has had ongoing poor intake since Dec 2020 with LE swelling and unintentional weight loss.  Known esophageal dysmotility, presbyesophagus.  Pharmacy consulted to restart TPN  Discussed with patient/brother regarding risk/benefit/indication of TPN and relaying ASPEN guidelines indicate that TPN could be discontinued once patient is meeting > 60% of needs. Risk of overfeeding include increased respiratory workload, hepatic steatosis that could progress to fibrosis or cirrhosis. Brother acknowledges and wishes to continue TPN until albumin is at least 3.0  Glucose / Insulin: CBGs well controlled, SSI dc'd Electrolytes: all WNL  (CoCa high normal at 9.9) Renal: SCr stable, BUN up to 40 LFTs / TGs: LFTs / tbili / TG WNL Prealbumin / albumin: prealbumin increased to WNL at 22.9, albumin 2.5 Intake / Output; MIVF: SCr 0.59, UOP 0.8 ml/kg/hr, LBM 5/18 GI Imaging:  3/31 MBS - unable to pass tablet, esophageal dysmotility, dilation and narrowing  4/1 CT abd pelvis - anasarca and ascites; likely underlying liver disease Surgeries / Procedures:  5/19 EGD with dilation   Central access: PICC 12/18/19 TPN start date: 12/22/2019>>4/10; 4/16>>5/14; 5/17 >>  Nutritional Goals (per RD rec on 5/17): kCal: 1750-1950 per day, Protein: 90-105 g/day, Fluid: > 1.7 L  Each TPN bag to provide 500kCal and 75gm AA per discussion with MD on 5/17 (weekly avg of 32g AA per day).   Current Nutrition:  Dysphagia 1 diet - good appetite per patient Prostat TID - 100 kcal, 15 g protein (each) x 3 yesterday Boost Breeze TID - taking consistently, each supplement provides  250kcal and 9g protein x 3 yesterday  Plan:  NO TPN today, providing cyclic TPN on MWF only per MD Cycle 966ml over 12 hrs: 44 mL/hr x 1 hr, 87 mL/hr x 10 hr, then 44 mL/hr x 1 hr  Each TPN bag will provide 75g AA and 512 kCal Oral supplements and dysphagia diet meet 100% of kCal and 91% of protein need as of 5/17 TPN and oral intake is meeting > 100% of patient needs Lytes in TPN: increased Na/Mag and reduced K/Phos from standard, Cl:Ac 1:1 Continue daily PO multivitamin (no multivitamin and trace elements in TPN) Monitor weight, albumin and re-assess TPN provision as needed  Thank you for allowing pharmacy to be a part of this patient's care.  Alycia Rossetti, PharmD, BCPS Clinical Pharmacist Clinical phone for 02/05/2020: [phone] 02/05/2020 8:34 AM   **Pharmacist phone directory can now be found on [email or web address] ([email or web address]).  Listed under [place].

## 2020-02-05 NOTE — Progress Notes (Signed)
Physical Therapy Treatment Patient Details Name: Deanna Cunningham MRN: [id number] DOB: [birth year] Today's Date: 02/05/2020    History of Present Illness Pt is a 74 y.o. female admitted 12/12/19 with progressive weakness, poor PO intake and BLE swelling; worked up for malnutrition (concern for malignancy). Admitted with severe protein calorie malnutrition In the setting of known esophageal dysmotility. Pt also with anemia, electrolyte imbalance, stage 1 sacral wound. Pt with recurrent asymptomatic SVT; may occur in the settings of severe malnutrition and electrolyte imbalance. PMH includes dysphagia, CKD III, anemia, RA.    PT Comments    Pt in bed upon arrival of PT, agreeable to PT session with focus on progressing functional exercises and strengthening. The pt was able to demo improvements in functional strength and endurance for sit-stand transfers from various surfaces in her room both with and without use of RW. The pt was able to complete with only supervision and good use of BUE to push from bed and steady upon standing without AD. The pt was also able to demo good tolerance for multiple general LE strengthening exercises that target muscles she uses for functional transfers, walking, and stair navigation. The pt continues to present with limitations in functional strength, power, and endurance that limit her ability to mobilize safely and independently, and will therefore continue to benefit from skilled PT.     Follow Up Recommendations  SNF;Supervision/Assistance - 24 hour     Equipment Recommendations  3in1 (PT)    Recommendations for Other Services       Precautions / Restrictions Precautions Precautions: Fall;Other (comment) Precaution Comments: Bilateral hand/finger RA, sacral wound, rectal prolapse (occurred 4/16) Restrictions Weight Bearing Restrictions: No    Mobility  Bed Mobility Overal bed mobility: Modified Independent             General bed mobility  comments: pt able to mobilize and reposition in bed without assist. no concerns  Transfers Overall transfer level: Needs assistance Equipment used: None Transfers: Sit to/from Stand Sit to Stand: Supervision         General transfer comment: pt able to stand from various surfaces in room x 15 through session (5xSTS from bed in 38 sec initially, 41 sec at end of session), good use of UE to push from bed. able to stabilize without AD  Ambulation/Gait Ambulation/Gait assistance: Min assist;Mod assist Gait Distance (Feet): 15 Feet(x6 in room) Assistive device: None;Rolling walker (2 wheeled) Gait Pattern/deviations: Step-through pattern;Decreased stride length;Narrow base of support;Decreased dorsiflexion - left;Staggering right;Staggering left;Decreased dorsiflexion - right Gait velocity: decreased Gait velocity interpretation: <1.31 ft/sec, indicative of household ambulator General Gait Details: pt completed multiple passes of walking in room both with and without RW, completing STS transfers to different surfaces after each short bout of walking. pt with significantly improved stability with RW, drifts R/L with episodes of scissoring when ambulating without AD, utilizes more cautious, almost high guard position.   Stairs             Wheelchair Mobility    Modified Rankin (Stroke Patients Only)       Balance Overall balance assessment: Needs assistance Sitting-balance support: Feet supported;No upper extremity supported Sitting balance-Leahy Scale: Good Sitting balance - Comments: Can reach bilateral feet to fix socks    Standing balance support: No upper extremity supported Standing balance-Leahy Scale: Fair Standing balance comment: minA for stability completing sit-stands without AD, pt able to bend and reach to positions linnens on bed without UE support  Cognition Arousal/Alertness: Awake/alert Behavior During Therapy: WFL for  tasks assessed/performed Overall Cognitive Status: Within Functional Limits for tasks assessed                                 General Comments: discussed importance of voicing her goals with therapy and other rehab services. Pt with improved independence with tasks during session, good safey awareness demonstrated with movement      Exercises General Exercises - Lower Extremity Long Arc Quad: Both;10 reps;Seated;Strengthening(against manual resistance for knee flexion and extension) Hip Flexion/Marching: Both;Standing(for endurance, completed 38 high knee steps in 1 min) Heel Raises: AROM;Both;Standing;20 reps Other Exercises Other Exercises: x5 STS (completed x3 through session)    General Comments        Pertinent Vitals/Pain Pain Assessment: Faces Faces Pain Scale: Hurts a little bit Pain Location: L Foot at pressure sore Pain Descriptors / Indicators: Guarding Pain Intervention(s): Limited activity within patient's tolerance;Monitored during session    Home Living                      Prior Function            PT Goals (current goals can now be found in the care plan section) Acute Rehab PT Goals Patient Stated Goal: pt wanting to go home now PT Goal Formulation: With patient Time For Goal Achievement: 02/05/20 Potential to Achieve Goals: Fair Progress towards PT goals: Progressing toward goals    Frequency    Min 2X/week      PT Plan Current plan remains appropriate    Co-evaluation              AM-PAC PT "6 Clicks" Mobility   Outcome Measure  Help needed turning from your back to your side while in a flat bed without using bedrails?: None Help needed moving from lying on your back to sitting on the side of a flat bed without using bedrails?: None Help needed moving to and from a bed to a chair (including a wheelchair)?: A Little Help needed standing up from a chair using your arms (e.g., wheelchair or bedside chair)?: A  Little Help needed to walk in hospital room?: A Little Help needed climbing 3-5 steps with a railing? : A Little 6 Click Score: 20    End of Session Equipment Utilized During Treatment: Gait belt Activity Tolerance: Patient tolerated treatment well;Patient limited by fatigue Patient left: with call bell/phone within reach;in chair;with chair alarm set Nurse Communication: Mobility status PT Visit Diagnosis: Other abnormalities of gait and mobility (R26.89);Muscle weakness (generalized) (M62.81);Difficulty in walking, not elsewhere classified (R26.2) Pain - Right/Left: (bilateral) Pain - part of body: Leg;Ankle and joints of foot     Time: [id number] PT Time Calculation (min) (ACUTE ONLY): 28 min  Charges:  $Gait Training: 8-22 mins $Therapeutic Exercise: 8-22 mins                     Karma Ganja, PT, DPT   Acute Rehabilitation Department Pager #: [phone]   Otho Bellows 02/05/2020, 2:34 PM

## 2020-02-05 NOTE — Progress Notes (Signed)
Family Medicine Teaching Service Daily Progress Note Intern Pager: [phone]  Patient name: Deanna Cunningham Medical record number: [id number] Date of birth: [birth year] Age: 74 y.o. Gender: female  Primary Care Provider: Kristie Cowman, MD Consultants: TOC,  Code Status: Full Code   Pt Overview and Major Events to Date:  Hospital Day: 56 12/12/2019: admitted for Weakness and Abnormal Lab 12/18/19 PICC line placed  4/19-4/23 macrobid for UTI  5/6 albumin 2.0 5/13 albumin 2.5 5/14 TPN stopped  Assessment and Plan: Deanna Cunningham is a 74 y.o. female who presented w/ global weakness, weight loss, failure to thrive.  Past medical history significant for malnutrition, rheumatoid arthritis (nave to care), chronic anemia, GERD.   FTT Presbyesophagus, esophageal dysmotility, esophageal candidiasis  GERD Most recent labs on 5/20: alb 2.5, 2, Na 135, K 4.3, gluc 178, Cr 0.59 , phosp 4.2, Mg 1.7, AST 13, ALT 12, ALP 53. Continues prostat tid and boost tid. Restarted TPN on 5/17 MWF 500 calories. EGD on 5/19: Heavy Candidal esophagitis infection and esophageal stricture. GI recommends repeat dilatation in 2 weeks time. Will plan to d/c home or SNF in interim. After discussion with patient today she would like to see how she is able to eat today before making a final decision on disposition. She expressed it would more likely be a SNF Vs going home as she lives alone. -GI following, appreciate recommendations -Diflucan 100mg  IV once daily   -Dysphagia 1 diet  -Labs every Monday and Thursday -Continue MWF TPN  -Appreciate dietitian recs -Daily weights -Continue pepcid 20mg  bid, protonix 40mg , sucralfate with meals and at bedtime, thiamine 100mg  daily -Compazine 10mg  Q6PRN -Follow up discharge plan per CSW -Restart Lovenox after procedure   Generalized Weakness  Likely deconditioned in setting of long hospital stay and poor nutritional status  -continue PT/OT -DME recs  ordered  Bifascicular block Moderate MR/TR, stable Last EKG 12/27/2019, QTc 479 -Avoid QT prolonging medications  Anemia, stable Hb 8.6 on 5/17. Stable  Required transfusions during this admission. - Continue CBC Mondays and Thursdays  Rheumatoid arthritis, chronic -Follows with rheumatology outpatient  FEN/GI:  . Fluids: N.S 75ml/hour  . Electrolytes: Replete PRN  . Nutrition: Dysphagia 1, TPN  Access: PICC, placed 12/19/2019 VTE prophylaxis: Lovenox on hold, restart EGD  Disposition: SNF which offers TPN   Subjective:  No acute events overnight.   Objective: Temp:  [97.6 F (36.4 C)-98.7 F (37.1 C)] 98.1 F (36.7 C) (05/20 0355) Pulse Rate:  [85-96] 92 (05/20 0355) Resp:  [14-19] 14 (05/20 0355) BP: (106-129)/(50-73) 106/50 (05/20 0355) SpO2:  [96 %-100 %] 96 % (05/20 0355) Intake/Output      05/19 0701 - 05/20 0700 05/20 0701 - 05/21 0700   P.O. 0    I.V. (mL/kg) 210 (5)    Total Intake(mL/kg) 210 (5)    Urine (mL/kg/hr) 250 (0.3)    Total Output 250    Net -40             Physical Exam: General: Alert, frail appearing 74 yr old female, no acute distress, sitting up in bed eating Cardio: Normal S1 and S2,  RRR Pulm: CTAB, normal WOB Abdomen: Bowel sounds normal. Abdomen soft and non-tender.  Extremities: No peripheral edema. Warm/ well perfused.   Neuro: Cranial nerves grossly intact  CBC: Recent Labs  Lab 02/02/20 0442  WBC 4.6  NEUTROABS 2.6  HGB 8.6*  HCT 28.0*  MCV 100.4*  PLT 227   CMP: Recent Labs  Lab 02/01/20 0445  02/01/20 0445 02/02/20 0442 02/03/20 0432 02/05/20 0534  NA 135   < > 136 137 135  K 3.6   < > 3.7 3.7 4.3  CL 103   < > 106 107 106  CO2 25   < > 25 23 24   GLUCOSE 109*   < > 90 103* 103*  BUN 26*   < > 26* 40* 41*  CREATININE 0.58   < > 0.54 0.52 0.59  CALCIUM 9.4   < > 9.3 9.0 8.9  MG  --   --  1.5* 1.8 1.7  PHOS  --   --  4.5 3.9 4.2  ALBUMIN 2.6*  --  2.5*  --  2.5*   < > = values in this interval not  displayed.   CBG: No results for input(s): GLUCAP in the last 168 hours. Micro: Covid Negative  Recent Results (from the past 240 hour(s))  SARS CORONAVIRUS 2 (TAT 6-24 HRS) Nasopharyngeal Nasopharyngeal Swab     Status: None   Collection Time: 02/02/20  9:32 PM   Specimen: Nasopharyngeal Swab  Result Value Ref Range Status   SARS Coronavirus 2 NEGATIVE NEGATIVE Final    Comment: (NOTE) SARS-CoV-2 target nucleic acids are NOT DETECTED. The SARS-CoV-2 RNA is generally detectable in upper and lower respiratory specimens during the acute phase of infection. Negative results do not preclude SARS-CoV-2 infection, do not rule out co-infections with other pathogens, and should not be used as the sole basis for treatment or other patient management decisions. Negative results must be combined with clinical observations, patient history, and epidemiological information. The expected result is Negative. Fact Sheet for Patients: SugarRoll.be Fact Sheet for Healthcare Providers: https://www.woods-mathews.com/ This test is not yet approved or cleared by the [place] FDA and  has been authorized for detection and/or diagnosis of SARS-CoV-2 by FDA under an Emergency Use Authorization (EUA). This EUA will remain  in effect (meaning this test can be used) for the duration of the COVID-19 declaration under Section 56 4(b)(1) of the Act, 21 U.S.C. section 360bbb-3(b)(1), unless the authorization is terminated or revoked sooner. Performed at [facility], [place] [address] [place], [place] [address]      Imaging/Diagnostic Tests: No results found.  EKG Interpretation  Date/Time:  Friday December 12 2019 13:08:15 EDT Ventricular Rate:  99 PR Interval:  168 QRS Duration: 132 QT Interval:  402 QTC Calculation: 515 R Axis:   -66 Text Interpretation: Sinus rhythm with occasional Premature ventricular complexes and Premature atrial complexes Left  axis deviation Right bundle branch block Anterior infarct , age undetermined Abnormal ECG Porlonged QTc, no STEMI , similar to prior ecg pattern Confirmed by Octaviano Glow [phone]) on 12/12/2019 2:23:01 PM        Procedures:   Procedure Orders     Critical Care     Procedural/ Surgical Case Request: ESOPHAGOGASTRODUODENOSCOPY (EGD) WITH PROPOFOL     UPPER ENDOSCOPY     ED EKG     EKG 12-Lead     EKG 12-Lead     EKG 12-Lead     EKG     EKG 12-Lead     EKG 12-Lead     EKG 12-Lead  [name], MD 02/05/2020, 7:46 AM PGY-1, [place] Intern pager: [phone], text pages welcome

## 2020-02-05 NOTE — Progress Notes (Signed)
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  Ongoing  GOAL:   Patient will meet greater than or equal to 90% of their needs  Progressing   MONITOR:   PO intake, Supplement acceptance, Diet advancement, Labs, Weight trends, Skin, I & O's  REASON FOR ASSESSMENT:   Consult Assessment of nutrition requirement/status  ASSESSMENT:   Deanna Cunningham is a 74 y.o. female presenting with weakness. PMH is significant for Rheumatoid Arthritis (no tx, no rheumatologist), chronic anemia and GERD.  4/2- PICC placed 4/5- TPN initiated 4/10- TPN d/c 4/17- TPN restarted due to nausea, vomiting, poor oral intake 4/20- transitioned to cyclic TPN [id number]- TPN d/c 5/19- s/p EGD- revealed benign-appearing esophageal stenosis (dilated); esophageal plaques were found, consistent with candidiasis; 5 cm hiatal hernia.  Reviewed I/O's: -40 ml x 24 hours and -25.7 L since 01/22/20  UOP: 250 ml x 24 hours  Attempted to speak with pt via phone x 2, however, no answer.   Pt has resumed dysphagia 1 diet. Per MD notes, she complains of sore throat; was started on diflucan for candidal esophagitis infection. No meal completion documented over the past 48 hours.   TPN re-started on 01/31/20. Plan to provide cyclic TPN on Monday, Wednesday, and Friday only;Cycle 955ml over 12 hrs: 44 mL/hr x 1 hr, 87 mL/hr x  10 hr, then 44 mL/hr x 1 hr. Regimen to provide 512 kcals and 75 grams protein, meeting 29% of estimated kcal needs and 83% of estimated protein needs.Per pharmacy note, pt will not receive TPN today.  Medications reviewed and include thiamine and diflucan.   Labs reviewed.   Diet Order:   Diet Order            DIET - DYS 1 Room service appropriate? Yes; Fluid consistency: Thin  Diet effective now              EDUCATION NEEDS:   Education needs have been addressed  Skin:  Skin Assessment: Skin Integrity Issues: Skin Integrity Issues:: Stage II Stage II: coccyx- epithelialized Other: MASD to groin, labia, buttocks, and sacrum  Last BM:  02/03/20  Height:   Ht Readings from Last 1 Encounters:  01/17/20 5\' 4"  (1.626 m)    Weight:   Wt Readings from Last 1 Encounters:  02/02/20 41.6 kg    Ideal Body Weight:  54.5 kg  BMI:  Body mass index is 15.74 kg/m.  Estimated Nutritional Needs:   Kcal:  [id number]  Protein:  90-105 grams  Fluid:  > 1.7 L    [name], RD, LDN, [place] Registered Dietitian II Certified Diabetes Care and Education Specialist Please refer to [facility] for RD and/or RD on-call/weekend/after hours pager

## 2020-02-05 NOTE — Plan of Care (Signed)
  Problem: Nutrition: Goal: Adequate nutrition will be maintained Outcome: Progressing   

## 2020-02-05 NOTE — TOC Progression Note (Addendum)
Transition of Care [facility]) - Progression Note    Patient Details  Name: Deanna Cunningham MRN: [id number] Date of Birth: [birth year]  [facility] CM/SW Contact  Sharin Mons, RN Phone Number: 02/05/2020, 4:42 PM  Clinical Narrative:    NCM spoke with pt @ bedside with brother on speaker phone  regarding POC needs, SNF vs home health services . Brother encouraged pt to go to SNF 2/2 daughter working , pt still weak and 24/7 supervision /assistance along with TPN need. Pt verbalized she's ? agreeable to SNF placement , however , she still would like to speak with the home health infusion liaison before giving definitve answer and states she would like to speak with GI MD.  NCM reach out to [facility] infusion/ Pam to f/u with pt today.   NCM awaiting call from [facility] regarding bed offer ....  NCM also shared with pt call received from [facility] rep. stating [place] insurance will cover hospital stay thru 5/22, however,after denial begins 5/23. NCM also made daughter [name] aware.  TOC team to f/u in am .......   Expected Discharge Plan: [place] Barriers to Discharge: Continued Medical Work up  Expected Discharge Plan and Services Expected Discharge Plan: [place]   Discharge Planning Services: CM Consult   Living arrangements for the past 2 months: Single Family Home                                       Social Determinants of Health (SDOH) Interventions    Readmission Risk Interventions No flowsheet data found.

## 2020-02-06 LAB — SARS CORONAVIRUS 2 BY RT PCR (HOSPITAL ORDER, PERFORMED IN ~~LOC~~ HOSPITAL LAB): SARS Coronavirus 2: NEGATIVE

## 2020-02-06 MED ORDER — FLUCONAZOLE 100 MG PO TABS
100.0000 mg | ORAL_TABLET | Freq: Every day | ORAL | Status: AC
  Administered 2020-02-06 – 2020-02-10 (×5): 100 mg via ORAL
  Filled 2020-02-06 (×6): qty 1

## 2020-02-06 MED ORDER — MAGNESIUM SULFATE IN D5W 1-5 GM/100ML-% IV SOLN
1.0000 g | Freq: Once | INTRAVENOUS | Status: AC
  Administered 2020-02-06: 1 g via INTRAVENOUS
  Filled 2020-02-06: qty 100

## 2020-02-06 MED ORDER — TRAVASOL 10 % IV SOLN
INTRAVENOUS | Status: AC
  Filled 2020-02-06: qty 747.24

## 2020-02-06 MED ORDER — FLUCONAZOLE 100 MG PO TABS: 100.0000 mg | ORAL_TABLET | Freq: Every day | ORAL | Status: DC

## 2020-02-06 NOTE — Progress Notes (Signed)
Family Medicine Teaching Service Daily Progress Note Intern Pager: [phone]  Patient name: Deanna Cunningham Medical record number: [id number] Date of birth: [birth year] Age: 74 y.o. Gender: female  Primary Care Provider: Kristie Cowman, MD Consultants: TOC,  Code Status: Full Code   Pt Overview and Major Events to Date:  Hospital Day: 57 12/12/2019: admitted for Weakness and Abnormal Lab 12/18/19 PICC line placed  4/19-4/23 macrobid for UTI  5/6 albumin 2.0 5/13 albumin 2.5 5/14 TPN stopped  Assessment and Plan: Deanna Cunningham is a 74 y.o. female who presented w/ global weakness, weight loss, failure to thrive.  Past medical history significant for malnutrition, rheumatoid arthritis (nave to care), chronic anemia, GERD.   FTT Presbyesophagus, esophageal dysmotility, esophageal candidiasis  GERD Most recent labs on 5/20: alb 2.5, 2, Na 135, K 4.3, gluc 178, Cr 0.59 , phosp 4.2, Mg 1.7, AST 13, ALT 12, ALP 53. Continues prostat tid and boost tid. Restarted TPN on 5/17 MWF 500 calories. EGD on 5/19: Heavy Candidal esophagitis infection and esophageal stricture. GI recommends repeat dilatation in 2 weeks time. Will plan to d/c to SNF today. -GI following, appreciate recommendations -Diflucan 100mg  IV once daily   -Dysphagia 1 diet  -Labs every Monday and Thursday -Continue MWF TPN  -Appreciate dietitian recs -Daily weights -Continue pepcid 20mg  bid, protonix 40mg , sucralfate with meals and at bedtime, thiamine 100mg  daily -Compazine 10mg  Q6PRN -Follow up discharge plan per CSW -Lovenox for DVT ppx  Generalized Weakness  Likely deconditioned in setting of long hospital stay and poor nutritional status  -continue PT/OT -DME recs ordered  Bifascicular block Moderate MR/TR, stable Last EKG 12/27/2019, QTc 479 -Avoid QT prolonging medications  Anemia, stable Hb 8.6 on 5/17. Stable  Required transfusions during this admission. - Continue CBC Mondays and  Thursdays  Rheumatoid arthritis, chronic -Follows with rheumatology outpatient  FEN/GI:  . Fluids: N.S 15ml/hour  . Electrolytes: Replete PRN  . Nutrition: Dysphagia 1, TPN  Access: PICC, placed 12/19/2019 VTE prophylaxis: Lovenox on hold, restart EGD  Disposition: SNF as soon as bed is available   Subjective:  No acute events overnight. Wants to stay in hospital today for TPN.   Objective: Temp:  [97.8 F (36.6 C)-98.4 F (36.9 C)] 98.4 F (36.9 C) (05/21 0429) Pulse Rate:  [86-93] 91 (05/21 0429) Resp:  [14-16] 16 (05/21 0429) BP: (106-115)/(55-66) 115/55 (05/21 0429) SpO2:  [94 %-100 %] 100 % (05/21 0429) Intake/Output      05/20 0701 - 05/21 0700 05/21 0701 - 05/22 0700   P.O. 480    I.V. (mL/kg)     IV Piggyback 72.4    Total Intake(mL/kg) 552.4 (13.3)    Urine (mL/kg/hr) 1750 (1.8)    Stool 0    Total Output 1750    Net -1197.6         Urine Occurrence 1 x    Stool Occurrence 1 x        Physical Exam: General: frail 74 yr old female sitting up in bed, no acute distress Cardio: S1 and S2 present, RRR Pulm: CTAB, normal WOB  Abdomen: soft, non tender, bowel sounds present  Extremities: no peripheral edema  Neuro: cranial nerves grossly in tact  CBC: Recent Labs  Lab 02/02/20 0442  WBC 4.6  NEUTROABS 2.6  HGB 8.6*  HCT 28.0*  MCV 100.4*  PLT 227   CMP: Recent Labs  Lab 02/01/20 0445 02/01/20 0445 02/02/20 0442 02/03/20 0432 02/05/20 0534  NA 135   < >  136 137 135  K 3.6   < > 3.7 3.7 4.3  CL 103   < > 106 107 106  CO2 25   < > 25 23 24   GLUCOSE 109*   < > 90 103* 103*  BUN 26*   < > 26* 40* 41*  CREATININE 0.58   < > 0.54 0.52 0.59  CALCIUM 9.4   < > 9.3 9.0 8.9  MG  --   --  1.5* 1.8 1.7  PHOS  --   --  4.5 3.9 4.2  ALBUMIN 2.6*  --  2.5*  --  2.5*   < > = values in this interval not displayed.   CBG: No results for input(s): GLUCAP in the last 168 hours. Micro: Covid Negative  Recent Results (from the past 240 hour(s))  SARS  CORONAVIRUS 2 (TAT 6-24 HRS) Nasopharyngeal Nasopharyngeal Swab     Status: None   Collection Time: 02/02/20  9:32 PM   Specimen: Nasopharyngeal Swab  Result Value Ref Range Status   SARS Coronavirus 2 NEGATIVE NEGATIVE Final    Comment: (NOTE) SARS-CoV-2 target nucleic acids are NOT DETECTED. The SARS-CoV-2 RNA is generally detectable in upper and lower respiratory specimens during the acute phase of infection. Negative results do not preclude SARS-CoV-2 infection, do not rule out co-infections with other pathogens, and should not be used as the sole basis for treatment or other patient management decisions. Negative results must be combined with clinical observations, patient history, and epidemiological information. The expected result is Negative. Fact Sheet for Patients: SugarRoll.be Fact Sheet for Healthcare Providers: https://www.woods-mathews.com/ This test is not yet approved or cleared by the [place] FDA and  has been authorized for detection and/or diagnosis of SARS-CoV-2 by FDA under an Emergency Use Authorization (EUA). This EUA will remain  in effect (meaning this test can be used) for the duration of the COVID-19 declaration under Section 56 4(b)(1) of the Act, 21 U.S.C. section 360bbb-3(b)(1), unless the authorization is terminated or revoked sooner. Performed at [facility], [place] [address], [place], [place] [address]   SARS Coronavirus 2 by RT PCR (hospital order, performed in [facility] hospital lab) Nasopharyngeal Nasopharyngeal Swab     Status: None   Collection Time: 02/05/20 10:52 PM   Specimen: Nasopharyngeal Swab  Result Value Ref Range Status   SARS Coronavirus 2 NEGATIVE NEGATIVE Final    Comment: (NOTE) SARS-CoV-2 target nucleic acids are NOT DETECTED. The SARS-CoV-2 RNA is generally detectable in upper and lower respiratory specimens during the acute phase of infection. The lowest concentration of  SARS-CoV-2 viral copies this assay can detect is 250 copies / mL. A negative result does not preclude SARS-CoV-2 infection and should not be used as the sole basis for treatment or other patient management decisions.  A negative result may occur with improper specimen collection / handling, submission of specimen other than nasopharyngeal swab, presence of viral mutation(s) within the areas targeted by this assay, and inadequate number of viral copies (<250 copies / mL). A negative result must be combined with clinical observations, patient history, and epidemiological information. Fact Sheet for Patients:   StrictlyIdeas.no Fact Sheet for Healthcare Providers: BankingDealers.co.za This test is not yet approved or cleared  by the [place] FDA and has been authorized for detection and/or diagnosis of SARS-CoV-2 by FDA under an Emergency Use Authorization (EUA).  This EUA will remain in effect (meaning this test can be used) for the duration of the COVID-19 declaration  under Section 564(b)(1) of the Act, 21 U.S.C. section 360bbb-3(b)(1), unless the authorization is terminated or revoked sooner. Performed at [facility], [place] [address] [place], [place] [address]      Imaging/Diagnostic Tests: No results found.  EKG Interpretation  Date/Time:  Friday December 12 2019 13:08:15 EDT Ventricular Rate:  99 PR Interval:  168 QRS Duration: 132 QT Interval:  402 QTC Calculation: 515 R Axis:   -66 Text Interpretation: Sinus rhythm with occasional Premature ventricular complexes and Premature atrial complexes Left axis deviation Right bundle branch block Anterior infarct , age undetermined Abnormal ECG Porlonged QTc, no STEMI , similar to prior ecg pattern Confirmed by Octaviano Glow [phone]) on 12/12/2019 2:23:01 PM        Procedures:   Procedure Orders     Critical Care     Procedural/ Surgical Case Request:  ESOPHAGOGASTRODUODENOSCOPY (EGD) WITH PROPOFOL     UPPER ENDOSCOPY     ED EKG     EKG 12-Lead     EKG 12-Lead     EKG 12-Lead     EKG     EKG 12-Lead     EKG 12-Lead     EKG 12-Lead  [name], MD 02/06/2020, 7:58 AM PGY-1, [place] Intern pager: [phone], text pages welcome

## 2020-02-06 NOTE — Progress Notes (Signed)
During assessment RN noticed wounds on pt's both heels with some drainage. Wounds were cleansed with normal saline; xeroform and gauze were applied. Order for wound care consult was placed.

## 2020-02-06 NOTE — TOC Progression Note (Addendum)
Transition of Care [facility]) - Progression Note    Patient Details  Name: Deanna Cunningham MRN: [id number] Date of Birth: 02/07/1946  [facility]) CM/SW Contact  Sharin Mons, RN Phone Number: [phone]   Clinical Narrative:     Pt accepted SNF bed offer for [facility]. Insurance authorization pending. NCM has requested Rx for TPN from MD. Per admissions liaison [facility] pharmacy needs 24 hrs to prepare for TPN. Updated COVID noted for 5/20. TOC team will continue to monitor...   Expected Discharge Plan: Skilled Nursing Facility([facility]) Barriers to Discharge: Ship broker, Continued Medical Work up  Expected Discharge Plan and Services Expected Discharge Plan: Skilled Nursing Facility([facility])   Discharge Planning Services: CM Consult   Living arrangements for the past 2 months: Single Family Home                                       Social Determinants of Health (SDOH) Interventions    Readmission Risk Interventions No flowsheet data found.

## 2020-02-06 NOTE — Plan of Care (Signed)
  Problem: Education: Goal: Knowledge of General Education information will improve Description: Including pain rating scale, medication(s)/side effects and non-pharmacologic comfort measures Outcome: Progressing   Problem: Health Behavior/Discharge Planning: Goal: Ability to manage health-related needs will improve Outcome: Progressing   Problem: Clinical Measurements: Goal: Ability to maintain clinical measurements within normal limits will improve Outcome: Progressing Goal: Will remain free from infection Outcome: Progressing Goal: Diagnostic test results will improve Outcome: Progressing Goal: Respiratory complications will improve Outcome: Progressing Goal: Cardiovascular complication will be avoided Outcome: Progressing   Problem: Activity: Goal: Risk for activity intolerance will decrease Outcome: Progressing   Problem: Nutrition: Goal: Adequate nutrition will be maintained Outcome: Progressing   Problem: Coping: Goal: Level of anxiety will decrease Outcome: Progressing   Problem: Elimination: Goal: Will not experience complications related to bowel motility Outcome: Progressing   Problem: Safety: Goal: Ability to remain free from injury will improve Outcome: Progressing   Problem: Skin Integrity: Goal: Risk for impaired skin integrity will decrease Outcome: Progressing

## 2020-02-06 NOTE — Progress Notes (Signed)
Occupational Therapy Treatment Patient Details Name: Deanna Cunningham MRN: [id number] DOB: Dec 25, 1945 Today's Date: 02/06/2020    History of present illness Pt is a 74 y.o. female admitted 12/12/19 with progressive weakness, poor PO intake and BLE swelling; worked up for malnutrition (concern for malignancy). Admitted with severe protein calorie malnutrition In the setting of known esophageal dysmotility. Pt also with anemia, electrolyte imbalance, stage 1 sacral wound. Pt with recurrent asymptomatic SVT; may occur in the settings of severe malnutrition and electrolyte imbalance. PMH includes dysphagia, CKD III, anemia, RA.   OT comments  Pt making steady progress towards OT goals this session. Pt with improved activity tolerance this session able to complete household distance functional mobility with RW and MIN guard assist. Pt complete standing grooming task at sink with no UE support with close supervision. DC plan remains appropriate, will continue to follow acutely per POC.    Follow Up Recommendations  Home health OT;Supervision/Assistance - 24 hour    Equipment Recommendations  3 in 1 bedside commode;Tub/shower bench    Recommendations for Other Services      Precautions / Restrictions Precautions Precautions: Fall;Other (comment) Precaution Comments: Bilateral hand/finger RA, sacral wound, rectal prolapse (occurred 4/16) Restrictions Weight Bearing Restrictions: No       Mobility Bed Mobility Overal bed mobility: Modified Independent Bed Mobility: Supine to Sit;Sit to Supine     Supine to sit: Modified independent (Device/Increase time) Sit to supine: Modified independent (Device/Increase time)   General bed mobility comments: no physical assist needed; use of bed features and increased time  Transfers Overall transfer level: Needs assistance Equipment used: Rolling walker (2 wheeled) Transfers: Sit to/from Stand Sit to Stand: Supervision         General  transfer comment: supervision for safety sit<>stand from EOB and recliner    Balance Overall balance assessment: Needs assistance Sitting-balance support: Feet supported;No upper extremity supported Sitting balance-Leahy Scale: Good     Standing balance support: No upper extremity supported Standing balance-Leahy Scale: Aleigh Grunden Standing balance comment: able to stand at sink with no UE support for ADLs with close supervision                           ADL either performed or assessed with clinical judgement   ADL Overall ADL's : Needs assistance/impaired     Grooming: Wash/dry hands;Wash/dry face;Oral care;Standing;Supervision/safety Grooming Details (indicate cue type and reason): pt able to stand at sink ~ 13 mins with no UE support with close supervision     Lower Body Bathing: Minimal assistance;Sit to/from stand Lower Body Bathing Details (indicate cue type and reason): MIIN A to manage gown in standing while pt complete LB bathing         Toilet Transfer: RW;Min Psychiatric nurse Details (indicate cue type and reason): simulated via functional mobility; overall balance improves with RW         Functional mobility during ADLs: Min guard;Rolling walker General ADL Comments: pt with improved activity tolerance able to complete household distance functional mobility with RW and complete standing grooming tasks with supervision     Vision       Perception     Praxis      Cognition Arousal/Alertness: Awake/alert Behavior During Therapy: WFL for tasks assessed/performed Overall Cognitive Status: Within Functional Limits for tasks assessed  Exercises     Shoulder Instructions       General Comments pt c/o R foot pain; no redness or irritation noted    Pertinent Vitals/ Pain       Pain Assessment: Faces Faces Pain Scale: Hurts a little bit Pain Location: top of R foot Pain Descriptors /  Indicators: Discomfort Pain Intervention(s): Monitored during session;Repositioned  Home Living                                          Prior Functioning/Environment              Frequency  Min 2X/week        Progress Toward Goals  OT Goals(current goals can now be found in the care plan section)  Progress towards OT goals: Progressing toward goals  Acute Rehab OT Goals Patient Stated Goal: pt wanting to go home now OT Goal Formulation: With patient Time For Goal Achievement: 02/11/20 Potential to Achieve Goals: Good  Plan Discharge plan remains appropriate    Co-evaluation                 AM-PAC OT "6 Clicks" Daily Activity     Outcome Measure   Help from another person eating meals?: A Little Help from another person taking care of personal grooming?: None Help from another person toileting, which includes using toliet, bedpan, or urinal?: A Little Help from another person bathing (including washing, rinsing, drying)?: A Little Help from another person to put on and taking off regular upper body clothing?: None Help from another person to put on and taking off regular lower body clothing?: A Little 6 Click Score: 20    End of Session Equipment Utilized During Treatment: Rolling walker  OT Visit Diagnosis: Unsteadiness on feet (R26.81);Repeated falls (R29.6);Muscle weakness (generalized) (M62.81) Pain - Right/Left: Right   Activity Tolerance Patient tolerated treatment well   Patient Left in bed;with call bell/phone within reach;with bed alarm set   Nurse Communication          Time: [id number] OT Time Calculation (min): 29 min  Charges: OT General Charges $OT Visit: 1 Visit OT Treatments $Self Care/Home Management : 23-37 mins  Lanier Clam., COTA/L Acute Rehabilitation Services [phone]    Ihor Gully 02/06/2020, 1:25 PM

## 2020-02-06 NOTE — Plan of Care (Signed)
  Problem: Education: Goal: Knowledge of General Education information will improve Description: Including pain rating scale, medication(s)/side effects and non-pharmacologic comfort measures Outcome: Progressing   Problem: Activity: Goal: Risk for activity intolerance will decrease Outcome: Progressing   Problem: Nutrition: Goal: Adequate nutrition will be maintained Outcome: Progressing   

## 2020-02-06 NOTE — Progress Notes (Signed)
PHARMACY - TOTAL PARENTERAL NUTRITION CONSULT NOTE  Indication: malnutrition  Patient Measurements: Height: 5\' 4"  (162.6 cm) Weight: 41.6 kg (91 lb 11.4 oz) IBW/kg (Calculated) : 54.7 TPN AdjBW (KG): 42.2 Body mass index is 15.74 kg/m.  Assessment:  83 YOF initially presented on 3/26 for weakness. Patient with PMH of RA, chronic anemia and GERD. She has had ongoing poor intake since Dec 2020 with LE swelling and unintentional weight loss.  Known esophageal dysmotility, presbyesophagus.  Pharmacy consulted to restart TPN  Discussed with patient/brother regarding risk/benefit/indication of TPN and relaying ASPEN guidelines indicate that TPN could be discontinued once patient is meeting > 60% of needs. Risk of overfeeding include increased respiratory workload, hepatic steatosis that could progress to fibrosis or cirrhosis. Brother acknowledges and wishes to continue TPN until albumin is at least 3.0  Glucose / Insulin: CBGs well controlled, SSI dc'd Electrolytes: all WNL  (CoCa high normal at 9.9) Renal: SCr stable, BUN up to 40 LFTs / TGs: LFTs / tbili / TG WNL Prealbumin / albumin: prealbumin increased to WNL at 22.9, albumin 2.5 Intake / Output; MIVF: SCr 0.59, UOP 0.8 ml/kg/hr, LBM 5/18 GI Imaging:  3/31 MBS - unable to pass tablet, esophageal dysmotility, dilation and narrowing  4/1 CT abd pelvis - anasarca and ascites; likely underlying liver disease Surgeries / Procedures:  5/19 EGD with dilation   Central access: PICC 12/18/19 TPN start date: 12/22/2019>>4/10; 4/16>>5/14; 5/17 >>  Nutritional Goals (per RD rec on 5/17): kCal: 1750-1950 per day, Protein: 90-105 g/day, Fluid: > 1.7 L  Each TPN bag to provide 500kCal and 75gm AA per discussion with MD on 5/17 (weekly avg of 32g AA per day).   Current Nutrition:  Dysphagia 1 diet - good appetite per patient Prostat TID - 100 kcal, 15 g protein (each) x 3 yesterday Boost Breeze TID - taking consistently, each supplement provides  250kcal and 9g protein x 3 yesterday  Plan:  Cycle 956ml over 12 hrs: 44 mL/hr x 1 hr, 87 mL/hr x 10 hr, then 44 mL/hr x 1 hr  Mg 1g IV x 1 today Each TPN bag will provide 75g AA and 512 kCal Oral supplements and dysphagia diet meet 100% of kCal and 91% of protein needs TPN and oral intake is meeting > 100% of patient needs Lytes in TPN: increased Na/Mag and reduced K/Phos from standard, Cl:Ac 1:1 Continue daily PO multivitamin (no multivitamin and trace elements in TPN) Monitor weight, albumin and re-assess TPN provision as needed  Thank you for allowing pharmacy to be a part of this patient's care.  Alycia Rossetti, PharmD, BCPS Clinical Pharmacist Clinical phone for 02/06/2020: [phone] 02/06/2020 8:37 AM   **Pharmacist phone directory can now be found on [email or web address] ([email or web address]).  Listed under [place].

## 2020-02-06 NOTE — Progress Notes (Signed)
Subjective: She reports that she is swallowing well.  No dysphagia complaint.  Objective: Vital signs in last 24 hours: Temp:  [97.9 F (36.6 C)-98.4 F (36.9 C)] 98.2 F (36.8 C) (05/21 1230) Pulse Rate:  [86-93] 86 (05/21 1230) Resp:  [14-16] 14 (05/21 1230) BP: (102-115)/(55-66) 102/55 (05/21 1230) SpO2:  [100 %] 100 % (05/21 1230) Last BM Date: 02/03/20  Intake/Output from previous day: 05/20 0701 - 05/21 0700 In: 552.4 [P.O.:480; IV Piggyback:72.4] Out: 1750 [Urine:1750] Intake/Output this shift: Total I/O In: 240 [P.O.:240] Out: 650 [Urine:650]  General appearance: alert and no distress GI: soft, non-tender; bowel sounds normal; no masses,  no organomegaly  Lab Results: No results for input(s): WBC, HGB, HCT, PLT in the last 72 hours. BMET Recent Labs    02/05/20 0534  NA 135  K 4.3  CL 106  CO2 24  GLUCOSE 103*  BUN 41*  CREATININE 0.59  CALCIUM 8.9   LFT Recent Labs    02/05/20 0534  PROT 6.5  ALBUMIN 2.5*  AST 13*  ALT 12  ALKPHOS 53  BILITOT <0.1*   PT/INR No results for input(s): LABPROT, INR in the last 72 hours. Hepatitis Panel No results for input(s): HEPBSAG, HCVAB, HEPAIGM, HEPBIGM in the last 72 hours. C-Diff No results for input(s): CDIFFTOX in the last 72 hours. Fecal Lactopherrin No results for input(s): FECLLACTOFRN in the last 72 hours.  Studies/Results: No results found.  Medications:  Scheduled: . Chlorhexidine Gluconate Cloth  6 each Topical Daily  . enoxaparin (LOVENOX) injection  30 mg Subcutaneous Daily  . famotidine  20 mg Oral BID  . feeding supplement  1 Container Oral TID BM  . feeding supplement (PRO-STAT SUGAR FREE 64)  30 mL Oral TID  . fluconazole  100 mg Oral Daily  . mouth rinse  15 mL Mouth Rinse BID  . multivitamin with minerals  1 tablet Oral Daily  . pantoprazole  40 mg Oral Daily  . sodium chloride flush  10-40 mL Intracatheter Q12H  . sucralfate  1 g Oral TID WC & HS  . thiamine  100 mg Oral  Daily   Continuous: . TPN CYCLIC-ADULT (ION)      Assessment/Plan: 1) Cervical esophageal web s/p dilation. 2) Candidal esophagitis. 3) Severe malnutrition.   The patient is better with her swallowing.  She feels that food is staying down and she does not report a dysphagia issue.  Plan: 1) Fluconazole 100 mg x 10 days total. 2) Repeat EGD with dilation in approximately 2 weeks.  LOS: 56 days   Araiya Tilmon D 02/06/2020, 2:18 PM

## 2020-02-07 DIAGNOSIS — M069 Rheumatoid arthritis, unspecified: Secondary | ICD-10-CM

## 2020-02-07 MED ORDER — LORATADINE 10 MG PO TABS: 10.0000 mg | ORAL_TABLET | Freq: Every day | ORAL | Status: DC | PRN

## 2020-02-07 MED ORDER — FLUCONAZOLE 100 MG PO TABS: 100.0000 mg | ORAL_TABLET | Freq: Every day | ORAL | 0 refills | Status: DC

## 2020-02-07 MED ORDER — PANTOPRAZOLE SODIUM 40 MG PO TBEC: 40.0000 mg | DELAYED_RELEASE_TABLET | Freq: Every day | ORAL | Status: DC

## 2020-02-07 NOTE — Plan of Care (Signed)
  Problem: Education: Goal: Knowledge of General Education information will improve Description: Including pain rating scale, medication(s)/side effects and non-pharmacologic comfort measures Outcome: Progressing   Problem: Health Behavior/Discharge Planning: Goal: Ability to manage health-related needs will improve Outcome: Progressing   Problem: Clinical Measurements: Goal: Ability to maintain clinical measurements within normal limits will improve Outcome: Progressing Goal: Will remain free from infection Outcome: Progressing Goal: Diagnostic test results will improve Outcome: Progressing Goal: Respiratory complications will improve Outcome: Progressing Goal: Cardiovascular complication will be avoided Outcome: Progressing   Problem: Activity: Goal: Risk for activity intolerance will decrease Outcome: Progressing   Problem: Nutrition: Goal: Adequate nutrition will be maintained Outcome: Progressing   Problem: Coping: Goal: Level of anxiety will decrease Outcome: Progressing   Problem: Elimination: Goal: Will not experience complications related to bowel motility Outcome: Progressing   Problem: Safety: Goal: Ability to remain free from injury will improve Outcome: Progressing   Problem: Skin Integrity: Goal: Risk for impaired skin integrity will decrease Outcome: Progressing

## 2020-02-07 NOTE — TOC Progression Note (Signed)
Transition of Care [facility]) - Progression Note    Patient Details  Name: Deanna Cunningham MRN: [id number] Date of Birth: 1946-05-15  Transition of Care [facility]) CM/SW [place], [place] Phone Number: [phone]  02/07/2020, 9:54 AM  Clinical Narrative:    CSW called [facility] and spoke with [facility] and she informed that patient's authorization is still pending. CSW requested that if authorization comes back to notify CSW.  TOC team will continue to monitor for discharge planning needs.     Expected Discharge Plan: Skilled Nursing Facility([facility]) Barriers to Discharge: Ship broker, Continued Medical Work up  Expected Discharge Plan and Services Expected Discharge Plan: Skilled Nursing Facility([facility])   Discharge Planning Services: CM Consult   Living arrangements for the past 2 months: Single Family Home                                       Social Determinants of Health (SDOH) Interventions    Readmission Risk Interventions No flowsheet data found.

## 2020-02-07 NOTE — Progress Notes (Signed)
Family Medicine Teaching Service Daily Progress Note Intern Pager: [phone]  Patient name: Deanna Cunningham Medical record number: [id number] Date of birth: [birth year] Age: 74 y.o. Gender: female  Primary Care Provider: Kristie Cowman, MD Consultants: TOC,  Code Status: Full Code   Pt Overview and Major Events to Date:  Hospital Day: 57 12/12/2019: admitted for Weakness and Abnormal Lab 12/18/19 PICC line placed  4/19-4/23 macrobid for UTI  5/6 albumin 2.0 5/13 albumin 2.5 5/14 TPN stopped 5/22: Discharge to SNF  Assessment and Plan: Deanna Cunningham is a 74 y.o. female who presented w/ global weakness, weight loss, failure to thrive.  Past medical history significant for malnutrition, rheumatoid arthritis (nave to care), chronic anemia, GERD.   FTT Presbyesophagus, esophageal dysmotility, esophageal candidiasis  GERD Most recent labs on 5/20: alb 2.5, 2, Na 135, K 4.3, gluc 178, Cr 0.59 , phosp 4.2, Mg 1.7, AST 13, ALT 12, ALP 53. Continues prostat tid and boost tid. Restarted TPN on 5/17 MWF 500 calories. EGD on 5/19: Heavy Candidal esophagitis infection and esophageal stricture. GI recommends repeat dilatation in 2 weeks time. Patient has bed at [facility] which offers TPN. She wanted to stay in hospital yesterday, the plan is to discharge her to the SNF today.   -GI following, appreciate recommendations -Continue Diflucan 100mg  for 10 total days until 5/29 -Dysphagia 1 diet  -Continue MWF TPN  -Appreciate dietitian recs -Daily weights -Continue pepcid 20mg  bid, protonix 40mg , sucralfate with meals and at bedtime, thiamine 100mg  daily -Compazine 10mg  Q6PRN -Follow up discharge plan per CSW -Lovenox for DVT ppx  Heel pressure sores Pt noticed pressure sores on heels of feet this morning. Right> left. When uncovering dressing left heel sore bled slightly. -Wound care at SNF  Generalized Weakness  Likely deconditioned in setting of long hospital stay and poor  nutritional status  -continue PT/OT -DME recs ordered  Bifascicular block Moderate MR/TR, stable Last EKG 12/27/2019, QTc 479 -Avoid QT prolonging medications  Anemia, stable Hb 8.6 on 5/17. Stable  Required transfusions during this admission. - Continue CBC Mondays and Thursdays  Rheumatoid arthritis, chronic -Follows with rheumatology outpatient  FEN/GI:  . Fluids: N.S 24ml/hour  . Electrolytes: Replete PRN  . Nutrition: Dysphagia 1, TPN  Access: PICC, placed 12/19/2019 VTE prophylaxis: Lovenox on hold, restart EGD  Disposition: SNF as soon as bed is available   Subjective:  No acute events overnight.  Objective: Temp:  [97.8 F (36.6 C)-98.4 F (36.9 C)] 97.8 F (36.6 C) (05/22 0748) Pulse Rate:  [86-103] 95 (05/22 0748) Resp:  [14-20] 20 (05/22 0748) BP: (102-116)/(55-68) 105/62 (05/22 0748) SpO2:  [100 %] 100 % (05/22 0748) Intake/Output      05/21 0701 - 05/22 0700 05/22 0701 - 05/23 0700   P.O. 480 480   I.V. (mL/kg) 767.2 (18.4)    IV Piggyback     Total Intake(mL/kg) 1247.2 (30) 480 (11.5)   Urine (mL/kg/hr) 2400 (2.4) 300 (2.5)   Stool     Total Output 2400 300   Net -1152.9 +180            Physical Exam: General: Alert, frail appearing 73 yr old female Cardio: Normal S1 and S2, RRR.  Pulm: CTAB, normal WOB  Abdomen: Bowel sounds normal. Abdomen soft and non-tender.  Extremities: No peripheral edema. Warm/ well perfused. Pressure sores on heels of feet. Right> left. When uncovering dressing left heel sore bled slightly. Neuro: Cranial nerves grossly intact  CBC: Recent Labs  Lab 02/02/20 0442  WBC 4.6  NEUTROABS 2.6  HGB 8.6*  HCT 28.0*  MCV 100.4*  PLT 227   CMP: Recent Labs  Lab 02/01/20 0445 02/01/20 0445 02/02/20 0442 02/03/20 0432 02/05/20 0534  NA 135   < > 136 137 135  K 3.6   < > 3.7 3.7 4.3  CL 103   < > 106 107 106  CO2 25   < > 25 23 24   GLUCOSE 109*   < > 90 103* 103*  BUN 26*   < > 26* 40* 41*  CREATININE 0.58   < >  0.54 0.52 0.59  CALCIUM 9.4   < > 9.3 9.0 8.9  MG  --   --  1.5* 1.8 1.7  PHOS  --   --  4.5 3.9 4.2  ALBUMIN 2.6*  --  2.5*  --  2.5*   < > = values in this interval not displayed.   CBG: No results for input(s): GLUCAP in the last 168 hours. Micro: Covid Negative  Recent Results (from the past 240 hour(s))  SARS CORONAVIRUS 2 (TAT 6-24 HRS) Nasopharyngeal Nasopharyngeal Swab     Status: None   Collection Time: 02/02/20  9:32 PM   Specimen: Nasopharyngeal Swab  Result Value Ref Range Status   SARS Coronavirus 2 NEGATIVE NEGATIVE Final    Comment: (NOTE) SARS-CoV-2 target nucleic acids are NOT DETECTED. The SARS-CoV-2 RNA is generally detectable in upper and lower respiratory specimens during the acute phase of infection. Negative results do not preclude SARS-CoV-2 infection, do not rule out co-infections with other pathogens, and should not be used as the sole basis for treatment or other patient management decisions. Negative results must be combined with clinical observations, patient history, and epidemiological information. The expected result is Negative. Fact Sheet for Patients: SugarRoll.be Fact Sheet for Healthcare Providers: https://www.woods-mathews.com/ This test is not yet approved or cleared by the [place] FDA and  has been authorized for detection and/or diagnosis of SARS-CoV-2 by FDA under an Emergency Use Authorization (EUA). This EUA will remain  in effect (meaning this test can be used) for the duration of the COVID-19 declaration under Section 56 4(b)(1) of the Act, 21 U.S.C. section 360bbb-3(b)(1), unless the authorization is terminated or revoked sooner. Performed at [facility], [place] [address], [place], [place] [address]   SARS Coronavirus 2 by RT PCR (hospital order, performed in [facility] hospital lab) Nasopharyngeal Nasopharyngeal Swab     Status: None   Collection Time: 02/05/20 10:52 PM    Specimen: Nasopharyngeal Swab  Result Value Ref Range Status   SARS Coronavirus 2 NEGATIVE NEGATIVE Final    Comment: (NOTE) SARS-CoV-2 target nucleic acids are NOT DETECTED. The SARS-CoV-2 RNA is generally detectable in upper and lower respiratory specimens during the acute phase of infection. The lowest concentration of SARS-CoV-2 viral copies this assay can detect is 250 copies / mL. A negative result does not preclude SARS-CoV-2 infection and should not be used as the sole basis for treatment or other patient management decisions.  A negative result may occur with improper specimen collection / handling, submission of specimen other than nasopharyngeal swab, presence of viral mutation(s) within the areas targeted by this assay, and inadequate number of viral copies (<250 copies / mL). A negative result must be combined with clinical observations, patient history, and epidemiological information. Fact Sheet for Patients:   StrictlyIdeas.no Fact Sheet for Healthcare Providers: BankingDealers.co.za This test is not yet approved or  cleared  by the [place] and has been authorized for detection and/or diagnosis of SARS-CoV-2 by FDA under an Emergency Use Authorization (EUA).  This EUA will remain in effect (meaning this test can be used) for the duration of the COVID-19 declaration under Section 564(b)(1) of the Act, 21 U.S.C. section 360bbb-3(b)(1), unless the authorization is terminated or revoked sooner. Performed at [facility], [place] [address] [place], [place] [address]      Imaging/Diagnostic Tests: No results found.  EKG Interpretation  Date/Time:  Friday December 12 2019 13:08:15 EDT Ventricular Rate:  99 PR Interval:  168 QRS Duration: 132 QT Interval:  402 QTC Calculation: 515 R Axis:   -66 Text Interpretation: Sinus rhythm with occasional Premature ventricular complexes and Premature atrial complexes Left  axis deviation Right bundle branch block Anterior infarct , age undetermined Abnormal ECG Porlonged QTc, no STEMI , similar to prior ecg pattern Confirmed by Octaviano Glow [phone]) on 12/12/2019 2:23:01 PM        Procedures:   Procedure Orders     Critical Care     Procedural/ Surgical Case Request: ESOPHAGOGASTRODUODENOSCOPY (EGD) WITH PROPOFOL     UPPER ENDOSCOPY     ED EKG     EKG 12-Lead     EKG 12-Lead     EKG 12-Lead     EKG     EKG 12-Lead     EKG 12-Lead     EKG 12-Lead  [name], MD 02/07/2020, 9:52 AM PGY-1, [place] Intern pager: [phone], text pages welcome

## 2020-02-08 NOTE — Plan of Care (Signed)
  Problem: Education: Goal: Knowledge of General Education information will improve Description: Including pain rating scale, medication(s)/side effects and non-pharmacologic comfort measures Outcome: Progressing   Problem: Health Behavior/Discharge Planning: Goal: Ability to manage health-related needs will improve Outcome: Progressing   Problem: Clinical Measurements: Goal: Ability to maintain clinical measurements within normal limits will improve Outcome: Progressing Goal: Will remain free from infection Outcome: Progressing Goal: Diagnostic test results will improve Outcome: Progressing Goal: Respiratory complications will improve Outcome: Progressing Goal: Cardiovascular complication will be avoided Outcome: Progressing   Problem: Activity: Goal: Risk for activity intolerance will decrease Outcome: Progressing   Problem: Nutrition: Goal: Adequate nutrition will be maintained Outcome: Progressing   Problem: Coping: Goal: Level of anxiety will decrease Outcome: Progressing   Problem: Elimination: Goal: Will not experience complications related to bowel motility Outcome: Progressing   Problem: Safety: Goal: Ability to remain free from injury will improve Outcome: Progressing   Problem: Skin Integrity: Goal: Risk for impaired skin integrity will decrease Outcome: Progressing

## 2020-02-08 NOTE — Progress Notes (Signed)
PHARMACY - TOTAL PARENTERAL NUTRITION CONSULT NOTE  Indication: malnutrition  Patient Measurements: Height: 5\' 4"  (162.6 cm) Weight: 41.6 kg (91 lb 11.4 oz) IBW/kg (Calculated) : 54.7 TPN AdjBW (KG): 42.2 Body mass index is 15.74 kg/m.  Assessment:  56 YOF initially presented on 3/26 for weakness. Patient with PMH of RA, chronic anemia and GERD. She has had ongoing poor intake since Dec 2020 with LE swelling and unintentional weight loss.  Known esophageal dysmotility, presbyesophagus.  Pharmacy consulted to restart TPN  Discussed with patient/brother regarding risk/benefit/indication of TPN and relaying ASPEN guidelines indicate that TPN could be discontinued once patient is meeting > 60% of needs. Risk of overfeeding include increased respiratory workload, hepatic steatosis that could progress to fibrosis or cirrhosis. Brother acknowledges and wishes to continue TPN until albumin is at least 3.0  Glucose / Insulin: CBGs well controlled, SSI dc'd Electrolytes: all WNL  (CoCa high normal at 9.9) Renal: SCr stable, BUN up to 40 LFTs / TGs: LFTs / tbili / TG WNL Prealbumin / albumin: prealbumin increased to WNL at 22.9, albumin 2.5 Intake / Output; MIVF: SCr 0.59, UOP 0.8 ml/kg/hr, LBM 5/18 GI Imaging:  3/31 MBS - unable to pass tablet, esophageal dysmotility, dilation and narrowing  4/1 CT abd pelvis - anasarca and ascites; likely underlying liver disease Surgeries / Procedures:  5/19 EGD with dilation   Central access: PICC 12/18/19 TPN start date: 12/22/2019>>4/10; 4/16>>5/14; 5/17 >>  Nutritional Goals (per RD rec on 5/17): kCal: 1750-1950 per day, Protein: 90-105 g/day, Fluid: > 1.7 L  Each TPN bag to provide 500kCal and 75gm AA per discussion with MD on 5/17 (weekly avg of 32g AA per day).   Current Nutrition:  Dysphagia 1 diet - good appetite per patient Prostat TID - 100 kcal, 15 g protein (each) x 3 yesterday Boost Breeze TID - taking consistently, each supplement provides  250kcal and 9g protein x 3 yesterday  Plan:  NO TPN provided over weekend, providing cyclic TPN on MWF only per MD Noted medically stable for discharge to SNF pending insurance authorization - will monitor closely  Thank you for allowing pharmacy to be a part of this patient's care.  Alycia Rossetti, PharmD, BCPS Clinical Pharmacist Clinical phone for 02/08/2020: [phone] 02/08/2020 2:41 PM   **Pharmacist phone directory can now be found on [email or web address] ([email or web address]).  Listed under [place].

## 2020-02-08 NOTE — TOC Progression Note (Signed)
Transition of Care [facility]) - Progression Note    Patient Details  Name: Deanna Cunningham MRN: [id number] Date of Birth: 1945/12/28  Transition of Care [facility]) CM/SW [place], [place] Phone Number: [phone] 02/08/2020, 9:11 AM  Clinical Narrative:    CSW spoke with facility again and they stated that insurance authorization is still pending. Facility informed CSW that [employer] is not open on the weekends.  TOC team will continue to follow for discharge planning needs.   Expected Discharge Plan: Skilled Nursing Facility([facility]) Barriers to Discharge: Ship broker, Continued Medical Work up  Expected Discharge Plan and Services Expected Discharge Plan: Skilled Nursing Facility([facility])   Discharge Planning Services: CM Consult   Living arrangements for the past 2 months: Single Family Home                                       Social Determinants of Health (SDOH) Interventions    Readmission Risk Interventions No flowsheet data found.

## 2020-02-08 NOTE — Consult Note (Signed)
[place] Nurse Consult Note: Reason for Consult: Bilateral heel pressure injuries. R>L. Patient is alert and oriented, daughter is visiting today.  Both state that wounds were not present on admission. Wound type:Pressure Pressure Injury POA: No Measurement: Right heel with 2.4cm round stable dry eschar Left heel with 0.5 x 1.2cm x 0.1cm pink, moist wound, no exudate. Wound bed:As described above Drainage (amount, consistency, odor) As described above Periwound:intact Dressing procedure/placement/frequency: I have provided bilateral pressure redistribution heel boots for the patient's use both while in house and in the post acute setting. Topical wound care orders are provided for Nursing.  [place] nursing team will follow while in house, and will remain available to this patient, the nursing and medical teams.  Thanks, Maudie Flakes, MSN, RN, [place], Arther Abbott  Pager# [phone]

## 2020-02-08 NOTE — Plan of Care (Signed)
  Problem: Activity: Goal: Risk for activity intolerance will decrease Outcome: Progressing   Problem: Nutrition: Goal: Adequate nutrition will be maintained Outcome: Progressing   Problem: Coping: Goal: Level of anxiety will decrease Outcome: Progressing   Problem: Safety: Goal: Ability to remain free from injury will improve Outcome: Progressing   Problem: Skin Integrity: Goal: Risk for impaired skin integrity will decrease Outcome: Progressing   

## 2020-02-08 NOTE — Progress Notes (Signed)
B/L Heels dressings changed per MD order. Heels floated onto pillows to keep off of bed. Pillow props used to change patient's position. Call light in reach and bed exit alarm maintained.

## 2020-02-08 NOTE — Progress Notes (Signed)
Notified Dr Posey Pronto that patient is having a nose bleed with a medium amount of blood that started about 20 minutes ago and now is a small amount-almost stopped coming from her right nostril. Pt states that this has happened before because her nose is dry in her room. Her VS are stable. Patient is pinching her nose with a cold washcloth, bed is in high fowlers position and ice is applied to nose. RN will continue to monitor and notify Dr Posey Pronto if there is a change in the patient's condition.

## 2020-02-08 NOTE — Progress Notes (Signed)
Family Medicine Teaching Service Daily Progress Note Intern Pager: [phone]  Patient name: Deanna Cunningham Medical record number: [id number] Date of birth: 22-Oct-1945 Age: 74 y.o. Gender: female  Primary Care Provider: Kristie Cowman, MD Consultants: TOC,  Code Status: Full Code   Pt Overview and Major Events to Date:  Hospital Day: 59 12/12/2019: admitted for Weakness and Abnormal Lab 12/18/19 PICC line placed  4/19-4/23 macrobid for UTI  5/6 albumin 2.0 5/13 albumin 2.5 5/14 TPN stopped 5/22: Ready for discharge to SNF  Assessment and Plan: Deanna Cunningham is a 74 y.o. female who presented w/ global weakness, weight loss, failure to thrive.  Past medical history significant for malnutrition, rheumatoid arthritis (nave to care), chronic anemia, GERD.   FTT Presbyesophagus, esophageal dysmotility, esophageal candidiasis  GERD Most recent labs on 5/20: alb 2.5, 2, Na 135, K 4.3, gluc 178, Cr 0.59 , phosp 4.2, Mg 1.7, AST 13, ALT 12, ALP 53. Continues prostat tid and boost tid. Restarted TPN on 5/17 MWF 500 calories. GI recommends repeat dilatation in 2 weeks time. Patient has bed at [facility] which offers TPN. She wanted to stay in hospital yesterday, the plan is to discharge her to the SNF.   -GI following, appreciate recommendations -Continue Diflucan 100mg  for 10 total days until 5/29 -Dysphagia 1 diet  -Continue MWF TPN  -Appreciate dietitian recs -Continue pepcid 20mg  bid, protonix 40mg , sucralfate with meals and at bedtime, thiamine 100mg  daily -Compazine 10mg  Q6PRN  Heel pressure sores Pt noticed pressure sores on heels of feet this morning. Right> left. When uncovering dressing left heel sore bled slightly. -Wound care consult and will need continued care at SNF  Generalized Weakness  Likely deconditioned in setting of long hospital stay and poor nutritional status  -continue PT/OT -DME recs ordered  Bifascicular block Moderate MR/TR, stable Last  EKG 12/27/2019, QTc 479 -Avoid QT prolonging medications  Anemia, stable Hb 8.6 on 5/17. Stable  Required transfusions during this admission. - Continue CBC Mondays and Thursdays  Rheumatoid arthritis, chronic -Follows with rheumatology outpatient  FEN/GI: DYS 1 Access: PICC, placed 12/19/2019 VTE prophylaxis: Lovenox  Disposition: Ready for discharge to SNF  Subjective:  Patient ready to go to SNF.  Objective: Temp:  [97.8 F (36.6 C)-99.1 F (37.3 C)] 99.1 F (37.3 C) (05/22 2028) Pulse Rate:  [95-103] 100 (05/22 2028) Resp:  [16-20] 18 (05/22 2028) BP: (104-117)/(55-62) 117/62 (05/22 2028) SpO2:  [99 %-100 %] 99 % (05/22 2028) Intake/Output      05/22 0701 - 05/23 0700   P.O. 840   Total Intake(mL/kg) 840 (20.2)   Urine (mL/kg/hr) 1000 (1)   Total Output 1000   Net -160           Physical Exam: General: NAD, pleasant Neck: Supple Cardiovascular: RRR, no m/r/g, no LE edema Respiratory: CTA BL, normal work of breathing Gastrointestinal: soft, nontender, nondistended, normoactive BS MSK: moves 4 extremities equally Psych: AOx3, appropriate affect  CBC: Recent Labs  Lab 02/02/20 0442  WBC 4.6  NEUTROABS 2.6  HGB 8.6*  HCT 28.0*  MCV 100.4*  PLT 227   CMP: Recent Labs  Lab 02/01/20 0445 02/01/20 0445 02/02/20 0442 02/03/20 0432 02/05/20 0534  NA 135   < > 136 137 135  K 3.6   < > 3.7 3.7 4.3  CL 103   < > 106 107 106  CO2 25   < > 25 23 24   GLUCOSE 109*   < > 90 103* 103*  BUN 26*   < > 26* 40* 41*  CREATININE 0.58   < > 0.54 0.52 0.59  CALCIUM 9.4   < > 9.3 9.0 8.9  MG  --   --  1.5* 1.8 1.7  PHOS  --   --  4.5 3.9 4.2  ALBUMIN 2.6*  --  2.5*  --  2.5*   < > = values in this interval not displayed.   CBG: No results for input(s): GLUCAP in the last 168 hours. Micro: Covid Negative  Recent Results (from the past 240 hour(s))  SARS CORONAVIRUS 2 (TAT 6-24 HRS) Nasopharyngeal Nasopharyngeal Swab     Status: None   Collection Time:  02/02/20  9:32 PM   Specimen: Nasopharyngeal Swab  Result Value Ref Range Status   SARS Coronavirus 2 NEGATIVE NEGATIVE Final    Comment: (NOTE) SARS-CoV-2 target nucleic acids are NOT DETECTED. The SARS-CoV-2 RNA is generally detectable in upper and lower respiratory specimens during the acute phase of infection. Negative results do not preclude SARS-CoV-2 infection, do not rule out co-infections with other pathogens, and should not be used as the sole basis for treatment or other patient management decisions. Negative results must be combined with clinical observations, patient history, and epidemiological information. The expected result is Negative. Fact Sheet for Patients: SugarRoll.be Fact Sheet for Healthcare Providers: https://www.woods-mathews.com/ This test is not yet approved or cleared by the [place] FDA and  has been authorized for detection and/or diagnosis of SARS-CoV-2 by FDA under an Emergency Use Authorization (EUA). This EUA will remain  in effect (meaning this test can be used) for the duration of the COVID-19 declaration under Section 56 4(b)(1) of the Act, 21 U.S.C. section 360bbb-3(b)(1), unless the authorization is terminated or revoked sooner. Performed at [facility], [place] [address] [place], [place] [address]   SARS Coronavirus 2 by RT PCR (hospital order, performed in [facility] hospital lab) Nasopharyngeal Nasopharyngeal Swab     Status: None   Collection Time: 02/05/20 10:52 PM   Specimen: Nasopharyngeal Swab  Result Value Ref Range Status   SARS Coronavirus 2 NEGATIVE NEGATIVE Final    Comment: (NOTE) SARS-CoV-2 target nucleic acids are NOT DETECTED. The SARS-CoV-2 RNA is generally detectable in upper and lower respiratory specimens during the acute phase of infection. The lowest concentration of SARS-CoV-2 viral copies this assay can detect is 250 copies / mL. A negative result does not  preclude SARS-CoV-2 infection and should not be used as the sole basis for treatment or other patient management decisions.  A negative result may occur with improper specimen collection / handling, submission of specimen other than nasopharyngeal swab, presence of viral mutation(s) within the areas targeted by this assay, and inadequate number of viral copies (<250 copies / mL). A negative result must be combined with clinical observations, patient history, and epidemiological information. Fact Sheet for Patients:   StrictlyIdeas.no Fact Sheet for Healthcare Providers: BankingDealers.co.za This test is not yet approved or cleared  by the [place] FDA and has been authorized for detection and/or diagnosis of SARS-CoV-2 by FDA under an Emergency Use Authorization (EUA).  This EUA will remain in effect (meaning this test can be used) for the duration of the COVID-19 declaration under Section 564(b)(1) of the Act, 21 U.S.C. section 360bbb-3(b)(1), unless the authorization is terminated or revoked sooner. Performed at [facility], [place] [address], [place], [place] [address]      Imaging/Diagnostic Tests: No results found.  [place], [place],  DO 02/08/2020, 12:34 AM PGY-3, [place] Intern pager: [phone], text pages welcome

## 2020-02-09 ENCOUNTER — Other Ambulatory Visit: Payer: Self-pay | Admitting: *Deleted

## 2020-02-09 ENCOUNTER — Telehealth: Payer: Self-pay | Admitting: Oncology

## 2020-02-09 LAB — CBC
HCT: 26 % — ABNORMAL LOW (ref 36.0–46.0)
Hemoglobin: 7.9 g/dL — ABNORMAL LOW (ref 12.0–15.0)
MCH: 30 pg (ref 26.0–34.0)
MCHC: 30.4 g/dL (ref 30.0–36.0)
MCV: 98.9 fL (ref 80.0–100.0)
Platelets: 202 10*3/uL (ref 150–400)
RBC: 2.63 MIL/uL — ABNORMAL LOW (ref 3.87–5.11)
RDW: 14.3 % (ref 11.5–15.5)
WBC: 5.1 10*3/uL (ref 4.0–10.5)
nRBC: 0 % (ref 0.0–0.2)

## 2020-02-09 LAB — COMPREHENSIVE METABOLIC PANEL
ALT: 12 U/L (ref 0–44)
AST: 13 U/L — ABNORMAL LOW (ref 15–41)
Albumin: 2.6 g/dL — ABNORMAL LOW (ref 3.5–5.0)
Alkaline Phosphatase: 56 U/L (ref 38–126)
Anion gap: 10 (ref 5–15)
BUN: 32 mg/dL — ABNORMAL HIGH (ref 8–23)
CO2: 24 mmol/L (ref 22–32)
Calcium: 9.6 mg/dL (ref 8.9–10.3)
Chloride: 104 mmol/L (ref 98–111)
Creatinine, Ser: 0.58 mg/dL (ref 0.44–1.00)
GFR calc Af Amer: >60 mL/min (ref >60)
GFR calc non Af Amer: >60 mL/min (ref >60)
Glucose, Bld: 96 mg/dL (ref 70–99)
Potassium: 2.7 mmol/L — CL (ref 3.5–5.1)
Sodium: 138 mmol/L (ref 135–145)
Total Bilirubin: 0.4 mg/dL (ref 0.3–1.2)
Total Protein: 6.7 g/dL (ref 6.5–8.1)

## 2020-02-09 LAB — BASIC METABOLIC PANEL
Anion gap: 9 (ref 5–15)
BUN: 34 mg/dL — ABNORMAL HIGH (ref 8–23)
CO2: 23 mmol/L (ref 22–32)
Calcium: 9 mg/dL (ref 8.9–10.3)
Chloride: 103 mmol/L (ref 98–111)
Creatinine, Ser: 0.59 mg/dL (ref 0.44–1.00)
GFR calc Af Amer: >60 mL/min (ref >60)
GFR calc non Af Amer: >60 mL/min (ref >60)
Glucose, Bld: 105 mg/dL — ABNORMAL HIGH (ref 70–99)
Potassium: 3.5 mmol/L (ref 3.5–5.1)
Sodium: 135 mmol/L (ref 135–145)

## 2020-02-09 LAB — DIFFERENTIAL
Abs Immature Granulocytes: 0.03 10*3/uL (ref 0.00–0.07)
Basophils Absolute: 0 10*3/uL (ref 0.0–0.1)
Basophils Relative: 0 %
Eosinophils Absolute: 0.2 10*3/uL (ref 0.0–0.5)
Eosinophils Relative: 4 %
Immature Granulocytes: 1 %
Lymphocytes Relative: 22 %
Lymphs Abs: 1.1 10*3/uL (ref 0.7–4.0)
Monocytes Absolute: 0.7 10*3/uL (ref 0.1–1.0)
Monocytes Relative: 13 %
Neutro Abs: 3.1 10*3/uL (ref 1.7–7.7)
Neutrophils Relative %: 60 %

## 2020-02-09 LAB — PREALBUMIN: Prealbumin: 21 mg/dL (ref 18–38)

## 2020-02-09 LAB — TRIGLYCERIDES: Triglycerides: 73 mg/dL (ref <150)

## 2020-02-09 LAB — MAGNESIUM: Magnesium: 1.6 mg/dL — ABNORMAL LOW (ref 1.7–2.4)

## 2020-02-09 LAB — PHOSPHORUS: Phosphorus: 3.9 mg/dL (ref 2.5–4.6)

## 2020-02-09 MED ORDER — POTASSIUM CHLORIDE 20 MEQ PO PACK
40.0000 meq | PACK | Freq: Once | ORAL | Status: AC
  Administered 2020-02-09: 40 meq via ORAL
  Filled 2020-02-09: qty 2

## 2020-02-09 MED ORDER — POTASSIUM CHLORIDE 10 MEQ/50ML IV SOLN
10.0000 meq | INTRAVENOUS | Status: AC
  Administered 2020-02-09 (×2): 10 meq via INTRAVENOUS
  Filled 2020-02-09 (×4): qty 50

## 2020-02-09 MED ORDER — POTASSIUM CHLORIDE CRYS ER 20 MEQ PO TBCR: 40.0000 meq | EXTENDED_RELEASE_TABLET | Freq: Once | ORAL | Status: DC

## 2020-02-09 MED ORDER — POTASSIUM CHLORIDE 10 MEQ/50ML IV SOLN
10.0000 meq | INTRAVENOUS | Status: DC
  Filled 2020-02-09 (×2): qty 50

## 2020-02-09 MED ORDER — TRAVASOL 10 % IV SOLN
INTRAVENOUS | Status: AC
  Filled 2020-02-09: qty 747.24

## 2020-02-09 MED ORDER — MAGNESIUM SULFATE 2 GM/50ML IV SOLN
2.0000 g | Freq: Once | INTRAVENOUS | Status: AC
  Administered 2020-02-09: 2 g via INTRAVENOUS
  Filled 2020-02-09: qty 50

## 2020-02-09 NOTE — Progress Notes (Addendum)
Family Medicine Teaching Service Daily Progress Note Intern Pager: [phone]  Patient name: Deanna Cunningham Medical record number: [id number] Date of birth: 10-09-1945 Age: 74 y.o. Gender: female  Primary Care Provider: Kristie Cowman, MD Consultants: TOC,  Code Status: Full Code   Pt Overview and Major Events to Date:  Hospital Day: 60 12/12/2019: admitted for Weakness and Abnormal Lab 12/18/19 PICC line placed  4/19-4/23 macrobid for UTI  5/6 albumin 2.0 5/13 albumin 2.5 5/14 TPN stopped 5/22: Ready for discharge to SNF  Assessment and Plan: Deanna Cunningham is a 74 y.o. female who presented w/ global weakness, weight loss, failure to thrive.  Past medical history significant for malnutrition, rheumatoid arthritis (nave to care), chronic anemia, GERD.   FTT Presbyesophagus, esophageal dysmotility, esophageal candidiasis  GERD Hypokalemia Most recent labs on 5/24: Albumin 2.6, Na 138, K 2.7, Cl 104, CO2 24, cr 0.58, phosphorus 3.9, Mg 1.6. K repletion with 35mEq oral x1, 53mEq x4 IV, 1700 BMP. Unclear cause of K loss, repeat BMP pending from this morning to confirm. Patient having no vomiting, diarrhea, or other causes of K loss. Continue prostat tid and boost tid. Restarted TPN on 5/17 MWF 500 calories. GI recommends repeat dilation in 1 week's time. Patient has bed at [facility] which offers [facility], will take her tomorrow. -GI following, appreciate recommendations -Continue Diflucan 100mg  for 14 total days until 6/2 -1700 BMP -Dysphagia 1 diet  -Continue MWF TPN  -Appreciate dietitian recs -Continue pepcid 20mg  bid, protonix 40mg , sucralfate with meals and at bedtime, thiamine 100mg  daily -Compazine 10mg  Q6PRN  Heel pressure sores Originally noticed by patient this weekend. Right> left. R heel with dry eschar, see pictures below. -Wound care consult and will need continued care at SNF -Outpatient wound care follow up  Generalized Weakness  Likely deconditioned  in setting of long hospital stay and poor nutritional status  -continue PT/OT -DME recs ordered  Bifascicular block Moderate MR/TR, stable Last EKG 12/27/2019, QTc 479 -Avoid QT prolonging medications  Anemia, stable Hb 7.9 on 5/24. Stable  Required transfusions during this admission. - Continue CBC Mondays and Thursdays  Rheumatoid arthritis, chronic -Follows with rheumatology outpatient  FEN/GI: DYS 1 Access: PICC, placed 12/19/2019 VTE prophylaxis: Lovenox  Disposition: Ready for discharge to SNF  Subjective:  Patient ready to go to SNF.  Objective: Temp:  [97.8 F (36.6 C)-98.4 F (36.9 C)] 97.8 F (36.6 C) (05/24 0256) Pulse Rate:  [90-95] 95 (05/24 0256) Resp:  [14-16] 16 (05/24 0256) BP: (104-112)/(56-67) 106/56 (05/24 0256) SpO2:  [97 %-100 %] 100 % (05/24 0256) Intake/Output      05/23 0701 - 05/24 0700 05/24 0701 - 05/25 0700   P.O. 780    I.V. (mL/kg)     Total Intake(mL/kg) 780 (18.8)    Urine (mL/kg/hr) 2400 (2.4)    Stool 0    Total Output 2400    Net -1620         Urine Occurrence 1 x    Stool Occurrence 1 x        Physical Exam: General: NAD, pleasant Neck: Supple Cardiovascular: RRR, no m/r/g, no LE edema Respiratory: CTA BL, normal work of breathing Gastrointestinal: soft, nontender, nondistended, normoactive BS MSK: moves 4 extremities equally Psych: AOx3, appropriate affect Derm: pressure ulcer L heel; dry eschar of R heel      CBC: Recent Labs  Lab 02/09/20 0400  WBC 5.1  NEUTROABS 3.1  HGB 7.9*  HCT 26.0*  MCV 98.9  PLT 202   CMP: Recent Labs  Lab 02/03/20 0432 02/05/20 0534  NA 137 135  K 3.7 4.3  CL 107 106  CO2 23 24  GLUCOSE 103* 103*  BUN 40* 41*  CREATININE 0.52 0.59  CALCIUM 9.0 8.9  MG 1.8 1.7  PHOS 3.9 4.2  ALBUMIN  --  2.5*   CBG: No results for input(s): GLUCAP in the last 168 hours. Micro: Covid Negative  Recent Results (from the past 240 hour(s))  SARS CORONAVIRUS 2 (TAT 6-24 HRS)  Nasopharyngeal Nasopharyngeal Swab     Status: None   Collection Time: 02/02/20  9:32 PM   Specimen: Nasopharyngeal Swab  Result Value Ref Range Status   SARS Coronavirus 2 NEGATIVE NEGATIVE Final    Comment: (NOTE) SARS-CoV-2 target nucleic acids are NOT DETECTED. The SARS-CoV-2 RNA is generally detectable in upper and lower respiratory specimens during the acute phase of infection. Negative results do not preclude SARS-CoV-2 infection, do not rule out co-infections with other pathogens, and should not be used as the sole basis for treatment or other patient management decisions. Negative results must be combined with clinical observations, patient history, and epidemiological information. The expected result is Negative. Fact Sheet for Patients: SugarRoll.be Fact Sheet for Healthcare Providers: https://www.woods-mathews.com/ This test is not yet approved or cleared by the [place] FDA and  has been authorized for detection and/or diagnosis of SARS-CoV-2 by FDA under an Emergency Use Authorization (EUA). This EUA will remain  in effect (meaning this test can be used) for the duration of the COVID-19 declaration under Section 56 4(b)(1) of the Act, 21 U.S.C. section 360bbb-3(b)(1), unless the authorization is terminated or revoked sooner. Performed at [facility], [place] [address], [place], [place] [address]   SARS Coronavirus 2 by RT PCR (hospital order, performed in [facility] hospital lab) Nasopharyngeal Nasopharyngeal Swab     Status: None   Collection Time: 02/05/20 10:52 PM   Specimen: Nasopharyngeal Swab  Result Value Ref Range Status   SARS Coronavirus 2 NEGATIVE NEGATIVE Final    Comment: (NOTE) SARS-CoV-2 target nucleic acids are NOT DETECTED. The SARS-CoV-2 RNA is generally detectable in upper and lower respiratory specimens during the acute phase of infection. The lowest concentration of SARS-CoV-2 viral copies this  assay can detect is 250 copies / mL. A negative result does not preclude SARS-CoV-2 infection and should not be used as the sole basis for treatment or other patient management decisions.  A negative result may occur with improper specimen collection / handling, submission of specimen other than nasopharyngeal swab, presence of viral mutation(s) within the areas targeted by this assay, and inadequate number of viral copies (<250 copies / mL). A negative result must be combined with clinical observations, patient history, and epidemiological information. Fact Sheet for Patients:   StrictlyIdeas.no Fact Sheet for Healthcare Providers: BankingDealers.co.za This test is not yet approved or cleared  by the [place] FDA and has been authorized for detection and/or diagnosis of SARS-CoV-2 by FDA under an Emergency Use Authorization (EUA).  This EUA will remain in effect (meaning this test can be used) for the duration of the COVID-19 declaration under Section 564(b)(1) of the Act, 21 U.S.C. section 360bbb-3(b)(1), unless the authorization is terminated or revoked sooner. Performed at [facility], [place] [address], [place], [place] [address]      Imaging/Diagnostic Tests: No results found.  Gladys Damme, MD 02/09/2020, 7:35 AM PGY-1, [place] Intern pager: [phone], text  pages welcome

## 2020-02-09 NOTE — Progress Notes (Addendum)
Occupational Therapy Treatment Patient Details Name: Deanna Cunningham MRN: [id number] DOB: September 22, 1945 Today's Date: 02/09/2020    History of present illness Pt is a 74 y.o. female admitted 12/12/19 with progressive weakness, poor PO intake and BLE swelling; worked up for malnutrition (concern for malignancy). Admitted with severe protein calorie malnutrition In the setting of known esophageal dysmotility. Pt also with anemia, electrolyte imbalance, stage 1 sacral wound. Pt with recurrent asymptomatic SVT; may occur in the settings of severe malnutrition and electrolyte imbalance. PMH includes dysphagia, CKD III, anemia, RA.   OT comments  Pt limited this session secondary to orthostatic BP (see below). RN notified. Pt required increased time and effort to progress to EOB. Pt with flat affect during session, she reports feeling "groggy" this date and reports feeling very tired. Per CM, pt with anticipated d/c to SNF tomorrow. Educated pt on importance of advocating for herself to continue progression toward baseline and maximizing independence and safety with ADL/IADL and functional mobility. Pt will continue to benefit from skilled OT services to maximize safety and independence with ADL/IADL and functional mobility. Will continue to follow acutely and progress as tolerated.   Orthostatic Blood Pressure: RN notified and MD notified  Supine: 93/56 HR 68bpm  Sitting: 79/45 (55) HR 100bpm  Sitting 3 min: 83/52 (62) HR 102bpm  Standing: 84/44 (55) HR 103bpm  *pt reporting increased thirst, pt diaphoretic and pallor, pt requesting to return to supine  Return to supine: 91/55 (92) HR 67 bpm     Follow Up Recommendations  Home health OT;Supervision/Assistance - 24 hour    Equipment Recommendations  3 in 1 bedside commode;Tub/shower bench    Recommendations for Other Services      Precautions / Restrictions Precautions Precautions: Fall;Other (comment) Precaution Comments: Bilateral  hand/finger RA, sacral wound, rectal prolapse (occurred 4/16) Restrictions Weight Bearing Restrictions: No       Mobility Bed Mobility Overal bed mobility: Modified Independent Bed Mobility: Supine to Sit;Sit to Supine Rolling: Modified independent (Device/Increase time)   Supine to sit: Modified independent (Device/Increase time)     General bed mobility comments: increased time and effort  Transfers Overall transfer level: Needs assistance Equipment used: Rolling walker (2 wheeled) Transfers: Sit to/from Stand Sit to Stand: Min guard         General transfer comment: minguard for safety;pt with increased dizziness with initial standing    Balance Overall balance assessment: Needs assistance Sitting-balance support: Feet supported;No upper extremity supported Sitting balance-Leahy Scale: Good     Standing balance support: Single extremity supported;No upper extremity supported;During functional activity Standing balance-Leahy Scale: Fair Standing balance comment: static stand without UE support;limited standing tolerance this session due to orthostatic BP                           ADL either performed or assessed with clinical judgement   ADL Overall ADL's : Needs assistance/impaired Eating/Feeding: Sitting;Modified independent Eating/Feeding Details (indicate cue type and reason): pt eating breakfast and opening containers with increased time and effort                                   General ADL Comments: session limited to bed level and EOB due to orthostatic hypotension, RN aware     Vision       Perception     Praxis      Cognition Arousal/Alertness: Awake/alert  Behavior During Therapy: Flat affect Overall Cognitive Status: Within Functional Limits for tasks assessed                                 General Comments: pt flat affect throughout today's session, she was not as energetic and upbeat compared to  previous sessions, pt reports feeling "groggy" and tired.         Exercises Total Joint Exercises Towel Squeeze: AROM;Both;10 reps;Seated General Exercises - Lower Extremity Long Arc Quad: Both;10 reps;Seated;Strengthening Hip Flexion/Marching: AROM;Both;10 reps;Seated Toe Raises: AROM;Both;Seated Heel Raises: AROM;Both;20 reps;Seated Mini-Sqauts: AROM;Both;10 reps;Standing(via chair push ups.)   Shoulder Instructions       General Comments pt with c/o R foot pain with ambulation    Pertinent Vitals/ Pain       Pain Assessment: No/denies pain Faces Pain Scale: Hurts little more Pain Location: R Foot>L foot Pain Descriptors / Indicators: Discomfort Pain Intervention(s): Monitored during session  Home Living                                          Prior Functioning/Environment              Frequency  Min 2X/week        Progress Toward Goals  OT Goals(current goals can now be found in the care plan section)  Progress towards OT goals: Not progressing toward goals - comment(limited by BP)  Acute Rehab OT Goals Patient Stated Goal: to go to SNF prior to d/c home OT Goal Formulation: With patient Time For Goal Achievement: 02/11/20 Potential to Achieve Goals: Good ADL Goals Pt Will Perform Grooming: with modified independence;standing Pt Will Perform Upper Body Bathing: with modified independence;standing;sitting Pt Will Perform Lower Body Bathing: with modified independence;sit to/from stand Pt Will Perform Upper Body Dressing: with modified independence;sitting Pt Will Perform Lower Body Dressing: with modified independence;sit to/from stand Pt Will Transfer to Toilet: with modified independence;ambulating;regular height toilet Pt Will Perform Toileting - Clothing Manipulation and hygiene: with modified independence;sit to/from stand Pt/caregiver will Perform Home Exercise Program: Increased strength;Right Upper extremity;Left upper  extremity;With Supervision;With written HEP provided  Plan Discharge plan remains appropriate    Co-evaluation                 AM-PAC OT "6 Clicks" Daily Activity     Outcome Measure   Help from another person eating meals?: None Help from another person taking care of personal grooming?: None Help from another person toileting, which includes using toliet, bedpan, or urinal?: A Little Help from another person bathing (including washing, rinsing, drying)?: A Little Help from another person to put on and taking off regular upper body clothing?: None Help from another person to put on and taking off regular lower body clothing?: A Little 6 Click Score: 21    End of Session Equipment Utilized During Treatment: Rolling walker  OT Visit Diagnosis: Unsteadiness on feet (R26.81);Repeated falls (R29.6);Muscle weakness (generalized) (M62.81) Pain - Right/Left: Right   Activity Tolerance Treatment limited secondary to medical complications (Comment)(orthostatic BP)   Patient Left in bed;with call bell/phone within reach;with bed alarm set   Nurse Communication Mobility status;Other (comment)(BP)        Time: [id number] OT Time Calculation (min): 24 min  Charges: OT General Charges $OT Visit: 1 Visit OT Treatments $Self Care/Home Management :  23-37 mins  Helene Kelp OTR/L Acute Rehabilitation Services Office: [place] 02/09/2020, 1:47 PM

## 2020-02-09 NOTE — Progress Notes (Signed)
Per Dr. Benay Spice: Needs lab/OV on 6/10 or 6/11. Scheduling message sent.

## 2020-02-09 NOTE — Progress Notes (Signed)
PHARMACY - TOTAL PARENTERAL NUTRITION CONSULT NOTE  Indication: malnutrition  Patient Measurements: Height: 5\' 4"  (162.6 cm) Weight: 41.6 kg (91 lb 11.4 oz) IBW/kg (Calculated) : 54.7 TPN AdjBW (KG): 42.2 Body mass index is 15.74 kg/m.  Assessment:  89 YOF initially presented on 3/26 for weakness. Patient with PMH of RA, chronic anemia and GERD. She has had ongoing poor intake since Dec 2020 with LE swelling and unintentional weight loss.  Known esophageal dysmotility, presbyesophagus.  Pharmacy consulted to restart TPN.  Discussed with patient/brother regarding risk/benefit/indication of TPN and relaying ASPEN guidelines indicate that TPN could be discontinued once patient is meeting > 60% of needs. Risk of overfeeding include increased respiratory workload, hepatic steatosis that could progress to fibrosis or cirrhosis. Brother acknowledges and wishes to continue TPN until albumin is at least 3.0.  Glucose / Insulin: CBGs well controlled, SSI dc'd Electrolytes: K down to 2.7, Mag 1.6, corrected Ca slightly high at 10.5, others WNL Renal: SCr wnl stable, BUN down to 32 LFTs / TGs: LFTs / Tbili / TG WNL Prealbumin / albumin: prealbumin 21, albumin 2.6 Intake / Output; MIVF: UOP 2.4 ml/kg/hr, LBM 5/23 GI Imaging:  3/31 MBS - unable to pass tablet, esophageal dysmotility, dilation and narrowing  4/1 CT abd pelvis - anasarca and ascites; likely underlying liver disease Surgeries / Procedures:  5/19 EGD with dilation   Central access: PICC 12/18/19 TPN start date: 12/22/2019>>4/10; 4/16>>5/14; 5/17 >>  Nutritional Goals (per RD rec on 5/17): kCal: 1750-1950 per day, Protein: 90-105 g/day, Fluid: > 1.7 L  Each TPN bag to provide 500kCal and 75gm AA per discussion with MD on 5/17 (weekly avg of 32g AA per day).   Current Nutrition:  Dysphasia 1 diet - good appetite, no issues Prostat TID - 100 kcal, 15 g protein (each) Boost Breeze TID - taking consistently, each supplement provides  250kcal and 9g protein   Plan:  Providing cyclic TPN on MWF only per MD Noted medically stable for discharge to SNF pending insurance authorization - will monitor closely  Cycle 974ml over 12 hrs: 44 mL/hr x 1 hr, 87 mL/hr x 10 hr, then 44 mL/hr x 1 hr  Oral supplements and dysphagia diet meet 100% of kCal and 91% of protein needs TPN and oral intake meets > 100% of patient needs Lytes in TPN: continue previously increased Na and reduced phos, increase K back to standard 50 meq/L, increase Mag, reduce Ca to 34meq/L; Cl:Ac 1:1 Continue daily PO multivitamin (no multivitamin and trace elements in TPN) Monitor weight, albumin and re-assess TPN provision as needed  Give mag sulfate 2g IV x 1 K replacement to be entered by FM team per discussion - K runs x 4 + KCl 50meq PO x 1    Arturo Morton, PharmD, BCPS Please check AMION for all [place] contact numbers Clinical Pharmacist 02/09/2020 7:35 AM

## 2020-02-09 NOTE — Progress Notes (Signed)
Physical Therapy Treatment Patient Details Name: Deanna Cunningham MRN: [id number] DOB: 10/17/1945 Today's Date: 02/09/2020    History of Present Illness Pt is a 74 y.o. female admitted 12/12/19 with progressive weakness, poor PO intake and BLE swelling; worked up for malnutrition (concern for malignancy). Admitted with severe protein calorie malnutrition In the setting of known esophageal dysmotility. Pt also with anemia, electrolyte imbalance, stage 1 sacral wound. Pt with recurrent asymptomatic SVT; may occur in the settings of severe malnutrition and electrolyte imbalance. PMH includes dysphagia, CKD III, anemia, RA.    PT Comments    Pt supine in bed, eager to mobilize.  Pt reports new sores on heels which causes pain and required use of RW for further gt distances.  She reports feeling flushed.  BP 95/55,  Assisted patient back to bed and reassessed BP and it improved to 118/63.  Pt continues to benefit from rehab in a post acute setting to improve strength and function before returning home.      Follow Up Recommendations  SNF;Supervision/Assistance - 24 hour     Equipment Recommendations  3in1 (PT)    Recommendations for Other Services       Precautions / Restrictions Precautions Precautions: Fall;Other (comment) Precaution Comments: Bilateral hand/finger RA, sacral wound, rectal prolapse (occurred 4/16) Restrictions Weight Bearing Restrictions: No    Mobility  Bed Mobility Overal bed mobility: Modified Independent             General bed mobility comments: no physical assist needed; use of bed features and increased time  Transfers Overall transfer level: Needs assistance Equipment used: Rolling walker (2 wheeled) Transfers: Sit to/from Stand Sit to Stand: Supervision         General transfer comment: supervision for safety sit<>stand from EOB and recliner  Ambulation/Gait Ambulation/Gait assistance: Min assist Gait Distance (Feet): 8 Feet Assistive  device: None Gait Pattern/deviations: Step-through pattern;Decreased stride length;Narrow base of support;Decreased dorsiflexion - left;Staggering right;Staggering left;Decreased dorsiflexion - right;Trunk flexed Gait velocity: decreased   General Gait Details: Pt attempted gt without device and presents with pain in R foot and decreased balance required min assistance to correct.  Pt performed additional trials with RW 100 ft x 2 + 50 ft.  Pt reports feeling hot and flushed noted drop in pm post gt informed RN.  Cues for hip extension and increasing BOS   Stairs             Wheelchair Mobility    Modified Rankin (Stroke Patients Only)       Balance Overall balance assessment: Needs assistance   Sitting balance-Leahy Scale: Good       Standing balance-Leahy Scale: Fair                              Cognition Arousal/Alertness: Awake/alert Behavior During Therapy: WFL for tasks assessed/performed Overall Cognitive Status: Within Functional Limits for tasks assessed                                        Exercises Total Joint Exercises Towel Squeeze: AROM;Both;10 reps;Seated General Exercises - Lower Extremity Long Arc Quad: Both;10 reps;Seated;Strengthening Hip Flexion/Marching: AROM;Both;10 reps;Seated Toe Raises: AROM;Both;Seated Heel Raises: AROM;Both;20 reps;Seated Mini-Sqauts: AROM;Both;10 reps;Standing(via chair push ups.)    General Comments        Pertinent Vitals/Pain Pain Assessment: Faces Faces Pain Scale: Hurts  little more Pain Location: R Foot>L foot Pain Descriptors / Indicators: Discomfort Pain Intervention(s): Monitored during session;Limited activity within patient's tolerance;Repositioned    Home Living                      Prior Function            PT Goals (current goals can now be found in the care plan section) Acute Rehab PT Goals Patient Stated Goal: pt wanting to go home now Potential to  Achieve Goals: Fair Progress towards PT goals: Progressing toward goals    Frequency    Min 2X/week      PT Plan Current plan remains appropriate    Co-evaluation              AM-PAC PT "6 Clicks" Mobility   Outcome Measure  Help needed turning from your back to your side while in a flat bed without using bedrails?: None Help needed moving from lying on your back to sitting on the side of a flat bed without using bedrails?: None Help needed moving to and from a bed to a chair (including a wheelchair)?: A Little Help needed standing up from a chair using your arms (e.g., wheelchair or bedside chair)?: A Little Help needed to walk in hospital room?: A Little Help needed climbing 3-5 steps with a railing? : A Little 6 Click Score: 20    End of Session Equipment Utilized During Treatment: Gait belt Activity Tolerance: Patient tolerated treatment well;Patient limited by fatigue Patient left: with call bell/phone within reach;in chair;with chair alarm set Nurse Communication: Mobility status PT Visit Diagnosis: Other abnormalities of gait and mobility (R26.89);Muscle weakness (generalized) (M62.81);Difficulty in walking, not elsewhere classified (R26.2) Pain - Right/Left: (bilateral) Pain - part of body: Leg;Ankle and joints of foot     Time: [id number] PT Time Calculation (min) (ACUTE ONLY): 38 min  Charges:  $Gait Training: 8-22 mins $Therapeutic Exercise: 8-22 mins $Therapeutic Activity: 8-22 mins                     Erasmo Leventhal , PTA Acute Rehabilitation Services Pager [phone] Office [phone]     Deanna Cunningham 02/09/2020, 1:11 PM

## 2020-02-09 NOTE — Telephone Encounter (Signed)
Scheduled appt per 5/24 sch message - left message with appt date and time

## 2020-02-09 NOTE — Progress Notes (Signed)
OT Cancellation Note  Patient Details Name: Deanna Cunningham MRN: [id number] DOB: 1945/12/20   Cancelled Treatment:    Reason Eval/Treat Not Completed: Patient declined, no reason specified Pt declined at this time due to eating breakfast. Attempted to see pt 2x this morning about 64minutes apart, pt eating breakfast both attempts. She requested OT return later. OT will return as time allows and pt is appropriate.   [facility] OTR/L Acute Rehabilitation Services Office: [place] 02/09/2020, 12:16 PM

## 2020-02-09 NOTE — Progress Notes (Signed)
Resident family MD has been notified of pt's orthostatic hypotension on both occasions  While on PT/OT therapy, No new orders receive at this time.Pt remains in no acute distress. No complaints voiced.

## 2020-02-09 NOTE — TOC Progression Note (Signed)
Transition of Care [facility]) - Progression Note    Patient Details  Name: Deanna Cunningham MRN: [id number] Date of Birth: November 16, 1945  Transition of Care [facility] CM/SW Contact  Sharin Mons, RN Phone Number: [phone] 02/09/2020, 9:29 AM  Clinical Narrative:    Insurance authorization still pending ..... Updated therapy notes requested by insurance this. NCM made PT/OT aware.  TOC team will continue to monitor.   Expected Discharge Plan: Skilled Nursing Facility Barriers to Discharge: Insurance Authorization  Expected Discharge Plan and Services Expected Discharge Plan: [place]   Discharge Planning Services: CM Consult   Living arrangements for the past 2 months: Single Family Home                                       Social Determinants of Health (SDOH) Interventions    Readmission Risk Interventions No flowsheet data found.

## 2020-02-09 NOTE — Plan of Care (Signed)
  Problem: Education: Goal: Knowledge of General Education information will improve Description: Including pain rating scale, medication(s)/side effects and non-pharmacologic comfort measures Outcome: Progressing   Problem: Health Behavior/Discharge Planning: Goal: Ability to manage health-related needs will improve Outcome: Progressing   Problem: Clinical Measurements: Goal: Ability to maintain clinical measurements within normal limits will improve Outcome: Progressing   Problem: Elimination: Goal: Will not experience complications related to bowel motility Outcome: Progressing

## 2020-02-09 NOTE — TOC Progression Note (Addendum)
Transition of Care [facility]) - Progression Note    Patient Details  Name: Deanna Cunningham MRN: [id number] Date of Birth: 04/26/1946  Transition of Care [facility]) CM/SW Contact  Sharin Mons, RN Phone Number: [phone] 02/09/2020, 1:27 PM  Clinical Narrative:    Insurance authorization received for SNF/ [facility] per  admission liaison, Mickel Baas.Mickel Baas stated facility can receive pt tomorrow am, 5/25. Updated COVID needed for disposition to SNF.  Pt, nurse  and MD made aware.    Expected Discharge Plan: Skilled Nursing Facility Barriers to Discharge: Insurance Authorization  Expected Discharge Plan and Services Expected Discharge Plan: [place]   Discharge Planning Services: CM Consult   Living arrangements for the past 2 months: Single Family Home                                       Social Determinants of Health (SDOH) Interventions    Readmission Risk Interventions No flowsheet data found.

## 2020-02-09 NOTE — Progress Notes (Signed)
Received a critical lab value approximated at  0825 of K=2.7, Dr. Chauncey Reading very much aware( see new orders)

## 2020-02-10 LAB — MAGNESIUM: Magnesium: 2.1 mg/dL (ref 1.7–2.4)

## 2020-02-10 LAB — COMPREHENSIVE METABOLIC PANEL
ALT: 11 U/L (ref 0–44)
AST: 14 U/L — ABNORMAL LOW (ref 15–41)
Albumin: 2.6 g/dL — ABNORMAL LOW (ref 3.5–5.0)
Alkaline Phosphatase: 61 U/L (ref 38–126)
Anion gap: 5 (ref 5–15)
BUN: 47 mg/dL — ABNORMAL HIGH (ref 8–23)
CO2: 23 mmol/L (ref 22–32)
Calcium: 9.1 mg/dL (ref 8.9–10.3)
Chloride: 107 mmol/L (ref 98–111)
Creatinine, Ser: 0.52 mg/dL (ref 0.44–1.00)
GFR calc Af Amer: >60 mL/min (ref >60)
GFR calc non Af Amer: >60 mL/min (ref >60)
Glucose, Bld: 105 mg/dL — ABNORMAL HIGH (ref 70–99)
Potassium: 4.2 mmol/L (ref 3.5–5.1)
Sodium: 135 mmol/L (ref 135–145)
Total Bilirubin: 0.3 mg/dL (ref 0.3–1.2)
Total Protein: 6.7 g/dL (ref 6.5–8.1)

## 2020-02-10 LAB — PHOSPHORUS: Phosphorus: 3.1 mg/dL (ref 2.5–4.6)

## 2020-02-10 LAB — SARS CORONAVIRUS 2 (TAT 6-24 HRS): SARS Coronavirus 2: NEGATIVE

## 2020-02-10 MED ORDER — HEPARIN SOD (PORK) LOCK FLUSH 100 UNIT/ML IV SOLN
250.0000 [IU] | INTRAVENOUS | Status: AC | PRN
  Administered 2020-02-10: 250 [IU]
  Filled 2020-02-10: qty 2.5

## 2020-02-10 MED ORDER — CLOTRIMAZOLE 1 % EX CREA
TOPICAL_CREAM | Freq: Two times a day (BID) | CUTANEOUS | Status: DC
  Filled 2020-02-10: qty 15

## 2020-02-10 MED ORDER — PANTOPRAZOLE SODIUM 40 MG PO TBEC: 40.0000 mg | DELAYED_RELEASE_TABLET | Freq: Every day | ORAL | Status: AC

## 2020-02-10 MED ORDER — CLOTRIMAZOLE 1 % EX CREA: TOPICAL_CREAM | Freq: Two times a day (BID) | CUTANEOUS | 0 refills | Status: DC

## 2020-02-10 MED ORDER — FLUCONAZOLE 100 MG PO TABS: 100.0000 mg | ORAL_TABLET | Freq: Every day | ORAL | 0 refills | Status: AC

## 2020-02-10 NOTE — Progress Notes (Signed)
Nutrition Follow-up  RD working remotely.  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  Ongoing  GOAL:   Patient will meet greater than or equal to 90% of their needs  Met via TPN, meals, and supplements  MONITOR:   PO intake, Supplement acceptance, Diet advancement, Labs, Weight trends, Skin, I & O's  REASON FOR ASSESSMENT:   Consult Assessment of nutrition requirement/status  ASSESSMENT:   Deanna Cunningham is a 74 y.o. female presenting with weakness. PMH is significant for Rheumatoid Arthritis (no tx, no rheumatologist), chronic anemia and GERD.  4/2- PICC placed 4/5- TPN initiated 4/10- TPN d/c 4/17- TPN restarted due to nausea, vomiting, poor oral intake 4/20- transitioned to cyclic TPN 2/50- TPN d/c 5/19- s/p EGD- revealed benign-appearing esophageal stenosis (dilated); esophageal plaques were found, consistent with candidiasis; 5 cm hiatal hernia.  Reviewed I/O's: -2.3 L x 24 hours and -23.2 L since 01/27/20  UOP: 3.4 L x 24 hours  TPN re-started on 01/31/20. Plan to provide cyclic TPN on Monday, Wednesday, and Friday only;Cycle 913m over 12 hrs: 44 mL/hr x 1 hr, 87 mL/hr x 10 hr, then 44 mL/hr x 1 hr. Regimen to provide 512 kcals and 75 grams protein, meeting 29% of estimated kcal needs and 83% of estimated protein needs.Per pharmacy note, pt will not receive TPN today.  Reviewed  meal completion records, supplement intake, and TPN over the past 7 days. On average, pt is intaking 1831 kcals and 125 grams protein, meeting 100% of estimated kcal and protein needs via TPN, meals, and supplements. Noted pt is taking in some foods of high nutritional quality, such as mashed potatoes and pureed meats, in addition to juices, broths, soups, and applesauce.   Per TOC team notes, possible d/c to SNF today.   Labs reviewed: K, Mg, and Phos WDL.   Diet Order:   Diet Order            DIET - DYS 1 Room service appropriate? Yes; Fluid consistency: Thin  Diet effective now              EDUCATION NEEDS:   Education needs have been addressed  Skin:  Skin Assessment: Skin Integrity Issues: Skin Integrity Issues:: Stage II Stage II: coccyx- epithelialized Other: MASD to groin, labia, buttocks, and sacrum  Last BM:  02/08/20  Height:   Ht Readings from Last 1 Encounters:  01/17/20 5' 4"  (1.626 m)    Weight:   Wt Readings from Last 1 Encounters:  02/02/20 41.6 kg    Ideal Body Weight:  54.5 kg  BMI:  Body mass index is 15.74 kg/m.  Estimated Nutritional Needs:   Kcal:  [address] Protein:  90-105 grams  Fluid:  > 1.7 L    JLoistine Chance RD, LDN, [place]istered Dietitian II Certified Diabetes Care and Education Specialist Please refer to [facility] Hospitalfor RD and/or RD on-call/weekend/after hours pager

## 2020-02-10 NOTE — Progress Notes (Signed)
Patient discharged by PTAR to [facility], going to room 142B.  All personal belongings gathered and taken with patient.  Daughter was notified by case manaement that she would be transferred today.  Report given to [place], RN @ [employer].  Discussed wound care, diet and medications.  PICC was deaccessed and flushed by IV Team.

## 2020-02-10 NOTE — Progress Notes (Signed)
Family Medicine Teaching Service Daily Progress Note Intern Pager: [phone]  Patient name: [name] Medical record number: [id number] Date of birth: October 06, 1945 Age: 74 y.o. Gender: female  Primary Care Provider: Kristie Cowman, MD Consultants: TOC,  Code Status: Full Code   Pt Overview and Major Events to Date:  Hospital Day: 61 12/12/2019: admitted for Weakness and Abnormal Lab 12/18/19 PICC line placed  4/19-4/23 macrobid for UTI  5/6 albumin 2.0 5/13 albumin 2.5 5/14 TPN stopped 5/22: Ready for discharge to SNF  Assessment and Plan: Deanna Cunningham is a 74 y.o. female who presented w/ global weakness, weight loss, failure to thrive.  Past medical history significant for malnutrition, rheumatoid arthritis (nave to care), chronic anemia, GERD.   FTT Presbyesophagus, esophageal dysmotility, esophageal candidiasis  GERD Patient doing well this morning. Restarted TPN on 5/17 MWF 500 calories. GI recommends repeat dilation in 1 week's time. Patient has bed at [facility] which offers TPN, will take her tomorrow. -GI following, appreciate recommendations -Continue Diflucan 100mg  for 14 total days until 6/2 -1700 BMP -Dysphagia 1 diet  -Continue MWF TPN  -Appreciate dietitian recs -Continue pepcid 20mg  bid, protonix 40mg , sucralfate with meals and at bedtime, thiamine 100mg  daily -Compazine 10mg  Q6PRN  Hypokalemia- resolved K stable at 4.2 this morning. Most likely 2.7 lab yesterday was spurious, as patient does not have any known source of K loss and has been stable for weeks. Recommend a BMP in 1 week to ensure K stays WNL.  - Recheck K on BMP in 1 week  Heel pressure sores Originally noticed by patient this weekend. Right> left. R heel with dry eschar, see pictures below. -Wound care consult and will need continued care at SNF -Outpatient wound care follow up  Generalized Weakness  Likely deconditioned in setting of long hospital stay and poor  nutritional status  -continue PT/OT -DME recs ordered  Bifascicular block Moderate MR/TR, stable Last EKG 12/27/2019, QTc 479 -Avoid QT prolonging medications  Anemia, stable Hb 7.9 on 5/24. Stable  Required transfusions during this admission. - Continue regular outpatient monitoring  Rheumatoid arthritis, chronic -Follows with rheumatology outpatient  FEN/GI: DYS 1 Access: PICC, placed 12/19/2019 VTE prophylaxis: Lovenox  Disposition: Ready for discharge to SNF  Subjective:  Patient ready to go to SNF.  Objective: Temp:  [98.1 F (36.7 C)-98.6 F (37 C)] 98.6 F (37 C) (05/25 0355) Pulse Rate:  [89-106] 106 (05/25 0355) Resp:  [14-15] 14 (05/25 0355) BP: (100-116)/(56-63) 116/61 (05/25 0355) SpO2:  [99 %-100 %] 100 % (05/25 0355) Intake/Output      05/24 0701 - 05/25 0700 05/25 0701 - 05/26 0700   P.O. 980    I.V. (mL/kg) 0 (0)    IV Piggyback 100    Total Intake(mL/kg) 1080 (26)    Urine (mL/kg/hr) 3400 (3.4)    Stool     Total Output 3400    Net -2320         Urine Occurrence 2 x        Physical Exam: General: NAD, pleasant Neck: Supple Cardiovascular: RRR, no m/r/g, no LE edema Respiratory: CTA BL, normal work of breathing Gastrointestinal: soft, nontender, nondistended, normoactive BS MSK: moves 4 extremities equally Psych: AOx3, appropriate affect Derm: pressure ulcer L heel; dry eschar of R heel  CBC: Recent Labs  Lab 02/09/20 0400  WBC 5.1  NEUTROABS 3.1  HGB 7.9*  HCT 26.0*  MCV 98.9  PLT 202   CMP: Recent Labs  Lab 02/05/20  [id number] 02/05/20 0534 02/09/20 0400 02/09/20 1856 02/10/20 0450  NA 135   < > 138 135 135  K 4.3   < > 2.7* 3.5 4.2  CL 106   < > 104 103 107  CO2 24   < > 24 23 23   GLUCOSE 103*   < > 96 105* 105*  BUN 41*   < > 32* 34* 47*  CREATININE 0.59   < > 0.58 0.59 0.52  CALCIUM 8.9   < > 9.6 9.0 9.1  MG 1.7  --  1.6*  --  2.1  PHOS 4.2  --  3.9  --  3.1  ALBUMIN 2.5*  --  2.6*  --  2.6*   < > = values in this  interval not displayed.   CBG: No results for input(s): GLUCAP in the last 168 hours. Micro: Covid Negative  Recent Results (from the past 240 hour(s))  SARS CORONAVIRUS 2 (TAT 6-24 HRS) Nasopharyngeal Nasopharyngeal Swab     Status: None   Collection Time: 02/02/20  9:32 PM   Specimen: Nasopharyngeal Swab  Result Value Ref Range Status   SARS Coronavirus 2 NEGATIVE NEGATIVE Final    Comment: (NOTE) SARS-CoV-2 target nucleic acids are NOT DETECTED. The SARS-CoV-2 RNA is generally detectable in upper and lower respiratory specimens during the acute phase of infection. Negative results do not preclude SARS-CoV-2 infection, do not rule out co-infections with other pathogens, and should not be used as the sole basis for treatment or other patient management decisions. Negative results must be combined with clinical observations, patient history, and epidemiological information. The expected result is Negative. Fact Sheet for Patients: SugarRoll.be Fact Sheet for Healthcare Providers: https://www.woods-mathews.com/ This test is not yet approved or cleared by the [place] FDA and  has been authorized for detection and/or diagnosis of SARS-CoV-2 by FDA under an Emergency Use Authorization (EUA). This EUA will remain  in effect (meaning this test can be used) for the duration of the COVID-19 declaration under Section 56 4(b)(1) of the Act, 21 U.S.C. section 360bbb-3(b)(1), unless the authorization is terminated or revoked sooner. Performed at [facility], [place] [address] [place],  [address]   SARS Coronavirus 2 by RT PCR (hospital order, performed in [facility] hospital lab) Nasopharyngeal Nasopharyngeal Swab     Status: None   Collection Time: 02/05/20 10:52 PM   Specimen: Nasopharyngeal Swab  Result Value Ref Range Status   SARS Coronavirus 2 NEGATIVE NEGATIVE Final    Comment: (NOTE) SARS-CoV-2 target nucleic acids are  NOT DETECTED. The SARS-CoV-2 RNA is generally detectable in upper and lower respiratory specimens during the acute phase of infection. The lowest concentration of SARS-CoV-2 viral copies this assay can detect is 250 copies / mL. A negative result does not preclude SARS-CoV-2 infection and should not be used as the sole basis for treatment or other patient management decisions.  A negative result may occur with improper specimen collection / handling, submission of specimen other than nasopharyngeal swab, presence of viral mutation(s) within the areas targeted by this assay, and inadequate number of viral copies (<250 copies / mL). A negative result must be combined with clinical observations, patient history, and epidemiological information. Fact Sheet for Patients:   StrictlyIdeas.no Fact Sheet for Healthcare Providers: BankingDealers.co.za This test is not yet approved or cleared  by the [place] FDA and has been authorized for detection and/or diagnosis of SARS-CoV-2 by FDA under an Emergency Use Authorization (EUA).  This EUA will  remain in effect (meaning this test can be used) for the duration of the COVID-19 declaration under Section 564(b)(1) of the Act, 21 U.S.C. section 360bbb-3(b)(1), unless the authorization is terminated or revoked sooner. Performed at [facility], [place] [address], [place], [place] [address]   SARS CORONAVIRUS 2 (TAT 6-24 HRS) Nasopharyngeal Nasopharyngeal Swab     Status: None   Collection Time: 02/09/20  2:07 PM   Specimen: Nasopharyngeal Swab  Result Value Ref Range Status   SARS Coronavirus 2 NEGATIVE NEGATIVE Final    Comment: (NOTE) SARS-CoV-2 target nucleic acids are NOT DETECTED. The SARS-CoV-2 RNA is generally detectable in upper and lower respiratory specimens during the acute phase of infection. Negative results do not preclude SARS-CoV-2 infection, do not rule out co-infections with  other pathogens, and should not be used as the sole basis for treatment or other patient management decisions. Negative results must be combined with clinical observations, patient history, and epidemiological information. The expected result is Negative. Fact Sheet for Patients: SugarRoll.be Fact Sheet for Healthcare Providers: https://www.woods-mathews.com/ This test is not yet approved or cleared by the [place] FDA and  has been authorized for detection and/or diagnosis of SARS-CoV-2 by FDA under an Emergency Use Authorization (EUA). This EUA will remain  in effect (meaning this test can be used) for the duration of the COVID-19 declaration under Section 56 4(b)(1) of the Act, 21 U.S.C. section 360bbb-3(b)(1), unless the authorization is terminated or revoked sooner. Performed at [facility], [place] [address] [place], [place] [address]      Imaging/Diagnostic Tests: No results found.  Gladys Damme, MD 02/10/2020, 7:34 AM PGY-1, [place] Intern pager: [phone], text pages welcome

## 2020-02-10 NOTE — Progress Notes (Signed)
Occupational Therapy Treatment Patient Details Name: Deanna Cunningham MRN: [id number] DOB: 02-06-1946 Today's Date: 02/10/2020    History of present illness Pt is a 74 y.o. female admitted 12/12/19 with progressive weakness, poor PO intake and BLE swelling; worked up for malnutrition (concern for malignancy). Admitted with severe protein calorie malnutrition In the setting of known esophageal dysmotility. Pt also with anemia, electrolyte imbalance, stage 1 sacral wound. Pt with recurrent asymptomatic SVT; may occur in the settings of severe malnutrition and electrolyte imbalance. PMH includes dysphagia, CKD III, anemia, RA.   OT comments  Pt reports feeling better this date. Provided pt with items to complete grooming, UB bathing, and LB bathing while seated at sink level. Pt limited in standing tolerance this session due to pain in right heel. Pt required minguard for functional mobility at RW level. Pt will continue to benefit from skilled OT services to maximize safety and independence with ADL/IADL and functional mobility. Will continue to follow acutely and progress as tolerated.    Follow Up Recommendations  Supervision/Assistance - 24 hour;SNF    Equipment Recommendations  3 in 1 bedside commode;Tub/shower bench    Recommendations for Other Services      Precautions / Restrictions Precautions Precautions: Fall;Other (comment) Precaution Comments: Bilateral hand/finger RA, sacral wound, rectal prolapse (occurred 4/16) Restrictions Weight Bearing Restrictions: No       Mobility Bed Mobility Overal bed mobility: Modified Independent                Transfers Overall transfer level: Needs assistance Equipment used: Rolling walker (2 wheeled) Transfers: Sit to/from Stand Sit to Stand: Min guard         General transfer comment: minguard for safety    Balance Overall balance assessment: Needs assistance Sitting-balance support: Feet supported;No upper extremity  supported Sitting balance-Leahy Scale: Good Sitting balance - Comments: Can reach bilateral feet to fix socks    Standing balance support: Single extremity supported;No upper extremity supported;During functional activity Standing balance-Leahy Scale: Fair                             ADL either performed or assessed with clinical judgement   ADL Overall ADL's : Needs assistance/impaired Eating/Feeding: Sitting;Modified independent Eating/Feeding Details (indicate cue type and reason): pt finishing breakfast upon arrival Grooming: Set up;Sitting Grooming Details (indicate cue type and reason): pt limited in standing this session due to pain in right foot Upper Body Bathing: Set up;Sitting Upper Body Bathing Details (indicate cue type and reason): at sink level Lower Body Bathing: Set up;Sit to/from stand Lower Body Bathing Details (indicate cue type and reason): at sink level          Toilet Transfer: Min Marine scientist Details (indicate cue type and reason): into bathroom;         Functional mobility during ADLs: Min guard;Rolling walker General ADL Comments: pt limited this session due to pain in right heel     Vision       Perception     Praxis      Cognition Arousal/Alertness: Awake/alert Behavior During Therapy: Flat affect Overall Cognitive Status: Within Functional Limits for tasks assessed                                          Exercises     Shoulder Instructions  General Comments BP 124/71    Pertinent Vitals/ Pain       Pain Assessment: Faces Faces Pain Scale: Hurts little more Pain Location: R Foot>L foot Pain Descriptors / Indicators: Discomfort Pain Intervention(s): Monitored during session;Limited activity within patient's tolerance  Home Living                                          Prior Functioning/Environment              Frequency  Min 2X/week         Progress Toward Goals  OT Goals(current goals can now be found in the care plan section)  Progress towards OT goals: Progressing toward goals  Acute Rehab OT Goals Patient Stated Goal: to go to SNF prior to d/c home OT Goal Formulation: With patient Time For Goal Achievement: 02/11/20 Potential to Achieve Goals: Good ADL Goals Pt Will Perform Grooming: with modified independence;standing Pt Will Perform Upper Body Bathing: with modified independence;standing;sitting Pt Will Perform Lower Body Bathing: with modified independence;sit to/from stand Pt Will Perform Upper Body Dressing: with modified independence;sitting Pt Will Perform Lower Body Dressing: with modified independence;sit to/from stand Pt Will Transfer to Toilet: with modified independence;ambulating;regular height toilet Pt Will Perform Toileting - Clothing Manipulation and hygiene: with modified independence;sit to/from stand Pt/caregiver will Perform Home Exercise Program: Increased strength;Right Upper extremity;Left upper extremity;With Supervision;With written HEP provided  Plan Discharge plan remains appropriate    Co-evaluation                 AM-PAC OT "6 Clicks" Daily Activity     Outcome Measure   Help from another person eating meals?: None Help from another person taking care of personal grooming?: None Help from another person toileting, which includes using toliet, bedpan, or urinal?: A Little Help from another person bathing (including washing, rinsing, drying)?: A Little Help from another person to put on and taking off regular upper body clothing?: None Help from another person to put on and taking off regular lower body clothing?: A Little 6 Click Score: 21    End of Session Equipment Utilized During Treatment: Rolling walker  OT Visit Diagnosis: Unsteadiness on feet (R26.81);Repeated falls (R29.6);Muscle weakness (generalized) (M62.81) Pain - Right/Left: Right Pain - part of  body: Ankle and joints of foot   Activity Tolerance Patient tolerated treatment well   Patient Left in chair;with call bell/phone within reach;with chair alarm set   Nurse Communication Mobility status;Other (comment)(BP)        Time: [id number] OT Time Calculation (min): 46 min  Charges: OT General Charges $OT Visit: 1 Visit OT Treatments $Self Care/Home Management : 38-52 mins  [facility] OTR/L Acute Rehabilitation Services Office: [place] 02/10/2020, 12:28 PM

## 2020-02-10 NOTE — Plan of Care (Signed)
  Problem: Education: Goal: Knowledge of General Education information will improve Description: Including pain rating scale, medication(s)/side effects and non-pharmacologic comfort measures Outcome: Progressing   Problem: Health Behavior/Discharge Planning: Goal: Ability to manage health-related needs will improve Outcome: Progressing   Problem: Clinical Measurements: Goal: Ability to maintain clinical measurements within normal limits will improve Outcome: Progressing Goal: Will remain free from infection Outcome: Progressing Goal: Diagnostic test results will improve Outcome: Progressing Goal: Respiratory complications will improve Outcome: Progressing Goal: Cardiovascular complication will be avoided Outcome: Progressing   Problem: Activity: Goal: Risk for activity intolerance will decrease Outcome: Progressing   Problem: Nutrition: Goal: Adequate nutrition will be maintained Outcome: Progressing   Problem: Coping: Goal: Level of anxiety will decrease Outcome: Progressing   Problem: Elimination: Goal: Will not experience complications related to bowel motility Outcome: Progressing   Problem: Safety: Goal: Ability to remain free from injury will improve Outcome: Progressing   Problem: Skin Integrity: Goal: Risk for impaired skin integrity will decrease Outcome: Progressing

## 2020-02-10 NOTE — Plan of Care (Signed)
  Problem: Education: Goal: Knowledge of General Education information will improve Description: Including pain rating scale, medication(s)/side effects and non-pharmacologic comfort measures 02/10/2020 1817 by Baldomero Lamy, RN Outcome: Adequate for Discharge 02/10/2020 [phone] by Baldomero Lamy, RN Outcome: Progressing   Problem: Health Behavior/Discharge Planning: Goal: Ability to manage health-related needs will improve 02/10/2020 1817 by Baldomero Lamy, RN Outcome: Adequate for Discharge 02/10/2020 [id number] by Baldomero Lamy, RN Outcome: Progressing   Problem: Clinical Measurements: Goal: Ability to maintain clinical measurements within normal limits will improve 02/10/2020 1817 by Baldomero Lamy, RN Outcome: Adequate for Discharge 02/10/2020 [id number] by Baldomero Lamy, RN Outcome: Progressing Goal: Will remain free from infection 02/10/2020 1817 by Baldomero Lamy, RN Outcome: Adequate for Discharge 02/10/2020 [phone] by Baldomero Lamy, RN Outcome: Progressing Goal: Diagnostic test results will improve 02/10/2020 1817 by Baldomero Lamy, RN Outcome: Adequate for Discharge 02/10/2020 [id number] by Baldomero Lamy, RN Outcome: Progressing Goal: Respiratory complications will improve 02/10/2020 1817 by Baldomero Lamy, RN Outcome: Adequate for Discharge 02/10/2020 [id number] by Baldomero Lamy, RN Outcome: Progressing Goal: Cardiovascular complication will be avoided 02/10/2020 1817 by Baldomero Lamy, RN Outcome: Adequate for Discharge 02/10/2020 [phone] by Baldomero Lamy, RN Outcome: Progressing   Problem: Activity: Goal: Risk for activity intolerance will decrease 02/10/2020 1817 by Baldomero Lamy, RN Outcome: Adequate for Discharge 02/10/2020 [phone] by Baldomero Lamy, RN Outcome: Progressing   Problem: Nutrition: Goal: Adequate nutrition will be maintained 02/10/2020 1817 by Baldomero Lamy, RN Outcome: Adequate for Discharge 02/10/2020 [phone] by Baldomero Lamy, RN Outcome: Progressing   Problem: Coping: Goal: Level of anxiety  will decrease 02/10/2020 1817 by Baldomero Lamy, RN Outcome: Adequate for Discharge 02/10/2020 [phone] by Baldomero Lamy, RN Outcome: Progressing   Problem: Elimination: Goal: Will not experience complications related to bowel motility 02/10/2020 1817 by Baldomero Lamy, RN Outcome: Adequate for Discharge 02/10/2020 [id number] by Baldomero Lamy, RN Outcome: Progressing   Problem: Safety: Goal: Ability to remain free from injury will improve 02/10/2020 1817 by Baldomero Lamy, RN Outcome: Adequate for Discharge 02/10/2020 [id number] by Baldomero Lamy, RN Outcome: Progressing   Problem: Skin Integrity: Goal: Risk for impaired skin integrity will decrease 02/10/2020 1817 by Baldomero Lamy, RN Outcome: Adequate for Discharge 02/10/2020 [phone] by Baldomero Lamy, RN Outcome: Progressing   Problem: Acute Rehab OT Goals (only OT should resolve) Goal: Pt. Will Perform Grooming Outcome: Adequate for Discharge Goal: Pt. Will Perform Upper Body Bathing Outcome: Adequate for Discharge Goal: Pt. Will Perform Lower Body Bathing Outcome: Adequate for Discharge Goal: Pt. Will Perform Upper Body Dressing Outcome: Adequate for Discharge Goal: Pt. Will Perform Lower Body Dressing Outcome: Adequate for Discharge Goal: Pt. Will Transfer To Toilet Outcome: Adequate for Discharge Goal: Pt. Will Perform Toileting-Clothing Manipulation Outcome: Adequate for Discharge Goal: Pt/Caregiver Will Perform Home Exercise Program Outcome: Adequate for Discharge   Problem: Malnutrition  (NI-5.2) Goal: Food and/or nutrient delivery Description: Individualized approach for food/nutrient provision. Outcome: Adequate for Discharge

## 2020-02-10 NOTE — Plan of Care (Signed)

## 2020-02-10 NOTE — Progress Notes (Signed)
PHARMACY - TOTAL PARENTERAL NUTRITION CONSULT NOTE  Indication: malnutrition  Patient Measurements: Height: 5\' 4"  (162.6 cm) Weight: 41.6 kg (91 lb 11.4 oz) IBW/kg (Calculated) : 54.7 TPN AdjBW (KG): 42.2 Body mass index is 15.74 kg/m.  Assessment:  68 YOF initially presented on 3/26 for weakness. Patient with PMH of RA, chronic anemia and GERD. She has had ongoing poor intake since Dec 2020 with LE swelling and unintentional weight loss.  Known esophageal dysmotility, presbyesophagus.  Pharmacy consulted to restart TPN.  Discussed with patient/brother regarding risk/benefit/indication of TPN and relaying ASPEN guidelines indicate that TPN could be discontinued once patient is meeting > 60% of needs. Risk of overfeeding include increased respiratory workload, hepatic steatosis that could progress to fibrosis or cirrhosis. Brother acknowledges and wishes to continue TPN until albumin is at least 3.0.  Glucose / Insulin: CBGs well controlled, SSI dc'd Electrolytes: K up to 4.2 (s/p 2 K runs + total Kcl PO 32meq yesterday), Mag up to 2.1 (s/p Mag sulfate 2g IV x 1), others WNL Renal: SCr wnl stable, BUN up to 47 LFTs / TGs: LFTs / Tbili / TG WNL Prealbumin / albumin: prealbumin 21, albumin 2.6 Intake / Output; MIVF: UOP 3.4 ml/kg/hr, LBM 5/23 GI Imaging:  3/31 MBS - unable to pass tablet, esophageal dysmotility, dilation and narrowing  4/1 CT abd pelvis - anasarca and ascites; likely underlying liver disease Surgeries / Procedures:  5/19 EGD with dilation   Central access: PICC 12/18/19 TPN start date: 12/22/2019>>4/10; 4/16>>5/14; 5/17 >>  Nutritional Goals (per RD rec on 5/17): kCal: 1750-1950 per day, Protein: 90-105 g/day, Fluid: > 1.7 L  Each TPN bag to provide 500kCal and 75gm AA per discussion with MD on 5/17 (weekly avg of 32g AA per day).   Current Nutrition:  Dysphasia 1 diet - good appetite, no issues Prostat TID - 100 kcal, 15 g protein (each) Boost Breeze TID - taking  consistently, each supplement provides 250kcal and 9g protein   Plan:  Providing cyclic TPN on MWF only per MD - no orders for today Noted medically stable for discharge to SNF pending insurance authorization - will monitor closely. Planning discharge today  Continue daily PO multivitamin (no multivitamin and trace elements in TPN) Monitor weight, albumin and re-assess TPN provision as needed   Arturo Morton, PharmD, BCPS Please check AMION for all [place] contact numbers Clinical Pharmacist 02/10/2020 7:40 AM

## 2020-02-10 NOTE — TOC Transition Note (Signed)
Transition of Care [facility]) - CM/SW Discharge Note   Patient Details  Name: AUBRIEL TOZIER MRN: [id number] Date of Birth: 1946/01/22  Transition of [facility]) CM/SW Contact:  Sharin Mons, RN Phone Number: 02/10/2020, 2:28 PM   Clinical Narrative:     Patient will DC to: [facility] Anticipated DC date: 02/10/2020 Family notified: [name] Transport by: Corey Harold   Per MD patient ready for DC today . RN, patient, patient's family, and facility notified of DC. Discharge Summary and FL2 sent to facility. RN to call report prior to discharge [phone]).  Rm # [phone] B. DC packet on chart. Ambulance transport requested for patient.   RNCM will sign off for now as intervention is no longer needed. Please consult [place] again if new needs arise.   Raynald Kemp (Daughter)       [phone]        Final next level of care: [place] Barriers to Discharge: No Barriers Identified   Patient Goals and CMS Choice Patient states their goals for this hospitalization and ongoing recovery are:: to feel better CMS Medicare.gov Compare Post Acute Care list provided to:: Patient Choice offered to / list presented to : Patient  Discharge Placement                       Discharge Plan and Services   Discharge Planning Services: CM Consult                                 Social Determinants of Health (SDOH) Interventions     Readmission Risk Interventions No flowsheet data found.

## 2020-02-10 NOTE — Progress Notes (Addendum)
FPTS Interim Progress Note  S: Pt reported noticing that her L breast is > R breast and concern for rash under L breast. She noticed it only around lunch time. Patient does have history of LN bx in L axilla. Patient is R hand dominant.  O: BP (!) 98/50 (BP Location: Left Arm)   Pulse 90   Temp 97.9 F (36.6 C)   Resp 14   Ht 5\' 4"  (1.626 m)   Wt 41.6 kg   SpO2 99%   BMI 15.74 kg/m   Chest: L>R, no skin changes, normal breast tissue.  Derm: flaking, mildly erythematous patch ~3 cm under L breast  A/P: Breast: Normal breast exam, one breast is larger than the other, consistent with hand dominance. Patient's last mammogram in 05/2018 was WNL, with recommendation to repeat in 1 year. Patient is due for mammogram, at age cut off. Recommend she get her regular mammogram to give her peace of mind as it is due for last check.   Rash: Most likely tinea versicolor due to mild rash in intertriginous area. Treat with clotrimazole cr BID x 2 weeks.  Gladys Damme, MD 02/10/2020, 3:06 PM PGY-1, [facility] pager [phone]

## 2020-02-11 ENCOUNTER — Telehealth: Payer: Self-pay | Admitting: Family Medicine

## 2020-02-11 ENCOUNTER — Encounter: Payer: Self-pay | Admitting: Family Medicine

## 2020-02-11 NOTE — Telephone Encounter (Signed)
Received message that patient's TPN orders needed to be clarified.  Asked to call Heywood Iles, NP at work number.  She had left the office in was able to get her on her cell phone.  She may call back with any further questions.   Wilber Oliphant, M.D.  4:52 PM 02/11/2020

## 2020-02-17 ENCOUNTER — Other Ambulatory Visit: Payer: Self-pay | Admitting: Gastroenterology

## 2020-02-20 ENCOUNTER — Telehealth: Payer: Self-pay | Admitting: *Deleted

## 2020-02-20 NOTE — Telephone Encounter (Signed)
Called to cancel her appointments for 02/26/20 since she is having a procedure w/Dr. Benson Norway. She will call back to reschedule.

## 2020-02-24 ENCOUNTER — Other Ambulatory Visit (HOSPITAL_COMMUNITY)
Admission: RE | Admit: 2020-02-24 | Discharge: 2020-02-24 | Disposition: A | Discharge status: Home / Self Care | Payer: BC Managed Care – PPO | Source: Ambulatory Visit | Attending: Gastroenterology | Admitting: Gastroenterology

## 2020-02-24 DIAGNOSIS — Z01812 Encounter for preprocedural laboratory examination: Secondary | ICD-10-CM | POA: Insufficient documentation

## 2020-02-24 DIAGNOSIS — Z20822 Contact with and (suspected) exposure to covid-19: Secondary | ICD-10-CM | POA: Diagnosis not present

## 2020-02-24 LAB — SARS CORONAVIRUS 2 (TAT 6-24 HRS): SARS Coronavirus 2: NEGATIVE

## 2020-02-26 ENCOUNTER — Ambulatory Visit: Payer: BC Managed Care – PPO | Admitting: Nurse Practitioner

## 2020-02-26 ENCOUNTER — Other Ambulatory Visit: Payer: BC Managed Care – PPO

## 2020-02-27 ENCOUNTER — Ambulatory Visit (HOSPITAL_COMMUNITY): Payer: BC Managed Care – PPO | Admitting: Anesthesiology

## 2020-02-27 ENCOUNTER — Ambulatory Visit (HOSPITAL_COMMUNITY)
Admission: RE | Admit: 2020-02-27 | Discharge: 2020-02-27 | Disposition: A | Discharge status: Skilled Nursing Facility | Payer: BC Managed Care – PPO | Source: Ambulatory Visit | Attending: Gastroenterology | Admitting: Gastroenterology

## 2020-02-27 ENCOUNTER — Other Ambulatory Visit: Payer: Self-pay

## 2020-02-27 ENCOUNTER — Encounter (HOSPITAL_COMMUNITY): Payer: Self-pay | Admitting: Gastroenterology

## 2020-02-27 ENCOUNTER — Encounter (HOSPITAL_COMMUNITY)
Admission: RE | Disposition: A | Discharge status: Skilled Nursing Facility | Payer: Self-pay | Source: Ambulatory Visit | Attending: Gastroenterology

## 2020-02-27 DIAGNOSIS — K449 Diaphragmatic hernia without obstruction or gangrene: Secondary | ICD-10-CM | POA: Insufficient documentation

## 2020-02-27 DIAGNOSIS — N2581 Secondary hyperparathyroidism of renal origin: Secondary | ICD-10-CM | POA: Insufficient documentation

## 2020-02-27 DIAGNOSIS — D631 Anemia in chronic kidney disease: Secondary | ICD-10-CM | POA: Diagnosis not present

## 2020-02-27 DIAGNOSIS — Q394 Esophageal web: Secondary | ICD-10-CM | POA: Insufficient documentation

## 2020-02-27 DIAGNOSIS — N183 Chronic kidney disease, stage 3 unspecified: Secondary | ICD-10-CM | POA: Insufficient documentation

## 2020-02-27 DIAGNOSIS — K219 Gastro-esophageal reflux disease without esophagitis: Secondary | ICD-10-CM | POA: Insufficient documentation

## 2020-02-27 HISTORY — PX: SAVORY DILATION: SHX5439

## 2020-02-27 HISTORY — PX: ESOPHAGOGASTRODUODENOSCOPY (EGD) WITH PROPOFOL: SHX5813

## 2020-02-27 SURGERY — ESOPHAGOGASTRODUODENOSCOPY (EGD) WITH PROPOFOL
Anesthesia: Monitor Anesthesia Care

## 2020-02-27 MED ORDER — LACTATED RINGERS IV SOLN
INTRAVENOUS | Status: AC | PRN
  Administered 2020-02-27: 1000 mL via INTRAVENOUS

## 2020-02-27 MED ORDER — PROPOFOL 500 MG/50ML IV EMUL
INTRAVENOUS | Status: DC | PRN
  Administered 2020-02-27: 100 ug/kg/min via INTRAVENOUS

## 2020-02-27 MED ORDER — PROPOFOL 10 MG/ML IV BOLUS
INTRAVENOUS | Status: DC | PRN
  Administered 2020-02-27 (×3): 20 mg via INTRAVENOUS

## 2020-02-27 MED ORDER — SODIUM CHLORIDE 0.9 % IV SOLN: INTRAVENOUS | Status: DC

## 2020-02-27 MED ORDER — LIDOCAINE 2% (20 MG/ML) 5 ML SYRINGE
INTRAMUSCULAR | Status: DC | PRN
  Administered 2020-02-27: 20 mg via INTRAVENOUS

## 2020-02-27 SURGICAL SUPPLY — 15 items

## 2020-02-27 NOTE — Anesthesia Postprocedure Evaluation (Signed)
Anesthesia Post Note  Patient: Deanna Cunningham  Procedure(s) Performed: ESOPHAGOGASTRODUODENOSCOPY (EGD) WITH PROPOFOL (N/A ) SAVORY DILATION (N/A )     Patient location during evaluation: Endoscopy Anesthesia Type: MAC Level of consciousness: awake and alert Pain management: pain level controlled Vital Signs Assessment: post-procedure vital signs reviewed and stable Respiratory status: spontaneous breathing, nonlabored ventilation, respiratory function stable and patient connected to nasal cannula oxygen Cardiovascular status: blood pressure returned to baseline and stable Postop Assessment: no apparent nausea or vomiting Anesthetic complications: no   No complications documented.  Last Vitals:  Vitals:   02/27/20 1250 02/27/20 1300  BP: 119/64 130/60  Resp: 19 18  Temp:    SpO2: 100% 99%    Last Pain:  Vitals:   02/27/20 1300  TempSrc:   PainSc: 0-No pain                 Sahmya Arai L Tasheena [place]

## 2020-02-27 NOTE — Anesthesia Preprocedure Evaluation (Addendum)
Anesthesia Evaluation  Patient identified by MRN, date of birth, ID band Patient awake    Reviewed: Allergy & Precautions, NPO status , Patient's Chart, lab work & pertinent test results  Airway Mallampati: II  TM Distance: >3 FB Neck ROM: Full    Dental  (+) Edentulous Upper, Edentulous Lower   Pulmonary neg pulmonary ROS,    Pulmonary exam normal breath sounds clear to auscultation       Cardiovascular Normal cardiovascular exam+ Valvular Problems/Murmurs MR  Rhythm:Regular Rate:Normal  TTE 2021 1. Left ventricular ejection fraction, by estimation, is 65 to 70%. The left ventricle has normal function. The left ventricle has no regional wall motion abnormalities. Left ventricular diastolic parameters are indeterminate.  2. Right ventricular systolic function is normal. The right ventricular size is normal. There is mildly elevated pulmonary artery systolic pressure.  3. MR is eccentric an directed posterior into L atrium . Moderate mitral valve regurgitation.  4. Tricuspid valve regurgitation is moderate.  5. The aortic valve is tricuspid. Aortic valve regurgitation is not visualized. Mild aortic valve sclerosis is present, with no evidence of aortic valve stenosis.  6. The inferior vena cava is normal in size with greater than 50% respiratory variability, suggesting right atrial pressure of 3 mmHg.  EKG RBBB  Stress Test 2021 The left ventricular ejection fraction is hyperdynamic (>65%). Nuclear stress EF: 67%. There was no ST segment deviation noted during stress. No T wave inversion was noted during stress. Impression: 1. This is an un-interpretable myocardial perfusion imaging study due to extracardiac activity. Per review of the raw images, there was profound gut uptake artifact.  2. Would recommend to re-scan the patient or to pursue a CCTA if able.     Neuro/Psych negative neurological ROS  negative psych ROS    GI/Hepatic Neg liver ROS, GERD  Medicated,  Endo/Other  negative endocrine ROS  Renal/GU Renal InsufficiencyRenal disease  negative genitourinary   Musculoskeletal  (+) Arthritis , Rheumatoid disorders,    Abdominal   Peds  Hematology  (+) Blood dyscrasia (Hgb 7.9), anemia ,   Anesthesia Other Findings EGD for dysphagia  Reproductive/Obstetrics                            Anesthesia Physical Anesthesia Plan  ASA: III  Anesthesia Plan: MAC   Post-op Pain Management:    Induction: Intravenous  PONV Risk Score and Plan: 2 and Propofol infusion and Treatment may vary due to age or medical condition  Airway Management Planned: Natural Airway  Additional Equipment:   Intra-op Plan:   Post-operative Plan:   Informed Consent: I have reviewed the patients History and Physical, chart, labs and discussed the procedure including the risks, benefits and alternatives for the proposed anesthesia with the patient or authorized representative who has indicated his/her understanding and acceptance.     Dental advisory given  Plan Discussed with: CRNA  Anesthesia Plan Comments:         Anesthesia Quick Evaluation

## 2020-02-27 NOTE — Transfer of Care (Signed)
Immediate Anesthesia Transfer of Care Note  Patient: Deanna Cunningham  Procedure(s) Performed: ESOPHAGOGASTRODUODENOSCOPY (EGD) WITH PROPOFOL (N/A ) SAVORY DILATION (N/A )  Patient Location: PACU  Anesthesia Type:MAC  Level of Consciousness: sedated, patient cooperative and responds to stimulation  Airway & Oxygen Therapy: Patient Spontanous Breathing and Patient connected to face mask oxygen  Post-op Assessment: Report given to RN and Post -op Vital signs reviewed and stable  Post vital signs: Reviewed and stable  Last Vitals:  Vitals Value Taken Time  BP    Temp    Pulse    Resp 17 02/27/20 1245  SpO2    Vitals shown include unvalidated device data.  Last Pain:  Vitals:   02/27/20 1143  TempSrc: Oral  PainSc: 0-No pain         Complications: No complications documented.

## 2020-02-27 NOTE — Discharge Instructions (Signed)
YOU HAD AN ENDOSCOPIC PROCEDURE TODAY: Refer to the procedure report and other information in the discharge instructions given to you for any specific questions about what was found during the examination. If this information does not answer your questions, please call [facility] at [phone] to clarify.   YOU SHOULD EXPECT: Some feelings of bloating in the abdomen. Passage of more gas than usual. Walking can help get rid of the air that was put into your GI tract during the procedure and reduce the bloating. If you had a lower endoscopy (such as a colonoscopy or flexible sigmoidoscopy) you may notice spotting of blood in your stool or on the toilet paper. Some abdominal soreness may be present for a day or two, also.  DIET: Your first meal following the procedure should be a light meal and then it is ok to progress to your normal diet. A half-sandwich or bowl of soup is an example of a good first meal. Heavy or fried foods are harder to digest and may make you feel nauseous or bloated. Drink plenty of fluids but you should avoid alcoholic beverages for 24 hours. If you had an esophageal dilation, please see attached information for diet.   ACTIVITY: Your care partner should take you home directly after the procedure. You should plan to take it easy, moving slowly for the rest of the day. You can resume normal activity the day after the procedure however YOU SHOULD NOT DRIVE, use power tools, machinery or perform tasks that involve climbing or major physical exertion for 24 hours (because of the sedation medicines used during the test).   SYMPTOMS TO REPORT IMMEDIATELY: Following upper endoscopy (EGD, EUS, ERCP, esophageal dilation) Vomiting of blood or coffee ground material  New, significant abdominal pain  New, significant chest pain or pain under the shoulder blades  Painful or persistently difficult swallowing  New shortness of breath  Black, tarry-looking or red, bloody  stools  FOLLOW UP:  If any biopsies were taken you will be contacted by phone or by letter within the next 1-3 weeks. Call [phone]  if you have not heard about the biopsies in 3 weeks.  Please also call with any specific questions about appointments or follow up tests.

## 2020-02-27 NOTE — H&P (Signed)
  Deanna Cunningham HPI: The patient was last dilated on 02/04/2020 with a 12.8 mm Savary dilator.  A good mucosal break was achieved in the cervical esophagus.  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  Past Medical History:  Diagnosis Date  . Anemia   . Anemia in chronic renal disease   . Arthritis    rheumatoid  . CKD (chronic kidney disease), stage III   . Dysphagia   . GERD (gastroesophageal reflux disease)   . Osteopenia 04/2018   T score -2.0 FRAX 4% / 0.9%  . Renal disorder   . Right bundle branch block 05/17/2013  . Secondary hyperparathyroidism, renal ([place])   . Syncope and collapse 05/17/2013    Past Surgical History:  Procedure Laterality Date  . arthritis    . ESOPHAGOGASTRODUODENOSCOPY N/A 02/20/2014   Procedure: ESOPHAGOGASTRODUODENOSCOPY (EGD);  Surgeon: [name], MD;  Location: [facility];  Service: Gastroenterology;  Laterality: N/A;  . ESOPHAGOGASTRODUODENOSCOPY (EGD) WITH ESOPHAGEAL DILATION    . ESOPHAGOGASTRODUODENOSCOPY (EGD) WITH PROPOFOL Left 02/04/2020   Procedure: ESOPHAGOGASTRODUODENOSCOPY (EGD) WITH PROPOFOL;  Surgeon: Carol Ada, MD;  Location: [place];  Service: Endoscopy;  Laterality: Left;  . SAVORY DILATION N/A 02/04/2020   Procedure: SAVORY DILATION;  Surgeon: Carol Ada, MD;  Location: [facility];  Service: Endoscopy;  Laterality: N/A;    Family History  Problem Relation Age of Onset  . Congestive Heart Failure Mother   . Diabetes Mother   . Hypertension Mother   . Diabetes Sister   . Hypertension Sister   . Cancer Brother        tumor removed from bladder  . Diabetes Brother   . Hypertension Brother     Social History:  reports that she has never smoked. She has never used smokeless tobacco. She reports that she does not drink alcohol and does not use drugs.  Allergies:  Allergies  Allergen Reactions  . Lactose Intolerance (Gi)     Medications:  Scheduled:  Continuous: . sodium  chloride      No results found for this or any previous visit (from the past 24 hour(s)).   No results found.  ROS:  As stated above in the HPI otherwise negative.  There were no vitals taken for this visit.    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  Deanna Cunningham D 02/27/2020, 10:27 AM

## 2020-02-27 NOTE — Op Note (Signed)
[facility] Patient Name: Deanna Cunningham Procedure Date: 02/27/2020 MRN: [id number] Attending MD: Carol Ada , MD Date of Birth: [birth year] CSN: [id number] Age: 74 Admit Type: Outpatient Procedure:                Upper GI endoscopy Indications:              Therapeutic procedure Providers:                Carol Ada, MD, Vista Lawman, RN, Theodora Blow,                            Technician Referring MD:              Medicines:                Propofol per Anesthesia Complications:            No immediate complications. Estimated Blood Loss:     Estimated blood loss was minimal. Procedure:                Pre-Anesthesia Assessment:                           - Prior to the procedure, a History and Physical                            was performed, and patient medications and                            allergies were reviewed. The patient's tolerance of                            previous anesthesia was also reviewed. The risks                            and benefits of the procedure and the sedation                            options and risks were discussed with the patient.                            All questions were answered, and informed consent                            was obtained. Prior Anticoagulants: The patient has                            taken no previous anticoagulant or antiplatelet                            agents. ASA Grade Assessment: III - A patient with                            severe systemic disease. After reviewing the risks  and benefits, the patient was deemed in                            satisfactory condition to undergo the procedure.                           - Sedation was administered by an anesthesia                            professional. Deep sedation was attained.                           After obtaining informed consent, the endoscope was                            passed under direct vision.  Throughout the                            procedure, the patient's blood pressure, pulse, and                            oxygen saturations were monitored continuously. The                            GIF-H190 ([id number]) Olympus gastroscope was                            introduced through the mouth, and advanced to the                            second part of duodenum. The upper GI endoscopy was                            accomplished without difficulty. The patient                            tolerated the procedure well. Scope In: Scope Out: Findings:      A web was found at the cricopharyngeus. A guidewire was placed and the       scope was withdrawn. Dilation was performed with a Savary dilator with       no resistance at 15 mm. The dilation site was examined following       endoscope reinsertion and showed complete resolution of luminal       narrowing. Estimated blood loss was minimal.      A 3 cm hiatal hernia was present.      The stomach was normal.      The examined duodenum was normal.      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 It was more patent       compared to the prior EGD performed 3 weeks ago as the endoscope moved       freely through the area. The esophagus was dilated and with the       significant fat loss, the thoracic spine was noted to be indenting into       the lumen of the esophagus.  In the distal esophagus there was again       stasis for food content. A 3 cm hiatal hernia was found, but distal to       this area, the gastric lumen did not appear normal. There appeared to be       distortion of the lumen. Dilation was performed with the 12.8 mm Savary       over a guidewire. Measurements were obtained to focus on dilating the       cervical esophageal web. A relook EGD was performed after the 14 mm       Savary dlation. The expected mucosal break was noted and it was felt       that she could tolerate further dilation at 15 mm. The 15 mm Savary        passed without difficulty and a final relook EGD showed the mucosal       break. There was no evidence of crepitus and the dilations were       successful. 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 Dilated.                           - 3 cm hiatal hernia.                           - Normal stomach.                           - Normal examined duodenum.                           - No specimens collected. Moderate Sedation:      Not Applicable - Patient had care per Anesthesia. Recommendation:           - Patient has a contact number available for                            emergencies. The signs and symptoms of potential                            delayed complications were discussed with the                            patient. Return to normal activities tomorrow.                            Written discharge instructions were provided to the                            patient.                           - Resume previous diet.                           - Continue present medications.                           -  Repeat upper endoscopy in 2 weeks for retreatment. Procedure Code(s):        --- Professional ---                           [phone], Esophagogastroduodenoscopy, flexible,                            transoral; with insertion of guide wire followed by                            passage of dilator(s) through esophagus over guide                            wire Diagnosis Code(s):        --- Professional ---                           Q39.4, Esophageal web                           K44.9, Diaphragmatic hernia without obstruction or                            gangrene CPT copyright 2019 American Medical Association. All rights reserved. The codes documented in this report are preliminary and upon coder review may  be revised to meet current compliance requirements. Carol Ada, MD Carol Ada, MD 02/27/2020 12:49:21 PM This report has been signed  electronically. Number of Addenda: 0

## 2020-03-01 ENCOUNTER — Encounter (HOSPITAL_COMMUNITY): Payer: Self-pay | Admitting: Gastroenterology

## 2020-03-05 ENCOUNTER — Other Ambulatory Visit: Payer: Self-pay | Admitting: Gastroenterology

## 2020-03-30 ENCOUNTER — Other Ambulatory Visit (HOSPITAL_COMMUNITY)
Admission: RE | Admit: 2020-03-30 | Discharge: 2020-03-30 | Disposition: A | Discharge status: Home / Self Care | Payer: BC Managed Care – PPO | Source: Ambulatory Visit | Attending: Gastroenterology | Admitting: Gastroenterology

## 2020-03-30 DIAGNOSIS — Z20822 Contact with and (suspected) exposure to covid-19: Secondary | ICD-10-CM | POA: Diagnosis not present

## 2020-03-30 LAB — SARS CORONAVIRUS 2 (TAT 6-24 HRS): SARS Coronavirus 2: NEGATIVE

## 2020-04-02 ENCOUNTER — Encounter (HOSPITAL_COMMUNITY): Payer: Self-pay | Admitting: Gastroenterology

## 2020-04-02 ENCOUNTER — Ambulatory Visit (HOSPITAL_COMMUNITY)
Admission: RE | Admit: 2020-04-02 | Discharge: 2020-04-02 | Disposition: A | Discharge status: Home / Self Care | Payer: BC Managed Care – PPO | Attending: Gastroenterology | Admitting: Gastroenterology

## 2020-04-02 ENCOUNTER — Other Ambulatory Visit: Payer: Self-pay

## 2020-04-02 ENCOUNTER — Ambulatory Visit (HOSPITAL_COMMUNITY): Payer: BC Managed Care – PPO | Admitting: Anesthesiology

## 2020-04-02 ENCOUNTER — Encounter (HOSPITAL_COMMUNITY)
Admission: RE | Disposition: A | Discharge status: Home / Self Care | Payer: Self-pay | Source: Home / Self Care | Attending: Gastroenterology

## 2020-04-02 DIAGNOSIS — K449 Diaphragmatic hernia without obstruction or gangrene: Secondary | ICD-10-CM | POA: Insufficient documentation

## 2020-04-02 DIAGNOSIS — N2581 Secondary hyperparathyroidism of renal origin: Secondary | ICD-10-CM | POA: Insufficient documentation

## 2020-04-02 DIAGNOSIS — K229 Disease of esophagus, unspecified: Secondary | ICD-10-CM | POA: Insufficient documentation

## 2020-04-02 DIAGNOSIS — N183 Chronic kidney disease, stage 3 unspecified: Secondary | ICD-10-CM | POA: Diagnosis not present

## 2020-04-02 DIAGNOSIS — M8580 Other specified disorders of bone density and structure, unspecified site: Secondary | ICD-10-CM | POA: Insufficient documentation

## 2020-04-02 DIAGNOSIS — R627 Adult failure to thrive: Secondary | ICD-10-CM | POA: Insufficient documentation

## 2020-04-02 DIAGNOSIS — K222 Esophageal obstruction: Secondary | ICD-10-CM | POA: Insufficient documentation

## 2020-04-02 HISTORY — PX: SAVORY DILATION: SHX5439

## 2020-04-02 HISTORY — PX: ESOPHAGOGASTRODUODENOSCOPY (EGD) WITH PROPOFOL: SHX5813

## 2020-04-02 LAB — COMPREHENSIVE METABOLIC PANEL
ALT: 9 U/L (ref 0–44)
AST: 14 U/L — ABNORMAL LOW (ref 15–41)
Albumin: 3.6 g/dL (ref 3.5–5.0)
Alkaline Phosphatase: 65 U/L (ref 38–126)
Anion gap: 6 (ref 5–15)
BUN: 33 mg/dL — ABNORMAL HIGH (ref 8–23)
CO2: 27 mmol/L (ref 22–32)
Calcium: 9.4 mg/dL (ref 8.9–10.3)
Chloride: 105 mmol/L (ref 98–111)
Creatinine, Ser: 0.68 mg/dL (ref 0.44–1.00)
GFR calc Af Amer: >60 mL/min (ref >60)
GFR calc non Af Amer: >60 mL/min (ref >60)
Glucose, Bld: 81 mg/dL (ref 70–99)
Potassium: 3.9 mmol/L (ref 3.5–5.1)
Sodium: 138 mmol/L (ref 135–145)
Total Bilirubin: 0.4 mg/dL (ref 0.3–1.2)
Total Protein: 7.7 g/dL (ref 6.5–8.1)

## 2020-04-02 SURGERY — ESOPHAGOGASTRODUODENOSCOPY (EGD) WITH PROPOFOL
Anesthesia: Monitor Anesthesia Care

## 2020-04-02 MED ORDER — HEPARIN SOD (PORK) LOCK FLUSH 100 UNIT/ML IV SOLN
250.0000 [IU] | INTRAVENOUS | Status: AC | PRN
  Administered 2020-04-02: 250 [IU]

## 2020-04-02 MED ORDER — LACTATED RINGERS IV SOLN: INTRAVENOUS | Status: DC

## 2020-04-02 MED ORDER — PROPOFOL 10 MG/ML IV BOLUS
INTRAVENOUS | Status: DC | PRN
  Administered 2020-04-02 (×3): 10 mg via INTRAVENOUS

## 2020-04-02 MED ORDER — SODIUM CHLORIDE 0.9 % IV SOLN: INTRAVENOUS | Status: DC

## 2020-04-02 MED ORDER — PROPOFOL 500 MG/50ML IV EMUL
INTRAVENOUS | Status: DC | PRN
  Administered 2020-04-02: 125 ug/kg/min via INTRAVENOUS

## 2020-04-02 SURGICAL SUPPLY — 15 items

## 2020-04-02 NOTE — Progress Notes (Signed)
Pt awaiting with daughter in main hospital waiting room for ride home, refused to wait in endo, pt remains in wheelchair with daughter at side, endo notified company to pick pt up

## 2020-04-02 NOTE — Discharge Instructions (Signed)

## 2020-04-02 NOTE — Anesthesia Preprocedure Evaluation (Addendum)
Anesthesia Evaluation  Patient identified by MRN, date of birth, ID band Patient awake    Reviewed: Allergy & Precautions, NPO status , Patient's Chart, lab work & pertinent test results  History of Anesthesia Complications Negative for: history of anesthetic complications  Airway Mallampati: II  TM Distance: >3 FB Neck ROM: Full    Dental  (+) Edentulous Upper, Edentulous Lower   Pulmonary neg pulmonary ROS,    Pulmonary exam normal        Cardiovascular Normal cardiovascular exam+ Valvular Problems/Murmurs MR    '21 Myoperfusion - The left ventricular ejection fraction is hyperdynamic (>65%). Nuclear stress EF: 67%. There was no ST segment deviation noted during stress. No T wave inversion was noted during stress. 1. This is an un-interpretable myocardial perfusion imaging study due to extracardiac activity. Per review of the raw images, there was profound gut uptake artifact.  2. Would recommend to re-scan the patient or to pursue a CCTA if able.   '21 TTE - EF 65 to 70%. Mildly elevated pulmonary artery systolic pressure. MR is eccentric an directed posterior into L atrium . Moderate mitral valve regurgitation. Tricuspid valve regurgitation is moderate. Mild aortic valve sclerosis is present, with no evidence of aortic valve stenosis.     Neuro/Psych negative neurological ROS  negative psych ROS   GI/Hepatic Neg liver ROS, GERD  Medicated and Controlled, Dysphagia, esophageal stricture    Endo/Other  negative endocrine ROS  Renal/GU CRFRenal disease     Musculoskeletal  (+) Arthritis ,   Abdominal   Peds  Hematology negative hematology ROS (+)   Anesthesia Other Findings Covid test negative   Reproductive/Obstetrics                            Anesthesia Physical Anesthesia Plan  ASA: III  Anesthesia Plan: MAC   Post-op Pain Management:    Induction: Intravenous  PONV  Risk Score and Plan: 2 and Propofol infusion and Treatment may vary due to age or medical condition  Airway Management Planned: Nasal Cannula and Natural Airway  Additional Equipment: None  Intra-op Plan:   Post-operative Plan:   Informed Consent: I have reviewed the patients History and Physical, chart, labs and discussed the procedure including the risks, benefits and alternatives for the proposed anesthesia with the patient or authorized representative who has indicated his/her understanding and acceptance.       Plan Discussed with: CRNA and Anesthesiologist  Anesthesia Plan Comments:        Anesthesia Quick Evaluation

## 2020-04-02 NOTE — Anesthesia Postprocedure Evaluation (Signed)
Anesthesia Post Note  Patient: Deanna Cunningham  Procedure(s) Performed: ESOPHAGOGASTRODUODENOSCOPY (EGD) WITH PROPOFOL (N/A )     Patient location during evaluation: PACU Anesthesia Type: MAC Level of consciousness: awake and alert Pain management: pain level controlled Vital Signs Assessment: post-procedure vital signs reviewed and stable Respiratory status: spontaneous breathing, nonlabored ventilation and respiratory function stable Cardiovascular status: stable and blood pressure returned to baseline Anesthetic complications: no   No complications documented.  Last Vitals:  Vitals:   04/02/20 1150 04/02/20 1200  BP: 140/67 137/69  Pulse: 79 79  Resp: 13 15  Temp:    SpO2: 100% 99%    Last Pain:  Vitals:   04/02/20 1150  TempSrc:   PainSc: 0-No pain                 Audry Pili

## 2020-04-02 NOTE — Anesthesia Procedure Notes (Signed)
Procedure Name: MAC Date/Time: 04/02/2020 11:12 AM Performed by: Niel Hummer, CRNA Pre-anesthesia Checklist: Patient identified, Emergency Drugs available, Suction available and Patient being monitored Oxygen Delivery Method: Simple face mask

## 2020-04-02 NOTE — H&P (Signed)
  Deanna Cunningham HPI: The patient was successfully dilated with a 15 mm Savary dilator one month ago without any complications.  Deanna Cunningham here to ensure that the cervical esophageal web stays patent.  After discussion with Deanna Cunningham brother, the consensus was to pursue a PEG-J placement for long-term nutritional supplementation.  Past Medical History:  Diagnosis Date  . Anemia   . Anemia in chronic renal disease   . Arthritis    rheumatoid  . CKD (chronic kidney disease), stage III   . Dysphagia   . GERD (gastroesophageal reflux disease)   . Osteopenia 04/2018   T score -2.0 FRAX 4% / 0.9%  . Renal disorder   . Right bundle branch block 05/17/2013  . Secondary hyperparathyroidism, renal ([place])   . Syncope and collapse 05/17/2013    Past Surgical History:  Procedure Laterality Date  . arthritis    . ESOPHAGOGASTRODUODENOSCOPY N/A 02/20/2014   Procedure: ESOPHAGOGASTRODUODENOSCOPY (EGD);  Surgeon: [name], MD;  Location: [facility];  Service: Gastroenterology;  Laterality: N/A;  . ESOPHAGOGASTRODUODENOSCOPY (EGD) WITH ESOPHAGEAL DILATION    . ESOPHAGOGASTRODUODENOSCOPY (EGD) WITH PROPOFOL Left 02/04/2020   Procedure: ESOPHAGOGASTRODUODENOSCOPY (EGD) WITH PROPOFOL;  Surgeon: Carol Ada, MD;  Location: [place];  Service: Endoscopy;  Laterality: Left;  . ESOPHAGOGASTRODUODENOSCOPY (EGD) WITH PROPOFOL N/A 02/27/2020   Procedure: ESOPHAGOGASTRODUODENOSCOPY (EGD) WITH PROPOFOL;  Surgeon: Carol Ada, MD;  Location: [facility];  Service: Endoscopy;  Laterality: N/A;  . SAVORY DILATION N/A 02/04/2020   Procedure: SAVORY DILATION;  Surgeon: Carol Ada, MD;  Location: [place];  Service: Endoscopy;  Laterality: N/A;  . SAVORY DILATION N/A 02/27/2020   Procedure: SAVORY DILATION;  Surgeon: Carol Ada, MD;  Location: [facility];  Service: Endoscopy;  Laterality: N/A;    Family History  Problem Relation Age of Onset  . Congestive Heart Failure Mother   . Diabetes Mother   .  Hypertension Mother   . Diabetes Sister   . Hypertension Sister   . Cancer Brother        tumor removed from bladder  . Diabetes Brother   . Hypertension Brother     Social History:  reports that Deanna Cunningham has never smoked. Deanna Cunningham has never used smokeless tobacco. Deanna Cunningham reports that Deanna Cunningham does not drink alcohol and does not use drugs.  Allergies:  Allergies  Allergen Reactions  . Lactose Intolerance (Gi)     Medications:  Scheduled:  Continuous: . lactated ringers 20 mL/hr at 04/02/20 1040    No results found for this or any previous visit (from the past 24 hour(s)).   No results found.  ROS:  As stated above in the HPI otherwise negative.  Blood pressure 135/70, pulse 78, temperature 97.7 F (36.5 C), temperature source Oral, resp. rate 13, height 5\' 4"  (1.626 m), weight 42.6 kg, SpO2 100 %.    PE: Gen: NAD, Alert and Oriented HEENT:  [place]/AT, EOMI Neck: Supple, no LAD Lungs: CTA Bilaterally CV: RRR without M/G/R ABD: Soft, NTND, +BS Ext: No C/C/E  Assessment/Plan: 1) Cervical esophageal web. 2) Failure to thrive.  Plan: 1) EGD with dilation again, if necessary.   [name] D 04/02/2020, 10:55 AM

## 2020-04-02 NOTE — Op Note (Signed)
[facility] Patient Name: Deanna Cunningham Procedure Date: 04/02/2020 MRN: [id number] Attending MD: Carol Ada , MD Date of Birth: 1945/10/05 CSN: [id number] Age: 74 Admit Type: Outpatient Procedure:                Upper GI endoscopy Indications:              Therapeutic procedure Providers:                Carol Ada, MD, Benay Pillow, RN, Corie Chiquito,                            Technician, Herbie Drape, CRNA Referring MD:              Medicines:                Propofol per Anesthesia Complications:            No immediate complications. Estimated Blood Loss:     Estimated blood loss: none. Procedure:                Pre-Anesthesia Assessment:                           - Prior to the procedure, a History and Physical                            was performed, and patient medications and                            allergies were reviewed. The patient's tolerance of                            previous anesthesia was also reviewed. The risks                            and benefits of the procedure and the sedation                            options and risks were discussed with the patient.                            All questions were answered, and informed consent                            was obtained. Prior Anticoagulants: The patient has                            taken no previous anticoagulant or antiplatelet                            agents. ASA Grade Assessment: III - A patient with                            severe systemic disease. After reviewing the risks  and benefits, the patient was deemed in                            satisfactory condition to undergo the procedure.                           - Sedation was administered by an anesthesia                            professional. Deep sedation was attained.                           After obtaining informed consent, the endoscope was                            passed under  direct vision. Throughout the                            procedure, the patient's blood pressure, pulse, and                            oxygen saturations were monitored continuously. The                            GIF-H190 ([id number]) Olympus gastroscope was                            introduced through the mouth, and advanced to the                            second part of duodenum. The upper GI endoscopy was                            accomplished without difficulty. The patient                            tolerated the procedure well. Scope In: Scope Out: Findings:      One benign-appearing, intrinsic mild stenosis was found at the       cricopharyngeus. This stenosis measured 1.3 cm (inner diameter) x less       than one cm (in length). The stenosis was traversed. A guidewire was       placed and the scope was withdrawn. Dilation was performed with a Savary       dilator with no resistance at 15 mm. The dilation site was examined       following endoscope reinsertion and showed complete resolution of       luminal narrowing. Estimated blood loss: none.      Diffuse, white plaques were found in the middle third of the esophagus       and in the lower third of the esophagus.      A 3 cm hiatal hernia was present.      The stomach was normal.      The examined duodenum was normal. Impression:               - Benign-appearing esophageal  stenosis. Dilated.                           - Esophageal plaques were found, consistent with                            candidiasis.                           - 3 cm hiatal hernia.                           - Normal stomach.                           - Normal examined duodenum.                           - No specimens collected. Moderate Sedation:      Not Applicable - Patient had care per Anesthesia. Recommendation:           - Patient has a contact number available for                            emergencies. The signs and symptoms of potential                             delayed complications were discussed with the                            patient. Return to normal activities tomorrow.                            Written discharge instructions were provided to the                            patient.                           - Resume previous diet.                           - Continue present medications.                           - The patient will decide if she wants to persue a                            PEG-J.                           - Check CMET today for her albumin level.                            Previously in May it was 2.6.                           - Treat  with fluconazole 100 mg QD x 10 days. Procedure Code(s):        --- Professional ---                           [phone], Esophagogastroduodenoscopy, flexible,                            transoral; with insertion of guide wire followed by                            passage of dilator(s) through esophagus over guide                            wire Diagnosis Code(s):        --- Professional ---                           K22.2, Esophageal obstruction                           K22.9, Disease of esophagus, unspecified                           K44.9, Diaphragmatic hernia without obstruction or                            gangrene CPT copyright 2019 American Medical Association. All rights reserved. The codes documented in this report are preliminary and upon coder review may  be revised to meet current compliance requirements. Carol Ada, MD Carol Ada, MD 04/02/2020 11:33:57 AM This report has been signed electronically. Number of Addenda: 0

## 2020-04-02 NOTE — Progress Notes (Signed)
Pt ready for dc home, awaiting for iv team to address PICC line

## 2020-04-02 NOTE — Transfer of Care (Signed)
Immediate Anesthesia Transfer of Care Note  Patient: Deanna Cunningham  Procedure(s) Performed: ESOPHAGOGASTRODUODENOSCOPY (EGD) WITH PROPOFOL (N/A )  Patient Location: PACU  Anesthesia Type:MAC  Level of Consciousness: awake, alert  and oriented  Airway & Oxygen Therapy: Patient Spontanous Breathing and Patient connected to face mask oxygen  Post-op Assessment: Report given to RN, Post -op Vital signs reviewed and stable and Patient moving all extremities X 4  Post vital signs: Reviewed and stable  Last Vitals:  Vitals Value Taken Time  BP    Temp    Pulse    Resp    SpO2      Last Pain:  Vitals:   04/02/20 1030  TempSrc: Oral  PainSc: 0-No pain         Complications: No complications documented.

## 2020-04-12 ENCOUNTER — Telehealth: Payer: Self-pay | Admitting: *Deleted

## 2020-04-12 NOTE — Telephone Encounter (Signed)
Spoke w/daughter who reports patient just returned home on 04/07/20 from staying in SNF. Has home health RN per [facility] coming to home twice weekly. Just admitted to services on Friday. Called [facility] and spoke w/Jeff in [facility] office. There is no lab work planned for her at this time. Per Dr. Benay Spice: Needs CBC/diff this week. Called [facility] and gave verbal order to [place]. They will have lab drawn this week and results faxed to [phone]. Confirmed that her PCP, Dr. Kristie Cowman is her attending w/[facility].

## 2020-06-23 DIAGNOSIS — N189 Chronic kidney disease, unspecified: Secondary | ICD-10-CM | POA: Insufficient documentation

## 2020-08-02 ENCOUNTER — Ambulatory Visit: Payer: BC Managed Care – PPO | Attending: Internal Medicine

## 2020-08-02 DIAGNOSIS — Z23 Encounter for immunization: Secondary | ICD-10-CM

## 2020-08-02 NOTE — Progress Notes (Signed)
   Covid-19 Vaccination Clinic  Name:  CHRISTABELL LOSEKE    MRN: [id number] DOB: Feb 15, 1946  08/02/2020  Ms. Hobin was observed post Covid-19 immunization for 15 minutes without incident. She was provided with Vaccine Information Sheet and instruction to access the V-Safe system.   Ms. Crenshaw was instructed to call 911 with any severe reactions post vaccine: Marland Kitchen Difficulty breathing  . Swelling of face and throat  . A fast heartbeat  . A bad rash all over body  . Dizziness and weakness   Immunizations Administered    Name Date Dose VIS Date Route   Pfizer COVID-19 Vaccine 08/02/2020  2:03 PM 0.3 mL 07/07/2020 Intramuscular   Manufacturer: [place]   Lot: [id number]   [place]: [id number]

## 2020-12-15 IMAGING — CT CT ABD-PELV W/ CM
2 of 5 series · 16 of 46 positions shown, 18 images · IV contrast (Omni 300)
Comparison: 08/08/2018

CLINICAL DATA: Painless jaundice and weight loss

EXAM:
CT ABDOMEN AND PELVIS WITH CONTRAST
TECHNIQUE: Multidetector CT imaging of the abdomen and pelvis was performed
using the standard protocol following bolus administration of
intravenous contrast.
CONTRAST:  100mL OMNIPAQUE IOHEXOL 300 MG/ML  SOLN

[Series 3: a/p w/ 5mm · axial · 0.91mm/px · z∈[+942,+1362]mm · 13 of 94 slices shown, 15 images]
[im 5/94  soft-tissue]
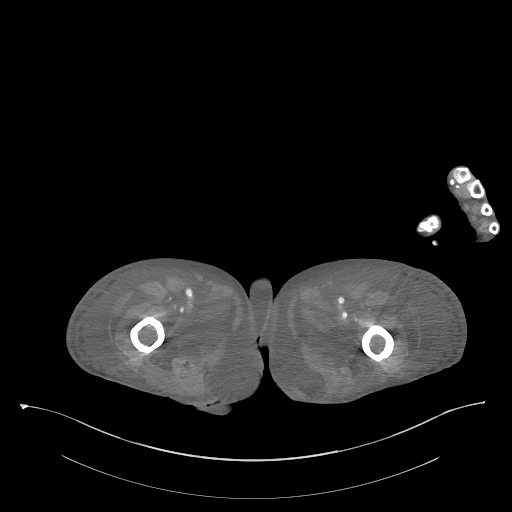
[im 5/94  bone]
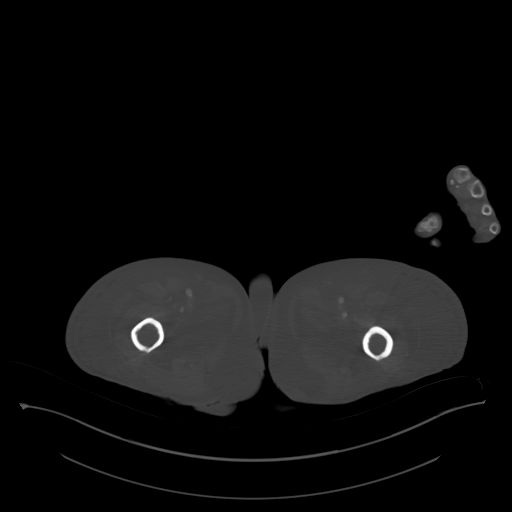
[im 14/94  soft-tissue]
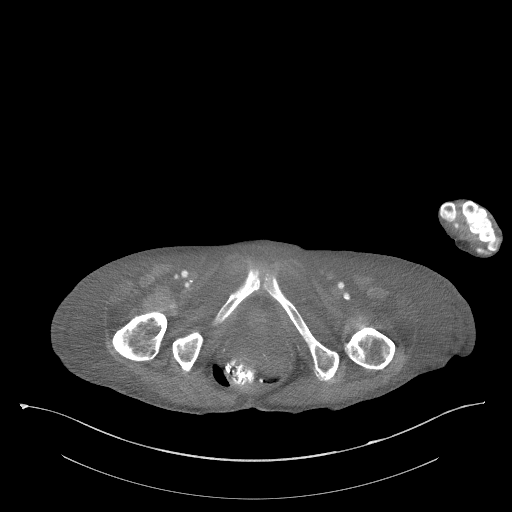
[im 18/94  soft-tissue]
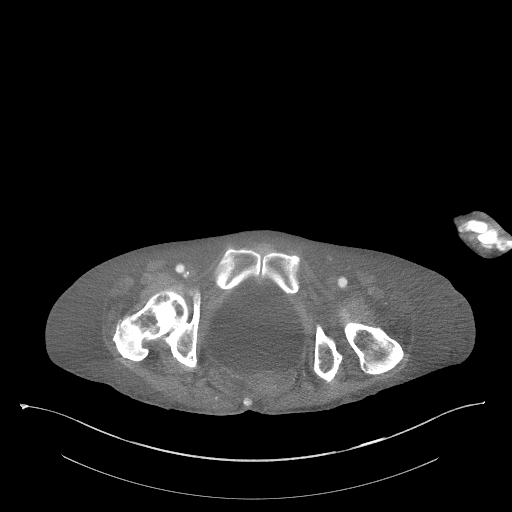
[im 27/94  soft-tissue]
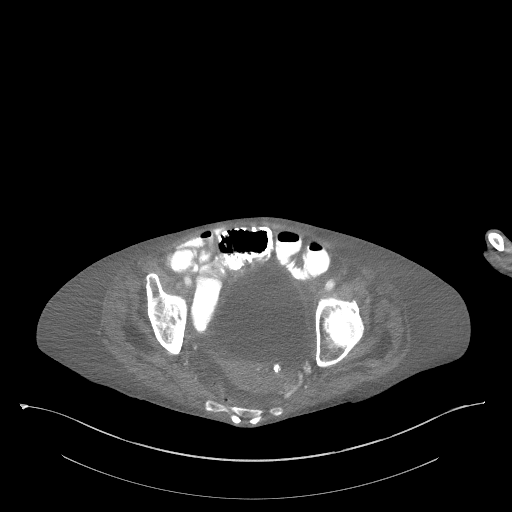
[im 32/94  soft-tissue]
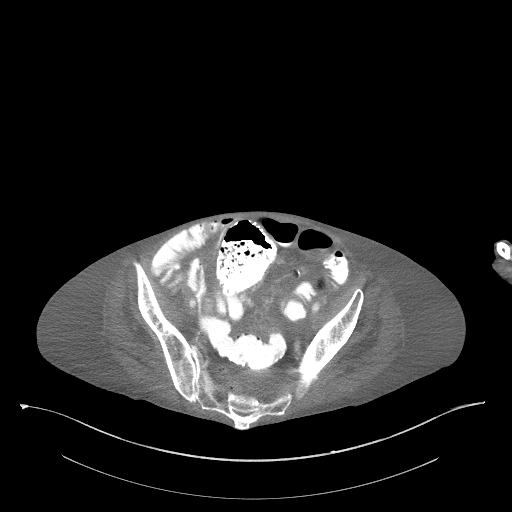
[im 40/94  soft-tissue]
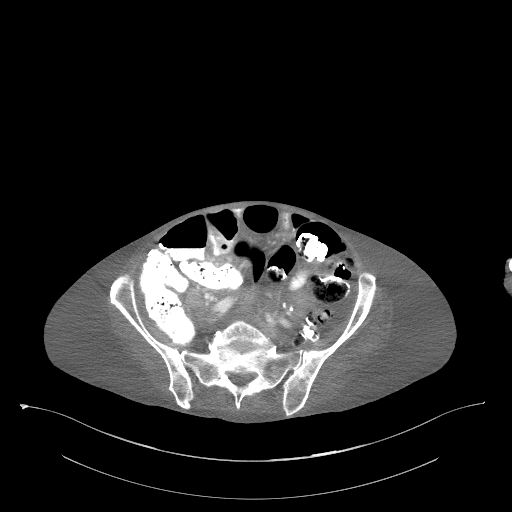
[im 49/94  soft-tissue]
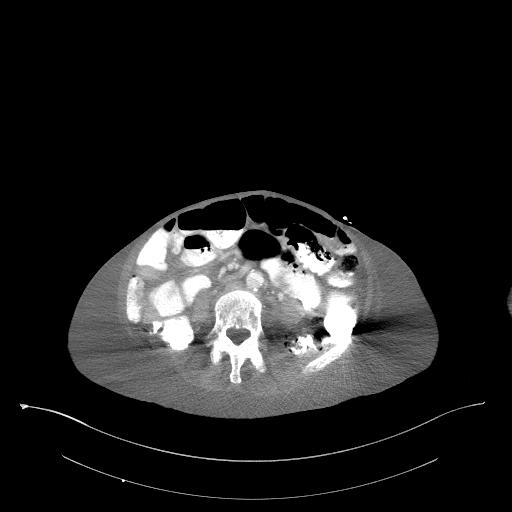
[im 54/94  soft-tissue]
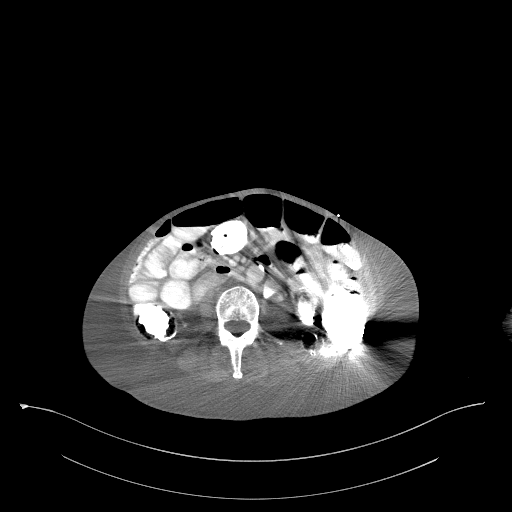
[im 63/94  soft-tissue]
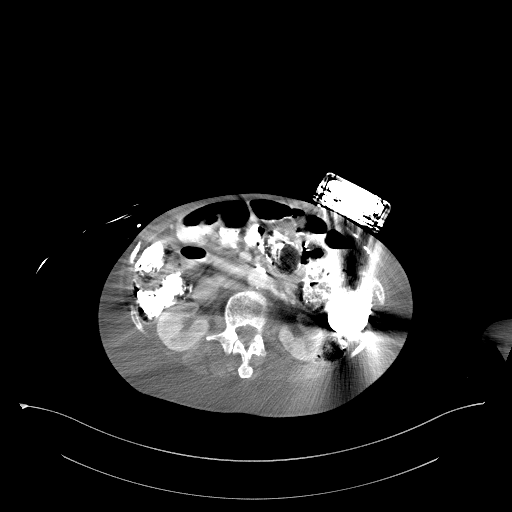
[im 63/94  bone]
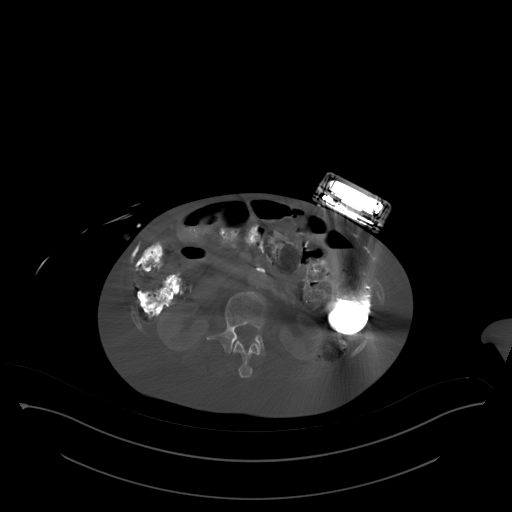
[im 67/94  soft-tissue]
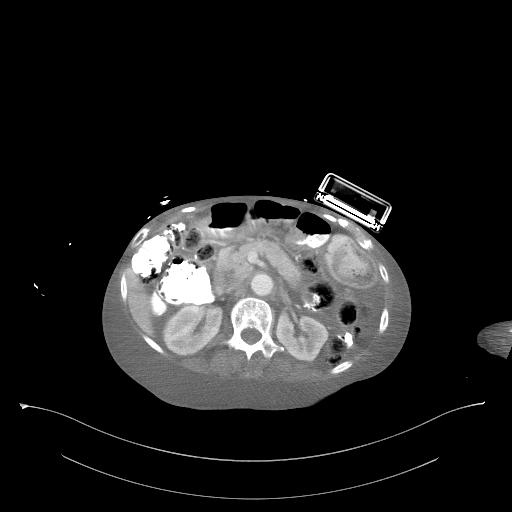
[im 76/94  soft-tissue]
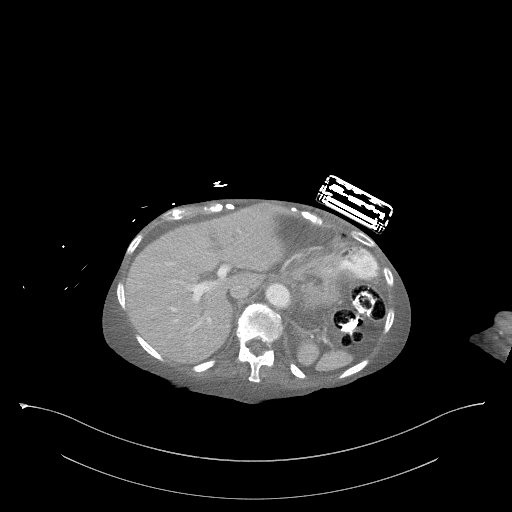
[im 80/94  soft-tissue]
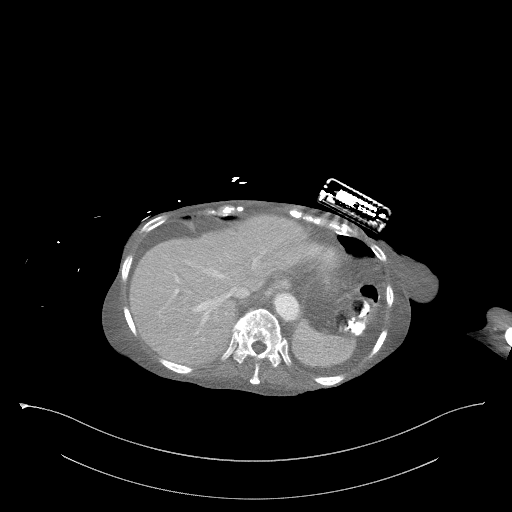
[im 89/94  soft-tissue]
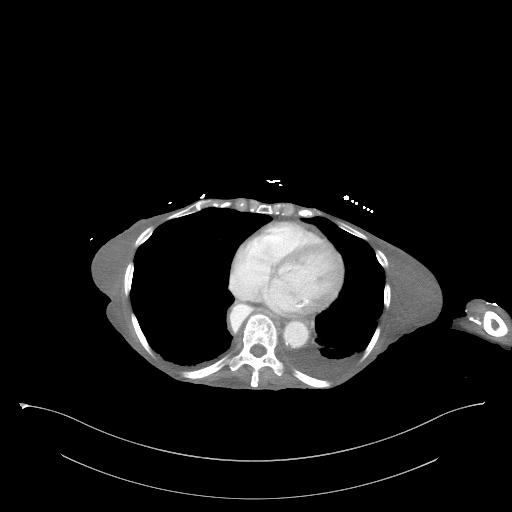

[Series 6: a/p w/ cor · coronal · 0.86mm/px · 3 of 143 slices shown]
[im 48/143  soft-tissue]
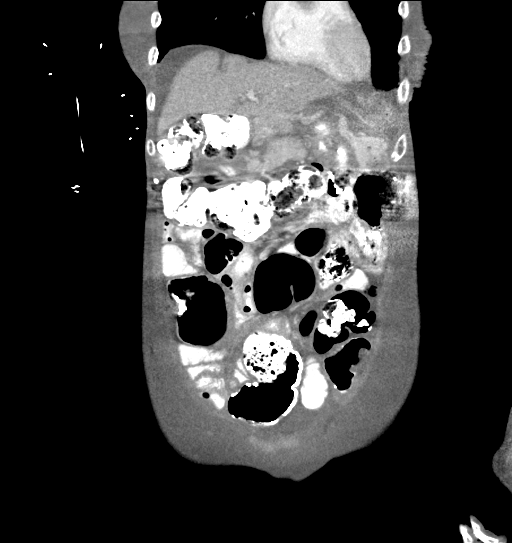
[im 64/143  soft-tissue]
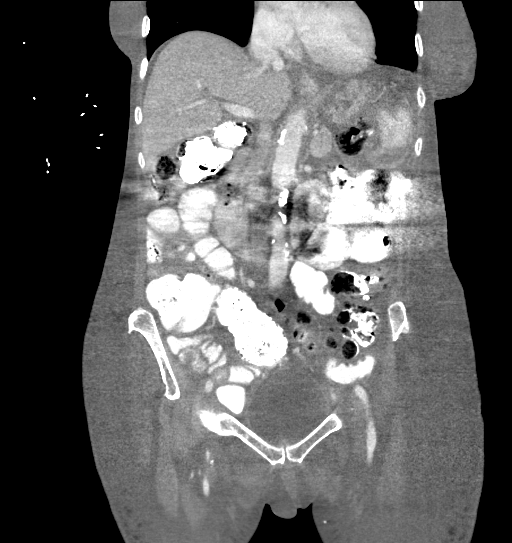
[im 79/143  soft-tissue]
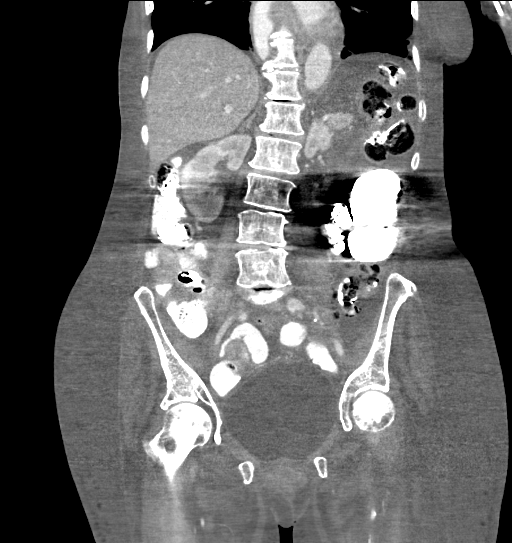

[16 of 46 positions shown; findings below may reference images not displayed]

FINDINGS: Lower chest: Emphysematous changes are noted in the bases
bilaterally. Small left pleural effusion is seen. No focal confluent
infiltrate is noted.

Hepatobiliary: Liver demonstrates fatty infiltration. The
gallbladder is decompressed. Perihepatic fluid is noted consistent
with mild ascites.

Pancreas: The pancreas is within normal limits.

Spleen: The spleen is unremarkable.

Adrenals/Urinary Tract: Adrenal glands are within normal limits.
Kidneys demonstrate a normal enhancement pattern bilaterally. No
obstructive changes are seen. The bladder is decompressed. Normal
excretion of contrast is seen on delayed images.

Stomach/Bowel: Diverticular change of the colon is noted without
evidence of diverticulitis. Considerable dense barium is noted
throughout the left colon with considerable scatter artifact. No
obstructive changes are seen. The appendix is not well visualized.
No obstructive changes in the small bowel are seen. The stomach is
decompressed.

Vascular/Lymphatic: Aortic calcifications are seen without
aneurysmal dilatation. No definitive lymphadenopathy is noted.

Reproductive: Uterus demonstrates scattered calcifications stable
from the prior exam. This may represent small fibroids. No adnexal
mass is noted.

Other: Mild free pelvic fluid is noted. Changes of anasarca are
noted throughout the abdomen and pelvis.

Musculoskeletal: Degenerative changes of lumbar spine are noted. No
acute bony abnormality is seen. Anterolisthesis of L4 on L5 is
noted.
IMPRESSION: Changes of anasarca and ascites. This is likely related underlying
liver disease.

Fatty liver.

Diverticulosis without diverticulitis.

No other definitive acute abnormality is noted. No explanation for
the patient's jaundice is noted

## 2021-03-01 ENCOUNTER — Other Ambulatory Visit: Payer: Self-pay | Admitting: Gastroenterology

## 2021-03-08 ENCOUNTER — Other Ambulatory Visit: Payer: Self-pay

## 2021-03-11 ENCOUNTER — Ambulatory Visit (HOSPITAL_COMMUNITY)
Admission: RE | Admit: 2021-03-11 | Discharge: 2021-03-11 | Disposition: A | Discharge status: Home / Self Care | Payer: BC Managed Care – PPO | Attending: Gastroenterology | Admitting: Gastroenterology

## 2021-03-11 ENCOUNTER — Encounter (HOSPITAL_COMMUNITY): Payer: Self-pay | Admitting: Gastroenterology

## 2021-03-11 ENCOUNTER — Other Ambulatory Visit: Payer: Self-pay

## 2021-03-11 ENCOUNTER — Ambulatory Visit (HOSPITAL_COMMUNITY): Payer: BC Managed Care – PPO | Admitting: Anesthesiology

## 2021-03-11 ENCOUNTER — Encounter (HOSPITAL_COMMUNITY)
Admission: RE | Disposition: A | Discharge status: Home / Self Care | Payer: Self-pay | Source: Home / Self Care | Attending: Gastroenterology

## 2021-03-11 DIAGNOSIS — K449 Diaphragmatic hernia without obstruction or gangrene: Secondary | ICD-10-CM | POA: Insufficient documentation

## 2021-03-11 DIAGNOSIS — R1310 Dysphagia, unspecified: Secondary | ICD-10-CM | POA: Diagnosis present

## 2021-03-11 HISTORY — PX: SAVORY DILATION: SHX5439

## 2021-03-11 HISTORY — PX: ESOPHAGOGASTRODUODENOSCOPY (EGD) WITH PROPOFOL: SHX5813

## 2021-03-11 SURGERY — ESOPHAGOGASTRODUODENOSCOPY (EGD) WITH PROPOFOL
Anesthesia: Monitor Anesthesia Care

## 2021-03-11 MED ORDER — LIDOCAINE 2% (20 MG/ML) 5 ML SYRINGE
INTRAMUSCULAR | Status: DC | PRN
  Administered 2021-03-11: 60 mg via INTRAVENOUS

## 2021-03-11 MED ORDER — SODIUM CHLORIDE 0.9 % IV SOLN: INTRAVENOUS | Status: DC

## 2021-03-11 MED ORDER — LACTATED RINGERS IV SOLN
INTRAVENOUS | Status: AC | PRN
  Administered 2021-03-11: 10 mL/h via INTRAVENOUS

## 2021-03-11 MED ORDER — PROPOFOL 10 MG/ML IV BOLUS
INTRAVENOUS | Status: AC
  Filled 2021-03-11: qty 20

## 2021-03-11 MED ORDER — PROPOFOL 500 MG/50ML IV EMUL
INTRAVENOUS | Status: DC | PRN
  Administered 2021-03-11: 100 ug/kg/min via INTRAVENOUS

## 2021-03-11 MED ORDER — PROPOFOL 10 MG/ML IV BOLUS
INTRAVENOUS | Status: DC | PRN
  Administered 2021-03-11 (×3): 20 mg via INTRAVENOUS

## 2021-03-11 SURGICAL SUPPLY — 15 items

## 2021-03-11 NOTE — Anesthesia Preprocedure Evaluation (Signed)
Anesthesia Evaluation  Patient identified by MRN, date of birth, ID band Patient awake    Reviewed: Allergy & Precautions, NPO status , Patient's Chart, lab work & pertinent test results  History of Anesthesia Complications Negative for: history of anesthetic complications  Airway Mallampati: II  TM Distance: >3 FB Neck ROM: Full    Dental  (+) Edentulous Upper, Edentulous Lower, Dental Advisory Given   Pulmonary neg pulmonary ROS,    Pulmonary exam normal breath sounds clear to auscultation       Cardiovascular Normal cardiovascular exam+ dysrhythmias + Valvular Problems/Murmurs MR  Rhythm:Regular   '21 Myoperfusion - The left ventricular ejection fraction is hyperdynamic (>65%). Nuclear stress EF: 67%. There was no ST segment deviation noted during stress. No T wave inversion was noted during stress. 1. This is an un-interpretable myocardial perfusion imaging study due to extracardiac activity. Per review of the raw images, there was profound gut uptake artifact.  2. Would recommend to re-scan the patient or to pursue a CCTA if able.   '21 TTE - EF 65 to 70%. Mildly elevated pulmonary artery systolic pressure. MR is eccentric an directed posterior into L atrium . Moderate mitral valve regurgitation. Tricuspid valve regurgitation is moderate. Mild aortic valve sclerosis is present, with no evidence of aortic valve stenosis.     Neuro/Psych negative neurological ROS  negative psych ROS   GI/Hepatic Neg liver ROS, GERD  Medicated and Controlled, Dysphagia, esophageal stricture    Endo/Other  negative endocrine ROS  Renal/GU CRFRenal disease     Musculoskeletal  (+) Arthritis ,   Abdominal   Peds  Hematology  (+) Blood dyscrasia, anemia ,   Anesthesia Other Findings Covid test negative   Reproductive/Obstetrics                             Anesthesia Physical  Anesthesia Plan  ASA:  3  Anesthesia Plan: MAC   Post-op Pain Management:    Induction: Intravenous  PONV Risk Score and Plan: 2 and Propofol infusion and Treatment may vary due to age or medical condition  Airway Management Planned: Nasal Cannula and Natural Airway  Additional Equipment: None  Intra-op Plan:   Post-operative Plan:   Informed Consent: I have reviewed the patients History and Physical, chart, labs and discussed the procedure including the risks, benefits and alternatives for the proposed anesthesia with the patient or authorized representative who has indicated his/her understanding and acceptance.       Plan Discussed with: CRNA and Anesthesiologist  Anesthesia Plan Comments:         Anesthesia Quick Evaluation

## 2021-03-11 NOTE — Anesthesia Procedure Notes (Signed)
Procedure Name: MAC Date/Time: 03/11/2021 2:05 PM Performed by: Lollie Sails, CRNA Pre-anesthesia Checklist: Patient identified, Emergency Drugs available, Suction available, Patient being monitored and Timeout performed Preoxygenation: POM Used.

## 2021-03-11 NOTE — Anesthesia Postprocedure Evaluation (Signed)
Anesthesia Post Note  Patient: Deanna Cunningham  Procedure(s) Performed: ESOPHAGOGASTRODUODENOSCOPY (EGD) WITH PROPOFOL SAVORY DILATION     Patient location during evaluation: PACU Anesthesia Type: MAC Level of consciousness: awake and alert Pain management: pain level controlled Vital Signs Assessment: post-procedure vital signs reviewed and stable Respiratory status: spontaneous breathing Cardiovascular status: stable Anesthetic complications: no   No notable events documented.  Last Vitals:  Vitals:   03/11/21 1256 03/11/21 1428  BP: (!) 119/49 (!) 103/52  Pulse: 98 71  Resp: 15 19  Temp: 36.7 C 36.7 C  SpO2: 100% 100%    Last Pain:  Vitals:   03/11/21 1428  TempSrc: Oral  PainSc: 0-No pain                 Nolon Nations

## 2021-03-11 NOTE — Op Note (Signed)
[facility] Patient Name: Deanna Cunningham Procedure Date: 03/11/2021 MRN: [id number] Attending MD: Carol Ada , MD Date of Birth: [birth year] CSN: [id number] Age: 75 Admit Type: Outpatient Procedure:                Upper GI endoscopy Indications:              Dysphagia Providers:                Carol Ada, MD, Dulcy Fanny, Elspeth Cho                            Tech., Technician, Edman Circle. Zenia Resides CRNA, CRNA Referring MD:              Medicines:                 Complications:            No immediate complications. Estimated Blood Loss:     Estimated blood loss: none. Procedure:                Pre-Anesthesia Assessment:                           - Prior to the procedure, a History and Physical                            was performed, and patient medications and                            allergies were reviewed. The patient's tolerance of                            previous anesthesia was also reviewed. The risks                            and benefits of the procedure and the sedation                            options and risks were discussed with the patient.                            All questions were answered, and informed consent                            was obtained. Prior Anticoagulants: The patient has                            taken no previous anticoagulant or antiplatelet                            agents. ASA Grade Assessment: III - A patient with                            severe systemic disease. After reviewing the risks  and benefits, the patient was deemed in                            satisfactory condition to undergo the procedure.                           - Sedation was administered by an anesthesia                            professional. Deep sedation was attained.                           After obtaining informed consent, the endoscope was                            passed under direct vision.  Throughout the                            procedure, the patient's blood pressure, pulse, and                            oxygen saturations were monitored continuously. The                            GIF-H190 ([id number] Olympus gastroscope was                            introduced through the mouth, and advanced to the                            second part of duodenum. The upper GI endoscopy was                            accomplished without difficulty. The patient                            tolerated the procedure well. Findings:      No endoscopic abnormality was evident in the esophagus to explain the       patient's complaint of dysphagia. It was decided, however, to proceed       with dilation of the entire esophagus. A guidewire was placed and the       scope was withdrawn. Dilation was performed with a Savary dilator with       no resistance at 16 mm. The dilation site was examined following       endoscope reinsertion and showed no change. Estimated blood loss: none.      A 2 cm hiatal hernia was present.      The stomach was normal.      The examined duodenum was normal.      Stasis of fluid was again noted in the distal esophagus. The mid and       distal esophagus was patulous. Impression:               - No endoscopic esophageal abnormality to explain  patient's dysphagia. Esophagus dilated. Dilated.                           - 2 cm hiatal hernia.                           - Normal stomach.                           - Normal examined duodenum.                           - No specimens collected. Moderate Sedation:      Not Applicable - Patient had care per Anesthesia. Recommendation:           - Patient has a contact number available for                            emergencies. The signs and symptoms of potential                            delayed complications were discussed with the                            patient. Return to normal activities  tomorrow.                            Written discharge instructions were provided to the                            patient.                           - Resume previous diet.                           - Continue present medications.                           - Follow up with Dr. Collene Mares in one month. Procedure Code(s):        --- Professional ---                           [phone], Esophagogastroduodenoscopy, flexible,                            transoral; with insertion of guide wire followed by                            passage of dilator(s) through esophagus over guide                            wire Diagnosis Code(s):        --- Professional ---                           R13.10, Dysphagia, unspecified  K44.9, Diaphragmatic hernia without obstruction or                            gangrene CPT copyright 2019 [employer]. All rights reserved. The codes documented in this report are preliminary and upon coder review may  be revised to meet current compliance requirements. Carol Ada, MD Carol Ada, MD 03/11/2021 2:22:54 PM This report has been signed electronically. Number of Addenda: 0

## 2021-03-11 NOTE — Transfer of Care (Signed)
Immediate Anesthesia Transfer of Care Note  Patient: Deanna Cunningham  Procedure(s) Performed: ESOPHAGOGASTRODUODENOSCOPY (EGD) WITH PROPOFOL SAVORY DILATION  Patient Location: PACU and Endoscopy Unit  Anesthesia Type:MAC  Level of Consciousness: awake, alert  and patient cooperative  Airway & Oxygen Therapy: Patient Spontanous Breathing and Patient connected to face mask oxygen  Post-op Assessment: Report given to RN and Post -op Vital signs reviewed and stable  Post vital signs: Reviewed and stable  Last Vitals:  Vitals Value Taken Time  BP    Temp    Pulse    Resp    SpO2      Last Pain:  Vitals:   03/11/21 1256  TempSrc: Oral  PainSc: 0-No pain         Complications: No notable events documented.

## 2021-03-11 NOTE — H&P (Signed)
Christophe Louis HPI: This 75 year old black female presents to the office to discuss having an EGD with dilation. 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 Another EGD was done on 04/02/2020 which revealed a 3 cm hiatal hernia, whitish plaques in the esophagus consistent with esophageal candidiasis and a normal appearing stomach and duodenum. She was treated with 12 days of Fluconazole. She takes Pantoprazole 40 mg daily with good control.  She continues to have dysphagia with solids. She has a sense of early satiety. She has good appetite but she is concerned about weight loss. She reports to have lost 5 pounds in 2 months. She has 2 BM's per day with occasional loose stool. There is no obvious blood or mucus in the stool. She denies having any complaints of abdominal pain, nausea, vomiting or odynophagia. She denies having a family history of colon cancer, celiac sprue or IBD. Her last colonoscopy done on 08/05/2018 revealed diverticulosis in the entire colon, the right colon was not well visualized as the preparation wasextremely poor.   Past Medical History:  Diagnosis Date   Anemia    Anemia in chronic renal disease    Arthritis    rheumatoid   CKD (chronic kidney disease), stage III (HCC)    Dysphagia    GERD (gastroesophageal reflux disease)    Osteopenia 04/2018   T score -2.0 FRAX 4% / 0.9%   Renal disorder    Right bundle branch block 05/17/2013   Secondary hyperparathyroidism, renal ([place])    Syncope and collapse 05/17/2013    Past Surgical History:  Procedure Laterality Date   arthritis     ESOPHAGOGASTRODUODENOSCOPY N/A 02/20/2014   Procedure: ESOPHAGOGASTRODUODENOSCOPY (EGD);  Surgeon: [name], MD;  Location: [facility];  Service: Gastroenterology;  Laterality: N/A;   ESOPHAGOGASTRODUODENOSCOPY (EGD) WITH ESOPHAGEAL DILATION      ESOPHAGOGASTRODUODENOSCOPY (EGD) WITH PROPOFOL Left 02/04/2020   Procedure: ESOPHAGOGASTRODUODENOSCOPY (EGD) WITH PROPOFOL;  Surgeon: Carol Ada, MD;  Location: [place];  Service: Endoscopy;  Laterality: Left;   ESOPHAGOGASTRODUODENOSCOPY (EGD) WITH PROPOFOL N/A 02/27/2020   Procedure: ESOPHAGOGASTRODUODENOSCOPY (EGD) WITH PROPOFOL;  Surgeon: Carol Ada, MD;  [facility];  Service: Endoscopy;  Laterality: N/A;   ESOPHAGOGASTRODUODENOSCOPY (EGD) WITH PROPOFOL N/A 04/02/2020   Procedure: ESOPHAGOGASTRODUODENOSCOPY (EGD) WITH PROPOFOL;  Surgeon: Carol Ada, MD;  [facility];  Service: Endoscopy;  Laterality: N/A;   SAVORY DILATION N/A 02/04/2020   Procedure: SAVORY DILATION;  Surgeon: Carol Ada, MD;  Location: [place];  Service: Endoscopy;  Laterality: N/A;   SAVORY DILATION N/A 02/27/2020   Procedure: SAVORY DILATION;  Surgeon: Carol Ada, MD;  [facility];  Service: Endoscopy;  Laterality: N/A;   SAVORY DILATION N/A 04/02/2020   Procedure: SAVORY DILATION;  Surgeon: Carol Ada, MD;  [facility];  Service: Endoscopy;  Laterality: N/A;    Family History  Problem Relation Age of Onset   Congestive Heart Failure Mother    Diabetes Mother    Hypertension Mother    Diabetes Sister    Hypertension Sister    Cancer Brother        tumor removed from bladder   Diabetes Brother    Hypertension Brother     Social History:  reports that she has never smoked. She has never used smokeless tobacco. She reports that she does not drink alcohol and does  not use drugs.  Allergies:  Allergies  Allergen Reactions   Lactose Intolerance (Gi)     Medications: Scheduled: Continuous:  sodium chloride     lactated ringers 10 mL/hr (03/11/21 1306)    No results found for this or any previous visit (from the past 24 hour(s)).   No results found.  ROS:  As stated above in the HPI otherwise negative.  Blood pressure (!) 119/49, pulse  98, temperature 98 F (36.7 C), temperature source Oral, resp. rate 15, height 5\' 4"  (1.626 m), weight 45.4 kg, SpO2 100 %.    PE: Gen: NAD, Alert and Oriented HEENT:  [place]/AT, EOMI Neck: Supple, no LAD Lungs: CTA Bilaterally CV: RRR without M/G/R ABD: Soft, NTND, +BS Ext: No C/C/E  Assessment/Plan: 1) Dysphagia. 2) Weight loss.  Plan: 1) EGD with possible dilation.  Arcangel Minion D 03/11/2021, 1:10 PM

## 2021-03-11 NOTE — Discharge Instructions (Signed)

## 2021-03-17 ENCOUNTER — Encounter (HOSPITAL_COMMUNITY): Payer: Self-pay | Admitting: Gastroenterology

## 2022-01-16 ENCOUNTER — Encounter: Payer: Self-pay | Admitting: Podiatrist

## 2022-01-16 ENCOUNTER — Ambulatory Visit: Payer: Medicare PPO | Admitting: Podiatrist

## 2022-01-16 DIAGNOSIS — B351 Tinea unguium: Secondary | ICD-10-CM

## 2022-01-16 DIAGNOSIS — M216X9 Other acquired deformities of unspecified foot: Secondary | ICD-10-CM | POA: Diagnosis not present

## 2022-01-16 DIAGNOSIS — L84 Corns and callosities: Secondary | ICD-10-CM | POA: Diagnosis not present

## 2022-01-16 DIAGNOSIS — M79609 Pain in unspecified limb: Secondary | ICD-10-CM

## 2022-01-16 NOTE — Progress Notes (Signed)
?Chief Complaint  ?Patient presents with  ? Nail Problem  ?  Nail trim   ?  ? ?HPI: Patient is 76 y.o. female who presents today for elongated toenails which are thick and difficult to trim. She also has a couple of calluses on the bottom of her feet that are bothersome.  Deanna Cunningham has Rheumatoid arthritis and is unable to grip a nail trimmer to trim her own nails.  She also has discomfort with ambulation and in shoes due to her toenails.   ? ?Her primary care provider is Kristie Cowman, Md ?[address] ?[place],  [place] [address]  ? ?Patient Active Problem List  ? Diagnosis Date Noted  ? Rheumatoid arthritis ([place]) 02/07/2020  ? Esophageal stricture   ? Dysphagia   ? Protein-calorie malnutrition, severe 12/25/2019  ? Hypoalbuminemia   ? Pressure injury of coccygeal region, stage 2 ([place]) 12/24/2019  ? Esophageal dysmotility   ? Weight loss   ? Weakness   ? Chronic anemia 05/17/2013  ? GERD (gastroesophageal reflux disease) 05/17/2013  ? ? ?Current Outpatient Medications on File Prior to Visit  ?Medication Sig Dispense Refill  ? acetaminophen (TYLENOL) 500 MG tablet Take 500 mg by mouth every 6 (six) hours as needed for mild pain.    ? Multiple Vitamin (MULTIVITAMIN WITH MINERALS) TABS tablet Take 1 tablet by mouth daily. One-A-Day Women    ? Nutritional Supplements (FEEDING SUPPLEMENT, BOOST BREEZE,) LIQD Take 237 mLs by mouth in the morning and at bedtime.    ? Ophthalmic Irrigation Solution (EYE WASH OP) Apply 1 Dose to eye as needed (eye irritation).    ? pantoprazole (PROTONIX) 40 MG tablet Take 1 tablet (40 mg total) by mouth daily.    ? ?No current facility-administered medications on file prior to visit.  ? ? ?Allergies  ?Allergen Reactions  ? Lactose Intolerance (Gi)   ? ? ?Review of Systems ?No fevers, chills, nausea, muscle aches, no difficulty breathing, no calf pain, no chest pain or shortness of breath. ? ? ?Physical Exam ? ?GENERAL APPEARANCE: Alert, conversant. Appropriately groomed. No acute distress.   ? ?VASCULAR: Pedal pulses palpable 2/4 DP and PT bilateral.  Capillary refill time is immediate to all digits,  Proximal to distal cooling it warm to warm.  Digital perfusion adequate.  ? ?NEUROLOGIC: sensation is intact to 5.07 monofilament at 5/5 sites bilateral.  Light touch is intact bilateral, vibratory sensation intact bilateral ? ?MUSCULOSKELETAL: acceptable muscle strength, tone and stability bilateral. Prominent metatarsal heads with contracture of lesser digits noted bilateral.  Bunion deformities present bilateral but non painful.  ? ?DERMATOLOGIC: skin is warm, supple, and dry.  Color, texture, and turgor of skin within normal limits.  No open wounds are noted.  Pre ulcerative callus present submetatarsal 5 bilateral and submetatarsal 3 right.   ?Digital nails are thick, discolored, dystrophic, brittle with subungual debris present and clinically mycotic x 10.  Bilateral hallux nails are significantly thick and mycotic in appearance ? ? ? ?Assessment  ? ?  ICD-10-CM   ?1. Pain due to onychomycosis of nail  B35.1   ? M79.609   ?  ?2. Prominent metatarsal head, unspecified laterality  M21.6X9   ?  ?3. Callus of foot  L84   ?  ? ? ? ?Plan ? ?Discussed exam findings with the patient and recommended debridement.  Patient agreed.  This was carried out today with sterile nail nippers and a power burr without complication.  Paring of the calluses was also performed. Periodic  routine nail debridement and foot care recommended every 3 months or as needed for follow-up. ? ?

## 2022-01-16 NOTE — Patient Instructions (Signed)

## 2022-03-02 ENCOUNTER — Other Ambulatory Visit: Payer: Self-pay | Admitting: Gastroenterology

## 2022-03-02 ENCOUNTER — Other Ambulatory Visit (HOSPITAL_COMMUNITY): Payer: Self-pay | Admitting: Gastroenterology

## 2022-03-02 DIAGNOSIS — R1032 Left lower quadrant pain: Secondary | ICD-10-CM

## 2022-03-06 ENCOUNTER — Other Ambulatory Visit (HOSPITAL_COMMUNITY): Payer: Self-pay

## 2022-03-08 ENCOUNTER — Ambulatory Visit (HOSPITAL_COMMUNITY)
Admission: RE | Admit: 2022-03-08 | Discharge: 2022-03-08 | Disposition: A | Discharge status: Home / Self Care | Payer: Medicare PPO | Source: Ambulatory Visit | Attending: Gastroenterology | Admitting: Gastroenterology

## 2022-03-08 DIAGNOSIS — R1032 Left lower quadrant pain: Secondary | ICD-10-CM | POA: Insufficient documentation

## 2022-03-08 LAB — POCT I-STAT CREATININE: Creatinine, Ser: 0.8 mg/dL (ref 0.44–1.00)

## 2022-03-08 MED ORDER — IOHEXOL 300 MG/ML  SOLN
80.0000 mL | Freq: Once | INTRAMUSCULAR | Status: AC | PRN
  Administered 2022-03-08: 80 mL via INTRAVENOUS

## 2022-06-15 ENCOUNTER — Other Ambulatory Visit: Payer: Self-pay | Admitting: Gastroenterology

## 2022-08-18 ENCOUNTER — Encounter (HOSPITAL_COMMUNITY): Payer: Self-pay | Admitting: Gastroenterology

## 2022-08-25 ENCOUNTER — Encounter (HOSPITAL_COMMUNITY)
Admission: RE | Disposition: A | Discharge status: Home / Self Care | Payer: Self-pay | Source: Home / Self Care | Attending: Gastroenterology

## 2022-08-25 ENCOUNTER — Ambulatory Visit (HOSPITAL_BASED_OUTPATIENT_CLINIC_OR_DEPARTMENT_OTHER): Payer: Medicare PPO | Admitting: Anesthesiology

## 2022-08-25 ENCOUNTER — Ambulatory Visit (HOSPITAL_COMMUNITY)
Admission: RE | Admit: 2022-08-25 | Discharge: 2022-08-25 | Disposition: A | Discharge status: Home / Self Care | Payer: Medicare PPO | Attending: Gastroenterology | Admitting: Gastroenterology

## 2022-08-25 ENCOUNTER — Ambulatory Visit (HOSPITAL_COMMUNITY): Payer: Medicare PPO | Admitting: Anesthesiology

## 2022-08-25 ENCOUNTER — Other Ambulatory Visit: Payer: Self-pay

## 2022-08-25 DIAGNOSIS — K222 Esophageal obstruction: Secondary | ICD-10-CM | POA: Diagnosis not present

## 2022-08-25 DIAGNOSIS — N2581 Secondary hyperparathyroidism of renal origin: Secondary | ICD-10-CM | POA: Insufficient documentation

## 2022-08-25 DIAGNOSIS — N289 Disorder of kidney and ureter, unspecified: Secondary | ICD-10-CM | POA: Diagnosis not present

## 2022-08-25 DIAGNOSIS — K219 Gastro-esophageal reflux disease without esophagitis: Secondary | ICD-10-CM | POA: Insufficient documentation

## 2022-08-25 DIAGNOSIS — M069 Rheumatoid arthritis, unspecified: Secondary | ICD-10-CM | POA: Diagnosis not present

## 2022-08-25 DIAGNOSIS — I081 Rheumatic disorders of both mitral and tricuspid valves: Secondary | ICD-10-CM | POA: Diagnosis not present

## 2022-08-25 DIAGNOSIS — D631 Anemia in chronic kidney disease: Secondary | ICD-10-CM | POA: Diagnosis not present

## 2022-08-25 DIAGNOSIS — R1310 Dysphagia, unspecified: Secondary | ICD-10-CM | POA: Insufficient documentation

## 2022-08-25 DIAGNOSIS — K2289 Other specified disease of esophagus: Secondary | ICD-10-CM | POA: Diagnosis not present

## 2022-08-25 DIAGNOSIS — D649 Anemia, unspecified: Secondary | ICD-10-CM

## 2022-08-25 DIAGNOSIS — N183 Chronic kidney disease, stage 3 unspecified: Secondary | ICD-10-CM | POA: Diagnosis not present

## 2022-08-25 HISTORY — PX: ESOPHAGOGASTRODUODENOSCOPY (EGD) WITH PROPOFOL: SHX5813

## 2022-08-25 HISTORY — PX: SAVORY DILATION: SHX5439

## 2022-08-25 SURGERY — ESOPHAGOGASTRODUODENOSCOPY (EGD) WITH PROPOFOL
Anesthesia: Monitor Anesthesia Care

## 2022-08-25 MED ORDER — LIDOCAINE HCL (CARDIAC) PF 100 MG/5ML IV SOSY
PREFILLED_SYRINGE | INTRAVENOUS | Status: DC | PRN
  Administered 2022-08-25: 50 mg via INTRAVENOUS

## 2022-08-25 MED ORDER — PROPOFOL 500 MG/50ML IV EMUL
INTRAVENOUS | Status: DC | PRN
  Administered 2022-08-25: 100 ug/kg/min via INTRAVENOUS

## 2022-08-25 MED ORDER — LACTATED RINGERS IV SOLN: INTRAVENOUS | Status: DC

## 2022-08-25 MED ORDER — SODIUM CHLORIDE 0.9 % IV SOLN: INTRAVENOUS | Status: DC

## 2022-08-25 MED ORDER — PROPOFOL 10 MG/ML IV BOLUS
INTRAVENOUS | Status: DC | PRN
  Administered 2022-08-25: 40 mg via INTRAVENOUS

## 2022-08-25 SURGICAL SUPPLY — 15 items

## 2022-08-25 NOTE — H&P (Signed)
Deanna Cunningham HPI: Her last EGD was on 02/2021.  A 16 mm Savary dilation was performed and she reported feeling well.  Over the past 6 months she reports a 6 lbs weight loss and she attributes this to her dysphagia.  She denies any problems with vomiting.  Past Medical History:  Diagnosis Date   Anemia    Anemia in chronic renal disease    Arthritis    rheumatoid   CKD (chronic kidney disease), stage III (HCC)    Dysphagia    GERD (gastroesophageal reflux disease)    Osteopenia 04/2018   T score -2.0 FRAX 4% / 0.9%   Renal disorder    Right bundle branch block 05/17/2013   Secondary hyperparathyroidism, renal ([place])    Syncope and collapse 05/17/2013    Past Surgical History:  Procedure Laterality Date   arthritis     ESOPHAGOGASTRODUODENOSCOPY N/A 02/20/2014   Procedure: ESOPHAGOGASTRODUODENOSCOPY (EGD);  Surgeon: [name], MD;  [facility];  Service: Gastroenterology;  Laterality: N/A;   ESOPHAGOGASTRODUODENOSCOPY (EGD) WITH ESOPHAGEAL DILATION     ESOPHAGOGASTRODUODENOSCOPY (EGD) WITH PROPOFOL Left 02/04/2020   Procedure: ESOPHAGOGASTRODUODENOSCOPY (EGD) WITH PROPOFOL;  Surgeon: Carol Ada, MD;  Location: [place];  Service: Endoscopy;  Laterality: Left;   ESOPHAGOGASTRODUODENOSCOPY (EGD) WITH PROPOFOL N/A 02/27/2020   Procedure: ESOPHAGOGASTRODUODENOSCOPY (EGD) WITH PROPOFOL;  Surgeon: Carol Ada, MD;  [facility];  Service: Endoscopy;  Laterality: N/A;   ESOPHAGOGASTRODUODENOSCOPY (EGD) WITH PROPOFOL N/A 04/02/2020   Procedure: ESOPHAGOGASTRODUODENOSCOPY (EGD) WITH PROPOFOL;  Surgeon: Carol Ada, MD;  [facility];  Service: Endoscopy;  Laterality: N/A;   ESOPHAGOGASTRODUODENOSCOPY (EGD) WITH PROPOFOL N/A 03/11/2021   Procedure: ESOPHAGOGASTRODUODENOSCOPY (EGD) WITH PROPOFOL;  Surgeon: Carol Ada, MD;  [facility];  Service: Endoscopy;  Laterality: N/A;   SAVORY DILATION N/A 02/04/2020   Procedure: SAVORY DILATION;  Surgeon:  Carol Ada, MD;  Location: [facility];  Service: Endoscopy;  Laterality: N/A;   SAVORY DILATION N/A 02/27/2020   Procedure: SAVORY DILATION;  Surgeon: Carol Ada, MD;  [facility];  Service: Endoscopy;  Laterality: N/A;   SAVORY DILATION N/A 04/02/2020   Procedure: SAVORY DILATION;  Surgeon: Carol Ada, MD;  [facility];  Service: Endoscopy;  Laterality: N/A;   SAVORY DILATION N/A 03/11/2021   Procedure: SAVORY DILATION;  Surgeon: Carol Ada, MD;  [facility];  Service: Endoscopy;  Laterality: N/A;    Family History  Problem Relation Age of Onset   Congestive Heart Failure Mother    Diabetes Mother    Hypertension Mother    Diabetes Sister    Hypertension Sister    Cancer Brother        tumor removed from bladder   Diabetes Brother    Hypertension Brother     Social History:  reports that she has never smoked. She has never used smokeless tobacco. She reports that she does not drink alcohol and does not use drugs.  Allergies:  Allergies  Allergen Reactions   Lactose Intolerance (Gi) Nausea And Vomiting    Medications: Scheduled: Continuous:  lactated ringers 10 mL/hr at 08/25/22 0751    No results found for this or any previous visit (from the past 24 hour(s)).   No results found.  ROS:  As stated above in the HPI otherwise negative.  Blood pressure (!) 131/47, pulse 78, temperature 98.1 F (36.7 C), temperature source Tympanic, resp. rate 16, height '5\' 4"'$  (1.626 m), weight 39.9 kg, SpO2 98 %.    PE: Gen:  NAD, Alert and Oriented, very thin HEENT:  [place]/AT, EOMI Neck: Supple, no LAD Lungs: CTA Bilaterally CV: RRR without M/G/R ABD: Soft, NTND, +BS Ext: No C/C/E  Assessment/Plan: 1) EGD with Savary dilation.  Deanna Cunningham D 08/25/2022, 9:01 AM

## 2022-08-25 NOTE — Op Note (Signed)
[facility] Patient Name: Deanna Cunningham Procedure Date: 08/25/2022 MRN: [id number] Attending MD: Carol Ada , MD, [id number] Date of Birth: 1945/10/12 CSN: [id number] Age: 76 Admit Type: Outpatient Procedure:                Upper GI endoscopy Indications:              Dysphagia Providers:                Carol Ada, MD, Carlyn Reichert, RN, Fransico Setters                            [place], Technician Referring MD:              Medicines:                 Complications:            No immediate complications. Estimated Blood Loss:     Estimated blood loss: none. Procedure:                Pre-Anesthesia Assessment:                           - Prior to the procedure, a History and Physical                            was performed, and patient medications and                            allergies were reviewed. The patient's tolerance of                            previous anesthesia was also reviewed. The risks                            and benefits of the procedure and the sedation                            options and risks were discussed with the patient.                            All questions were answered, and informed consent                            was obtained. Prior Anticoagulants: The patient has                            taken no anticoagulant or antiplatelet agents. ASA                            Grade Assessment: III - A patient with severe                            systemic disease. After reviewing the risks and                            benefits, the patient  was deemed in satisfactory                            condition to undergo the procedure.                           - Sedation was administered by an anesthesia                            professional. Deep sedation was attained.                           After obtaining informed consent, the endoscope was                            passed under direct vision. Throughout the                             procedure, the patient's blood pressure, pulse, and                            oxygen saturations were monitored continuously. The                            [id number]-H190 ([id number]) Olympus endoscope was introduced                            through the mouth, and advanced to the second part                            of duodenum. The upper GI endoscopy was technically                            difficult and complex. The patient tolerated the                            procedure well. Scope In: Scope Out: Findings:      The lumen of the esophagus was severely dilated.      One benign-appearing, intrinsic mild stenosis was found at the       gastroesophageal junction. This stenosis measured 1.6 cm (inner       diameter) x less than one cm (in length). The stenosis was traversed. A       guidewire was placed and the scope was withdrawn. Dilation was performed       with a Savary dilator with no resistance at 18 mm. The dilation site was       examined following endoscope reinsertion and showed no change.      Diffuse, white plaques were found in the upper third of the esophagus       and in the middle third of the esophagus.      The stomach was normal.      The examined duodenum was normal.      In the esophagus the lumen was again noted to be massively dilated.       There was a significant amount of stasis of food  material in the       esophagus. The residual food material was suctioned and the patient's       head was elevated. There was a mild stenosis at the GE junction. An 18       mm Savary dilation was performed without difficulty and no mucosal tears       were created. In the gastric lumen there was evidence of a moderate       amount of residual food material. The residue was suctioned and the       majority of the lumen was cleared. The stomach exhibited a severe       J-shaped appearance, but it was also difficult to determine if she had a       large hiatal hernia. There  was no evidence of any pyloric channel       stricture and the duodenum was normal. Impression:               - Dilation in the entire esophagus.                           - Benign-appearing esophageal stenosis. Dilated.                           - Esophageal plaques were found, consistent with                            candidiasis.                           - Normal stomach.                           - Normal examined duodenum.                           - No specimens collected. Moderate Sedation:      Not Applicable - Patient had care per Anesthesia. Recommendation:           - Patient has a contact number available for                            emergencies. The signs and symptoms of potential                            delayed complications were discussed with the                            patient. Return to normal activities tomorrow.                            Written discharge instructions were provided to the                            patient.                           - Resume previous diet.                           -  Continue present medications.                           - Return to GI clinic in 4 weeks.                           - Diflucan (fluconazole) 200 mg PO daily for 2                            weeks. Procedure Code(s):        --- Professional ---                           [phone], Esophagogastroduodenoscopy, flexible,                            transoral; with insertion of guide wire followed by                            passage of dilator(s) through esophagus over guide                            wire Diagnosis Code(s):        --- Professional ---                           K22.2, Esophageal obstruction                           K22.89, Other specified disease of esophagus                           K22.9, Disease of esophagus, unspecified                           R13.10, Dysphagia, unspecified CPT copyright 2022 [employer]. All rights  reserved. The codes documented in this report are preliminary and upon coder review may  be revised to meet current compliance requirements. Carol Ada, MD Carol Ada, MD 08/25/2022 9:36:47 AM This report has been signed electronically. Number of Addenda: 0

## 2022-08-25 NOTE — Anesthesia Preprocedure Evaluation (Signed)
Anesthesia Evaluation  Patient identified by MRN, date of birth, ID band Patient awake    Reviewed: Allergy & Precautions, NPO status , Patient's Chart, lab work & pertinent test results  Airway Mallampati: II  TM Distance: >3 FB Neck ROM: Full    Dental  (+) Edentulous Upper, Edentulous Lower, Dental Advisory Given   Pulmonary neg pulmonary ROS   Pulmonary exam normal breath sounds clear to auscultation       Cardiovascular Normal cardiovascular exam+ Valvular Problems/Murmurs (mod MR) MR  Rhythm:Regular Rate:Normal  TTE 2021  1. Left ventricular ejection fraction, by estimation, is 65 to 70%. The  left ventricle has normal function. The left ventricle has no regional  wall motion abnormalities. Left ventricular diastolic parameters are  indeterminate.   2. Right ventricular systolic function is normal. The right ventricular  size is normal. There is mildly elevated pulmonary artery systolic  pressure.   3. MR is eccentric an directed posterior into L atrium . Moderate mitral  valve regurgitation.   4. Tricuspid valve regurgitation is moderate.   5. The aortic valve is tricuspid. Aortic valve regurgitation is not  visualized. Mild aortic valve sclerosis is present, with no evidence of  aortic valve stenosis.   6. The inferior vena cava is normal in size with greater than 50%  respiratory variability, suggesting right atrial pressure of 3 mmHg.     Neuro/Psych negative neurological ROS  negative psych ROS   GI/Hepatic Neg liver ROS,GERD  ,,  Endo/Other  negative endocrine ROS    Renal/GU Renal InsufficiencyRenal diseaseLab Results      Component                Value               Date                      CREATININE               0.80                03/08/2022                BUN                      33 (H)              04/02/2020                NA                       138                 04/02/2020                K                         3.9                 04/02/2020                CL                       105                 04/02/2020                CO2  27                  04/02/2020             negative genitourinary   Musculoskeletal  (+) Arthritis , Rheumatoid disorders,    Abdominal   Peds  Hematology  (+) Blood dyscrasia, anemia   Anesthesia Other Findings   Reproductive/Obstetrics                             Anesthesia Physical Anesthesia Plan  ASA: 3  Anesthesia Plan: MAC   Post-op Pain Management:    Induction: Intravenous  PONV Risk Score and Plan: Propofol infusion and Treatment may vary due to age or medical condition  Airway Management Planned: Natural Airway  Additional Equipment:   Intra-op Plan:   Post-operative Plan:   Informed Consent: I have reviewed the patients History and Physical, chart, labs and discussed the procedure including the risks, benefits and alternatives for the proposed anesthesia with the patient or authorized representative who has indicated his/her understanding and acceptance.     Dental advisory given  Plan Discussed with: CRNA  Anesthesia Plan Comments:        Anesthesia Quick Evaluation

## 2022-08-25 NOTE — Transfer of Care (Signed)
Immediate Anesthesia Transfer of Care Note  Patient: Deanna Cunningham  Procedure(s) Performed: ESOPHAGOGASTRODUODENOSCOPY (EGD) WITH PROPOFOL SAVORY DILATION  Patient Location: Short Stay  Anesthesia Type:General  Level of Consciousness: awake, alert , and patient cooperative  Airway & Oxygen Therapy: Patient Spontanous Breathing  Post-op Assessment: Report given to RN, Post -op Vital signs reviewed and stable, and Patient moving all extremities X 4  Post vital signs: Reviewed and stable  Last Vitals:  Vitals Value Taken Time  BP 103/50 08/25/22 0934  Temp    Pulse    Resp 19 08/25/22 0934  SpO2    Vitals shown include unvalidated device data.  Last Pain:  Vitals:   08/25/22 0740  TempSrc: Tympanic  PainSc: 0-No pain         Complications: No notable events documented.

## 2022-08-25 NOTE — Discharge Instructions (Signed)
YOU HAD AN ENDOSCOPIC PROCEDURE TODAY: Refer to the procedure report and other information in the discharge instructions given to you for any specific questions about what was found during the examination. If this information does not answer your questions, please call [facility] at [phone] to clarify.   YOU SHOULD EXPECT: Some feelings of bloating in the abdomen. Passage of more gas than usual. Walking can help get rid of the air that was put into your GI tract during the procedure and reduce the bloating. If you had a lower endoscopy (such as a colonoscopy or flexible sigmoidoscopy) you may notice spotting of blood in your stool or on the toilet paper. Some abdominal soreness may be present for a day or two, also.  DIET: Your first meal following the procedure should be a light meal and then it is ok to progress to your normal diet. A half-sandwich or bowl of soup is an example of a good first meal. Heavy or fried foods are harder to digest and may make you feel nauseous or bloated. Drink plenty of fluids but you should avoid alcoholic beverages for 24 hours. If you had an esophageal dilation, please see attached information for diet.   ACTIVITY: Your care partner should take you home directly after the procedure. You should plan to take it easy, moving slowly for the rest of the day. You can resume normal activity the day after the procedure however YOU SHOULD NOT DRIVE, use power tools, machinery or perform tasks that involve climbing or major physical exertion for 24 hours (because of the sedation medicines used during the test).   SYMPTOMS TO REPORT IMMEDIATELY: A gastroenterologist can be reached at any hour. Please call [phone]  for any of the following symptoms:  Following lower endoscopy (colonoscopy, flexible sigmoidoscopy) Excessive amounts of blood in the stool  Significant tenderness, worsening of abdominal pains  Swelling of the abdomen that is new, acute  Fever of  100 or higher  Following upper endoscopy (EGD, EUS, ERCP, esophageal dilation) Vomiting of blood or coffee ground material  New, significant abdominal pain  New, significant chest pain or pain under the shoulder blades  Painful or persistently difficult swallowing  New shortness of breath  Black, tarry-looking or red, bloody stools  FOLLOW UP:  If any biopsies were taken you will be contacted by phone or by letter within the next 1-3 weeks. Call [phone]  if you have not heard about the biopsies in 3 weeks.  Please also call with any specific questions about appointments or follow up tests. YOU HAD AN ENDOSCOPIC PROCEDURE TODAY: Refer to the procedure report and other information in the discharge instructions given to you for any specific questions about what was found during the examination. If this information does not answer your questions, please call [facility] at [phone] to clarify.   YOU SHOULD EXPECT: Some feelings of bloating in the abdomen. Passage of more gas than usual. Walking can help get rid of the air that was put into your GI tract during the procedure and reduce the bloating. If you had a lower endoscopy (such as a colonoscopy or flexible sigmoidoscopy) you may notice spotting of blood in your stool or on the toilet paper. Some abdominal soreness may be present for a day or two, also.  DIET: Your first meal following the procedure should be a light meal and then it is ok to progress to your normal diet. A half-sandwich or bowl of soup is an example   of a good first meal. Heavy or fried foods are harder to digest and may make you feel nauseous or bloated. Drink plenty of fluids but you should avoid alcoholic beverages for 24 hours. If you had an esophageal dilation, please see attached information for diet.   ACTIVITY: Your care partner should take you home directly after the procedure. You should plan to take it easy, moving slowly for the rest of the day. You  can resume normal activity the day after the procedure however YOU SHOULD NOT DRIVE, use power tools, machinery or perform tasks that involve climbing or major physical exertion for 24 hours (because of the sedation medicines used during the test).   SYMPTOMS TO REPORT IMMEDIATELY: A gastroenterologist can be reached at any hour. Please call [phone]  for any of the following symptoms:  Following lower endoscopy (colonoscopy, flexible sigmoidoscopy) Excessive amounts of blood in the stool  Significant tenderness, worsening of abdominal pains  Swelling of the abdomen that is new, acute  Fever of 100 or higher  Following upper endoscopy (EGD, EUS, ERCP, esophageal dilation) Vomiting of blood or coffee ground material  New, significant abdominal pain  New, significant chest pain or pain under the shoulder blades  Painful or persistently difficult swallowing  New shortness of breath  Black, tarry-looking or red, bloody stools  FOLLOW UP:  If any biopsies were taken you will be contacted by phone or by letter within the next 1-3 weeks. Call [phone]  if you have not heard about the biopsies in 3 weeks.  Please also call with any specific questions about appointments or follow up tests.  

## 2022-08-27 NOTE — Anesthesia Postprocedure Evaluation (Signed)
Anesthesia Post Note  Patient: Deanna Cunningham  Procedure(s) Performed: ESOPHAGOGASTRODUODENOSCOPY (EGD) WITH PROPOFOL SAVORY DILATION     Patient location during evaluation: Endoscopy Anesthesia Type: MAC Level of consciousness: awake and alert Pain management: pain level controlled Vital Signs Assessment: post-procedure vital signs reviewed and stable Respiratory status: spontaneous breathing, nonlabored ventilation, respiratory function stable and patient connected to nasal cannula oxygen Cardiovascular status: blood pressure returned to baseline and stable Postop Assessment: no apparent nausea or vomiting Anesthetic complications: no  No notable events documented.  Last Vitals:  Vitals:   08/25/22 0940 08/25/22 0950  BP: (!) 104/50 121/81  Pulse: 83 (!) 105  Resp: 14 12  Temp:    SpO2: 97% 100%    Last Pain:  Vitals:   08/25/22 0940  TempSrc:   PainSc: 0-No pain                 Sherronda Sweigert L Mychele Seyller

## 2022-08-28 ENCOUNTER — Encounter (HOSPITAL_COMMUNITY): Payer: Self-pay | Admitting: Gastroenterology

## 2022-12-25 ENCOUNTER — Other Ambulatory Visit (HOSPITAL_COMMUNITY): Payer: Self-pay | Admitting: *Deleted

## 2022-12-26 ENCOUNTER — Encounter (HOSPITAL_COMMUNITY)
Admission: RE | Admit: 2022-12-26 | Discharge: 2022-12-26 | Disposition: A | Discharge status: Home / Self Care | Payer: Medicare PPO | Source: Ambulatory Visit | Attending: Nephrology | Admitting: Nephrology

## 2022-12-26 DIAGNOSIS — D631 Anemia in chronic kidney disease: Secondary | ICD-10-CM | POA: Diagnosis present

## 2022-12-26 DIAGNOSIS — N189 Chronic kidney disease, unspecified: Secondary | ICD-10-CM | POA: Diagnosis not present

## 2022-12-26 MED ORDER — SODIUM CHLORIDE 0.9 % IV SOLN
510.0000 mg | INTRAVENOUS | Status: DC
  Administered 2022-12-26: 510 mg via INTRAVENOUS
  Filled 2022-12-26: qty 510

## 2023-01-02 ENCOUNTER — Encounter (HOSPITAL_COMMUNITY)
Admission: RE | Admit: 2023-01-02 | Discharge: 2023-01-02 | Disposition: A | Discharge status: Home / Self Care | Payer: Medicare PPO | Source: Ambulatory Visit | Attending: Nephrology | Admitting: Nephrology

## 2023-01-02 DIAGNOSIS — D631 Anemia in chronic kidney disease: Secondary | ICD-10-CM | POA: Diagnosis not present

## 2023-01-02 DIAGNOSIS — N189 Chronic kidney disease, unspecified: Secondary | ICD-10-CM | POA: Diagnosis not present

## 2023-01-02 MED ORDER — SODIUM CHLORIDE 0.9 % IV SOLN
510.0000 mg | INTRAVENOUS | Status: AC
  Administered 2023-01-02: 510 mg via INTRAVENOUS
  Filled 2023-01-02: qty 510

## 2023-01-22 ENCOUNTER — Ambulatory Visit: Payer: Medicare PPO | Admitting: Podiatry

## 2023-01-22 ENCOUNTER — Encounter: Payer: Self-pay | Admitting: Podiatry

## 2023-01-22 DIAGNOSIS — M069 Rheumatoid arthritis, unspecified: Secondary | ICD-10-CM | POA: Diagnosis not present

## 2023-01-22 DIAGNOSIS — M79609 Pain in unspecified limb: Secondary | ICD-10-CM

## 2023-01-22 DIAGNOSIS — L84 Corns and callosities: Secondary | ICD-10-CM

## 2023-01-22 DIAGNOSIS — M216X9 Other acquired deformities of unspecified foot: Secondary | ICD-10-CM | POA: Diagnosis not present

## 2023-01-22 DIAGNOSIS — B351 Tinea unguium: Secondary | ICD-10-CM

## 2023-01-22 NOTE — Progress Notes (Signed)
Chief Complaint  Patient presents with   Nail Problem     Routine foot care      HPI: Patient is 77 y.o. female who presents today for elongated toenails which are thick and difficult to trim. She also has a couple of calluses on the bottom of her feet that are bothersome.  Deanna Cunningham has Rheumatoid arthritis and is unable to grip a nail trimmer to trim her own nails.  She also has discomfort with ambulation and in shoes due to her toenails.    Her primary care provider is [facility], [facility] [address] [place],  [place] [address]   Patient Active Problem List   Diagnosis Date Noted   Rheumatoid arthritis (HCC) 02/07/2020   Esophageal stricture    Dysphagia    Protein-calorie malnutrition, severe 12/25/2019   Hypoalbuminemia    Pressure injury of coccygeal region, stage 2 (HCC) 12/24/2019   Esophageal dysmotility    Weight loss    Weakness    Chronic anemia 05/17/2013   GERD (gastroesophageal reflux disease) 05/17/2013    Current Outpatient Medications on File Prior to Visit  Medication Sig Dispense Refill   acetaminophen (TYLENOL) 500 MG tablet Take 500 mg by mouth every 6 (six) hours as needed for mild pain.     Emollient (CERAVE DAILY MOISTURIZING) LOTN Apply 1 application  topically at bedtime. Itch relief     Multiple Vitamin (MULTIVITAMIN WITH MINERALS) TABS tablet Take 1 tablet by mouth daily. One-A-Day Women     Naphazoline HCl (CLEAR EYES OP) Place 1 drop into both eyes daily as needed (Redness).     Nutritional Supplements (FEEDING SUPPLEMENT, BOOST BREEZE,) LIQD Take 237 mLs by mouth in the morning and at bedtime.     Ophthalmic Irrigation Solution (EYE WASH OP) Apply 1 Dose to eye as needed (eye irritation).     pantoprazole (PROTONIX) 40 MG tablet Take 1 tablet (40 mg total) by mouth daily.     triamcinolone cream (KENALOG) 0.1 % Apply 1 Application topically 2 (two) times daily.     No current facility-administered medications on file prior to visit.    Allergies   Allergen Reactions   Lactose Intolerance (Gi) Nausea And Vomiting    Review of Systems No fevers, chills, nausea, muscle aches, no difficulty breathing, no calf pain, no chest pain or shortness of breath.   Physical Exam  GENERAL APPEARANCE: Alert, conversant. Appropriately groomed. No acute distress.   VASCULAR: Pedal pulses palpable 2/4 DP and PT bilateral.  Capillary refill time is immediate to all digits,  Proximal to distal cooling it warm to warm.  Digital perfusion adequate.   NEUROLOGIC: sensation is intact to 5.07 monofilament at 5/5 sites bilateral.  Light touch is intact bilateral, vibratory sensation intact bilateral  MUSCULOSKELETAL: acceptable muscle strength, tone and stability bilateral. Prominent metatarsal heads with contracture of lesser digits noted bilateral.  Bunion deformities present bilateral but non painful.   DERMATOLOGIC: skin is warm, supple, and dry.  Color, texture, and turgor of skin within normal limits.  No open wounds are noted.  Pre ulcerative callus present submetatarsal 5 bilateral and submetatarsal 3 right.   Digital nails are thick, discolored, dystrophic, brittle with subungual debris present and clinically mycotic x 10.  Bilateral hallux nails are significantly thick and mycotic in appearance    Assessment     ICD-10-CM   1. Pain due to onychomycosis of nail  B35.1 Routine Nail Trim   M79.609     2. Callus of foot  L84 Corn or Callus Trim Routine    3. Prominent metatarsal head, unspecified laterality  M21.6X9     4. Rheumatoid arthritis, involving unspecified site, unspecified whether rheumatoid factor present (HCC)  M06.9 Routine Nail Trim    Corn or Callus Trim Routine        Plan  -Discussed and educated patient on diabetic foot care, especially with  regards to the vascular, neurological and musculoskeletal systems.  -Stressed the importance of good glycemic control and the detriment of not  controlling glucose levels in  relation to the foot. -Discussed supportive shoes at all times and checking feet regularly.  -Mechanically debrided all nails 1-5 bilateral using sterile nail nipper and filed with dremel without incident  -Hyperkeratotic tissue debrided without incident with #15 blade.  -Answered all patient questions -Patient to return  in 3 months for at risk foot care -Patient advised to call the office if any problems or questions arise in the meantime.

## 2023-04-25 ENCOUNTER — Ambulatory Visit: Payer: Medicare PPO | Admitting: Podiatry

## 2023-04-25 ENCOUNTER — Encounter: Payer: Self-pay | Admitting: Podiatry

## 2023-04-25 DIAGNOSIS — M069 Rheumatoid arthritis, unspecified: Secondary | ICD-10-CM | POA: Diagnosis not present

## 2023-04-25 DIAGNOSIS — B351 Tinea unguium: Secondary | ICD-10-CM | POA: Diagnosis not present

## 2023-04-25 DIAGNOSIS — M79609 Pain in unspecified limb: Secondary | ICD-10-CM | POA: Diagnosis not present

## 2023-04-25 DIAGNOSIS — L84 Corns and callosities: Secondary | ICD-10-CM

## 2023-04-25 NOTE — Progress Notes (Signed)
No chief complaint on file.    HPI: Patient is 77 y.o. female who presents today for elongated toenails which are thick and difficult to trim. She also has a couple of calluses on the bottom of her feet that are bothersome.  Deanna Cunningham has Rheumatoid arthritis and is unable to grip a nail trimmer to trim her own nails.  She also has discomfort with ambulation and in shoes due to her toenails.    Her primary care provider is Knox Royalty, Md [address] [place],  [place] [address]   Patient Active Problem List   Diagnosis Date Noted   Rheumatoid arthritis (HCC) 02/07/2020   Esophageal stricture    Dysphagia    Protein-calorie malnutrition, severe 12/25/2019   Hypoalbuminemia    Pressure injury of coccygeal region, stage 2 (HCC) 12/24/2019   Esophageal dysmotility    Weight loss    Weakness    Chronic anemia 05/17/2013   GERD (gastroesophageal reflux disease) 05/17/2013    Current Outpatient Medications on File Prior to Visit  Medication Sig Dispense Refill   acetaminophen (TYLENOL) 500 MG tablet Take 500 mg by mouth every 6 (six) hours as needed for mild pain.     Emollient (CERAVE DAILY MOISTURIZING) LOTN Apply 1 application  topically at bedtime. Itch relief     Multiple Vitamin (MULTIVITAMIN WITH MINERALS) TABS tablet Take 1 tablet by mouth daily. One-A-Day Women     Naphazoline HCl (CLEAR EYES OP) Place 1 drop into both eyes daily as needed (Redness).     Nutritional Supplements (FEEDING SUPPLEMENT, BOOST BREEZE,) LIQD Take 237 mLs by mouth in the morning and at bedtime.     Ophthalmic Irrigation Solution (EYE WASH OP) Apply 1 Dose to eye as needed (eye irritation).     pantoprazole (PROTONIX) 40 MG tablet Take 1 tablet (40 mg total) by mouth daily.     triamcinolone cream (KENALOG) 0.1 % Apply 1 Application topically 2 (two) times daily.     No current facility-administered medications on file prior to visit.    Allergies  Allergen Reactions   Lactose Intolerance (Gi) Nausea  And Vomiting    Review of Systems No fevers, chills, nausea, muscle aches, no difficulty breathing, no calf pain, no chest pain or shortness of breath.   Physical Exam  GENERAL APPEARANCE: Alert, conversant. Appropriately groomed. No acute distress.   VASCULAR: Pedal pulses palpable 2/4 DP and PT bilateral.  Capillary refill time is immediate to all digits,  Proximal to distal cooling it warm to warm.  Digital perfusion adequate.   NEUROLOGIC: sensation is intact to 5.07 monofilament at 5/5 sites bilateral.  Light touch is intact bilateral, vibratory sensation intact bilateral  MUSCULOSKELETAL: acceptable muscle strength, tone and stability bilateral. Prominent metatarsal heads with contracture of lesser digits noted bilateral.  Bunion deformities present bilateral but non painful.   DERMATOLOGIC: skin is warm, supple, and dry.  Color, texture, and turgor of skin within normal limits.  No open wounds are noted.  Pre ulcerative callus present submetatarsal 5 bilateral and submetatarsal 3 right.   Digital nails are thick, discolored, dystrophic, brittle with subungual debris present and clinically mycotic x 10.  Bilateral hallux nails are significantly thick and mycotic in appearance    Assessment     ICD-10-CM   1. Pain due to onychomycosis of nail  B35.1    M79.609     2. Callus of foot  L84     3. Rheumatoid arthritis, involving unspecified site, unspecified whether rheumatoid factor  present [facility])  M06.9          Plan  -Discussed and educated patient on diabetic foot care, especially with  regards to the vascular, neurological and musculoskeletal systems.  -Stressed the importance of good glycemic control and the detriment of not  controlling glucose levels in relation to the foot. -Discussed supportive shoes at all times and checking feet regularly.  -Mechanically debrided all nails 1-5 bilateral using sterile nail nipper and filed with dremel without incident   -Hyperkeratotic tissue debrided without incident with #15 blade.  -Answered all patient questions -Patient to return  in 3 months for at risk foot care -Patient advised to call the office if any problems or questions arise in the meantime.

## 2023-08-01 ENCOUNTER — Ambulatory Visit: Payer: Medicare PPO | Admitting: Podiatry

## 2023-08-01 DIAGNOSIS — B351 Tinea unguium: Secondary | ICD-10-CM

## 2023-08-01 DIAGNOSIS — L84 Corns and callosities: Secondary | ICD-10-CM

## 2023-08-01 DIAGNOSIS — M79609 Pain in unspecified limb: Secondary | ICD-10-CM

## 2023-08-01 DIAGNOSIS — M069 Rheumatoid arthritis, unspecified: Secondary | ICD-10-CM | POA: Diagnosis not present

## 2023-08-01 NOTE — Progress Notes (Signed)
No chief complaint on file.    HPI: Patient is 77 y.o. female who presents today for elongated toenails which are thick and difficult to trim. She also has a couple of calluses on the bottom of her feet that are bothersome.  Deanna Cunningham has Rheumatoid arthritis and is unable to grip a nail trimmer to trim her own nails.  She also has discomfort with ambulation and in shoes due to her toenails.    Her primary care provider is Knox Royalty, Md [address] [place],  [place] [address]   Patient Active Problem List   Diagnosis Date Noted   Rheumatoid arthritis (HCC) 02/07/2020   Esophageal stricture    Dysphagia    Protein-calorie malnutrition, severe 12/25/2019   Hypoalbuminemia    Pressure injury of coccygeal region, stage 2 (HCC) 12/24/2019   Esophageal dysmotility    Weight loss    Weakness    Chronic anemia 05/17/2013   GERD (gastroesophageal reflux disease) 05/17/2013    Current Outpatient Medications on File Prior to Visit  Medication Sig Dispense Refill   acetaminophen (TYLENOL) 500 MG tablet Take 500 mg by mouth every 6 (six) hours as needed for mild pain.     Emollient (CERAVE DAILY MOISTURIZING) LOTN Apply 1 application  topically at bedtime. Itch relief     Multiple Vitamin (MULTIVITAMIN WITH MINERALS) TABS tablet Take 1 tablet by mouth daily. One-A-Day Women     Naphazoline HCl (CLEAR EYES OP) Place 1 drop into both eyes daily as needed (Redness).     Nutritional Supplements (FEEDING SUPPLEMENT, BOOST BREEZE,) LIQD Take 237 mLs by mouth in the morning and at bedtime.     Ophthalmic Irrigation Solution (EYE WASH OP) Apply 1 Dose to eye as needed (eye irritation).     pantoprazole (PROTONIX) 40 MG tablet Take 1 tablet (40 mg total) by mouth daily.     triamcinolone cream (KENALOG) 0.1 % Apply 1 Application topically 2 (two) times daily.     No current facility-administered medications on file prior to visit.    Allergies  Allergen Reactions   Lactose Intolerance (Gi) Nausea  And Vomiting    Review of Systems No fevers, chills, nausea, muscle aches, no difficulty breathing, no calf pain, no chest pain or shortness of breath.   Physical Exam  GENERAL APPEARANCE: Alert, conversant. Appropriately groomed. No acute distress.   VASCULAR: Pedal pulses palpable 2/4 DP and PT bilateral.  Capillary refill time is immediate to all digits,  Proximal to distal cooling it warm to warm.  Digital perfusion adequate.   NEUROLOGIC: sensation is intact to 5.07 monofilament at 5/5 sites bilateral.  Light touch is intact bilateral, vibratory sensation intact bilateral  MUSCULOSKELETAL: acceptable muscle strength, tone and stability bilateral. Prominent metatarsal heads with contracture of lesser digits noted bilateral.  Bunion deformities present bilateral but non painful.   DERMATOLOGIC: skin is warm, supple, and dry.  Color, texture, and turgor of skin within normal limits.  No open wounds are noted.  Pre ulcerative callus present submetatarsal 5 bilateral and submetatarsal 3 right.   Digital nails are thick, discolored, dystrophic, brittle with subungual debris present and clinically mycotic x 10.  Bilateral hallux nails are significantly thick and mycotic in appearance    Assessment     ICD-10-CM   1. Pain due to onychomycosis of nail  B35.1    M79.609     2. Callus of foot  L84     3. Rheumatoid arthritis, involving unspecified site, unspecified whether rheumatoid factor  present [facility])  M06.9           Plan  -Discussed and educated patient on diabetic foot care, especially with  regards to the vascular, neurological and musculoskeletal systems.  -Stressed the importance of good glycemic control and the detriment of not  controlling glucose levels in relation to the foot. -Discussed supportive shoes at all times and checking feet regularly.  -Mechanically debrided all nails 1-5 bilateral using sterile nail nipper and filed with dremel without incident   -Hyperkeratotic tissue debrided without incident with #15 blade.  -Answered all patient questions -Patient to return  in 3 months for at risk foot care -Patient advised to call the office if any problems or questions arise in the meantime.

## 2023-09-11 ENCOUNTER — Other Ambulatory Visit (HOSPITAL_COMMUNITY): Payer: Self-pay | Admitting: *Deleted

## 2023-09-14 ENCOUNTER — Ambulatory Visit (HOSPITAL_COMMUNITY)
Admission: RE | Admit: 2023-09-14 | Discharge: 2023-09-14 | Disposition: A | Discharge status: Home / Self Care | Payer: Medicare PPO | Source: Ambulatory Visit | Attending: Nephrology | Admitting: Nephrology

## 2023-09-14 DIAGNOSIS — N189 Chronic kidney disease, unspecified: Secondary | ICD-10-CM | POA: Insufficient documentation

## 2023-09-14 DIAGNOSIS — D631 Anemia in chronic kidney disease: Secondary | ICD-10-CM | POA: Diagnosis not present

## 2023-09-14 MED ORDER — SODIUM CHLORIDE 0.9 % IV SOLN
510.0000 mg | INTRAVENOUS | Status: DC
  Administered 2023-09-14: 510 mg via INTRAVENOUS
  Filled 2023-09-14: qty 510

## 2023-09-24 ENCOUNTER — Encounter (HOSPITAL_COMMUNITY)
Admission: RE | Admit: 2023-09-24 | Discharge: 2023-09-24 | Disposition: A | Discharge status: Home / Self Care | Payer: Medicare PPO | Source: Ambulatory Visit | Attending: Nephrology | Admitting: Nephrology

## 2023-09-24 DIAGNOSIS — N189 Chronic kidney disease, unspecified: Secondary | ICD-10-CM | POA: Diagnosis present

## 2023-09-24 DIAGNOSIS — D631 Anemia in chronic kidney disease: Secondary | ICD-10-CM | POA: Diagnosis present

## 2023-09-24 MED ORDER — SODIUM CHLORIDE 0.9 % IV SOLN
510.0000 mg | INTRAVENOUS | Status: DC
  Administered 2023-09-24: 510 mg via INTRAVENOUS
  Filled 2023-09-24: qty 510

## 2023-09-26 ENCOUNTER — Other Ambulatory Visit: Payer: Self-pay | Admitting: Gastroenterology

## 2023-10-26 ENCOUNTER — Encounter (HOSPITAL_COMMUNITY): Payer: Self-pay | Admitting: Gastroenterology

## 2023-10-26 NOTE — Progress Notes (Signed)
 Attempted to obtain medical history for pre op call via telephone, unable to reach at this time. HIPAA compliant voicemail message left requesting return call to pre surgical testing department.

## 2023-11-02 ENCOUNTER — Other Ambulatory Visit: Payer: Self-pay

## 2023-11-02 ENCOUNTER — Ambulatory Visit (HOSPITAL_COMMUNITY)
Admission: RE | Admit: 2023-11-02 | Discharge: 2023-11-02 | Disposition: A | Discharge status: Home / Self Care | Payer: Medicare PPO | Attending: Gastroenterology | Admitting: Gastroenterology

## 2023-11-02 ENCOUNTER — Ambulatory Visit (HOSPITAL_COMMUNITY): Payer: Medicare PPO

## 2023-11-02 ENCOUNTER — Encounter (HOSPITAL_COMMUNITY)
Admission: RE | Disposition: A | Discharge status: Home / Self Care | Payer: Self-pay | Source: Home / Self Care | Attending: Gastroenterology

## 2023-11-02 DIAGNOSIS — D509 Iron deficiency anemia, unspecified: Secondary | ICD-10-CM | POA: Insufficient documentation

## 2023-11-02 DIAGNOSIS — R1310 Dysphagia, unspecified: Secondary | ICD-10-CM | POA: Insufficient documentation

## 2023-11-02 DIAGNOSIS — M1990 Unspecified osteoarthritis, unspecified site: Secondary | ICD-10-CM

## 2023-11-02 DIAGNOSIS — I340 Nonrheumatic mitral (valve) insufficiency: Secondary | ICD-10-CM

## 2023-11-02 DIAGNOSIS — N183 Chronic kidney disease, stage 3 unspecified: Secondary | ICD-10-CM | POA: Diagnosis not present

## 2023-11-02 DIAGNOSIS — D631 Anemia in chronic kidney disease: Secondary | ICD-10-CM | POA: Insufficient documentation

## 2023-11-02 DIAGNOSIS — K219 Gastro-esophageal reflux disease without esophagitis: Secondary | ICD-10-CM | POA: Diagnosis not present

## 2023-11-02 DIAGNOSIS — K2289 Other specified disease of esophagus: Secondary | ICD-10-CM | POA: Diagnosis not present

## 2023-11-02 DIAGNOSIS — K317 Polyp of stomach and duodenum: Secondary | ICD-10-CM | POA: Insufficient documentation

## 2023-11-02 HISTORY — PX: POLYPECTOMY: SHX5525

## 2023-11-02 HISTORY — PX: HEMOSTASIS CLIP PLACEMENT: SHX6857

## 2023-11-02 HISTORY — PX: ESOPHAGOGASTRODUODENOSCOPY (EGD) WITH PROPOFOL: SHX5813

## 2023-11-02 SURGERY — ESOPHAGOGASTRODUODENOSCOPY (EGD) WITH PROPOFOL
Anesthesia: Monitor Anesthesia Care

## 2023-11-02 MED ORDER — LIDOCAINE 2% (20 MG/ML) 5 ML SYRINGE
INTRAMUSCULAR | Status: DC | PRN
  Administered 2023-11-02: 100 mg via INTRAVENOUS

## 2023-11-02 MED ORDER — PROPOFOL 500 MG/50ML IV EMUL
INTRAVENOUS | Status: DC | PRN
  Administered 2023-11-02: 75 ug/kg/min via INTRAVENOUS

## 2023-11-02 MED ORDER — SODIUM CHLORIDE 0.9 % IV SOLN: INTRAVENOUS | Status: DC | PRN

## 2023-11-02 SURGICAL SUPPLY — 14 items

## 2023-11-02 NOTE — H&P (Signed)
Deanna Cunningham HPI: This 78 year old black female presents to the office for a 1 year follow up. She takes Pantoprazole 40 mg daily for acid reflux. She denies having any problems with acid reflux but has had some recurrent dysphagia. She denies having any problems with odynophagia. She has 2-3 BM's per day with occasional loose BM's. She denies using any artificial sweeteners. There is no obvious blood or mucus in the stool. She has a history of iron deficiency anemia. She had an iron infusion done on 09/14/2023, the second one is scheduled for September 24, 2023. She has good appetite. She has lost 9 pounds in 1 1/2 years and 20 pounds since August 2022. Her weight usually fluctuates between 83-85 lbs. She denies having any complaints of abdominal pain, nausea, or vomiting. She denies having a family history of colon cancer, celiac sprue or IBD. Her last colonoscopy done on 08/05/2018 revealed diverticulosis in the entire colon and the right colon was poorly visualized due to a poor preparation.  Past Medical History:  Diagnosis Date   Anemia    Anemia in chronic renal disease    Arthritis    rheumatoid   CKD (chronic kidney disease), stage III (HCC)    Dysphagia    GERD (gastroesophageal reflux disease)    Osteopenia 04/2018   T score -2.0 FRAX 4% / 0.9%   Renal disorder    Right bundle branch block 05/17/2013   Secondary hyperparathyroidism, renal (HCC)    Syncope and collapse 05/17/2013    Past Surgical History:  Procedure Laterality Date   arthritis     ESOPHAGOGASTRODUODENOSCOPY N/A 02/20/2014   Procedure: ESOPHAGOGASTRODUODENOSCOPY (EGD);  Surgeon: Theda Belfast, MD;  Location: [facility];  Service: Gastroenterology;  Laterality: N/A;   ESOPHAGOGASTRODUODENOSCOPY (EGD) WITH ESOPHAGEAL DILATION     ESOPHAGOGASTRODUODENOSCOPY (EGD) WITH PROPOFOL Left 02/04/2020   Procedure: ESOPHAGOGASTRODUODENOSCOPY (EGD) WITH PROPOFOL;  Surgeon: Jeani Hawking, MD;  Location: [facility];  Service:  Endoscopy;  Laterality: Left;   ESOPHAGOGASTRODUODENOSCOPY (EGD) WITH PROPOFOL N/A 02/27/2020   Procedure: ESOPHAGOGASTRODUODENOSCOPY (EGD) WITH PROPOFOL;  Surgeon: Jeani Hawking, MD;  Location: [facility] ENDOSCOPY;  Service: Endoscopy;  Laterality: N/A;   ESOPHAGOGASTRODUODENOSCOPY (EGD) WITH PROPOFOL N/A 04/02/2020   Procedure: ESOPHAGOGASTRODUODENOSCOPY (EGD) WITH PROPOFOL;  Surgeon: Jeani Hawking, MD;  Location: [facility] END[facility];  Service: Endoscopy;  Laterality: N/A;   ESOPHAGOGASTRODUODENOSCOPY (EGD) WITH PROPOFOL N/A 03/11/2021   Procedure: ESOPHAGOGASTRODUODENOSCOPY (EGD) WITH PROPOFOL;  Surgeon: Jeani Hawking, MD;  Location: [facility] ENDOSCOPY;  Service: Endoscopy;  Laterality: N/A;   ESOPHAGOGASTRODUODENOSCOPY (EGD) WITH PROPOFOL N/A 08/25/2022   Procedure: ESOPHAGOGASTRODUODENOSCOPY (EGD) WITH PROPOFOL;  Surgeon: Jeani Hawking, MD;  Location: [facility] ENDOSCOPY;  Service: Gastroenterology;  Laterality: N/A;   SAVORY DILATION N/A 02/04/2020   Procedure: SAVORY DILATION;  Surgeon: Jeani Hawking, MD;  Location: [facility] ENDOSCOPY;  Service: Endoscopy;  Laterality: N/A;   SAVORY DILATION N/A 02/27/2020   Procedure: SAVORY DILATION;  Surgeon: Jeani Hawking, MD;  Location: [facility] ENDOSCOPY;  Service: Endoscopy;  Laterality: N/A;   SAVORY DILATION N/A 04/02/2020   Procedure: SAVORY DILATION;  Surgeon: Jeani Hawking, MD;  Location: [facility] END[facility];  Service: Endoscopy;  Laterality: N/A;   SAVORY DILATION N/A 03/11/2021   Procedure: SAVORY DILATION;  Surgeon: Jeani Hawking, MD;  Location: [facility] ENDOSCOPY;  Service: Endoscopy;  Laterality: N/A;   SAVORY DILATION N/A 08/25/2022   Procedure: SAVORY DILATION;  Surgeon: Jeani Hawking, MD;  Location: [facility] ENDOSCOPY;  Service: Gastroenterology;  Laterality: N/A;    Family History  Problem Relation Age  of Onset   Congestive Heart Failure Mother    Diabetes Mother    Hypertension Mother    Diabetes Sister    Hypertension Sister    Cancer Brother        tumor removed from bladder   Diabetes  Brother    Hypertension Brother     Social History:  reports that she has never smoked. She has never used smokeless tobacco. She reports that she does not drink alcohol and does not use drugs.  Allergies:  Allergies  Allergen Reactions   Lactose Intolerance (Gi) Nausea And Vomiting    Medications: Scheduled: Continuous:  No results found for this or any previous visit (from the past 24 hours).   No results found.  ROS:  As stated above in the HPI otherwise negative.  Blood pressure (!) 121/51, pulse 80, temperature 97.7 F (36.5 C), temperature source Temporal, resp. rate 13, height 5\' 4"  (1.626 m), weight 37.6 kg, SpO2 95%.    PE: Gen: NAD, Alert and Oriented HEENT:  [place]/AT, EOMI Neck: Supple, no LAD Lungs: CTA Bilaterally CV: RRR without M/G/R ABD: Soft, NTND, +BS Ext: No C/C/E  Assessment/Plan: 1) IDA. 2) Chronic dysphagia - unchanged.  Plan: 1) EGD.  D[employer]elle [employer] D 11/02/2023, 8:24 AM

## 2023-11-02 NOTE — Discharge Instructions (Signed)

## 2023-11-02 NOTE — Op Note (Signed)
[facility] Patient Name: Deanna Cunningham Procedure Date: 11/02/2023 MRN: [id number] Attending MD: Jeani Hawking , MD, [id number] Date of Birth: [birth year] CSN: [id number] Age: 78 Admit Type: Outpatient Procedure:                Upper GI endoscopy Indications:              Iron deficiency anemia Providers:                Jeani Hawking, MD, Fransisca Connors, Rhodia Albright,                            Technician Referring MD:              Medicines:                Propofol per Anesthesia Complications:            No immediate complications. Estimated Blood Loss:     Estimated blood loss: none. Procedure:                Pre-Anesthesia Assessment:                           - Prior to the procedure, a History and Physical                            was performed, and patient medications and                            allergies were reviewed. The patient's tolerance of                            previous anesthesia was also reviewed. The risks                            and benefits of the procedure and the sedation                            options and risks were discussed with the patient.                            All questions were answered, and informed consent                            was obtained. Prior Anticoagulants: The patient has                            taken no anticoagulant or antiplatelet agents. ASA                            Grade Assessment: III - A patient with severe                            systemic disease. After reviewing the risks and  benefits, the patient was deemed in satisfactory                            condition to undergo the procedure.                           - Sedation was administered by an anesthesia                            professional. Deep sedation was attained.                           After obtaining informed consent, the endoscope was                            passed under direct vision.  Throughout the                            procedure, the patient's blood pressure, pulse, and                            oxygen saturations were monitored continuously. The                            [id number]-H190 ([id number]) Olympus endoscope was introduced                            through the mouth, and advanced to the second part                            of duodenum. The upper GI endoscopy was technically                            difficult and complex. The patient tolerated the                            procedure well. Scope In: Scope Out: Findings:      The lumen of the middle third of the esophagus and lower third of the       esophagus was moderately dilated.      Two 4 to 8 mm semi-pedunculated polyps with bleeding and stigmata of       recent bleeding were found on the greater curvature of the stomach and       in the gastric antrum. The polyp was removed with a hot snare. Resection       and retrieval were complete. To stop active bleeding, one hemostatic       clip was successfully placed (MR safe). Clip manufacturer: [employer]. There was no bleeding at the end of the procedure.      A large amount of food (residue) was found in the gastric fundus and on       the greater curvature of the stomach.      The examined duodenum was normal.      Again, the distal esophagus was noted to be torturous. There was a  significant amount of gastric contents in the distal esophagus and       stasis of the contents in this area. The GE junction was patent and       there was no evidence of achalasia. In the gastric lumen there was a       significant amount of retained gastric contents. The majority of the       contents were able to be evacuated. For unknown reasons the gastric       lumen was not able to be distended with insufflation. A large 8 mm       semi-pedunculated polyp was noted in the proximal portion of the greater       curvature. This was removed with a hot  snare, but there was still some       bleeding at the stalk. One hemoclip was placed to arrest the bleeding.       This polypp appeared to be an inflammatory polyp or an inflammatory       hyperplastic polyp. In the distal antrum a smaller nonbleeding polyp was       identified and this was removed with a cold snare. Impression:               - Dilation in the middle third of the esophagus and                            in the lower third of the esophagus.                           - Two gastric polyps. Resected and retrieved. Clip                            manufacturer: [employer]. Clip (MR safe) was                            placed.                           - A large amount of food (residue) in the stomach.                           - Normal examined duodenum. Moderate Sedation:      Not Applicable - Patient had care per Anesthesia. Recommendation:           - Patient has a contact number available for                            emergencies. The signs and symptoms of potential                            delayed complications were discussed with the                            patient. Return to normal activities tomorrow.                            Written discharge instructions were provided to the  patient.                           - Resume previous diet.                           - Continue present medications.                           - Await pathology results.                           - Follow up with Dr. Loreta Ave in one month. Procedure Code(s):        --- Professional ---                           [phone], 59, Esophagogastroduodenoscopy, flexible,                            transoral; with control of bleeding, any method                           43251, Esophagogastroduodenoscopy, flexible,                            transoral; with removal of tumor(s), polyp(s), or                            other lesion(s) by snare technique Diagnosis Code(s):         --- Professional ---                           K22.89, Other specified disease of esophagus                           K31.7, Polyp of stomach and duodenum                           D50.9, Iron deficiency anemia, unspecified CPT copyright 2022 American Medical Association. All rights reserved. The codes documented in this report are preliminary and upon coder review may  be revised to meet current compliance requirements. Jeani Hawking, MD Jeani Hawking, MD 11/02/2023 9:02:34 AM This report has been signed electronically. Number of Addenda: 0

## 2023-11-02 NOTE — Anesthesia Preprocedure Evaluation (Addendum)
Anesthesia Evaluation  Patient identified by MRN, date of birth, ID band Patient awake    Reviewed: Allergy & Precautions, NPO status , Patient's Chart, lab work & pertinent test results  Airway Mallampati: II  TM Distance: >3 FB Neck ROM: Full    Dental no notable dental hx. (+) Edentulous Upper, Edentulous Lower   Pulmonary neg pulmonary ROS   Pulmonary exam normal breath sounds clear to auscultation       Cardiovascular Normal cardiovascular exam+ Valvular Problems/Murmurs (mod MR) MR  Rhythm:Regular Rate:Normal  TTE 2021  1. Left ventricular ejection fraction, by estimation, is 65 to 70%. The  left ventricle has normal function. The left ventricle has no regional  wall motion abnormalities. Left ventricular diastolic parameters are  indeterminate.   2. Right ventricular systolic function is normal. The right ventricular  size is normal. There is mildly elevated pulmonary artery systolic  pressure.   3. MR is eccentric an directed posterior into L atrium . Moderate mitral  valve regurgitation.   4. Tricuspid valve regurgitation is moderate.   5. The aortic valve is tricuspid. Aortic valve regurgitation is not  visualized. Mild aortic valve sclerosis is present, with no evidence of  aortic valve stenosis.   6. The inferior vena cava is normal in size with greater than 50%  respiratory variability, suggesting right atrial pressure of 3 mmHg.     Neuro/Psych negative neurological ROS  negative psych ROS   GI/Hepatic Neg liver ROS,GERD  ,,dysphagia   Endo/Other  negative endocrine ROS    Renal/GU Renal InsufficiencyRenal diseaseLab Results      Component                Value               Date                      CREATININE               0.80                03/08/2022                BUN                      33 (H)              04/02/2020                NA                       138                 04/02/2020                 K                        3.9                 04/02/2020                CL                       105                 04/02/2020                CO2  27                  04/02/2020             negative genitourinary   Musculoskeletal  (+) Arthritis , Rheumatoid disorders,    Abdominal   Peds  Hematology  (+) Blood dyscrasia, anemia   Anesthesia Other Findings   Reproductive/Obstetrics                             Anesthesia Physical Anesthesia Plan  ASA: 3  Anesthesia Plan: MAC   Post-op Pain Management:    Induction: Intravenous  PONV Risk Score and Plan: Propofol infusion and Treatment may vary due to age or medical condition  Airway Management Planned: Natural Airway  Additional Equipment:   Intra-op Plan:   Post-operative Plan:   Informed Consent: I have reviewed the patients History and Physical, chart, labs and discussed the procedure including the risks, benefits and alternatives for the proposed anesthesia with the patient or authorized representative who has indicated his/her understanding and acceptance.     Dental advisory given  Plan Discussed with: CRNA  Anesthesia Plan Comments:        Anesthesia Quick Evaluation

## 2023-11-02 NOTE — Transfer of Care (Signed)
Immediate Anesthesia Transfer of Care Note  Patient: Deanna Cunningham  Procedure(s) Performed: ESOPHAGOGASTRODUODENOSCOPY (EGD) WITH PROPOFOL POLYPECTOMY HEMOSTASIS CLIP PLACEMENT  Patient Location: PACU and Endoscopy Unit  Anesthesia Type:MAC  Level of Consciousness: awake and alert   Airway & Oxygen Therapy: Patient Spontanous Breathing and Patient connected to nasal cannula oxygen  Post-op Assessment: Report given to RN and Post -op Vital signs reviewed and stable  Post vital signs: Reviewed and stable  Last Vitals:  Vitals Value Taken Time  BP 95/46 11/02/23 0901  Temp 36.2 C 11/02/23 0901  Pulse 82 11/02/23 0901  Resp 18 11/02/23 0901  SpO2 100 % 11/02/23 0901  Vitals shown include unfiled device data.  Last Pain:  Vitals:   11/02/23 0901  TempSrc: Temporal  PainSc: Asleep         Complications: No notable events documented.

## 2023-11-02 NOTE — Anesthesia Postprocedure Evaluation (Signed)
Anesthesia Post Note  Patient: Deanna Cunningham  Procedure(s) Performed: ESOPHAGOGASTRODUODENOSCOPY (EGD) WITH PROPOFOL POLYPECTOMY HEMOSTASIS CLIP PLACEMENT     Patient location during evaluation: PACU Anesthesia Type: MAC Level of consciousness: awake and alert Pain management: pain level controlled Vital Signs Assessment: post-procedure vital signs reviewed and stable Respiratory status: spontaneous breathing, nonlabored ventilation, respiratory function stable and patient connected to nasal cannula oxygen Cardiovascular status: stable and blood pressure returned to baseline Postop Assessment: no apparent nausea or vomiting Anesthetic complications: no   No notable events documented.  Last Vitals:  Vitals:   11/02/23 0920 11/02/23 0930  BP: (!) 113/53 (!) 112/54  Pulse: 75 75  Resp: 17 11  Temp:    SpO2: 99% 99%    Last Pain:  Vitals:   11/02/23 0930  TempSrc:   PainSc: 0-No pain                 [facility]

## 2023-11-05 ENCOUNTER — Encounter (HOSPITAL_COMMUNITY): Payer: Self-pay | Admitting: Gastroenterology

## 2023-11-05 ENCOUNTER — Ambulatory Visit: Payer: Medicare PPO | Admitting: Podiatry

## 2023-11-05 DIAGNOSIS — L84 Corns and callosities: Secondary | ICD-10-CM

## 2023-11-05 DIAGNOSIS — M79676 Pain in unspecified toe(s): Secondary | ICD-10-CM

## 2023-11-05 DIAGNOSIS — B351 Tinea unguium: Secondary | ICD-10-CM | POA: Diagnosis not present

## 2023-11-05 DIAGNOSIS — M069 Rheumatoid arthritis, unspecified: Secondary | ICD-10-CM

## 2023-11-05 NOTE — Progress Notes (Signed)
No chief complaint on file.    HPI: Patient is 78 y.o. female who presents today for elongated toenails which are thick and difficult to trim. She also has a couple of calluses on the bottom of her feet that are bothersome.  Deanna Cunningham has Rheumatoid arthritis and is unable to grip a nail trimmer to trim her own nails.  She also has discomfort with ambulation and in shoes due to her toenails.    Her primary care provider is Knox Royalty, Md [address] [place],  [place] [address]   Patient Active Problem List   Diagnosis Date Noted   Rheumatoid arthritis (HCC) 02/07/2020   Esophageal stricture    Dysphagia    Protein-calorie malnutrition, severe 12/25/2019   Hypoalbuminemia    Pressure injury of coccygeal region, stage 2 (HCC) 12/24/2019   Esophageal dysmotility    Weight loss    Weakness    Chronic anemia 05/17/2013   GERD (gastroesophageal reflux disease) 05/17/2013    Current Outpatient Medications on File Prior to Visit  Medication Sig Dispense Refill   acetaminophen (TYLENOL) 500 MG tablet Take 500 mg by mouth every 6 (six) hours as needed for mild pain.     Emollient (CERAVE DAILY MOISTURIZING) LOTN Apply 1 application  topically at bedtime. Itch relief     Multiple Vitamin (MULTIVITAMIN WITH MINERALS) TABS tablet Take 1 tablet by mouth daily. One-A-Day Women     Naphazoline HCl (CLEAR EYES OP) Place 1 drop into both eyes daily as needed (Redness).     Ophthalmic Irrigation Solution (EYE WASH OP) Apply 1 Dose to eye as needed (eye irritation).     pantoprazole (PROTONIX) 40 MG tablet Take 1 tablet (40 mg total) by mouth daily.     triamcinolone cream (KENALOG) 0.1 % Apply 1 Application topically 2 (two) times daily.     trolamine salicylate (ASPERCREME) 10 % cream Apply 1 Application topically as needed for muscle pain.     No current facility-administered medications on file prior to visit.    Allergies  Allergen Reactions   Lactose Intolerance (Gi) Nausea And Vomiting     Review of Systems No fevers, chills, nausea, muscle aches, no difficulty breathing, no calf pain, no chest pain or shortness of breath.   Physical Exam  GENERAL APPEARANCE: Alert, conversant. Appropriately groomed. No acute distress.   VASCULAR: Pedal pulses palpable 2/4 DP and PT bilateral.  Capillary refill time is immediate to all digits,  Proximal to distal cooling it warm to warm.  Digital perfusion adequate.   NEUROLOGIC: sensation is intact to 5.07 monofilament at 5/5 sites bilateral.  Light touch is intact bilateral, vibratory sensation intact bilateral  MUSCULOSKELETAL: acceptable muscle strength, tone and stability bilateral. Prominent metatarsal heads with contracture of lesser digits noted bilateral.  Bunion deformities present bilateral but non painful.   DERMATOLOGIC: skin is warm, supple, and dry.  Color, texture, and turgor of skin within normal limits.  No open wounds are noted.  Pre ulcerative callus present submetatarsal 5 bilateral and submetatarsal 3 right.   Digital nails are thick, discolored, dystrophic, brittle with subungual debris present and clinically mycotic x 10.  Bilateral hallux nails are significantly thick and mycotic in appearance    Assessment     ICD-10-CM   1. Pain due to onychomycosis of nail  B35.1    M79.609     2. Rheumatoid arthritis, involving unspecified site, unspecified whether rheumatoid factor present (HCC)  M06.9     3. Callus of foot  L84           Plan  -Discussed and educated patient on diabetic foot care, especially with  regards to the vascular, neurological and musculoskeletal systems.  -Stressed the importance of good glycemic control and the detriment of not  controlling glucose levels in relation to the foot. -Discussed supportive shoes at all times and checking feet regularly.  -Mechanically debrided all nails 1-5 bilateral using sterile nail nipper and filed with dremel without incident  -Hyperkeratotic  tissue debrided without incident with #15 blade to sub fifth metatarsal heads bilatearl and sub third metatarsal head on right. x3 -Answered all patient questions -Patient to return  in 3 months for at risk foot care -Patient advised to call the office if any problems or questions arise in the meantime.

## 2023-11-08 LAB — SURGICAL PATHOLOGY

## 2024-01-28 ENCOUNTER — Encounter: Payer: Self-pay | Admitting: Podiatry

## 2024-01-28 ENCOUNTER — Ambulatory Visit: Payer: Medicare PPO | Admitting: Podiatry

## 2024-01-28 DIAGNOSIS — M069 Rheumatoid arthritis, unspecified: Secondary | ICD-10-CM

## 2024-01-28 DIAGNOSIS — M79609 Pain in unspecified limb: Secondary | ICD-10-CM

## 2024-01-28 DIAGNOSIS — L84 Corns and callosities: Secondary | ICD-10-CM

## 2024-01-28 DIAGNOSIS — B351 Tinea unguium: Secondary | ICD-10-CM

## 2024-01-28 NOTE — Progress Notes (Signed)
 No chief complaint on file.    HPI: Patient is 78 y.o. female who presents today for elongated toenails which are thick and difficult to trim. She also has a couple of calluses on the bottom of her feet that are bothersome.  Deanna Cunningham has Rheumatoid arthritis and is unable to grip a nail trimmer to trim her own nails.  She also has discomfort with ambulation and in shoes due to her toenails.    Her primary care provider is Trellis Fries, Md [address] [place],  [place] [address]   Patient Active Problem List   Diagnosis Date Noted   Rheumatoid arthritis (HCC) 02/07/2020   Esophageal stricture    Dysphagia    Protein-calorie malnutrition, severe 12/25/2019   Hypoalbuminemia    Pressure injury of coccygeal region, stage 2 (HCC) 12/24/2019   Esophageal dysmotility    Weight loss    Weakness    Chronic anemia 05/17/2013   GERD (gastroesophageal reflux disease) 05/17/2013    Current Outpatient Medications on File Prior to Visit  Medication Sig Dispense Refill   acetaminophen  (TYLENOL ) 500 MG tablet Take 500 mg by mouth every 6 (six) hours as needed for mild pain.     Emollient (CERAVE DAILY MOISTURIZING) LOTN Apply 1 application  topically at bedtime. Itch relief     Multiple Vitamin (MULTIVITAMIN WITH MINERALS) TABS tablet Take 1 tablet by mouth daily. One-A-Day Women     Naphazoline HCl (CLEAR EYES OP) Place 1 drop into both eyes daily as needed (Redness).     Ophthalmic Irrigation Solution (EYE WASH OP) Apply 1 Dose to eye as needed (eye irritation).     pantoprazole  (PROTONIX ) 40 MG tablet Take 1 tablet (40 mg total) by mouth daily.     triamcinolone cream (KENALOG) 0.1 % Apply 1 Application topically 2 (two) times daily.     trolamine salicylate (ASPERCREME) 10 % cream Apply 1 Application topically as needed for muscle pain.     No current facility-administered medications on file prior to visit.    Allergies  Allergen Reactions   Lactose Intolerance (Gi) Nausea And Vomiting     Review of Systems No fevers, chills, nausea, muscle aches, no difficulty breathing, no calf pain, no chest pain or shortness of breath.   Physical Exam  GENERAL APPEARANCE: Alert, conversant. Appropriately groomed. No acute distress.   VASCULAR: Pedal pulses palpable 2/4 DP and PT bilateral.  Capillary refill time is immediate to all digits,  Proximal to distal cooling it warm to warm.  Digital perfusion adequate.   NEUROLOGIC: sensation is intact to 5.07 monofilament at 5/5 sites bilateral.  Light touch is intact bilateral, vibratory sensation intact bilateral  MUSCULOSKELETAL: acceptable muscle strength, tone and stability bilateral. Prominent metatarsal heads with contracture of lesser digits noted bilateral.  Bunion deformities present bilateral but non painful.   DERMATOLOGIC: skin is warm, supple, and dry.  Color, texture, and turgor of skin within normal limits.  No open wounds are noted.  Pre ulcerative callus present submetatarsal 5 bilateral and submetatarsal 3 right.   Digital nails are thick, discolored, dystrophic, brittle with subungual debris present and clinically mycotic x 10.  Bilateral hallux nails are significantly thick and mycotic in appearance    Assessment     ICD-10-CM   1. Pain due to onychomycosis of nail  B35.1    M79.609     2. Rheumatoid arthritis, involving unspecified site, unspecified whether rheumatoid factor present (HCC)  M06.9  Plan  -Discussed and educated patient on diabetic foot care, especially with  regards to the vascular, neurological and musculoskeletal systems.  -Stressed the importance of good glycemic control and the detriment of not  controlling glucose levels in relation to the foot. -Discussed supportive shoes at all times and checking feet regularly.  -Mechanically debrided all nails 1-5 bilateral using sterile nail nipper and filed with dremel without incident  -Hyperkeratotic tissue debrided without incident  with #15 blade to sub fifth metatarsal heads bilatearl and sub third metatarsal head on right. x3 -Answered all patient questions -Patient to return  in 3 months for at risk foot care -Patient advised to call the office if any problems or questions arise in the meantime.

## 2024-04-30 ENCOUNTER — Ambulatory Visit: Admitting: Podiatry

## 2024-04-30 ENCOUNTER — Encounter: Payer: Self-pay | Admitting: Podiatry

## 2024-04-30 DIAGNOSIS — L84 Corns and callosities: Secondary | ICD-10-CM

## 2024-04-30 DIAGNOSIS — M79609 Pain in unspecified limb: Secondary | ICD-10-CM | POA: Diagnosis not present

## 2024-04-30 DIAGNOSIS — M069 Rheumatoid arthritis, unspecified: Secondary | ICD-10-CM

## 2024-04-30 DIAGNOSIS — B351 Tinea unguium: Secondary | ICD-10-CM

## 2024-04-30 NOTE — Progress Notes (Signed)
 Chief Complaint  Patient presents with   Nail Problem    Trim my toenails.   Callouses    She does my calluses too.     HPI: Patient is 78 y.o. female who presents today for elongated toenails which are thick and difficult to trim. She also has a couple of calluses on the bottom of her feet that are bothersome.  Deanna Cunningham has Rheumatoid arthritis and is unable to grip a nail trimmer to trim her own nails.  She also has discomfort with ambulation and in shoes due to her toenails.    Her primary care provider is Joshua Francisco, Md [address] [place],  [place] [address]   Patient Active Problem List   Diagnosis Date Noted   Rheumatoid arthritis (HCC) 02/07/2020   Esophageal stricture    Dysphagia    Protein-calorie malnutrition, severe 12/25/2019   Hypoalbuminemia    Pressure injury of coccygeal region, stage 2 (HCC) 12/24/2019   Esophageal dysmotility    Weight loss    Weakness    Chronic anemia 05/17/2013   GERD (gastroesophageal reflux disease) 05/17/2013    Current Outpatient Medications on File Prior to Visit  Medication Sig Dispense Refill   acetaminophen  (TYLENOL ) 500 MG tablet Take 500 mg by mouth every 6 (six) hours as needed for mild pain.     Emollient (CERAVE DAILY MOISTURIZING) LOTN Apply 1 application  topically at bedtime. Itch relief     Multiple Vitamin (MULTIVITAMIN WITH MINERALS) TABS tablet Take 1 tablet by mouth daily. One-A-Day Women     Naphazoline HCl (CLEAR EYES OP) Place 1 drop into both eyes daily as needed (Redness).     Ophthalmic Irrigation Solution (EYE WASH OP) Apply 1 Dose to eye as needed (eye irritation).     pantoprazole  (PROTONIX ) 40 MG tablet Take 1 tablet (40 mg total) by mouth daily.     triamcinolone cream (KENALOG) 0.1 % Apply 1 Application topically 2 (two) times daily.     trolamine salicylate (ASPERCREME) 10 % cream Apply 1 Application topically as needed for muscle pain.     No current facility-administered medications on file prior  to visit.    Allergies  Allergen Reactions   Lactose Intolerance (Gi) Nausea And Vomiting    Review of Systems No fevers, chills, nausea, muscle aches, no difficulty breathing, no calf pain, no chest pain or shortness of breath.   Physical Exam  GENERAL APPEARANCE: Alert, conversant. Appropriately groomed. No acute distress.   VASCULAR: Pedal pulses palpable 2/4 DP and PT bilateral.  Capillary refill time is immediate to all digits,  Proximal to distal cooling it warm to warm.  Digital perfusion adequate.   NEUROLOGIC: sensation is intact to 5.07 monofilament at 5/5 sites bilateral.  Light touch is intact bilateral, vibratory sensation intact bilateral  MUSCULOSKELETAL: acceptable muscle strength, tone and stability bilateral. Prominent metatarsal heads with contracture of lesser digits noted bilateral.  Bunion deformities present bilateral but non painful.   DERMATOLOGIC: skin is warm, supple, and dry.  Color, texture, and turgor of skin within normal limits.  No open wounds are noted.  Pre ulcerative callus present submetatarsal 5 bilateral and submetatarsal 3 right.   Digital nails are thick, discolored, dystrophic, brittle with subungual debris present and clinically mycotic x 10.  Bilateral hallux nails are significantly thick and mycotic in appearance    Assessment     ICD-10-CM   1. Pain due to onychomycosis of nail  B35.1    M79.609  2. Rheumatoid arthritis, involving unspecified site, unspecified whether rheumatoid factor present (HCC)  M06.9     3. Callus of foot  L84           Plan  -Discussed and educated patient on diabetic foot care, especially with  regards to the vascular, neurological and musculoskeletal systems.  -Stressed the importance of good glycemic control and the detriment of not  controlling glucose levels in relation to the foot. -Discussed supportive shoes at all times and checking feet regularly.  -Mechanically debrided all nails 1-5  bilateral using sterile nail nipper and filed with dremel without incident  -Hyperkeratotic tissue debrided without incident with #15 blade to sub fifth metatarsal heads bilatearl and sub third metatarsal head on right. x3 -Answered all patient questions -Patient to return  in 3 months for at risk foot care -Patient advised to call the office if any problems or questions arise in the meantime.

## 2024-07-30 ENCOUNTER — Encounter: Payer: Self-pay | Admitting: Podiatry

## 2024-07-30 ENCOUNTER — Ambulatory Visit: Admitting: Podiatry

## 2024-07-30 DIAGNOSIS — M79609 Pain in unspecified limb: Secondary | ICD-10-CM | POA: Diagnosis not present

## 2024-07-30 DIAGNOSIS — B351 Tinea unguium: Secondary | ICD-10-CM | POA: Diagnosis not present

## 2024-07-30 DIAGNOSIS — L84 Corns and callosities: Secondary | ICD-10-CM

## 2024-07-30 DIAGNOSIS — M069 Rheumatoid arthritis, unspecified: Secondary | ICD-10-CM | POA: Diagnosis not present

## 2024-07-30 NOTE — Progress Notes (Signed)
 Chief Complaint  Patient presents with   Nail Problem    I'm here for a nail clipping.     HPI: Patient is 78 y.o. female who presents today for elongated toenails which are thick and difficult to trim. She also has a couple of calluses on the bottom of her feet that are bothersome.  Deanna Cunningham has Rheumatoid arthritis and is unable to grip a nail trimmer to trim her own nails.  She also has discomfort with ambulation and in shoes due to her toenails.    Her primary care provider is Joshua Francisco, Md [address] [place],  [place] [address]   Patient Active Problem List   Diagnosis Date Noted   CKD (chronic kidney disease) 06/23/2020   Rheumatoid arthritis (HCC) 02/07/2020   Esophageal stricture    Dysphagia    Protein-calorie malnutrition, severe 12/25/2019   Hypoalbuminemia    Pressure injury of coccygeal region, stage 2 (HCC) 12/24/2019   Esophageal dysmotility    Weight loss    Weakness    Chronic anemia 05/17/2013   GERD (gastroesophageal reflux disease) 05/17/2013    Current Outpatient Medications on File Prior to Visit  Medication Sig Dispense Refill   acetaminophen  (TYLENOL ) 500 MG tablet Take 500 mg by mouth every 6 (six) hours as needed for mild pain.     Emollient (CERAVE DAILY MOISTURIZING) LOTN Apply 1 application  topically at bedtime. Itch relief     Multiple Vitamin (MULTIVITAMIN WITH MINERALS) TABS tablet Take 1 tablet by mouth daily. One-A-Day Women     Naphazoline HCl (CLEAR EYES OP) Place 1 drop into both eyes daily as needed (Redness).     Ophthalmic Irrigation Solution (EYE WASH OP) Apply 1 Dose to eye as needed (eye irritation).     pantoprazole  (PROTONIX ) 40 MG tablet Take 1 tablet (40 mg total) by mouth daily.     triamcinolone cream (KENALOG) 0.1 % Apply 1 Application topically 2 (two) times daily.     trolamine salicylate (ASPERCREME) 10 % cream Apply 1 Application topically as needed for muscle pain.     No current facility-administered medications on  file prior to visit.    Allergies  Allergen Reactions   Lactose Intolerance (Gi) Nausea And Vomiting    Review of Systems No fevers, chills, nausea, muscle aches, no difficulty breathing, no calf pain, no chest pain or shortness of breath.   Physical Exam  GENERAL APPEARANCE: Alert, conversant. Appropriately groomed. No acute distress.   VASCULAR: Pedal pulses palpable 2/4 DP and PT bilateral.  Capillary refill time is immediate to all digits,  Proximal to distal cooling it warm to warm.  Digital perfusion adequate.   NEUROLOGIC: sensation is intact to 5.07 monofilament at 5/5 sites bilateral.  Light touch is intact bilateral, vibratory sensation intact bilateral  MUSCULOSKELETAL: acceptable muscle strength, tone and stability bilateral. Prominent metatarsal heads with contracture of lesser digits noted bilateral.  Bunion deformities present bilateral but non painful.   DERMATOLOGIC: skin is warm, supple, and dry.  Color, texture, and turgor of skin within normal limits.  No open wounds are noted.  Pre ulcerative callus present submetatarsal 5 bilateral and submetatarsal 3 right.   Digital nails are thick, discolored, dystrophic, brittle with subungual debris present and clinically mycotic x 10.  Bilateral hallux nails are significantly thick and mycotic in appearance    Assessment     ICD-10-CM   1. Pain due to onychomycosis of nail  B35.1    M79.609     2.  Rheumatoid arthritis, involving unspecified site, unspecified whether rheumatoid factor present (HCC)  M06.9     3. Callus of foot  L84           Plan  -Discussed and educated patient on diabetic foot care, especially with  regards to the vascular, neurological and musculoskeletal systems.  -Stressed the importance of good glycemic control and the detriment of not  controlling glucose levels in relation to the foot. -Discussed supportive shoes at all times and checking feet regularly.  -Mechanically debrided all  nails 1-5 bilateral using sterile nail nipper and filed with dremel without incident  -Hyperkeratotic tissue debrided without incident with #15 blade to sub fifth metatarsal heads bilatearl and sub third metatarsal head on right. x3 -Answered all patient questions -Patient to return  in 3 months for at risk foot care -Patient advised to call the office if any problems or questions arise in the meantime.

## 2024-10-29 ENCOUNTER — Ambulatory Visit: Admitting: Podiatry
